# Patient Record
Sex: Female | Born: 1935
Health system: Southern US, Community
[De-identification: ages and names within clinical notes are randomized; demographics above are authoritative.]

## PROBLEM LIST (undated history)

## (undated) DIAGNOSIS — F419 Anxiety disorder, unspecified: Secondary | ICD-10-CM

## (undated) DIAGNOSIS — E049 Nontoxic goiter, unspecified: Secondary | ICD-10-CM

## (undated) DIAGNOSIS — K219 Gastro-esophageal reflux disease without esophagitis: Secondary | ICD-10-CM

## (undated) DIAGNOSIS — M810 Age-related osteoporosis without current pathological fracture: Secondary | ICD-10-CM

## (undated) DIAGNOSIS — E559 Vitamin D deficiency, unspecified: Secondary | ICD-10-CM

## (undated) DIAGNOSIS — I10 Essential (primary) hypertension: Secondary | ICD-10-CM

## (undated) DIAGNOSIS — M1990 Unspecified osteoarthritis, unspecified site: Secondary | ICD-10-CM

## (undated) DIAGNOSIS — F0390 Unspecified dementia without behavioral disturbance: Secondary | ICD-10-CM

## (undated) HISTORY — PX: APPENDECTOMY: SHX54

## (undated) HISTORY — PX: TUBAL LIGATION: SHX77

## (undated) HISTORY — PX: OTHER SURGICAL HISTORY: SHX169

## (undated) HISTORY — PX: KIDNEY STONE SURGERY: SHX686

---

## 2000-10-20 ENCOUNTER — Ambulatory Visit (HOSPITAL_COMMUNITY): Admission: RE | Admit: 2000-10-20 | Discharge: 2000-10-20 | Payer: Self-pay | Admitting: Internal Medicine

## 2000-10-20 ENCOUNTER — Encounter: Payer: Self-pay | Admitting: Internal Medicine

## 2001-04-13 ENCOUNTER — Encounter: Payer: Self-pay | Admitting: Internal Medicine

## 2001-04-13 ENCOUNTER — Ambulatory Visit (HOSPITAL_COMMUNITY): Admission: RE | Admit: 2001-04-13 | Discharge: 2001-04-13 | Payer: Self-pay | Admitting: Internal Medicine

## 2002-06-08 ENCOUNTER — Encounter: Payer: Self-pay | Admitting: Internal Medicine

## 2002-06-08 ENCOUNTER — Ambulatory Visit (HOSPITAL_COMMUNITY): Admission: RE | Admit: 2002-06-08 | Discharge: 2002-06-08 | Payer: Self-pay | Admitting: Internal Medicine

## 2003-08-12 ENCOUNTER — Ambulatory Visit (HOSPITAL_COMMUNITY): Admission: RE | Admit: 2003-08-12 | Discharge: 2003-08-12 | Payer: Self-pay | Admitting: Internal Medicine

## 2003-12-20 ENCOUNTER — Ambulatory Visit: Payer: Self-pay | Admitting: Internal Medicine

## 2003-12-20 ENCOUNTER — Ambulatory Visit (HOSPITAL_COMMUNITY): Admission: RE | Admit: 2003-12-20 | Discharge: 2003-12-20 | Payer: Self-pay | Admitting: Internal Medicine

## 2004-08-21 ENCOUNTER — Ambulatory Visit (HOSPITAL_COMMUNITY): Admission: RE | Admit: 2004-08-21 | Discharge: 2004-08-21 | Payer: Self-pay | Admitting: Internal Medicine

## 2005-09-19 ENCOUNTER — Ambulatory Visit (HOSPITAL_COMMUNITY): Admission: RE | Admit: 2005-09-19 | Discharge: 2005-09-19 | Payer: Self-pay | Admitting: Internal Medicine

## 2005-12-04 ENCOUNTER — Ambulatory Visit (HOSPITAL_COMMUNITY): Admission: RE | Admit: 2005-12-04 | Discharge: 2005-12-04 | Payer: Self-pay | Admitting: Internal Medicine

## 2006-09-24 ENCOUNTER — Ambulatory Visit (HOSPITAL_COMMUNITY): Admission: RE | Admit: 2006-09-24 | Discharge: 2006-09-24 | Payer: Self-pay | Admitting: Internal Medicine

## 2007-10-09 ENCOUNTER — Ambulatory Visit (HOSPITAL_COMMUNITY): Admission: RE | Admit: 2007-10-09 | Discharge: 2007-10-09 | Payer: Self-pay | Admitting: Internal Medicine

## 2008-02-17 ENCOUNTER — Ambulatory Visit (HOSPITAL_COMMUNITY): Admission: RE | Admit: 2008-02-17 | Discharge: 2008-02-17 | Payer: Self-pay | Admitting: Internal Medicine

## 2008-10-01 ENCOUNTER — Inpatient Hospital Stay (HOSPITAL_COMMUNITY): Admission: EM | Admit: 2008-10-01 | Discharge: 2008-10-03 | Payer: Self-pay | Admitting: Emergency Medicine

## 2008-10-11 ENCOUNTER — Ambulatory Visit (HOSPITAL_COMMUNITY): Admission: RE | Admit: 2008-10-11 | Discharge: 2008-10-11 | Payer: Self-pay | Admitting: Internal Medicine

## 2008-10-17 ENCOUNTER — Ambulatory Visit (HOSPITAL_COMMUNITY): Admission: RE | Admit: 2008-10-17 | Discharge: 2008-10-17 | Payer: Self-pay | Admitting: Internal Medicine

## 2008-11-02 ENCOUNTER — Encounter: Admission: RE | Admit: 2008-11-02 | Discharge: 2008-11-02 | Payer: Self-pay | Admitting: Internal Medicine

## 2008-11-02 ENCOUNTER — Other Ambulatory Visit: Admission: RE | Admit: 2008-11-02 | Discharge: 2008-11-02 | Payer: Self-pay | Admitting: Interventional Radiology

## 2008-11-02 ENCOUNTER — Encounter (INDEPENDENT_AMBULATORY_CARE_PROVIDER_SITE_OTHER): Payer: Self-pay | Admitting: Interventional Radiology

## 2009-04-13 ENCOUNTER — Ambulatory Visit (HOSPITAL_COMMUNITY): Admission: RE | Admit: 2009-04-13 | Discharge: 2009-04-13 | Payer: Self-pay | Admitting: Internal Medicine

## 2009-09-13 ENCOUNTER — Ambulatory Visit (HOSPITAL_COMMUNITY): Admission: RE | Admit: 2009-09-13 | Discharge: 2009-09-13 | Payer: Self-pay | Admitting: Internal Medicine

## 2009-10-23 ENCOUNTER — Ambulatory Visit (HOSPITAL_COMMUNITY): Admission: RE | Admit: 2009-10-23 | Discharge: 2009-10-23 | Payer: Self-pay | Admitting: Internal Medicine

## 2009-12-25 ENCOUNTER — Ambulatory Visit (HOSPITAL_COMMUNITY): Admission: RE | Admit: 2009-12-25 | Discharge: 2009-12-25 | Payer: Self-pay | Admitting: Internal Medicine

## 2010-03-24 ENCOUNTER — Emergency Department (HOSPITAL_COMMUNITY): Payer: Medicare Other

## 2010-03-24 ENCOUNTER — Emergency Department (HOSPITAL_COMMUNITY)
Admission: EM | Admit: 2010-03-24 | Discharge: 2010-03-24 | Disposition: A | Discharge status: Home / Self Care | Payer: Medicare Other | Attending: Emergency Medicine | Admitting: Emergency Medicine

## 2010-03-24 DIAGNOSIS — H5789 Other specified disorders of eye and adnexa: Secondary | ICD-10-CM | POA: Insufficient documentation

## 2010-03-24 DIAGNOSIS — Z79899 Other long term (current) drug therapy: Secondary | ICD-10-CM | POA: Insufficient documentation

## 2010-03-24 DIAGNOSIS — H1130 Conjunctival hemorrhage, unspecified eye: Secondary | ICD-10-CM | POA: Insufficient documentation

## 2010-03-24 DIAGNOSIS — I10 Essential (primary) hypertension: Secondary | ICD-10-CM | POA: Insufficient documentation

## 2010-03-24 DIAGNOSIS — M810 Age-related osteoporosis without current pathological fracture: Secondary | ICD-10-CM | POA: Insufficient documentation

## 2010-03-24 DIAGNOSIS — R350 Frequency of micturition: Secondary | ICD-10-CM | POA: Insufficient documentation

## 2010-03-24 DIAGNOSIS — R42 Dizziness and giddiness: Secondary | ICD-10-CM | POA: Insufficient documentation

## 2010-03-24 LAB — URINALYSIS, ROUTINE W REFLEX MICROSCOPIC
Bilirubin Urine: NEGATIVE
Leukocytes, UA: NEGATIVE
Protein, ur: NEGATIVE mg/dL
Specific Gravity, Urine: 1.01 (ref 1.005–1.030)
pH: 6.5 (ref 5.0–8.0)

## 2010-05-12 LAB — CBC
HCT: 38.1 % (ref 36.0–46.0)
Platelets: 141 10*3/uL — ABNORMAL LOW (ref 150–400)

## 2010-05-12 LAB — BASIC METABOLIC PANEL
BUN: 11 mg/dL (ref 6–23)
Calcium: 8.6 mg/dL (ref 8.4–10.5)
Chloride: 106 mEq/L (ref 96–112)
GFR calc Af Amer: >60 mL/min (ref >60)
Glucose, Bld: 122 mg/dL — ABNORMAL HIGH (ref 70–99)
Potassium: 3.6 mEq/L (ref 3.5–5.1)
Sodium: 140 mEq/L (ref 135–145)

## 2010-05-12 LAB — URINALYSIS, ROUTINE W REFLEX MICROSCOPIC
Bilirubin Urine: NEGATIVE
Ketones, ur: NEGATIVE mg/dL
Nitrite: NEGATIVE
Protein, ur: NEGATIVE mg/dL

## 2010-05-12 LAB — DIFFERENTIAL
Basophils Absolute: 0 10*3/uL (ref 0.0–0.1)
Basophils Relative: 0 % (ref 0–1)
Lymphs Abs: 0.6 10*3/uL — ABNORMAL LOW (ref 0.7–4.0)
Monocytes Relative: 7 % (ref 3–12)
Neutrophils Relative %: 85 % — ABNORMAL HIGH (ref 43–77)

## 2010-05-12 LAB — POCT CARDIAC MARKERS
CKMB, poc: <1 ng/mL — ABNORMAL LOW (ref 1.0–8.0)
Myoglobin, poc: 72.4 ng/mL (ref 12–200)

## 2010-05-12 LAB — THYROID ANTIBODIES: Thyroglobulin Ab: 31.2 U/mL (ref 0.0–60.0)

## 2010-06-19 NOTE — Discharge Summary (Signed)
NAMEDALAYLA, ALDREDGE                 ACCOUNT NO.:  [id number]   MEDICAL RECORD NO.:  [id number]          PATIENT TYPE:  INP   LOCATION:  A202                          FACILITY:  [facility]   PHYSICIAN:  Kingsley Callander. [name], MD       DATE OF BIRTH:  February 28, 1928   DATE OF ADMISSION:  10/01/2008  DATE OF DISCHARGE:  08/30/2010LH                               DISCHARGE SUMMARY   DISCHARGE DIAGNOSES:  1. Vertigo.  2. Hypertension.  3. Osteoporosis.  4. Gastroesophageal reflux disease.  5. Hyperthyroidism/thyroid nodule.   PROCEDURES:  Head CT scan, MRI of the brain, carotid ultrasound.   HOSPITAL COURSE:  This patient is a 77 year old white female who  presented to the emergency room with nausea and vertigo.  She had  reportedly had slurred speech at home as well.  She had felt a spinning  like dizziness.  She had a low grade fever initially.  A CT scan of the  brain revealed no evidence of hemorrhage.  She was hospitalized for  observation.  She had no slurred speech noted here.  She had no  weakness.  Her balance improved during her hospital stay, she was back  to her baseline.  An MRI of the brain revealed small vessel changes but  no sign of acute infarct.  A carotid ultrasound revealed no  hemodynamically significant stenosis.  However, she did have a small  thyroid nodule.  Her TSH was suppressed and her TSH was suppressed at  0.181.   Her hemoglobin was 13.4.  White count was 8.9 and creatinine was 0.83.  Cardiac markers were initially negative.   Her dizziness and nausea resolved.  She was improved and stable for  discharge on the evening of the 30th.  She will have followup in my  office in 1 week.  A low dose aspirin was added to her medication  regimen.  Antithyroid antibodies have been ordered.   She will have further assessment of her thyroid with a dedicated thyroid  ultrasound.   DISCHARGE MEDICATIONS:  Aspirin 81 mg daily, Evista 60 mg daily,  Lotensin 10 mg daily, vitamin D  daily, Capadex 60 mg daily, clorazepate  7.5 mg daily p.r.n. Atarax 25 mg every 4 hours p.r.n. for itching.      Kingsley Callander. [name], MD  Electronically Signed     ROF/MEDQ  D:  10/03/2008  T:  10/04/2008  Job:  [id number]

## 2010-06-19 NOTE — H&P (Signed)
[name], [name]                 ACCOUNT NO.:  [id number]   MEDICAL RECORD NO.:  [id number]          PATIENT TYPE:  INP   LOCATION:  A202                          FACILITY:  [facility]   PHYSICIAN:  Kingsley Callander. [name], MD       DATE OF BIRTH:  1928-07-06   DATE OF ADMISSION:  10/01/2008  DATE OF DISCHARGE:  LH                              HISTORY & PHYSICAL   CHIEF COMPLAINT:  Vertigo.   HISTORY OF PRESENT ILLNESS:  This patient is a 77 year old white female  with a history of hypertension, who presented to the emergency room  after she developed nausea and vertigo.  She did not vomit.  Her family  felt she had slurred speech.  She suffered no loss of consciousness.  She states she felt a spinning like sensation and felt off balance.  She  felt her vision became abnormal temporarily also.  She has had a mild  frontal headache.  She had a low-grade temperature elevation at 99.7  last night.  She denies subjective fever or chills.  She has not had  abdominal pain or diarrhea.   PAST MEDICAL HISTORY:  1. Hypertension.  2. Osteoporosis.  3. Vitamin D deficiency.  4. Kidney stones.  5. Normal cardiac catheterization in 1998.  6. Femoral stress fracture in 2003.  7. Laparotomy for salpingitis and an appendectomy early in life.  8. GERD.  9. Anxiety.  10.Right bundle-branch block.  11.Probable thyroglossal duct cyst.   MEDICATIONS:  1. Lotensin 10 mg daily.  2. Evista 60 mg daily.  3. Vitamin D daily.  4. Kapidex 60 mg daily.  5. Tranxene 7.5 mg t.i.d. p.r.n.   ALLERGIES:  None.   SOCIAL HISTORY:  She does not use tobacco, alcohol, or drugs.   FAMILY HISTORY:  Father had heart failure.  Mother had a stroke.  Sisters had heart disease.  Sisters had dementia.  Brothers had a brain  tumor.   REVIEW OF SYSTEMS:  Negative.   PHYSICAL EXAMINATION:  VITAL SIGNS:  Maximum temperature 99.7.  Vital  signs are normal.  GENERAL:  Alert and now in no distress.  HEENT:  Pupils equal, round,  reactive to light.  No scleral icterus.  Pharynx moist.  NECK:  Supple.  There is a stable midline neck mass.  No JVD,  thyromegaly, or bruit.  LUNGS:  Clear.  HEART:  Regular with no murmurs.  ABDOMEN:  Nontender.  No hepatosplenomegaly.  EXTREMITIES:  No cyanosis, clubbing, or edema.  NEUROLOGIC:  She is able to stand without difficulty.  Romberg is  negative.  Finger-to-nose testing is normal.  Her strength is normal  throughout.  Speech is intact.  LYMPH NODES:  No cervical or supraclavicular enlargement.  SKIN:  Warm and dry.   LABORATORY DATA:  CT scan of brain revealed small vessel changes, but no  sign of acute abnormality.  Blood count, chemistries, and cardiac  enzymes are unremarkable.   IMPRESSION:  1. Acute onset of vertigo with imbalance and questionable speech      changes.  She seems to be at  her baseline neurologically, now with      no focal deficits.  We will evaluate further with an MRI of the      brain, treat with aspirin and p.r.n. Zofran.  2. Hypertension.  Continue Lotensin.  3. Osteoporosis.  We will hold Evista while hospitalized.  4. Anxiety.  5. Gastroesophageal reflux disease.  6. History vitamin D deficiency.      Kingsley Callander. [name], MD  Electronically Signed     ROF/MEDQ  D:  10/02/2008  T:  10/03/2008  Job:  [id number]

## 2010-06-22 NOTE — Op Note (Signed)
[name], [name]                 ACCOUNT NO.:  [id number]   MEDICAL RECORD NO.:  [id number]          PATIENT TYPE:  AMB   LOCATION:  DAY                           FACILITY:  [facility]   PHYSICIAN:  Lionel December, M.D.    DATE OF BIRTH:  11/16/1928   DATE OF PROCEDURE:  12/20/2003  DATE OF DISCHARGE:                                 OPERATIVE REPORT   PROCEDURE:  Screening colonoscopy.   INDICATIONS:  [name] is a 77 year old Caucasian female who is here for  screening colonoscopy. Family history is negative for colorectal carcinoma.  Procedure and risks were reviewed with the patient and informed consent was  obtained.   PREOPERATIVE MEDICATIONS:  Demerol 25 mg IV, Versed 5 mg IV in divided  doses.   FINDINGS:  Procedure performed in endoscopy suite. The patient's vital signs  and O2 saturations were monitored during procedure and remained stable. The  patient was placed in the left lateral position and rectal examination  performed. No abnormality noted on external or digital exam. Olympus video  scope was placed in the rectum and advanced into sigmoid colon and beyond.  Preparation was satisfactory. Two tiny diverticula were noted at proximal  sigmoid colon. Scope was advanced to the cecum which was identified by  appendiceal stump and ileocecal valve. Picture taken for the record. As the  scope was withdrawn, colonic mucosa was once again carefully examined. No  polyps, tumors, masses, or other abnormalities were noted. The rectal mucosa  similarly was normal. Scope was retroflexed to examine anorectal junction,  and small hemorrhoids below the dentate line. The scope was straightened and  withdrawn. The patient tolerated the procedure well.   FINAL DIAGNOSIS:  Two tiny diverticula at the sigmoid colon and small  external hemorrhoids. Otherwise normal colonoscopy.   RECOMMENDATIONS:  1.  High-fiber diet.  2.  She should continue yearly hemoccults and may consider next screening     exam in 10 years from now.     Naje  NR/MEDQ  D:  12/20/2003  T:  12/20/2003  Job:  [id number]   cc:   Kingsley Callander. [name], MD  [address]  [place]  [place] [address]  Fax: [phone]

## 2010-10-09 ENCOUNTER — Other Ambulatory Visit (HOSPITAL_COMMUNITY): Payer: Self-pay | Admitting: Internal Medicine

## 2010-10-09 DIAGNOSIS — Z139 Encounter for screening, unspecified: Secondary | ICD-10-CM

## 2010-10-25 ENCOUNTER — Ambulatory Visit (HOSPITAL_COMMUNITY): Payer: Medicare Other

## 2010-10-25 ENCOUNTER — Ambulatory Visit (HOSPITAL_COMMUNITY)
Admission: RE | Admit: 2010-10-25 | Discharge: 2010-10-25 | Disposition: A | Discharge status: Home / Self Care | Payer: Medicare Other | Source: Ambulatory Visit | Attending: Internal Medicine | Admitting: Internal Medicine

## 2010-10-25 DIAGNOSIS — Z1231 Encounter for screening mammogram for malignant neoplasm of breast: Secondary | ICD-10-CM | POA: Insufficient documentation

## 2010-10-25 DIAGNOSIS — Z139 Encounter for screening, unspecified: Secondary | ICD-10-CM

## 2011-03-08 ENCOUNTER — Encounter (HOSPITAL_COMMUNITY): Payer: Self-pay | Admitting: Emergency Medicine

## 2011-03-08 ENCOUNTER — Emergency Department (HOSPITAL_COMMUNITY)
Admission: EM | Admit: 2011-03-08 | Discharge: 2011-03-08 | Disposition: A | Discharge status: Home / Self Care | Payer: Medicare Other | Attending: Emergency Medicine | Admitting: Emergency Medicine

## 2011-03-08 DIAGNOSIS — W540XXA Bitten by dog, initial encounter: Secondary | ICD-10-CM | POA: Insufficient documentation

## 2011-03-08 DIAGNOSIS — S61409A Unspecified open wound of unspecified hand, initial encounter: Secondary | ICD-10-CM | POA: Insufficient documentation

## 2011-03-08 DIAGNOSIS — IMO0002 Reserved for concepts with insufficient information to code with codable children: Secondary | ICD-10-CM

## 2011-03-08 DIAGNOSIS — Y92009 Unspecified place in unspecified non-institutional (private) residence as the place of occurrence of the external cause: Secondary | ICD-10-CM | POA: Insufficient documentation

## 2011-03-08 DIAGNOSIS — I10 Essential (primary) hypertension: Secondary | ICD-10-CM | POA: Insufficient documentation

## 2011-03-08 HISTORY — DX: Unspecified osteoarthritis, unspecified site: M19.90

## 2011-03-08 HISTORY — DX: Essential (primary) hypertension: I10

## 2011-03-08 MED ORDER — AMOXICILLIN-POT CLAVULANATE 875-125 MG PO TABS: 1.0000 | ORAL_TABLET | Freq: Two times a day (BID) | ORAL | Status: AC

## 2011-03-08 MED ORDER — BACITRACIN ZINC 500 UNIT/GM EX OINT
TOPICAL_OINTMENT | CUTANEOUS | Status: AC
  Administered 2011-03-08: 10:00:00
  Filled 2011-03-08: qty 0.9

## 2011-03-08 MED ORDER — TETANUS-DIPHTH-ACELL PERTUSSIS 5-2.5-18.5 LF-MCG/0.5 IM SUSP
0.5000 mL | Freq: Once | INTRAMUSCULAR | Status: AC
  Administered 2011-03-08: 0.5 mL via INTRAMUSCULAR
  Filled 2011-03-08: qty 0.5

## 2011-03-08 MED ORDER — LIDOCAINE HCL (PF) 1 % IJ SOLN
5.0000 mL | Freq: Once | INTRAMUSCULAR | Status: AC
  Administered 2011-03-08: 5 mL
  Filled 2011-03-08: qty 5

## 2011-03-08 NOTE — ED Notes (Signed)
Pt has left hand lac from dog bite. Pt states the dog was playing, not attacking her. Pt not sure when last tetanus was or the dogs last rabies injection was.

## 2011-03-08 NOTE — ED Provider Notes (Signed)
History     CSN: [id number]  Arrival date & time 03/08/11  0826   First MD Initiated Contact with Patient 03/08/11 [phone]      Chief Complaint  Patient presents with  . Animal Bite    (Consider location/radiation/quality/duration/timing/severity/associated sxs/prior treatment) HPI Comments: Patient c/o laceration to her dorsal left hand that occurred when her dog accidentally caused a laceration to her hand while she was "fixing the blanket in his bed".  She states the injury was an accident and the dog was not trying to bite her.  She also states the dog is healthy but not up to date on his vaccines.  She denies decreased movement or numbness of the hand.    Patient is a 78 y.o. female presenting with animal bite. The history is provided by the patient. No language interpreter was used.  Animal Bite  The incident occurred just prior to arrival. The incident occurred at home. She came to the ER via personal transport. There is an injury to the left hand. The pain is mild. It is unlikely that a foreign body is present. Pertinent negatives include no numbness, no nausea, no vomiting, no inability to bear weight, no neck pain, no focal weakness, no loss of consciousness, no tingling, no weakness and no difficulty breathing. There have been no prior injuries to these areas. She is right-handed. Her tetanus status is unknown. There were no sick contacts. She has received no recent medical care.    Past Medical History  Diagnosis Date  . Hypertension   . Osteoarthritis     Past Surgical History  Procedure Date  . Kidney stone surgery     History reviewed. No pertinent family history.  History  Substance Use Topics  . Smoking status: Not on file  . Smokeless tobacco: Not on file  . Alcohol Use: No    OB History    Grav Para Term Preterm Abortions TAB SAB Ect Mult Living                  Review of Systems  HENT: Negative for neck pain.   Gastrointestinal: Negative for nausea  and vomiting.  Musculoskeletal: Negative for arthralgias.  Skin: Positive for wound. Negative for color change.  Neurological: Negative for dizziness, tingling, focal weakness, loss of consciousness, weakness and numbness.  Hematological: Does not bruise/bleed easily.  All other systems reviewed and are negative.    Allergies  Review of patient's allergies indicates no known allergies.  Home Medications   Current Outpatient Rx  Name Route Sig Dispense Refill  . AMLODIPINE BESYLATE 5 MG PO TABS Oral Take 5 mg by mouth every evening.    Marland Kitchen BENAZEPRIL HCL 20 MG PO TABS Oral Take 20 mg by mouth every morning.    Marland Kitchen VITAMIN D 1000 UNITS PO TABS Oral Take 2,000 Units by mouth daily.    Marland Kitchen CLORAZEPATE DIPOTASSIUM 7.5 MG PO TABS Oral Take 7.5 mg by mouth at bedtime as needed. For sleep      BP 168/90  Pulse 78  Temp(Src) 98 F (36.7 C) (Oral)  Resp 18  Ht 5\' 2"  (1.575 m)  Wt 136 lb (61.689 kg)  BMI 24.87 kg/m2  SpO2 95%  Physical Exam  Nursing note and vitals reviewed. Constitutional: She is oriented to person, place, and time. She appears well-developed and well-nourished. No distress.  HENT:  Head: Normocephalic and atraumatic.  Cardiovascular: Normal rate, regular rhythm and normal heart sounds.   Pulmonary/Chest: Effort normal and  breath sounds normal.  Musculoskeletal: She exhibits tenderness. She exhibits no edema.       Left hand: She exhibits tenderness and laceration. She exhibits normal range of motion, no bony tenderness, normal two-point discrimination, normal capillary refill, no deformity and no swelling. normal sensation noted. Normal strength noted.       Hands:      Laceration. Bleeding controlled. No muscle, nerve or tendon injury seen.  Neurological: She is alert and oriented to person, place, and time. She exhibits normal muscle tone. Coordination normal.  Skin: Skin is warm and dry.       laceration    ED Course  Procedures (including critical care  time)   LACERATION REPAIR Performed by: Seba Madole L. Authorized by: Maxwell Caul Consent: Verbal consent obtained. Risks and benefits: risks, benefits and alternatives were discussed Consent given by: patient Patient identity confirmed: provided demographic data Prepped and Draped in normal sterile fashion Wound explored  Laceration Location: dorsal left hand Laceration Length: 4cm  No Foreign Bodies seen or palpated  Anesthesia: local infiltration  Local anesthetic: lidocaine 1% w/oepinephrine  Anesthetic total: 3ml  Irrigation method: syringe Amount of cleaning: standard  Skin closure: 4-0 prolene with loose approximation Number of sutures:3  Technique: simple interrupted Patient tolerance: Patient tolerated the procedure well with no immediate complications.     MDM    Patient has full ROM if the left hand and fingers.  No muscle, tendon or nerve injury seen.  Wound(s) explored with adequate hemostasis through ROM, no apparent gross foreign body retained, no significant involvement of deep structures such as bone / joint / tendon / or neurovascular involvement noted.  Baseline Strength and Sensation to affected extremity(ies) with normal light touch for Pt, distal NVI with CR< 2 secs and pulse(s) intact to affected extremity(ies).   Patient agrees to return here in 2 days for recheck.  Patient is owner of the dog, she agrees to close observation of the dog and to return here for rabies series if the dog develops any concerning symtpoms Patient's care plan was discussed with the edp prior to d/c       Maurene Hollin L. [place], [place] 03/09/11 2026

## 2011-03-08 NOTE — ED Notes (Addendum)
Lac to the top of the pt's left hand.  Pt reports that "it hurts to the bone".  Bleeding is controlled.  Good pulses and cap refill.  nad noted

## 2011-03-14 NOTE — ED Provider Notes (Signed)
Medical screening examination/treatment/procedure(s) were conducted as a shared visit with non-physician practitioner(s) and myself.  I personally evaluated the patient during the encounter Inadvertent lac to hand from dogs mouth. Animal acting normally/healthy, was playing. Animal will be observed. Tetanus, augmentin, obs animal.   Suzi Roots, MD 03/14/11 [phone]

## 2011-09-09 ENCOUNTER — Ambulatory Visit (HOSPITAL_COMMUNITY)
Admission: RE | Admit: 2011-09-09 | Discharge: 2011-09-09 | Disposition: A | Discharge status: Home / Self Care | Payer: Medicare Other | Source: Ambulatory Visit | Attending: Internal Medicine | Admitting: Internal Medicine

## 2011-09-09 DIAGNOSIS — IMO0002 Reserved for concepts with insufficient information to code with codable children: Secondary | ICD-10-CM | POA: Insufficient documentation

## 2011-09-09 DIAGNOSIS — R262 Difficulty in walking, not elsewhere classified: Secondary | ICD-10-CM | POA: Insufficient documentation

## 2011-09-09 DIAGNOSIS — M25569 Pain in unspecified knee: Secondary | ICD-10-CM | POA: Insufficient documentation

## 2011-09-09 DIAGNOSIS — M79609 Pain in unspecified limb: Secondary | ICD-10-CM | POA: Insufficient documentation

## 2011-09-09 DIAGNOSIS — M6281 Muscle weakness (generalized): Secondary | ICD-10-CM | POA: Insufficient documentation

## 2011-09-09 DIAGNOSIS — R269 Unspecified abnormalities of gait and mobility: Secondary | ICD-10-CM | POA: Insufficient documentation

## 2011-09-09 DIAGNOSIS — IMO0001 Reserved for inherently not codable concepts without codable children: Secondary | ICD-10-CM | POA: Insufficient documentation

## 2011-09-09 NOTE — Evaluation (Addendum)
Physical Therapy Evaluation  Patient Details  Name: [name] MRN: [id number] Date of Birth: 06-25-1928  Today's Date: 09/09/2011 Time: 2536-6440 PT Time Calculation (min): 48 min  Visit#: 1  of 12  (Recommended, pt able to complete 2x/wk)  Re-eval: 10/09/11 Assessment Diagnosis: OA/difficulty walking Next MD Visit: Dr. [name] - 10/07/11  Past Medical History:  Past Medical History  Diagnosis Date  . Hypertension   . Osteoarthritis    Past Surgical History:  Past Surgical History  Procedure Date  . Kidney stone surgery    Subjective Symptoms/Limitations Symptoms: OA, has pain in her L side of her face (ear pain and jaw pain), HTN, anxiety  Pertinent History: Pt is referred to PT for B LE OA, that she states has been progressively worsening over the past year.  She reports that she has been walking with her [facility] for about 1 year.  She is taking tylenol during the day and night to control her pain and places ice packs on her legs. She has a hx of stress fractures in her R mid-tibia and hips (BLE).  She has seen an orthopedist in [place] who recommended a hip replacement, but she decided against it.  Her c/co's are: LE pain, decreased activity tolerance, improper posture and gait mechanics.   How long can you stand comfortably?: less than 5 minutes How long can you walk comfortably?: able to complete household activities 2-3 hours before having increased fatigue.  Patient Stated Goals: "I want to be able to walk straight and without a limp."  Pain Assessment Currently in Pain?: Yes Pain Score:  (0-9/10) Pain Location: Leg Pain Orientation: Right Pain Type: Acute pain;Chronic pain Pain Relieving Factors: Ice and Tyelonol Effect of Pain on Daily Activities: She is unable to bring in her groceries and dog food.  She is not able to go up her stairs at home.   Prior Functioning  Home Living Lives With: Alone (Husband is at [employer] - she visits 3x/week) Type of Home: House Home  Access: Stairs to enter [employer] of Steps: 1 Home Layout: Two level Alternate Level Stairs-Number of Steps: 15 Prior Function Level of Independence: Requires assistive device for independence Driving: Yes Comments: She enjoys going to visit her husband and stays busy with housework.   Cognition/Observation Observation/Other Assessments Observations: increased R femur length w/visual insections. Favors RLE w/STS activities. increased R gluteal atrophy  Sensation/Coordination/Flexibility/Functional Tests Functional Tests Functional Tests: 5 STS: 42 sec Functional Tests: LEFS: 28/80  Assessment RLE Strength Right Hip Flexion: 4/5 (w/pain) Right Hip Extension: 3+/5 Right Hip ABduction: 4/5 Right Knee Flexion: 4/5 Right Knee Extension: 4/5 Right Ankle Dorsiflexion: 5/5 LLE Strength Left Hip Flexion: 4/5 Left Hip Extension: 3/5 Left Hip ABduction: 4/5 Left Knee Flexion: 3+/5 Left Knee Extension: 4/5 Left Ankle Dorsiflexion: 5/5  Mobility/Balance  Ambulation/Gait Ambulation/Gait: Yes Ambulation/Gait Assistance: 6: Modified independent (Device/Increase time) Assistive device: Straight cane Gait Pattern: Trunk flexed;Decreased trunk rotation;Antalgic;Trendelenburg;Right foot flat Static Standing Balance Single Leg Stance - Right Leg: 0  Single Leg Stance - Left Leg: 0  Tandem Stance - Right Leg: 10  Tandem Stance - Left Leg: 8  Rhomberg - Eyes Opened: 10  Rhomberg - Eyes Closed: 10  Timed Up and Go Test TUG: Normal TUG Normal TUG (seconds): 35  (independent)   Exercise/Treatments Standing Heel Raises: 10 reps;Limitations (HEP) Heel Raises Limitations: Toe Raises 10x (HEP) Functional Squat: 10 reps (HEP) Supine Straight Leg Raises: Both;10 reps (HEP) Prone  Hip Extension: Both;10 reps (HEP)  Physical Therapy Assessment and Plan PT Assessment and Plan Clinical Impression Statement: Ms. Wos was referred to PT for OA/difficulty walking which she  reports is effecting her QOL.  Pt will benefit from skilled therapeutic intervention in order to improve on the following deficits: Abnormal gait;Difficulty walking;Decreased activity tolerance;Decreased strength;Pain;Impaired perceived functional ability;Decreased balance Rehab Potential: Good PT Frequency: Min 3X/week PT Duration: 4 weeks PT Treatment/Interventions: Gait training;Stair training;Functional mobility training;Therapeutic activities;Therapeutic exercise;Balance training;Neuromuscular re-education;Patient/family education;Modalities (modalities PRN for pain control ) PT Plan: Please address gluteal strength, balance, activity tolerance.  Add: bike, standing hip extension/adduction/abduction, Tandem stance, SLS, stair exercises, rocker board, and bed exercises as able.    Goals Home Exercise Program Pt will Perform Home Exercise Program: Independently PT Goal: Perform Home Exercise Program - Progress: Goal set today PT Short Term Goals Time to Complete Short Term Goals: 2 weeks PT Short Term Goal 1: Pt will imporve gait mechanics and ambulate with improved gait mechanics with SPC.  PT Short Term Goal 2: Pt will improve her strength by 1 muscle grade. PT Short Term Goal 3: Pt will improve static standing balance and demonstrate R and L SLS x5 sec on solid surface.  PT Long Term Goals Time to Complete Long Term Goals: 4 weeks PT Long Term Goal 1: Pt will improve her LEFS to 40/80 for improved percieved functional ability.  PT Long Term Goal 2: Pt will improve gait mechanics and walk independently x10 minutes in a closed environment w/min cueing for proper gait mechanics and posture.  Long Term Goal 3: Pt will improve LE strength in order to tolerate standing for 15 minutes in order to complete necessary ADL's and improve QOL Long Term Goal 4: Pt will improve indepdent TUG to less than or eqaul to 20 sec in order to decrease fall risk in the community.  PT Long Term Goal 5: Pt will  improve functional strength and perform 5 STS to 17.2 secand/or 9-14 STS in 30 seconds.   Problem List Patient Active Problem List  Diagnosis  . Difficulty in walking  . Osteoarthritis of leg  . Abnormality of gait  . Pain in joint, lower leg    PT - End of Session Activity Tolerance: Patient tolerated treatment well PT Plan of Care PT Home Exercise Plan: see scanned report PT Patient Instructions: Educated to purchase a 1/4 in shoe lift or an OTC insert to place in LLE shoe to even leg length discrepency.  Consulted and Agree with Plan of Care: Patient GP: Mobility:  Current: CL, Goal: CI Mahala Rommel, PT 09/09/2011, 3:07 PM  Physician Documentation Your signature is required to indicate approval of the treatment plan as stated above.  Please sign and either send electronically or make a copy of this report for your files and return this physician signed original.   Please mark one 1.__approve of plan  2. ___approve of plan with the following conditions.   ______________________________                                                          _____________________ Physician Signature  Date  

## 2011-09-12 ENCOUNTER — Ambulatory Visit (HOSPITAL_COMMUNITY)
Admission: RE | Admit: 2011-09-12 | Discharge: 2011-09-12 | Disposition: A | Discharge status: Home / Self Care | Payer: Medicare Other | Source: Ambulatory Visit | Attending: Internal Medicine | Admitting: Internal Medicine

## 2011-09-12 NOTE — Progress Notes (Signed)
Physical Therapy Treatment Patient Details  Name: [name] MRN: [id number] Date of Birth: 10/09/1928  Today's Date: 09/12/2011 Time: 6295-2841 PT Time Calculation (min): 44 min Visit#: 2  of 12   Re-eval: 10/09/11 Charges:  therex 42'    Subjective: Symptoms/Limitations Symptoms: Pt. states she is about the same today.  Reports compliance with her HEP and has no questions. Pain Assessment Currently in Pain?: Yes Pain Score:   5 Pain Location: Leg Pain Orientation: Right   Exercise/Treatments Standing Heel Raises: 10 reps Heel Raises Limitations: Toe Raises 10x Functional Squat: 10 reps Rocker Board: 2 minutes SLS: max of 2 R:5", L: 9" Other Standing Knee Exercises: Hip Abduction, flexion and extension 10 reps each bilaterally Other Standing Knee Exercises: tandem stance 30" each Supine Bridges: 10 reps;3 sets Straight Leg Raises: 10 reps;Both Prone  Hamstring Curl: 10 reps Hip Extension: 10 reps;Both      Physical Therapy Assessment and Plan PT Assessment and Plan Clinical Impression Statement: Added new activities per flow sheet without difficulty.  Required great deal of tactile cues with rockerboard, esp. to the left.  Unable to SLS greater than 10 seconds.  Pt. able to perform exericises with good speed/control. PT Plan: Continue to progress strength and stability.  Add step ups and bike next visit.     Problem List Patient Active Problem List  Diagnosis  . Difficulty in walking  . Osteoarthritis of leg  . Abnormality of gait  . Pain in joint, lower leg    PT - End of Session Activity Tolerance: Patient tolerated treatment well General Behavior During Session: [facility] for tasks performed Cognition: [facility] for tasks performed    Lurena Nida, PTA/CLT 09/12/2011, 3:21 PM

## 2011-09-17 ENCOUNTER — Ambulatory Visit (HOSPITAL_COMMUNITY): Payer: Medicare Other | Admitting: Physical Therapy

## 2011-09-19 ENCOUNTER — Ambulatory Visit (HOSPITAL_COMMUNITY): Payer: Medicare Other | Admitting: Physical Therapy

## 2011-09-24 ENCOUNTER — Inpatient Hospital Stay (HOSPITAL_COMMUNITY): Admission: RE | Admit: 2011-09-24 | Payer: Medicare Other | Source: Ambulatory Visit

## 2011-09-26 ENCOUNTER — Inpatient Hospital Stay (HOSPITAL_COMMUNITY): Admission: RE | Admit: 2011-09-26 | Payer: Medicare Other | Source: Ambulatory Visit

## 2011-10-01 ENCOUNTER — Ambulatory Visit (HOSPITAL_COMMUNITY): Payer: Medicare Other

## 2011-10-02 ENCOUNTER — Ambulatory Visit (HOSPITAL_COMMUNITY): Payer: Medicare Other | Admitting: Physical Therapy

## 2011-10-03 ENCOUNTER — Ambulatory Visit (HOSPITAL_COMMUNITY): Payer: Medicare Other | Admitting: Physical Therapy

## 2011-10-04 NOTE — Progress Notes (Signed)
Physical Therapy D/C Patient Details  Name: [name] MRN: [id number] Date of Birth: Oct 21, 1928  Today's Date: 10/04/2011  Physical Therapy Assessment and Plan PT Assessment and Plan Clinical Impression Statement: Pt D/C from PT secondary to 3 No Shows.  G-code D/C provided. PT Plan: D/C    Goals    Problem List Patient Active Problem List  Diagnosis  . Difficulty in walking  . Osteoarthritis of leg  . Abnormality of gait  . Pain in joint, lower leg       GP Functional Limitation: Mobility: Walking and moving around Mobility: Walking and Moving Around Current Status [phone]): At least 60 percent but less than 80 percent impaired, limited or restricted Mobility: Walking and Moving Around Goal Status [phone]): At least 1 percent but less than 20 percent impaired, limited or restricted Mobility: Walking and Moving Around Discharge Status [phone]): At least 60 percent but less than 80 percent impaired, limited or restricted  [name] 10/04/2011, 9:08 AM

## 2011-11-11 ENCOUNTER — Other Ambulatory Visit (HOSPITAL_COMMUNITY): Payer: Self-pay | Admitting: Internal Medicine

## 2011-11-11 DIAGNOSIS — Z139 Encounter for screening, unspecified: Secondary | ICD-10-CM

## 2011-11-14 ENCOUNTER — Ambulatory Visit (HOSPITAL_COMMUNITY): Payer: Medicare Other

## 2011-11-19 ENCOUNTER — Ambulatory Visit (HOSPITAL_COMMUNITY)
Admission: RE | Admit: 2011-11-19 | Discharge: 2011-11-19 | Disposition: A | Discharge status: Home / Self Care | Payer: Medicare Other | Source: Ambulatory Visit | Attending: Internal Medicine | Admitting: Internal Medicine

## 2011-11-19 DIAGNOSIS — Z139 Encounter for screening, unspecified: Secondary | ICD-10-CM

## 2011-11-19 DIAGNOSIS — Z1231 Encounter for screening mammogram for malignant neoplasm of breast: Secondary | ICD-10-CM | POA: Insufficient documentation

## 2011-12-25 ENCOUNTER — Emergency Department (HOSPITAL_COMMUNITY): Payer: Medicare Other

## 2011-12-25 ENCOUNTER — Encounter (HOSPITAL_COMMUNITY): Payer: Self-pay | Admitting: *Deleted

## 2011-12-25 ENCOUNTER — Emergency Department (HOSPITAL_COMMUNITY)
Admission: EM | Admit: 2011-12-25 | Discharge: 2011-12-26 | Disposition: A | Discharge status: Home / Self Care | Payer: Medicare Other | Attending: Emergency Medicine | Admitting: Emergency Medicine

## 2011-12-25 DIAGNOSIS — Z7982 Long term (current) use of aspirin: Secondary | ICD-10-CM | POA: Insufficient documentation

## 2011-12-25 DIAGNOSIS — Y9389 Activity, other specified: Secondary | ICD-10-CM | POA: Insufficient documentation

## 2011-12-25 DIAGNOSIS — S2239XA Fracture of one rib, unspecified side, initial encounter for closed fracture: Secondary | ICD-10-CM | POA: Insufficient documentation

## 2011-12-25 DIAGNOSIS — Y929 Unspecified place or not applicable: Secondary | ICD-10-CM | POA: Insufficient documentation

## 2011-12-25 DIAGNOSIS — S2232XA Fracture of one rib, left side, initial encounter for closed fracture: Secondary | ICD-10-CM

## 2011-12-25 DIAGNOSIS — W19XXXA Unspecified fall, initial encounter: Secondary | ICD-10-CM | POA: Insufficient documentation

## 2011-12-25 DIAGNOSIS — Z79899 Other long term (current) drug therapy: Secondary | ICD-10-CM | POA: Insufficient documentation

## 2011-12-25 DIAGNOSIS — M1990 Unspecified osteoarthritis, unspecified site: Secondary | ICD-10-CM | POA: Insufficient documentation

## 2011-12-25 DIAGNOSIS — Z87442 Personal history of urinary calculi: Secondary | ICD-10-CM | POA: Insufficient documentation

## 2011-12-25 DIAGNOSIS — I10 Essential (primary) hypertension: Secondary | ICD-10-CM | POA: Insufficient documentation

## 2011-12-25 MED ORDER — KETOROLAC TROMETHAMINE 60 MG/2ML IM SOLN
60.0000 mg | Freq: Once | INTRAMUSCULAR | Status: AC
  Administered 2011-12-25: 60 mg via INTRAMUSCULAR
  Filled 2011-12-25: qty 2

## 2011-12-25 NOTE — ED Notes (Signed)
Pain lt chest, x 1 week, worse with movement and deep breath,  Fell last week and thinks she hurt herself , lt chest  Hurts worse tonight

## 2011-12-25 NOTE — ED Notes (Signed)
MD at bedside. 

## 2011-12-25 NOTE — ED Provider Notes (Addendum)
History   This chart was scribed for Vida Roller, MD by Sofie Rower, ED Scribe. The patient was seen in room [facility] and the patient's care was started at 10:56PM.     CSN: [id number]  Arrival date & time 12/25/11  2140   First MD Initiated Contact with Patient 12/25/11 2256      Chief Complaint  Patient presents with  . Chest Pain    (Consider location/radiation/quality/duration/timing/severity/associated sxs/prior treatment) The history is provided by the patient. No language interpreter was used.    [name] is a 78 y.o. female , with a hx of fall (12/19/11)  who presents to the Emergency Department complaining of sudden, progressively worsening, chest pain, located at the left side of the chest, onset six days ago (12/19/11).  Associated symptoms include shortness of breath. The pt reports she fell last Thursday, 12/19/11, while taking out the trash. The pt impacted upon her left rib cage. Modifying factors include deep breathing and certain movements and positions which intensifiy the chest pain.   The pt denies fever.   The pt does not smoke or drink alcohol.   PCP is Dr. [name].    Past Medical History  Diagnosis Date  . Hypertension   . Osteoarthritis   . Kidney stones     Past Surgical History  Procedure Date  . Kidney stone surgery   . Stress fractures of legs     History reviewed. No pertinent family history.  History  Substance Use Topics  . Smoking status: Never Smoker   . Smokeless tobacco: Not on file  . Alcohol Use: No    OB History    Grav Para Term Preterm Abortions TAB SAB Ect Mult Living                  Review of Systems  Constitutional: Negative for fever.  Respiratory: Negative for cough.   Cardiovascular: Positive for chest pain.    Allergies  Review of patient's allergies indicates no known allergies.  Home Medications   Current Outpatient Rx  Name  Route  Sig  Dispense  Refill  . ACETAMINOPHEN 500 MG PO TABS  Oral   Take 500 mg by mouth every 6 (six) hours as needed. For pain         . AMLODIPINE BESYLATE 5 MG PO TABS   Oral   Take 5 mg by mouth every evening.         . ASPIRIN EC 81 MG PO TBEC   Oral   Take 81 mg by mouth every evening.         Marland Kitchen BENAZEPRIL HCL 20 MG PO TABS   Oral   Take 20 mg by mouth every evening.          Marland Kitchen VITAMIN D 1000 UNITS PO TABS   Oral   Take 1,000 Units by mouth 2 (two) times daily.          Marland Kitchen CLORAZEPATE DIPOTASSIUM 7.5 MG PO TABS   Oral   Take 7.5 mg by mouth at bedtime as needed. For sleep         . IBUPROFEN 200 MG PO TABS   Oral   Take 200 mg by mouth every 6 (six) hours as needed. For pain         . HYDROCODONE-ACETAMINOPHEN 5-500 MG PO TABS   Oral   Take 1-2 tablets by mouth every 6 (six) hours as needed for pain.   15 tablet  0   . NAPROXEN 500 MG PO TABS   Oral   Take 1 tablet (500 mg total) by mouth 2 (two) times daily with a meal.   30 tablet   0     BP 167/78  Pulse 84  Temp 98.2 F (36.8 C) (Oral)  Resp 18  Ht 5\' 4"  (1.626 m)  Wt 136 lb (61.689 kg)  BMI 23.34 kg/m2  SpO2 98%  Physical Exam  Nursing note and vitals reviewed. Constitutional: She is oriented to person, place, and time. She appears well-developed and well-nourished. No distress.  HENT:  Head: Atraumatic.  Nose: Nose normal.  Eyes: EOM are normal.  Cardiovascular: Normal rate and normal heart sounds.   No murmur heard. Pulmonary/Chest: Effort normal and breath sounds normal. No respiratory distress. She exhibits no tenderness.       No bruising on the chest wall.  Focal tenderness to palpation of the left anterior upper chest wall, no crepitance  Abdominal: Soft. Bowel sounds are normal. There is no hepatosplenomegaly.  Musculoskeletal: Normal range of motion.  Neurological: She is alert and oriented to person, place, and time.  Skin: Skin is warm and dry.  Psychiatric: She has a normal mood and affect. Her behavior is normal.    ED  Course  Procedures (including critical care time)  DIAGNOSTIC STUDIES: Oxygen Saturation is 98% on room air, normal by my interpretation.    COORDINATION OF CARE:  11:02 PM- Treatment plan concerning pain management and evaluation of x-ray discussed with patient. Pt agrees with treatment.      Labs Reviewed - No data to display Dg Chest 2 View  12/25/2011  *RADIOLOGY REPORT*  Clinical Data: Larey Seat getting out of bathtub, felt a pop in the left side over chest, left chest pain under breast, history hypertension  CHEST - 2 VIEW  Comparison: None  Findings: Enlargement of cardiac silhouette. Calcified tortuous aorta. Pulmonary vascularity normal. Emphysematous changes question COPD. Nodular density right upper lobe 7 x 3 mm, likely calcified, higher attenuation than bone. Peribronchial thickening. No pulmonary infiltrate, pleural effusion or pneumothorax. Bones appear diffusely demineralized.  IMPRESSION: Enlargement of cardiac silhouette. COPD. No definite acute other abnormalities, effusion or pneumothorax. Calcified nodule right upper chest question calcified granuloma versus bone island at the anterior right second rib.   Original Report Authenticated By: Ulyses Southward, M.D.      1. Fracture of rib of left side       MDM  The patient appears stable, she has a rib fracture as seen on x-ray, oxygen of 98%, respiratory rate which is normal and a pulse of 84. She has no pneumonia seen on the x-ray, she has been given an incentive spirometer to help prevent pneumonia. She appears stable for discharge. Toradol given for pain  Procedure Note:  Definitive Fracture Care:  Definitive fracture care performred for the Left anterior upper rib fracture.  This included analgesia in the ED, incentive spiromotery and prescriptions for outpatient pain control which have been provided.  I have counseled the pt on possible complications of the fractures and signs and symptoms which would mandate return for  further care as well as the utility of RICE therapy. The patient has expressed her understanding.    ED ECG REPORT  I personally interpreted this EKG   Date: 12/26/2011   Rate: 89  Rhythm: normal sinus rhythm  QRS Axis: left  Intervals: normal  ST/T Wave abnormalities: normal  Conduction Disutrbances:left anterior fascicular block and incomplete right  bundle branch block  Narrative Interpretation:   Old EKG Reviewed: compared with 10/01/2008, no significant changes are seen     I personally performed the services described in this documentation, which was scribed in my presence. The recorded information has been reviewed and is accurate.      Vida Roller, MD 12/26/11 1610  Vida Roller, MD 12/26/11 [phone]

## 2011-12-25 NOTE — ED Notes (Signed)
C/o pain to left chest/ribs with movement and deep breaths.  Reports fell last week, but states pain became severe today after "doing a little work" around the house.

## 2011-12-26 MED ORDER — HYDROCODONE-ACETAMINOPHEN 5-500 MG PO TABS: 1.0000 | ORAL_TABLET | Freq: Four times a day (QID) | ORAL | Status: DC | PRN

## 2011-12-26 MED ORDER — NAPROXEN 500 MG PO TABS: 500.0000 mg | ORAL_TABLET | Freq: Two times a day (BID) | ORAL | Status: DC

## 2011-12-26 NOTE — ED Notes (Signed)
Discharge instructions reviewed with pt, questions answered. Pt verbalized understanding.  

## 2012-06-14 ENCOUNTER — Observation Stay (HOSPITAL_COMMUNITY)
Admission: EM | Admit: 2012-06-14 | Discharge: 2012-06-16 | Disposition: A | Discharge status: Home / Self Care | Payer: Medicare Other | Attending: Internal Medicine | Admitting: Internal Medicine

## 2012-06-14 ENCOUNTER — Encounter (HOSPITAL_COMMUNITY): Payer: Self-pay | Admitting: Emergency Medicine

## 2012-06-14 ENCOUNTER — Emergency Department (HOSPITAL_COMMUNITY): Payer: Medicare Other

## 2012-06-14 DIAGNOSIS — R262 Difficulty in walking, not elsewhere classified: Secondary | ICD-10-CM

## 2012-06-14 DIAGNOSIS — I10 Essential (primary) hypertension: Secondary | ICD-10-CM | POA: Insufficient documentation

## 2012-06-14 DIAGNOSIS — F411 Generalized anxiety disorder: Secondary | ICD-10-CM | POA: Insufficient documentation

## 2012-06-14 DIAGNOSIS — I619 Nontraumatic intracerebral hemorrhage, unspecified: Secondary | ICD-10-CM | POA: Insufficient documentation

## 2012-06-14 DIAGNOSIS — M1990 Unspecified osteoarthritis, unspecified site: Secondary | ICD-10-CM | POA: Insufficient documentation

## 2012-06-14 DIAGNOSIS — Z79899 Other long term (current) drug therapy: Secondary | ICD-10-CM | POA: Insufficient documentation

## 2012-06-14 DIAGNOSIS — I69998 Other sequelae following unspecified cerebrovascular disease: Secondary | ICD-10-CM | POA: Insufficient documentation

## 2012-06-14 DIAGNOSIS — Z7982 Long term (current) use of aspirin: Secondary | ICD-10-CM | POA: Insufficient documentation

## 2012-06-14 DIAGNOSIS — I639 Cerebral infarction, unspecified: Secondary | ICD-10-CM

## 2012-06-14 DIAGNOSIS — R42 Dizziness and giddiness: Principal | ICD-10-CM | POA: Insufficient documentation

## 2012-06-14 LAB — CBC WITH DIFFERENTIAL/PLATELET
Basophils Absolute: 0 10*3/uL (ref 0.0–0.1)
Basophils Relative: 0 % (ref 0–1)
Eosinophils Relative: 4 % (ref 0–5)
HCT: 39.5 % (ref 36.0–46.0)
Hemoglobin: 13.3 g/dL (ref 12.0–15.0)
MCHC: 33.7 g/dL (ref 30.0–36.0)
MCV: 91.4 fL (ref 78.0–100.0)
Monocytes Absolute: 0.4 10*3/uL (ref 0.1–1.0)
Monocytes Relative: 8 % (ref 3–12)
Neutro Abs: 2.3 10*3/uL (ref 1.7–7.7)
RDW: 13.4 % (ref 11.5–15.5)

## 2012-06-14 LAB — COMPREHENSIVE METABOLIC PANEL
Albumin: 3.6 g/dL (ref 3.5–5.2)
BUN: 21 mg/dL (ref 6–23)
CO2: 25 mEq/L (ref 19–32)
Calcium: 9.4 mg/dL (ref 8.4–10.5)
Chloride: 105 mEq/L (ref 96–112)
Creatinine, Ser: 0.88 mg/dL (ref 0.50–1.10)
GFR calc non Af Amer: 60 mL/min — ABNORMAL LOW (ref >90)
Total Bilirubin: 0.3 mg/dL (ref 0.3–1.2)

## 2012-06-14 LAB — URINE MICROSCOPIC-ADD ON

## 2012-06-14 LAB — PROTIME-INR
INR: 1 (ref 0.00–1.49)
Prothrombin Time: 13.1 seconds (ref 11.6–15.2)

## 2012-06-14 LAB — URINALYSIS, ROUTINE W REFLEX MICROSCOPIC
Bilirubin Urine: NEGATIVE
Nitrite: NEGATIVE
Protein, ur: NEGATIVE mg/dL
Urobilinogen, UA: 0.2 mg/dL (ref 0.0–1.0)

## 2012-06-14 LAB — APTT: aPTT: 33 seconds (ref 24–37)

## 2012-06-14 MED ORDER — ONDANSETRON HCL 4 MG PO TABS
4.0000 mg | ORAL_TABLET | Freq: Four times a day (QID) | ORAL | Status: DC | PRN
  Administered 2012-06-15: 4 mg via ORAL
  Filled 2012-06-14: qty 1

## 2012-06-14 MED ORDER — CLORAZEPATE DIPOTASSIUM 7.5 MG PO TABS
3.7500 mg | ORAL_TABLET | Freq: Two times a day (BID) | ORAL | Status: DC
  Administered 2012-06-14 – 2012-06-16 (×4): 3.75 mg via ORAL
  Filled 2012-06-14 (×4): qty 1

## 2012-06-14 MED ORDER — VITAMIN D 1000 UNITS PO TABS
1000.0000 [IU] | ORAL_TABLET | Freq: Two times a day (BID) | ORAL | Status: DC
  Administered 2012-06-14 – 2012-06-16 (×5): 1000 [IU] via ORAL
  Filled 2012-06-14 (×5): qty 1

## 2012-06-14 MED ORDER — ASPIRIN 81 MG PO CHEW
324.0000 mg | CHEWABLE_TABLET | Freq: Once | ORAL | Status: AC
  Administered 2012-06-14: 324 mg via ORAL
  Filled 2012-06-14: qty 4

## 2012-06-14 MED ORDER — HEPARIN SODIUM (PORCINE) 5000 UNIT/ML IJ SOLN
5000.0000 [IU] | Freq: Three times a day (TID) | INTRAMUSCULAR | Status: DC
  Administered 2012-06-14 – 2012-06-16 (×7): 5000 [IU] via SUBCUTANEOUS
  Filled 2012-06-14 (×7): qty 1

## 2012-06-14 MED ORDER — ONDANSETRON HCL 4 MG/2ML IJ SOLN
4.0000 mg | Freq: Four times a day (QID) | INTRAMUSCULAR | Status: DC | PRN
  Administered 2012-06-14: 4 mg via INTRAVENOUS

## 2012-06-14 MED ORDER — SODIUM CHLORIDE 0.9 % IJ SOLN
3.0000 mL | Freq: Two times a day (BID) | INTRAMUSCULAR | Status: DC
  Administered 2012-06-14 – 2012-06-15 (×3): 3 mL via INTRAVENOUS

## 2012-06-14 MED ORDER — POTASSIUM CHLORIDE CRYS ER 20 MEQ PO TBCR
20.0000 meq | EXTENDED_RELEASE_TABLET | Freq: Three times a day (TID) | ORAL | Status: AC
  Administered 2012-06-14 – 2012-06-15 (×6): 20 meq via ORAL
  Filled 2012-06-14 (×6): qty 1

## 2012-06-14 MED ORDER — ASPIRIN EC 81 MG PO TBEC
81.0000 mg | DELAYED_RELEASE_TABLET | Freq: Every evening | ORAL | Status: DC
Start: 2012-06-14 — End: 2012-06-16
  Administered 2012-06-14 – 2012-06-15 (×2): 81 mg via ORAL
  Filled 2012-06-14 (×2): qty 1

## 2012-06-14 MED ORDER — DEXTROSE 5 % IV SOLN
1.0000 g | Freq: Once | INTRAVENOUS | Status: AC
  Administered 2012-06-14: 1 g via INTRAVENOUS

## 2012-06-14 MED ORDER — SODIUM CHLORIDE 0.9 % IV SOLN
INTRAVENOUS | Status: DC
  Administered 2012-06-14: 21:00:00 via INTRAVENOUS

## 2012-06-14 MED ORDER — CLORAZEPATE DIPOTASSIUM 7.5 MG PO TABS: 7.5000 mg | ORAL_TABLET | Freq: Every evening | ORAL | Status: DC | PRN

## 2012-06-14 MED ORDER — SULFAMETHOXAZOLE-TMP DS 800-160 MG PO TABS
1.0000 | ORAL_TABLET | Freq: Two times a day (BID) | ORAL | Status: DC
Start: 2012-06-14 — End: 2012-06-16
  Administered 2012-06-14 – 2012-06-16 (×5): 1 via ORAL
  Filled 2012-06-14 (×5): qty 1

## 2012-06-14 MED ORDER — AMLODIPINE BESYLATE 5 MG PO TABS
5.0000 mg | ORAL_TABLET | Freq: Every day | ORAL | Status: DC
  Filled 2012-06-14: qty 1

## 2012-06-14 MED ORDER — DEXTROSE 5 % IV SOLN
1.0000 g | INTRAVENOUS | Status: DC
  Filled 2012-06-14: qty 10

## 2012-06-14 MED ORDER — SODIUM CHLORIDE 0.9 % IV SOLN
Freq: Once | INTRAVENOUS | Status: AC
  Administered 2012-06-14: 20 mL/h via INTRAVENOUS

## 2012-06-14 MED ORDER — DEXTROSE 5 % IV SOLN
INTRAVENOUS | Status: AC
  Filled 2012-06-14: qty 10

## 2012-06-14 MED ORDER — ACETAMINOPHEN 500 MG PO TABS
500.0000 mg | ORAL_TABLET | Freq: Four times a day (QID) | ORAL | Status: DC | PRN
  Administered 2012-06-15 (×2): 500 mg via ORAL
  Filled 2012-06-14 (×2): qty 1

## 2012-06-14 NOTE — H&P (Signed)
[facility] History and Physical  [name] ZOX:[id number] DOB: [birth year] DOA: 06/14/2012  Referring physician: Dr. Preston Fleeting, ER physician. PCP: Carylon Perches, MD    Chief Complaint: Dizziness.  HPI: [name] is a 79 y.o. female who woke up this morning at 1:30 AM to go to the bathroom. As soon as she stood up, she felt dizzy. She managed to go to the bathroom and apparently was incontinent. She definitely did not lose consciousness. She had no problems with speech. She just felt very weak and unsteady on her feet. She tells me that her symptoms improve when she lies down and get worse when she stands up. She apparently had similar symptoms when she was admitted a couple of years ago but these symptoms are worse today. Her symptoms appear to be resolved at the present time. Neuro telemetry was consulted and apparently they want to admit her to rule out CVA. She has been in the yard in the last couple of days in the hot weather.   Review of Systems: Apart from symptoms in history of present illness, other systems negative.  Past Medical History  Diagnosis Date  . Hypertension   . Osteoarthritis    Past Surgical History  Procedure Laterality Date  . Kidney stone surgery    . Stress fractures of legs    . Appendectomy    . Tubal ligation     Social History:  She is widowed, lost her husband August 2013. She stays in her home in the daytime but goes to live with her daughter at night. She does not smoke. She does not drink alcohol. She is usually fully independent.   No Known Allergies  History reviewed. No pertinent family history. noncontributory.  Prior to Admission medications   Medication Sig Start Date End Date Taking? Authorizing Provider  acetaminophen (TYLENOL) 500 MG tablet Take 500 mg by mouth every 6 (six) hours as needed. For pain   Yes Historical Provider, MD  aspirin EC 81 MG tablet Take 81 mg by mouth every evening.   Yes Historical Provider, MD  benazepril  (LOTENSIN) 20 MG tablet Take 20 mg by mouth every evening.    Yes Historical Provider, MD  cholecalciferol (VITAMIN D) 1000 UNITS tablet Take 1,000 Units by mouth 2 (two) times daily.    Yes Historical Provider, MD  clorazepate (TRANXENE) 7.5 MG tablet Take 7.5 mg by mouth at bedtime as needed. For sleep   Yes Historical Provider, MD  ibuprofen (ADVIL,MOTRIN) 200 MG tablet Take 200 mg by mouth every 6 (six) hours as needed. For pain   Yes Historical Provider, MD  amLODipine (NORVASC) 5 MG tablet Take 5 mg by mouth every evening.    Historical Provider, MD  HYDROcodone-acetaminophen (VICODIN) 5-500 MG per tablet Take 1-2 tablets by mouth every 6 (six) hours as needed for pain. 12/26/11   Vida Roller, MD  naproxen (NAPROSYN) 500 MG tablet Take 1 tablet (500 mg total) by mouth 2 (two) times daily with a meal. 12/26/11   Vida Roller, MD   Physical Exam: Filed Vitals:   06/14/12 0350 06/14/12 0500 06/14/12 0600 06/14/12 0700  BP: 155/64 151/62 170/64 142/58  Pulse:  60 57 63  Temp:    97.5 F (36.4 C)  TempSrc:    Oral  Resp:   11 18  Height:      Weight:      SpO2:   94% 96%     General:  She looks systemically well.  She is alert.  Eyes: No pallor. No jaundice.  ENT: No abnormalities.  Neck: No lymphadenopathy.  Cardiovascular: Heart sounds are present and appear to be in sinus rhythm. No carotid bruits. No murmurs. No gallop rhythm.  Respiratory: Lung fields are clear.  Abdomen: Soft, nontender. No hepatosplenomegaly.  Skin: No rash.  Musculoskeletal: Osteoarthritic changes in her hands, chronic.  Psychiatric: Somewhat tearful when she talks about her husband.  Neurologic: Alert and orientated. No cerebellar signs. No focal neurological signs in her arms or legs. She has good power in her arms and legs. Tone is normal. No cranial nerve abnormalities. Her speech is normal. There is no dysphasia or dysarthria.  Labs on Admission:  Basic Metabolic Panel:  Recent  Labs Lab 06/14/12 0232  NA 140  K 3.5  CL 105  CO2 25  GLUCOSE 104*  BUN 21  CREATININE 0.88  CALCIUM 9.4   Liver Function Tests:  Recent Labs Lab 06/14/12 0232  AST 21  ALT 11  ALKPHOS 72  BILITOT 0.3  PROT 6.8  ALBUMIN 3.6     CBC:  Recent Labs Lab 06/14/12 0232  WBC 4.9  NEUTROABS 2.3  HGB 13.3  HCT 39.5  MCV 91.4  PLT 169   Cardiac Enzymes:  Recent Labs Lab 06/14/12 0232  TROPONINI <0.30       Radiological Exams on Admission: Dg Chest 1 View  06/14/2012  *RADIOLOGY REPORT*  Clinical Data: Dizziness.  CHEST - 1 VIEW  Comparison: PA and lateral chest 12/25/2011.  Findings: Heart size is upper normal.  Lungs are clear.  No pneumothorax or pleural effusion.  No focal bony abnormality.  IMPRESSION: No acute disease.   Original Report Authenticated By: Holley Dexter, M.D.    Ct Head Wo Contrast  06/14/2012  *RADIOLOGY REPORT*  Clinical Data: Dizziness.  CT HEAD WITHOUT CONTRAST  Technique:  Contiguous axial images were obtained from the base of the skull through the vertex without contrast.  Comparison: Head CT scan 03/24/2010.  Findings: There is some cortical atrophy and chronic microvascular ischemic change.  No evidence of acute abnormality including infarction, hemorrhage, mass lesion, mass effect, midline shift or abnormal extra-axial fluid collection.  No hydrocephalus or pneumocephalus.  Calvarium intact.  IMPRESSION: No acute finding.  Atrophy and chronic microvascular ischemic change.   Original Report Authenticated By: Holley Dexter, M.D.     EKG: Independently reviewed. Normal sinus rhythm, right bundle branch block, no acute ST-T wave changes.  Assessment/Plan Active Problems:   Dizziness and giddiness   HTN (hypertension)   1. Dizziness, suspect this is orthostatic hypotension, compounded by recent hypovolemia secondary to being out in the hot weather. I do not see any clinical evidence of CVA. 2. Hypertension.  Plan: 1.  Admit. 2. Intravenous fluids. 3. Hold ACE inhibitor for the time being. 4. Orthostatics. 5. MRI and MRA brain to make sure there is no posterior circulation problem. I doubt this will be the case. Further recommendations will depend on patient's hospital progress.   Code Status: Full code.  Family Communication: Discussed plan with patient at the bedside.   Disposition Plan:  Home when medically stable.   Time spent: 45 minutes.  Wilson Singer [facility] Pager [phone].  If 7PM-7AM, please contact night-coverage [email or web address] Password [facility] 06/14/2012, 8:05 AM

## 2012-06-14 NOTE — ED Notes (Signed)
Patient c/o dizziness; states got up around 0115 to go to bathroom and became dizzy even when she was holding onto things.  Patient states she has felt tired this week.  Patient states her doctor took her off a BP medication a week ago.

## 2012-06-14 NOTE — Progress Notes (Signed)
[name], [name]                 ACCOUNT NO.:  [id number]  MEDICAL RECORD NO.:  [id number]  LOCATION:  A224                          FACILITY:  [facility]  PHYSICIAN:  Kingsley Callander. [name], MD       DATE OF BIRTH:  02-01-1929  DATE OF PROCEDURE: DATE OF DISCHARGE:                                PROGRESS NOTE   Ms. Santizo was admitted by the hospitalist early this morning after she presented with dizziness.  She had been tired all day yesterday.  She had worked in the heat she states.  When she got up to go to the bathroom about 1:30, she felt very dizzy and felt as if she would pass out.  She did not pass out.  She was brought to the emergency room, where she was felt to likely be suffering from orthostasis and possible volume depletion.  She has been treated with IV fluids.  Head CT revealed no acute abnormality.  Her chest x-ray was negative.  Her electrolytes and renal function studies are normal.  Her potassium is low normal at 3.5.  Her troponin is normal at less than 0.30.  Her EKG revealed normal sinus rhythm.  She did have 7-10 WBCs with many bacteria on her urinalysis.  She denies dysuria.  She has been treated with ceftriaxone.  She has had blood pressures as high as 170/64.  Her last lying blood pressure was 142/58.  She has had similar orthostatic type symptoms on a previous office visit and one of her antihypertensives was discontinued.  I believe that is amlodipine, but I will need to check the chart at the office.  She has not had any bleeding.  She has not had any vomiting.  She has had some nausea.  VITAL SIGNS:  She is afebrile. GENERAL:  She appears well.  She is cognitively at her baseline, which is intact. LUNGS:  Clear. HEART:  Regular with no murmurs. ABDOMEN:  Soft and nontender with no palpable organomegaly. EXTREMITIES:  Reveal no edema. NEURO:  No focal weakness.  Her speech is normal.  IMPRESSION/PLAN: 1. Dizziness, likely secondary to orthostatic hypotension.   We will     hold benazepril also.  Orders have been placed for an MRI and MRA.     These will be obtained tomorrow. 2. Hypertension. 3. Anxiety.  Continue her usual low-dose transiting b.i.d.     Kingsley Callander. [name], MD    ROF/MEDQ  D:  06/14/2012  T:  06/14/2012  Job:  [id number]

## 2012-06-14 NOTE — ED Provider Notes (Signed)
History     CSN: [id number]  Arrival date & time 06/14/12  0157   First MD Initiated Contact with Patient 06/14/12 [phone]      Chief Complaint  Patient presents with  . Dizziness    (Consider location/radiation/quality/duration/timing/severity/associated sxs/prior treatment) The history is provided by the patient.   79 year old female was last known normal when she went to bed at about 2100. At 01 15, she woke up to go to the bathroom and noticed that she was very dizzy when she stood up. She felt that she didn't hold onto things, she would fall. She did not actually fall but was not able to walk without holding onto things. She did have urinary urge incontinence because she was unable to get to the bathroom in time. She denies headache. She states she is feeling dizzy but has difficulty describing the dizziness sensation. She denies visual change. There was momentary nausea but no vomiting. She denies chest pain, heaviness, tightness, pressure. She denies weakness of her arms or legs. She was taken off amlodipine one week ago.  Past Medical History  Diagnosis Date  . Hypertension   . Osteoarthritis   . Kidney stones     Past Surgical History  Procedure Laterality Date  . Kidney stone surgery    . Stress fractures of legs      No family history on file.  History  Substance Use Topics  . Smoking status: Never Smoker   . Smokeless tobacco: Not on file  . Alcohol Use: No    OB History   Grav Para Term Preterm Abortions TAB SAB Ect Mult Living                  Review of Systems  All other systems reviewed and are negative.    Allergies  Review of patient's allergies indicates no known allergies.  Home Medications   Current Outpatient Rx  Name  Route  Sig  Dispense  Refill  . acetaminophen (TYLENOL) 500 MG tablet   Oral   Take 500 mg by mouth every 6 (six) hours as needed. For pain         . aspirin EC 81 MG tablet   Oral   Take 81 mg by mouth every  evening.         . benazepril (LOTENSIN) 20 MG tablet   Oral   Take 20 mg by mouth every evening.          . cholecalciferol (VITAMIN D) 1000 UNITS tablet   Oral   Take 1,000 Units by mouth 2 (two) times daily.          . clorazepate (TRANXENE) 7.5 MG tablet   Oral   Take 7.5 mg by mouth at bedtime as needed. For sleep         . ibuprofen (ADVIL,MOTRIN) 200 MG tablet   Oral   Take 200 mg by mouth every 6 (six) hours as needed. For pain         . amLODipine (NORVASC) 5 MG tablet   Oral   Take 5 mg by mouth every evening.         Marland Kitchen HYDROcodone-acetaminophen (VICODIN) 5-500 MG per tablet   Oral   Take 1-2 tablets by mouth every 6 (six) hours as needed for pain.   15 tablet   0   . naproxen (NAPROSYN) 500 MG tablet   Oral   Take 1 tablet (500 mg total) by mouth 2 (two) times daily  with a meal.   30 tablet   0     BP 163/77  Pulse 62  Temp(Src) 98.1 F (36.7 C) (Oral)  Resp 16  Ht 5\' 1"  (1.549 m)  Wt 132 lb (59.875 kg)  BMI 24.95 kg/m2  SpO2 95%  Physical Exam  Nursing note and vitals reviewed.  79 year old female, resting comfortably and in no acute distress. Vital signs are significant for hypertension with blood pressure 163/77. Oxygen saturation is 95%, which is normal. Head is normocephalic and atraumatic. PERRLA, EOMI. Oropharynx is clear. Fundi show no hemorrhage, exudate, or papilledema. Neck is nontender and supple without adenopathy or JVD. There no carotid bruits. Back is nontender and there is no CVA tenderness. Lungs are clear without rales, wheezes, or rhonchi. Chest is nontender. Heart has regular rate and rhythm without murmur. Abdomen is soft, flat, nontender without masses or hepatosplenomegaly and peristalsis is normoactive. Extremities have no cyanosis or edema, full range of motion is present. Skin is warm and dry without rash. Neurologic: Mental status is normal, cranial nerves are intact, there are no motor or sensory deficits.  Finger to nose test is normal without any evidence of ataxia. Romberg test shows general unsteadiness without a clear tendency to fall in any one direction. Gait is markedly ataxic.  ED Course  Procedures (including critical care time)  Results for orders placed during the hospital encounter of 06/14/12  CBC WITH DIFFERENTIAL      Result Value Range   WBC 4.9  4.0 - 10.5 K/uL   RBC 4.32  3.87 - 5.11 MIL/uL   Hemoglobin 13.3  12.0 - 15.0 g/dL   HCT 82.9  56.2 - 13.0 %   MCV 91.4  78.0 - 100.0 fL   MCH 30.8  26.0 - 34.0 pg   MCHC 33.7  30.0 - 36.0 g/dL   RDW 86.5  78.4 - 69.6 %   Platelets 169  150 - 400 K/uL   Neutrophils Relative 46  43 - 77 %   Neutro Abs 2.3  1.7 - 7.7 K/uL   Lymphocytes Relative 41  12 - 46 %   Lymphs Abs 2.0  0.7 - 4.0 K/uL   Monocytes Relative 8  3 - 12 %   Monocytes Absolute 0.4  0.1 - 1.0 K/uL   Eosinophils Relative 4  0 - 5 %   Eosinophils Absolute 0.2  0.0 - 0.7 K/uL   Basophils Relative 0  0 - 1 %   Basophils Absolute 0.0  0.0 - 0.1 K/uL  COMPREHENSIVE METABOLIC PANEL      Result Value Range   Sodium 140  135 - 145 mEq/L   Potassium 3.5  3.5 - 5.1 mEq/L   Chloride 105  96 - 112 mEq/L   CO2 25  19 - 32 mEq/L   Glucose, Bld 104 (*) 70 - 99 mg/dL   BUN 21  6 - 23 mg/dL   Creatinine, Ser 2.95  0.50 - 1.10 mg/dL   Calcium 9.4  8.4 - 28.4 mg/dL   Total Protein 6.8  6.0 - 8.3 g/dL   Albumin 3.6  3.5 - 5.2 g/dL   AST 21  0 - 37 U/L   ALT 11  0 - 35 U/L   Alkaline Phosphatase 72  39 - 117 U/L   Total Bilirubin 0.3  0.3 - 1.2 mg/dL   GFR calc non Af Amer 60 (*) >90 mL/min   GFR calc Af Amer 70 (*) >90 mL/min  PROTIME-INR      Result Value Range   Prothrombin Time 13.1  11.6 - 15.2 seconds   INR 1.00  0.00 - 1.49  APTT      Result Value Range   aPTT 33  24 - 37 seconds  URINALYSIS, ROUTINE W REFLEX MICROSCOPIC      Result Value Range   Color, Urine YELLOW  YELLOW   APPearance CLEAR  CLEAR   Specific Gravity, Urine 1.010  1.005 - 1.030   pH 6.0   5.0 - 8.0   Glucose, UA NEGATIVE  NEGATIVE mg/dL   Hgb urine dipstick SMALL (*) NEGATIVE   Bilirubin Urine NEGATIVE  NEGATIVE   Ketones, ur NEGATIVE  NEGATIVE mg/dL   Protein, ur NEGATIVE  NEGATIVE mg/dL   Urobilinogen, UA 0.2  0.0 - 1.0 mg/dL   Nitrite NEGATIVE  NEGATIVE   Leukocytes, UA TRACE (*) NEGATIVE  TROPONIN I      Result Value Range   Troponin I <0.30  <0.30 ng/mL  URINE MICROSCOPIC-ADD ON      Result Value Range   Squamous Epithelial / LPF RARE  RARE   WBC, UA 7-10  <3 WBC/hpf   RBC / HPF 0-2  <3 RBC/hpf   Bacteria, UA MANY (*) RARE   Dg Chest 1 View  06/14/2012  *RADIOLOGY REPORT*  Clinical Data: Dizziness.  CHEST - 1 VIEW  Comparison: PA and lateral chest 12/25/2011.  Findings: Heart size is upper normal.  Lungs are clear.  No pneumothorax or pleural effusion.  No focal bony abnormality.  IMPRESSION: No acute disease.   Original Report Authenticated By: Holley Dexter, M.D.    Ct Head Wo Contrast  06/14/2012  *RADIOLOGY REPORT*  Clinical Data: Dizziness.  CT HEAD WITHOUT CONTRAST  Technique:  Contiguous axial images were obtained from the base of the skull through the vertex without contrast.  Comparison: Head CT scan 03/24/2010.  Findings: There is some cortical atrophy and chronic microvascular ischemic change.  No evidence of acute abnormality including infarction, hemorrhage, mass lesion, mass effect, midline shift or abnormal extra-axial fluid collection.  No hydrocephalus or pneumocephalus.  Calvarium intact.  IMPRESSION: No acute finding.  Atrophy and chronic microvascular ischemic change.   Original Report Authenticated By: Holley Dexter, M.D.     Images viewed by me.   Date: 06/14/2012  Rate: 61  Rhythm: normal sinus rhythm  QRS Axis: left  Intervals: normal  ST/T Wave abnormalities: normal  Conduction Disutrbances:left anterior fascicular block and Incomplete right bundle-branch block  Narrative Interpretation: Incomplete right bundle-branch block, left  anterior fascicular block. When compared with ECG of 12/25/2011, no significant changes are seen.  Old EKG Reviewed: unchanged    1. Stroke    CRITICAL CARE Performed by: Dione Booze Total critical care time: 40 minutes Critical care time was exclusive of separately billable procedures and treating other patients. Critical care was necessary to treat or prevent imminent or life-threatening deterioration. Critical care was time spent personally by me on the following activities: development of treatment plan with patient and/or surrogate as well as nursing, discussions with consultants, evaluation of patient's response to treatment, examination of patient, obtaining history from patient or surrogate, ordering and performing treatments and interventions, ordering and review of laboratory studies, ordering and review of radiographic studies, pulse oximetry and re-evaluation of patient's condition.    MDM  Dizziness and ataxia worrisome for posterior circulation stroke. She is outside of the timeframe for thrombolytic treatment. Consultation with neurology will be obtained.  I  have discussed case with the neuro hospitalists at [facility] who agrees that she should not have a code stroke called based on the timeframe and lack of lateralizing findings. Tele-neurology consult notes that she is improved but not at baseline. Neurologist feels that she most likely has a stroke versus possible TIA. At this point, she does not need the services of a stroke Center so decision is made to admit her here. Case is been discussed with Dr. Phillips Odor of [facility] who agrees to admit the patient.      Dione Booze, MD 06/14/12 [id number]

## 2012-06-15 ENCOUNTER — Inpatient Hospital Stay (HOSPITAL_COMMUNITY): Payer: Medicare Other

## 2012-06-15 LAB — URINE CULTURE: Colony Count: 55000

## 2012-06-15 NOTE — Progress Notes (Signed)
Dr. [name] notified of patients MRI/MRA results.

## 2012-06-15 NOTE — Care Management Note (Addendum)
    Page 1 of 1   06/15/2012     5:07:04 PM   CARE MANAGEMENT NOTE 06/15/2012  Patient:  LENYX, BOODY   Account Number:  [id number]  Date Initiated:  06/15/2012  Documentation initiated by:  Anibal Henderson  Subjective/Objective Assessment:   Admitted with dizziness and r/o CVA/TIA. Pt is independent, lives at home alone, but sleeps at her  daughter and son-in-law's home, who live near-by.     Action/Plan:   HH to follow for short term therapy and B/P monitoring   Anticipated DC Date:  06/15/2012   Anticipated DC Plan:  HOME W HOME HEALTH SERVICES      DC Planning Services  CM consult      [facility] Choice  HOME HEALTH   Choice offered to / List presented to:  C-1 Patient        HH arranged  HH-1 RN  HH-2 PT      [facility] agency  [facility]   Status of service:  Completed, signed off Medicare Important Message given?   (If response is "NO", the following Medicare IM given date fields will be blank) Date Medicare IM given:   Date Additional Medicare IM given:    Discharge Disposition:  HOME W HOME HEALTH SERVICES  Per UR Regulation:  Reviewed for med. necessity/level of care/duration of stay  If discussed at Long Length of Stay Meetings, dates discussed:    Comments:  06/15/12 1300 Anibal Henderson RN

## 2012-06-15 NOTE — Progress Notes (Signed)
UR Chart Review Completed  

## 2012-06-16 NOTE — Progress Notes (Signed)
Patient and family state understanding of discharge instructions 

## 2012-06-17 NOTE — Discharge Summary (Signed)
[name], [name]                 ACCOUNT NO.:  [id number]  MEDICAL RECORD NO.:  [id number]  LOCATION:  A224                          FACILITY:  [facility]  PHYSICIAN:  Kingsley Callander. [name], MD       DATE OF BIRTH:  February 14, 1928  DATE OF ADMISSION:  06/14/2012 DATE OF DISCHARGE:  05/13/2014LH                              DISCHARGE SUMMARY   DISCHARGE DIAGNOSES: 1. Dizziness, likely secondary to orthostatic hypotension. 2. Hypertension. 3. History of old stroke. 4. Anxiety. 5. Osteoarthritis.  HOSPITAL COURSE:  This patient is an 79 year old female, who presented with dizziness without loss of consciousness.  She was weak and unsteady on her feet.  Symptoms of dizziness were exacerbated upon standing.  She recently had medication adjustments as an outpatient for this. Amlodipine had been discontinued.  Benazepril had been continued.  On admission, benazepril was discontinued as well.  She underwent a CT scan of the head, which revealed no evidence of acute stroke.  She subsequently had an MRI and MRA, which revealed no evidence of acute stroke.  She did have evidence of an old stroke.  There were extensive chronic microvascular ischemic changes noted.  There was a tiny focus of chronic hemorrhage in the right posterior frontal subcortical white matter, unchanged from 2010.  This was felt to be consistent with sequelae from hypertensive cerebrovascular disease.  Urinalysis revealed 7-10 white cells and many bacteria.  Her urine culture revealed 55,000 colonies per mL of multiple bacteria.  She was treated initially with ceftriaxone and then modified to oral trimethoprim sulfa.  This is being stopped.  She is asymptomatic.  Her EKG revealed normal sinus rhythm.  Her chest x-ray revealed no acute infiltrate.  Blood counts were normal. Electrolytes, renal function, and liver function studies were normal.  Her symptoms improved.  She felt well on the morning of discharge.  She will be seen in  followup in my office in 2 weeks.  Home health for physical therapy and RN visits was arranged.  DISCHARGE MEDICATIONS: 1. Aspirin 81 mg daily. 2. Vitamin D 1000 units b.i.d. 3. Tranxene 3.75 mg b.i.d. 4. Acetaminophen 500 mg q.6 h. p.r.n.     Kingsley Callander. [name], MD     ROF/MEDQ  D:  06/16/2012  T:  06/17/2012  Job:  [id number]

## 2012-06-17 NOTE — Progress Notes (Signed)
NAME:  Hanline,                            ACCOUNT NO.:  MEDICAL RECORD NO.:  LOCATION:                                 FACILITY:  PHYSICIAN:  Kingsley Callander. [name], MD            DATE OF BIRTH:  DATE OF PROCEDURE:  06/15/2012 DATE OF DISCHARGE:                                PROGRESS NOTE   Mrs. Olds felt much better in the morning without any symptoms of dizziness; however, in the early evening, she states she feels too weak to even sit up and has to lie back down.  She has had no fever.  PHYSICAL EXAMINATION:  VITAL SIGNS:  Reveal fluctuations in her systolic pressure standing from the 130 range to the 180 range.  Heart rates have been normal. LUNGS:  Clear. HEART:  Regular with no murmurs. ABDOMEN:  Nontender with no organomegaly. EXTREMITIES:  No edema. NEURO:  No focal weakness.  IMPRESSION/PLAN: 1. Dizziness.  Her MRI and MRA reveal no signs of acute abnormalities.     Systolic blood pressures have been variable.  She is presently     quite symptomatic and is very uncomfortable.  It appears she likely     has some underlying anxiety as well.  This morning I felt she would     probably be able to be discharged this afternoon, but now with     recurrent symptoms, she will be observed overnight. 2. Possible urinary tract infection, culture pending.  Continue oral     Bactrim. 3. Hypokalemia.  Serum potassium is normalized with supplementation. 4. Hypertension.     Kingsley Callander. [name], MD     ROF/MEDQ  D:  06/15/2012  T:  06/16/2012  Job:  [id number]

## 2012-11-19 ENCOUNTER — Encounter (HOSPITAL_COMMUNITY): Payer: Self-pay | Admitting: Emergency Medicine

## 2012-11-19 ENCOUNTER — Emergency Department (HOSPITAL_COMMUNITY): Payer: Medicare Other

## 2012-11-19 ENCOUNTER — Inpatient Hospital Stay (HOSPITAL_COMMUNITY)
Admission: EM | Admit: 2012-11-19 | Discharge: 2012-11-25 | DRG: 481 | Disposition: A | Discharge status: Skilled Nursing Facility | Payer: Medicare Other | Attending: Internal Medicine | Admitting: Internal Medicine

## 2012-11-19 DIAGNOSIS — S72009A Fracture of unspecified part of neck of unspecified femur, initial encounter for closed fracture: Secondary | ICD-10-CM

## 2012-11-19 DIAGNOSIS — N390 Urinary tract infection, site not specified: Secondary | ICD-10-CM | POA: Diagnosis present

## 2012-11-19 DIAGNOSIS — I951 Orthostatic hypotension: Secondary | ICD-10-CM | POA: Diagnosis present

## 2012-11-19 DIAGNOSIS — S72141A Displaced intertrochanteric fracture of right femur, initial encounter for closed fracture: Secondary | ICD-10-CM

## 2012-11-19 DIAGNOSIS — M1990 Unspecified osteoarthritis, unspecified site: Secondary | ICD-10-CM | POA: Diagnosis present

## 2012-11-19 DIAGNOSIS — Z87440 Personal history of urinary (tract) infections: Secondary | ICD-10-CM

## 2012-11-19 DIAGNOSIS — S72001A Fracture of unspecified part of neck of right femur, initial encounter for closed fracture: Secondary | ICD-10-CM

## 2012-11-19 DIAGNOSIS — W010XXA Fall on same level from slipping, tripping and stumbling without subsequent striking against object, initial encounter: Secondary | ICD-10-CM | POA: Diagnosis present

## 2012-11-19 DIAGNOSIS — F411 Generalized anxiety disorder: Secondary | ICD-10-CM | POA: Diagnosis present

## 2012-11-19 DIAGNOSIS — M810 Age-related osteoporosis without current pathological fracture: Secondary | ICD-10-CM | POA: Diagnosis present

## 2012-11-19 DIAGNOSIS — E049 Nontoxic goiter, unspecified: Secondary | ICD-10-CM | POA: Diagnosis present

## 2012-11-19 DIAGNOSIS — E876 Hypokalemia: Secondary | ICD-10-CM | POA: Diagnosis present

## 2012-11-19 DIAGNOSIS — A499 Bacterial infection, unspecified: Secondary | ICD-10-CM

## 2012-11-19 DIAGNOSIS — D62 Acute posthemorrhagic anemia: Secondary | ICD-10-CM | POA: Diagnosis not present

## 2012-11-19 DIAGNOSIS — S72143A Displaced intertrochanteric fracture of unspecified femur, initial encounter for closed fracture: Principal | ICD-10-CM | POA: Diagnosis present

## 2012-11-19 DIAGNOSIS — I10 Essential (primary) hypertension: Secondary | ICD-10-CM | POA: Diagnosis present

## 2012-11-19 DIAGNOSIS — Y92009 Unspecified place in unspecified non-institutional (private) residence as the place of occurrence of the external cause: Secondary | ICD-10-CM

## 2012-11-19 DIAGNOSIS — D696 Thrombocytopenia, unspecified: Secondary | ICD-10-CM | POA: Diagnosis present

## 2012-11-19 LAB — CBC WITH DIFFERENTIAL/PLATELET
Basophils Absolute: 0 10*3/uL (ref 0.0–0.1)
Basophils Relative: 0 % (ref 0–1)
MCHC: 33.5 g/dL (ref 30.0–36.0)
Neutro Abs: 12.6 10*3/uL — ABNORMAL HIGH (ref 1.7–7.7)
Neutrophils Relative %: 87 % — ABNORMAL HIGH (ref 43–77)
RDW: 13.4 % (ref 11.5–15.5)

## 2012-11-19 LAB — BASIC METABOLIC PANEL
BUN: 15 mg/dL (ref 6–23)
Calcium: 9.3 mg/dL (ref 8.4–10.5)
Creatinine, Ser: 0.68 mg/dL (ref 0.50–1.10)
GFR calc Af Amer: >90 mL/min (ref >90)
GFR calc non Af Amer: 80 mL/min — ABNORMAL LOW (ref >90)

## 2012-11-19 LAB — PREPARE RBC (CROSSMATCH)

## 2012-11-19 MED ORDER — METOPROLOL TARTRATE 25 MG PO TABS
12.5000 mg | ORAL_TABLET | Freq: Two times a day (BID) | ORAL | Status: DC
  Administered 2012-11-19 – 2012-11-25 (×12): 12.5 mg via ORAL
  Filled 2012-11-19 (×12): qty 1

## 2012-11-19 MED ORDER — DEXTROSE 5 % IV SOLN
INTRAVENOUS | Status: AC
  Filled 2012-11-19: qty 10

## 2012-11-19 MED ORDER — MORPHINE SULFATE 2 MG/ML IJ SOLN
0.5000 mg | INTRAMUSCULAR | Status: DC | PRN
  Administered 2012-11-19 – 2012-11-24 (×23): 0.5 mg via INTRAVENOUS
  Filled 2012-11-19 (×24): qty 1

## 2012-11-19 MED ORDER — MORPHINE SULFATE 2 MG/ML IJ SOLN
INTRAMUSCULAR | Status: AC
  Administered 2012-11-19: 2 mg via INTRAVENOUS
  Filled 2012-11-19: qty 1

## 2012-11-19 MED ORDER — POTASSIUM CHLORIDE 10 MEQ/100ML IV SOLN: 10.0000 meq | INTRAVENOUS | Status: AC

## 2012-11-19 MED ORDER — ONDANSETRON HCL 4 MG/2ML IJ SOLN
4.0000 mg | Freq: Four times a day (QID) | INTRAMUSCULAR | Status: DC | PRN
  Administered 2012-11-19: 4 mg via INTRAVENOUS
  Filled 2012-11-19: qty 2

## 2012-11-19 MED ORDER — ACETAMINOPHEN 325 MG PO TABS
650.0000 mg | ORAL_TABLET | Freq: Four times a day (QID) | ORAL | Status: DC | PRN
  Administered 2012-11-22 – 2012-11-23 (×2): 650 mg via ORAL
  Filled 2012-11-19 (×2): qty 2

## 2012-11-19 MED ORDER — POTASSIUM CHLORIDE 10 MEQ/100ML IV SOLN
10.0000 meq | INTRAVENOUS | Status: AC
  Administered 2012-11-20 (×2): 10 meq via INTRAVENOUS

## 2012-11-19 MED ORDER — DEXTROSE 5 % IV SOLN
1.0000 g | INTRAVENOUS | Status: DC
  Administered 2012-11-20 – 2012-11-23 (×5): 1 g via INTRAVENOUS
  Filled 2012-11-19 (×5): qty 10

## 2012-11-19 MED ORDER — CEFAZOLIN SODIUM-DEXTROSE 2-3 GM-% IV SOLR
2.0000 g | Freq: Once | INTRAVENOUS | Status: DC
  Filled 2012-11-19: qty 50

## 2012-11-19 MED ORDER — ENOXAPARIN SODIUM 40 MG/0.4ML ~~LOC~~ SOLN
40.0000 mg | SUBCUTANEOUS | Status: DC
  Administered 2012-11-19: 40 mg via SUBCUTANEOUS
  Filled 2012-11-19: qty 0.4

## 2012-11-19 MED ORDER — ONDANSETRON HCL 4 MG PO TABS: 4.0000 mg | ORAL_TABLET | Freq: Four times a day (QID) | ORAL | Status: DC | PRN

## 2012-11-19 MED ORDER — SODIUM CHLORIDE 0.9 % IV SOLN
INTRAVENOUS | Status: DC
  Administered 2012-11-19 – 2012-11-24 (×6): via INTRAVENOUS

## 2012-11-19 MED ORDER — SODIUM CHLORIDE 0.9 % IV BOLUS (SEPSIS)
500.0000 mL | Freq: Once | INTRAVENOUS | Status: AC
  Administered 2012-11-19: 500 mL via INTRAVENOUS

## 2012-11-19 MED ORDER — MORPHINE SULFATE 2 MG/ML IJ SOLN
2.0000 mg | Freq: Once | INTRAMUSCULAR | Status: AC
  Administered 2012-11-19: 2 mg via INTRAVENOUS

## 2012-11-19 MED ORDER — ACETAMINOPHEN 650 MG RE SUPP: 650.0000 mg | Freq: Four times a day (QID) | RECTAL | Status: DC | PRN

## 2012-11-19 MED ORDER — MORPHINE SULFATE 2 MG/ML IJ SOLN
2.0000 mg | Freq: Once | INTRAMUSCULAR | Status: AC
  Administered 2012-11-19: 2 mg via INTRAVENOUS
  Filled 2012-11-19: qty 1

## 2012-11-19 MED ORDER — CLORAZEPATE DIPOTASSIUM 7.5 MG PO TABS
3.7500 mg | ORAL_TABLET | Freq: Two times a day (BID) | ORAL | Status: DC
  Administered 2012-11-19 – 2012-11-25 (×11): 3.75 mg via ORAL
  Filled 2012-11-19 (×11): qty 1

## 2012-11-19 MED ORDER — POVIDONE-IODINE 10 % EX SOLN
CUTANEOUS | Status: AC
  Filled 2012-11-19: qty 118

## 2012-11-19 MED ORDER — POVIDONE-IODINE 10 % EX SOLN
Freq: Once | CUTANEOUS | Status: AC
  Administered 2012-11-20: 01:00:00 via TOPICAL
  Filled 2012-11-19: qty 118

## 2012-11-19 MED ORDER — LORAZEPAM 2 MG/ML IJ SOLN
1.0000 mg | Freq: Once | INTRAMUSCULAR | Status: AC
  Administered 2012-11-19: 1 mg via INTRAVENOUS
  Filled 2012-11-19: qty 1

## 2012-11-19 MED ORDER — ONDANSETRON HCL 4 MG/2ML IJ SOLN
4.0000 mg | Freq: Once | INTRAMUSCULAR | Status: AC
  Administered 2012-11-19: 4 mg via INTRAVENOUS
  Filled 2012-11-19: qty 2

## 2012-11-19 MED ORDER — LOSARTAN POTASSIUM 50 MG PO TABS
50.0000 mg | ORAL_TABLET | Freq: Every day | ORAL | Status: DC
  Administered 2012-11-19 – 2012-11-25 (×6): 50 mg via ORAL
  Filled 2012-11-19 (×6): qty 1

## 2012-11-19 MED ORDER — POTASSIUM CHLORIDE 10 MEQ/100ML IV SOLN
10.0000 meq | INTRAVENOUS | Status: DC
  Administered 2012-11-19: 10 meq via INTRAVENOUS
  Filled 2012-11-19 (×3): qty 100

## 2012-11-19 NOTE — Consult Note (Signed)
Reason for Consult:Fracture of the right hip Referring Physician: ER  [name] is an 79 y.o. female.  HPI: She fell in her kitchen today and hurt her right hip.  Her son is present in the ER.  She has pain in the right hip and inability to sit or stand.  She has no other injury.  I have told her she has intertrochanteric fracture of the hip and will need surgery on it.  I have gone over risks and imponderables including infection, embolus, possible blood transfusion, need for walker and physical therapy, need for skilled nursing home placement and anesthesia risks.  I have suggested consideration of spinal anesthesia.  She and her son asked appropriate questions.  They both agree to procedure.  It will probably be tomorrow afternoon.  Past Medical History  Diagnosis Date  . Hypertension   . Osteoarthritis     Past Surgical History  Procedure Laterality Date  . Kidney stone surgery    . Stress fractures of legs    . Appendectomy    . Tubal ligation      No family history on file.  Social History:  reports that she has never smoked. She does not have any smokeless tobacco history on file. She reports that she does not drink alcohol or use illicit drugs.  Allergies:  Allergies  Allergen Reactions  . Hydrocodone-Acetaminophen Nausea And Vomiting    Medications: I have reviewed the patient's current medications.  Results for orders placed during the hospital encounter of 11/19/12 (from the past 48 hour(s))  BASIC METABOLIC PANEL     Status: Abnormal   Collection Time    11/19/12  4:35 PM      Result Value Range   Sodium 137  135 - 145 mEq/L   Potassium 3.1 (*) 3.5 - 5.1 mEq/L   Chloride 100  96 - 112 mEq/L   CO2 24  19 - 32 mEq/L   Glucose, Bld 132 (*) 70 - 99 mg/dL   BUN 15  6 - 23 mg/dL   Creatinine, Ser 1.61  0.50 - 1.10 mg/dL   Calcium 9.3  8.4 - 09.6 mg/dL   GFR calc non Af Amer 80 (*) >90 mL/min   GFR calc Af Amer >90  >90 mL/min   Comment: (NOTE)     The eGFR  has been calculated using the CKD EPI equation.     This calculation has not been validated in all clinical situations.     eGFR's persistently <90 mL/min signify possible Chronic Kidney     Disease.  CBC WITH DIFFERENTIAL     Status: Abnormal   Collection Time    11/19/12  4:35 PM      Result Value Range   WBC 14.5 (*) 4.0 - 10.5 K/uL   RBC 4.27  3.87 - 5.11 MIL/uL   Hemoglobin 13.4  12.0 - 15.0 g/dL   HCT 04.5  40.9 - 81.1 %   MCV 93.7  78.0 - 100.0 fL   MCH 31.4  26.0 - 34.0 pg   MCHC 33.5  30.0 - 36.0 g/dL   RDW 91.4  78.2 - 95.6 %   Platelets 203  150 - 400 K/uL   Neutrophils Relative % 87 (*) 43 - 77 %   Neutro Abs 12.6 (*) 1.7 - 7.7 K/uL   Lymphocytes Relative 9 (*) 12 - 46 %   Lymphs Abs 1.3  0.7 - 4.0 K/uL   Monocytes Relative 4  3 -  12 %   Monocytes Absolute 0.6  0.1 - 1.0 K/uL   Eosinophils Relative 0  0 - 5 %   Eosinophils Absolute 0.0  0.0 - 0.7 K/uL   Basophils Relative 0  0 - 1 %   Basophils Absolute 0.0  0.0 - 0.1 K/uL    Dg Chest 1 View  11/19/2012   CLINICAL DATA:  Right hip fracture.  Pre operative respiratory EXAM.  EXAM: CHEST - 1 VIEW  COMPARISON:  06/14/2012  FINDINGS: Chronic slight cardiomegaly. Pulmonary vascularity is normal and the lungs are clear. No acute osseous abnormality.  IMPRESSION: No active disease.   Electronically Signed   By: Geanie Cooley M.D.   On: 11/19/2012 17:05   Dg Femur Right  11/19/2012   CLINICAL DATA:  Fall. Right hip pain.  EXAM: RIGHT FEMUR - 2 VIEW  COMPARISON:  04/13/2009  FINDINGS: There is a comminuted, mildly displaced intertrochanteric fracture of the right proximal femur with varus angulation. There is is separate lesser trochanter fracture component that is displaced medially by 1 cm. The major fracture fragments are displaced approximately 1 cm. The hip joint is normally aligned. There are arthropathic changes of the right hip joint at have mildly advanced from the prior study.  There is an area of cortical thickening  and lucency along the lateral cortical margin of the mid to distal femoral shaft. This was present on the prior study and may reflect an old insufficiency fracture but is nonspecific.  The bones are demineralized. The knee joint is normally aligned.  IMPRESSION: Mildly comminuted and displaced intertrochanteric fracture of the right proximal femur with varus angulation.   Electronically Signed   By: Amie Portland M.D.   On: 11/19/2012 17:03    Review of Systems  Cardiovascular:       Hypertension  Genitourinary:       Current UTI on admission.  Musculoskeletal: Positive for falls (Fell at home today in her kitchen), joint pain (multiple joint pains of arthritis.) and myalgias.       History of stress fracture of the mid to lower right femur in past.  Has some pain from it at times.   Blood pressure 157/57, pulse 70, temperature 98.5 F (36.9 C), temperature source Oral, resp. rate 18, height 5\' 2"  (1.575 m), weight 59.875 kg (132 lb), SpO2 93.00%. Physical Exam  Constitutional: She is oriented to person, place, and time. She appears well-developed and well-nourished.  HENT:  Head: Normocephalic and atraumatic.  Eyes: Conjunctivae and EOM are normal. Pupils are equal, round, and reactive to light.  Neck: Normal range of motion. Neck supple.  Cardiovascular: Normal rate, regular rhythm and intact distal pulses.   Respiratory: Effort normal.  GI: Soft.  Musculoskeletal: She exhibits tenderness (Pain right hip to any motion.  Some shortening present.  NV is intact.).       Right hip: She exhibits decreased range of motion.       Legs: Neurological: She is alert and oriented to person, place, and time. She has normal reflexes.  Skin: Skin is warm and dry.  Psychiatric: She has a normal mood and affect. Her behavior is normal. Judgment and thought content normal.    Assessment/Plan: Intertrochanteric fracture of the right hip.  For surgical fixation, probably tomorrow afternoon if obtain  medical clearance.  [name] 11/19/2012, 5:50 PM

## 2012-11-19 NOTE — ED Notes (Signed)
Per EMS, pt fell this am in kitchen. Pt reports tripped and fell. Denies LOC. Per EMS, no shortening or rotation noted and distal pulses intact.

## 2012-11-19 NOTE — ED Notes (Signed)
Requesting more pain medication. Dr. Adriana Simas aware and new order received.

## 2012-11-19 NOTE — H&P (Signed)
PCP:   Carylon Perches, MD   Chief Complaint:  Status post fall  HPI: 79 year old female with history of osteoarthritis, hypertension who was brought to the hospital after patient sustained a fall while standing in the kitchen. She hurt her right hip and is found to have right hip intertrochanteric fracture. Patient denies testing out denies blurred vision as per patient she lost her balance and fell. She denies chest pain or shortness of breath no nausea vomiting or diarrhea. Patient does not have history of CAD no congestive heart failure. Patient usually walks with a cane and was able to walk for some distance , which is limited due to patient's osteoarthritis.  Allergies:   Allergies  Allergen Reactions  . Hydrocodone-Acetaminophen Nausea And Vomiting      Past Medical History  Diagnosis Date  . Hypertension   . Osteoarthritis     Past Surgical History  Procedure Laterality Date  . Kidney stone surgery    . Stress fractures of legs    . Appendectomy    . Tubal ligation      Prior to Admission medications   Medication Sig Start Date End Date Taking? Authorizing Provider  acetaminophen (TYLENOL) 500 MG tablet Take 500 mg by mouth every 6 (six) hours as needed. For pain   Yes Historical Provider, MD  aspirin EC 81 MG tablet Take 81 mg by mouth every evening.   Yes Historical Provider, MD  cholecalciferol (VITAMIN D) 1000 UNITS tablet Take 1,000 Units by mouth 2 (two) times daily.    Yes Historical Provider, MD  clorazepate (TRANXENE) 7.5 MG tablet Take 3.75 mg by mouth 2 (two) times daily.   Yes Historical Provider, MD  losartan (COZAAR) 50 MG tablet Take 50 mg by mouth daily.   Yes Historical Provider, MD    Social History:  reports that she has never smoked. She does not have any smokeless tobacco history on file. She reports that she does not drink alcohol or use illicit drugs.    All the positives are listed in BOLD  Review of Systems:  HEENT: Headache, blurred vision,  runny nose, sore throat Neck: Hypothyroidism, hyperthyroidism,,lymphadenopathy Chest : Shortness of breath, history of COPD, Asthma Heart : Chest pain, history of coronary arterey disease GI:  Nausea, vomiting, diarrhea, constipation, GERD GU: Dysuria, urgency, frequency of urination, hematuria Neuro: Stroke, seizures, syncope Psych: Depression, anxiety, hallucinations   Physical Exam: Blood pressure 182/74, pulse 70, temperature 98.6 F (37 C), temperature source Oral, resp. rate 16, height 5\' 2"  (1.575 m), weight 65 kg (143 lb 4.8 oz), SpO2 97.00%. Constitutional:   Patient is a well-developed and well-nourished female in no acute distress and cooperative with exam. Head: Normocephalic and atraumatic Mouth: Mucus membranes moist Eyes: PERRL, EOMI, conjunctivae normal Neck: Supple, No Thyromegaly Cardiovascular: RRR, S1 normal, S2 normal Pulmonary/Chest: CTAB, no wheezes, rales, or rhonchi Abdominal: Soft. Non-tender, non-distended, bowel sounds are normal, no masses, organomegaly, or guarding present.  Neurological: A&O x3, Strenght is normal and symmetric bilaterally, cranial nerve II-XII are grossly intact, no focal motor deficit, sensory intact to light touch bilaterally.  Extremities : Right lower extremity is externally rotated, tender to palpation on right hip   Labs on Admission:  Results for orders placed during the hospital encounter of 11/19/12 (from the past 48 hour(s))  BASIC METABOLIC PANEL     Status: Abnormal   Collection Time    11/19/12  4:35 PM      Result Value Range   Sodium 137  135 - 145 mEq/L   Potassium 3.1 (*) 3.5 - 5.1 mEq/L   Chloride 100  96 - 112 mEq/L   CO2 24  19 - 32 mEq/L   Glucose, Bld 132 (*) 70 - 99 mg/dL   BUN 15  6 - 23 mg/dL   Creatinine, Ser 4.78  0.50 - 1.10 mg/dL   Calcium 9.3  8.4 - 29.5 mg/dL   GFR calc non Af Amer 80 (*) >90 mL/min   GFR calc Af Amer >90  >90 mL/min   Comment: (NOTE)     The eGFR has been calculated using the  CKD EPI equation.     This calculation has not been validated in all clinical situations.     eGFR's persistently <90 mL/min signify possible Chronic Kidney     Disease.  CBC WITH DIFFERENTIAL     Status: Abnormal   Collection Time    11/19/12  4:35 PM      Result Value Range   WBC 14.5 (*) 4.0 - 10.5 K/uL   RBC 4.27  3.87 - 5.11 MIL/uL   Hemoglobin 13.4  12.0 - 15.0 g/dL   HCT 62.1  30.8 - 65.7 %   MCV 93.7  78.0 - 100.0 fL   MCH 31.4  26.0 - 34.0 pg   MCHC 33.5  30.0 - 36.0 g/dL   RDW 84.6  96.2 - 95.2 %   Platelets 203  150 - 400 K/uL   Neutrophils Relative % 87 (*) 43 - 77 %   Neutro Abs 12.6 (*) 1.7 - 7.7 K/uL   Lymphocytes Relative 9 (*) 12 - 46 %   Lymphs Abs 1.3  0.7 - 4.0 K/uL   Monocytes Relative 4  3 - 12 %   Monocytes Absolute 0.6  0.1 - 1.0 K/uL   Eosinophils Relative 0  0 - 5 %   Eosinophils Absolute 0.0  0.0 - 0.7 K/uL   Basophils Relative 0  0 - 1 %   Basophils Absolute 0.0  0.0 - 0.1 K/uL    Radiological Exams on Admission: Dg Chest 1 View  11/19/2012   CLINICAL DATA:  Right hip fracture.  Pre operative respiratory EXAM.  EXAM: CHEST - 1 VIEW  COMPARISON:  06/14/2012  FINDINGS: Chronic slight cardiomegaly. Pulmonary vascularity is normal and the lungs are clear. No acute osseous abnormality.  IMPRESSION: No active disease.   Electronically Signed   By: Geanie Cooley M.D.   On: 11/19/2012 17:05   Dg Femur Right  11/19/2012   CLINICAL DATA:  Fall. Right hip pain.  EXAM: RIGHT FEMUR - 2 VIEW  COMPARISON:  04/13/2009  FINDINGS: There is a comminuted, mildly displaced intertrochanteric fracture of the right proximal femur with varus angulation. There is is separate lesser trochanter fracture component that is displaced medially by 1 cm. The major fracture fragments are displaced approximately 1 cm. The hip joint is normally aligned. There are arthropathic changes of the right hip joint at have mildly advanced from the prior study.  There is an area of cortical  thickening and lucency along the lateral cortical margin of the mid to distal femoral shaft. This was present on the prior study and may reflect an old insufficiency fracture but is nonspecific.  The bones are demineralized. The knee joint is normally aligned.  IMPRESSION: Mildly comminuted and displaced intertrochanteric fracture of the right proximal femur with varus angulation.   Electronically Signed   By: Amie Portland M.D.   On: 11/19/2012  17:03    Assessment/Plan Principal Problem:   Intertrochanteric fracture of right hip Active Problems:   HTN (hypertension)   UTI (urinary tract infection)  Intertrochanteric fracture right hip Patient has been seen by orthopedics and plan is for surgery in the a.m.  UTI Patient was found to have mildly abnormal urine analysis, will start empirically on Rocephin Follow the urine culture results  Hypertension Patient's blood pressure is elevated at this time, she will be continued on Cozaar 50 kg by mouth daily. We'll also start beta blockers metoprolol 12.5 mg by mouth twice a day, as the blood pressure is elevated and beta blockers are known to prevent cardiac events pre and postoperatively.  Hypokalemia Patient potassium is 3.1 today, we'll replace potassium and check BMP in the morning  Risk stratification- patient does not have significant cardiac risk factors, though EKG shows left anterior fascicular block, and incomplete right bundle branch block. Patient is moderate risk for surgery due to advanced age. Patient's mobility is limited due to osteoarthritis  Code status: patient is full code at this time   Family discussion: discussed with family    Time Spent on Admission: 75 min  [facility] Pager: [phone] 11/19/2012, 8:39 PM  If 7PM-7AM, please contact night-coverage  [email or web address]  Password [email or web address]

## 2012-11-19 NOTE — ED Provider Notes (Signed)
CSN: [id number]     Arrival date & time 11/19/12  1531 History   First MD Initiated Contact with Patient 11/19/12 1539     Chief Complaint  Patient presents with  . Fall   (Consider location/radiation/quality/duration/timing/severity/associated sxs/prior Treatment) Patient is a 79 y.o. female presenting with fall.  Fall  .Marland Kitchen... accidental mechanical fall and kitchen today with associated sharp pain to right lateral proximal femur. No head or neck injury. Palpation and positioning makes pain worse. Severity is moderate.   No chest pain, dyspnea  Past Medical History  Diagnosis Date  . Hypertension   . Osteoarthritis    Past Surgical History  Procedure Laterality Date  . Kidney stone surgery    . Stress fractures of legs    . Appendectomy    . Tubal ligation     No family history on file. History  Substance Use Topics  . Smoking status: Never Smoker   . Smokeless tobacco: Not on file  . Alcohol Use: No   OB History   Grav Para Term Preterm Abortions TAB SAB Ect Mult Living                 Review of Systems  Unable to perform ROS   Allergies  Hydrocodone-acetaminophen  Home Medications   Current Outpatient Rx  Name  Route  Sig  Dispense  Refill  . acetaminophen (TYLENOL) 500 MG tablet   Oral   Take 500 mg by mouth every 6 (six) hours as needed. For pain         . aspirin EC 81 MG tablet   Oral   Take 81 mg by mouth every evening.         . cholecalciferol (VITAMIN D) 1000 UNITS tablet   Oral   Take 1,000 Units by mouth 2 (two) times daily.          . clorazepate (TRANXENE) 7.5 MG tablet   Oral   Take 3.75 mg by mouth 2 (two) times daily.         Marland Kitchen losartan (COZAAR) 50 MG tablet   Oral   Take 50 mg by mouth daily.          BP 157/57  Pulse 70  Temp(Src) 98.5 F (36.9 C) (Oral)  Resp 18  Ht 5\' 2"  (1.575 m)  Wt 132 lb (59.875 kg)  BMI 24.14 kg/m2  SpO2 93% Physical Exam  Nursing note and vitals reviewed. Constitutional: She is  oriented to person, place, and time. She appears well-developed and well-nourished.  HENT:  Head: Normocephalic and atraumatic.  Eyes: Conjunctivae and EOM are normal. Pupils are equal, round, and reactive to light.  Neck: Normal range of motion. Neck supple.  Cardiovascular: Normal rate, regular rhythm and normal heart sounds.   Pulmonary/Chest: Effort normal and breath sounds normal.  Abdominal: Soft. Bowel sounds are normal.  Musculoskeletal:  Right lower extremity: Tender proximal lateral femur  Neurological: She is alert and oriented to person, place, and time.  Skin: Skin is warm and dry.  Psychiatric: She has a normal mood and affect.    ED Course  Procedures (including critical care time) Labs Review Labs Reviewed  BASIC METABOLIC PANEL - Abnormal; Notable for the following:    Potassium 3.1 (*)    Glucose, Bld 132 (*)    GFR calc non Af Amer 80 (*)    All other components within normal limits  CBC WITH DIFFERENTIAL - Abnormal; Notable for the following:    WBC  14.5 (*)    Neutrophils Relative % 87 (*)    Neutro Abs 12.6 (*)    Lymphocytes Relative 9 (*)    All other components within normal limits   Imaging Review Dg Chest 1 View  11/19/2012   CLINICAL DATA:  Right hip fracture.  Pre operative respiratory EXAM.  EXAM: CHEST - 1 VIEW  COMPARISON:  06/14/2012  FINDINGS: Chronic slight cardiomegaly. Pulmonary vascularity is normal and the lungs are clear. No acute osseous abnormality.  IMPRESSION: No active disease.   Electronically Signed   By: Geanie Cooley M.D.   On: 11/19/2012 17:05   Dg Femur Right  11/19/2012   CLINICAL DATA:  Fall. Right hip pain.  EXAM: RIGHT FEMUR - 2 VIEW  COMPARISON:  04/13/2009  FINDINGS: There is a comminuted, mildly displaced intertrochanteric fracture of the right proximal femur with varus angulation. There is is separate lesser trochanter fracture component that is displaced medially by 1 cm. The major fracture fragments are displaced  approximately 1 cm. The hip joint is normally aligned. There are arthropathic changes of the right hip joint at have mildly advanced from the prior study.  There is an area of cortical thickening and lucency along the lateral cortical margin of the mid to distal femoral shaft. This was present on the prior study and may reflect an old insufficiency fracture but is nonspecific.  The bones are demineralized. The knee joint is normally aligned.  IMPRESSION: Mildly comminuted and displaced intertrochanteric fracture of the right proximal femur with varus angulation.   Electronically Signed   By: Amie Portland M.D.   On: 11/19/2012 17:03    EKG Interpretation   None       MDM   1. Closed right hip fracture, initial encounter    X-ray right femur shows a mildly comminuted displaced intertrochanteric fracture of the right proximal femur with varus angulation.  Discussed with Dr. Hilda Lias.  Admit to general medicine.   Donnetta Hutching, MD 11/19/12 [phone]

## 2012-11-20 ENCOUNTER — Encounter (HOSPITAL_COMMUNITY)
Admission: EM | Disposition: A | Discharge status: Skilled Nursing Facility | Payer: Self-pay | Source: Home / Self Care | Attending: Internal Medicine

## 2012-11-20 ENCOUNTER — Encounter (HOSPITAL_COMMUNITY): Payer: Medicare Other | Admitting: Anesthesiology

## 2012-11-20 ENCOUNTER — Encounter (HOSPITAL_COMMUNITY): Payer: Self-pay | Admitting: *Deleted

## 2012-11-20 ENCOUNTER — Inpatient Hospital Stay (HOSPITAL_COMMUNITY): Payer: Medicare Other

## 2012-11-20 ENCOUNTER — Inpatient Hospital Stay (HOSPITAL_COMMUNITY): Payer: Medicare Other | Admitting: Anesthesiology

## 2012-11-20 HISTORY — PX: ORIF HIP FRACTURE: SHX2125

## 2012-11-20 LAB — COMPREHENSIVE METABOLIC PANEL
ALT: 10 U/L (ref 0–35)
Alkaline Phosphatase: 48 U/L (ref 39–117)
CO2: 25 mEq/L (ref 19–32)
GFR calc Af Amer: >90 mL/min (ref >90)
GFR calc non Af Amer: 80 mL/min — ABNORMAL LOW (ref >90)
Glucose, Bld: 142 mg/dL — ABNORMAL HIGH (ref 70–99)
Potassium: 3.9 mEq/L (ref 3.5–5.1)
Sodium: 138 mEq/L (ref 135–145)
Total Bilirubin: 0.8 mg/dL (ref 0.3–1.2)

## 2012-11-20 LAB — CBC
Hemoglobin: 11.6 g/dL — ABNORMAL LOW (ref 12.0–15.0)
MCH: 31 pg (ref 26.0–34.0)
RBC: 3.74 MIL/uL — ABNORMAL LOW (ref 3.87–5.11)
WBC: 8.8 10*3/uL (ref 4.0–10.5)

## 2012-11-20 LAB — SURGICAL PCR SCREEN
MRSA, PCR: POSITIVE — AB
Staphylococcus aureus: POSITIVE — AB

## 2012-11-20 SURGERY — OPEN REDUCTION INTERNAL FIXATION HIP
Anesthesia: Spinal | Site: Hip | Laterality: Right | Wound class: Clean

## 2012-11-20 MED ORDER — LACTATED RINGERS IV SOLN
INTRAVENOUS | Status: DC | PRN
  Administered 2012-11-20: 11:00:00 via INTRAVENOUS

## 2012-11-20 MED ORDER — PROMETHAZINE HCL 25 MG/ML IJ SOLN: 12.5000 mg | INTRAMUSCULAR | Status: DC | PRN

## 2012-11-20 MED ORDER — MUPIROCIN 2 % EX OINT
1.0000 "application " | TOPICAL_OINTMENT | Freq: Two times a day (BID) | CUTANEOUS | Status: AC
  Administered 2012-11-20 – 2012-11-24 (×10): 1 via NASAL
  Filled 2012-11-20 (×3): qty 22

## 2012-11-20 MED ORDER — ALBUTEROL SULFATE (5 MG/ML) 0.5% IN NEBU: 2.5000 mg | INHALATION_SOLUTION | RESPIRATORY_TRACT | Status: DC | PRN

## 2012-11-20 MED ORDER — MIDAZOLAM HCL 5 MG/5ML IJ SOLN
INTRAMUSCULAR | Status: DC | PRN
  Administered 2012-11-20 (×2): .5 mg via INTRAVENOUS

## 2012-11-20 MED ORDER — SODIUM CHLORIDE 0.9 % IR SOLN
Status: DC | PRN
  Administered 2012-11-20: 1000 mL

## 2012-11-20 MED ORDER — PHENYLEPHRINE HCL 10 MG/ML IJ SOLN
INTRAMUSCULAR | Status: DC | PRN
  Administered 2012-11-20 (×2): 100 ug via INTRAVENOUS
  Administered 2012-11-20: 50 ug via INTRAVENOUS

## 2012-11-20 MED ORDER — FENTANYL CITRATE 0.05 MG/ML IJ SOLN
INTRAMUSCULAR | Status: DC | PRN
  Administered 2012-11-20: 25 ug via INTRAVENOUS
  Administered 2012-11-20: 20 ug via INTRATHECAL
  Administered 2012-11-20: 25 ug via INTRAVENOUS

## 2012-11-20 MED ORDER — PROPOFOL 10 MG/ML IV EMUL
INTRAVENOUS | Status: AC
  Filled 2012-11-20: qty 20

## 2012-11-20 MED ORDER — LACTATED RINGERS IV SOLN
INTRAVENOUS | Status: DC
  Administered 2012-11-20: 1000 mL via INTRAVENOUS

## 2012-11-20 MED ORDER — FENTANYL CITRATE 0.05 MG/ML IJ SOLN: 25.0000 ug | INTRAMUSCULAR | Status: DC | PRN

## 2012-11-20 MED ORDER — LORAZEPAM 0.5 MG PO TABS
0.5000 mg | ORAL_TABLET | Freq: Three times a day (TID) | ORAL | Status: DC | PRN
  Administered 2012-11-20 – 2012-11-21 (×2): 0.5 mg via ORAL
  Filled 2012-11-20 (×2): qty 1

## 2012-11-20 MED ORDER — PHENYLEPHRINE HCL 10 MG/ML IJ SOLN
INTRAMUSCULAR | Status: AC
  Filled 2012-11-20: qty 1

## 2012-11-20 MED ORDER — MIDAZOLAM HCL 2 MG/2ML IJ SOLN
INTRAMUSCULAR | Status: AC
  Filled 2012-11-20: qty 2

## 2012-11-20 MED ORDER — ONDANSETRON HCL 4 MG/2ML IJ SOLN: 4.0000 mg | Freq: Once | INTRAMUSCULAR | Status: AC | PRN

## 2012-11-20 MED ORDER — MIDAZOLAM HCL 2 MG/2ML IJ SOLN
1.0000 mg | INTRAMUSCULAR | Status: DC | PRN
  Administered 2012-11-20: 2 mg via INTRAVENOUS

## 2012-11-20 MED ORDER — FENTANYL CITRATE 0.05 MG/ML IJ SOLN
INTRAMUSCULAR | Status: AC
  Filled 2012-11-20: qty 2

## 2012-11-20 MED ORDER — PROPOFOL INFUSION 10 MG/ML OPTIME
INTRAVENOUS | Status: DC | PRN
  Administered 2012-11-20: 75 ug/kg/min via INTRAVENOUS

## 2012-11-20 MED ORDER — SODIUM CHLORIDE BACTERIOSTATIC 0.9 % IJ SOLN
INTRAMUSCULAR | Status: AC
  Filled 2012-11-20: qty 10

## 2012-11-20 MED ORDER — ENOXAPARIN SODIUM 40 MG/0.4ML ~~LOC~~ SOLN
40.0000 mg | SUBCUTANEOUS | Status: DC
  Administered 2012-11-21 – 2012-11-23 (×3): 40 mg via SUBCUTANEOUS
  Filled 2012-11-20 (×3): qty 0.4

## 2012-11-20 MED ORDER — MAGNESIUM HYDROXIDE 400 MG/5ML PO SUSP: 30.0000 mL | Freq: Every day | ORAL | Status: DC | PRN

## 2012-11-20 MED ORDER — LORAZEPAM 2 MG/ML IJ SOLN
0.5000 mg | Freq: Three times a day (TID) | INTRAMUSCULAR | Status: DC | PRN
  Administered 2012-11-21: 0.5 mg via INTRAMUSCULAR
  Filled 2012-11-20: qty 1

## 2012-11-20 MED ORDER — ALBUTEROL SULFATE (5 MG/ML) 0.5% IN NEBU: 2.5000 mg | INHALATION_SOLUTION | Freq: Four times a day (QID) | RESPIRATORY_TRACT | Status: DC

## 2012-11-20 MED ORDER — EPHEDRINE SULFATE 50 MG/ML IJ SOLN
INTRAMUSCULAR | Status: DC | PRN
  Administered 2012-11-20: 5 mg via INTRAVENOUS
  Administered 2012-11-20: 10 mg via INTRAVENOUS
  Administered 2012-11-20: 5 mg via INTRAVENOUS

## 2012-11-20 MED ORDER — BUPIVACAINE IN DEXTROSE 0.75-8.25 % IT SOLN
INTRATHECAL | Status: DC | PRN
  Administered 2012-11-20: 13 mg via INTRATHECAL

## 2012-11-20 MED ORDER — ARTIFICIAL TEARS OP OINT
TOPICAL_OINTMENT | OPHTHALMIC | Status: AC
  Filled 2012-11-20: qty 3.5

## 2012-11-20 MED ORDER — EPHEDRINE SULFATE 50 MG/ML IJ SOLN
INTRAMUSCULAR | Status: AC
  Filled 2012-11-20: qty 1

## 2012-11-20 MED ORDER — LIDOCAINE HCL (CARDIAC) 10 MG/ML IV SOLN
INTRAVENOUS | Status: DC | PRN
  Administered 2012-11-20: 50 mg via INTRAVENOUS

## 2012-11-20 MED ORDER — SODIUM CHLORIDE BACTERIOSTATIC 0.9 % IJ SOLN
INTRAMUSCULAR | Status: AC
  Filled 2012-11-20: qty 20

## 2012-11-20 MED ORDER — BUPIVACAINE IN DEXTROSE 0.75-8.25 % IT SOLN
INTRATHECAL | Status: AC
  Filled 2012-11-20: qty 2

## 2012-11-20 MED ORDER — FENTANYL CITRATE 0.05 MG/ML IJ SOLN
25.0000 ug | INTRAMUSCULAR | Status: DC
  Administered 2012-11-20: 25 ug via INTRAVENOUS

## 2012-11-20 MED ORDER — CHLORHEXIDINE GLUCONATE CLOTH 2 % EX PADS
6.0000 | MEDICATED_PAD | Freq: Every day | CUTANEOUS | Status: AC
  Administered 2012-11-20 – 2012-11-24 (×5): 6 via TOPICAL

## 2012-11-20 MED ORDER — HYDROGEN PEROXIDE 3 % EX SOLN
CUTANEOUS | Status: DC | PRN
  Administered 2012-11-20: 1

## 2012-11-20 MED ORDER — CEFAZOLIN SODIUM-DEXTROSE 2-3 GM-% IV SOLR
INTRAVENOUS | Status: AC
  Filled 2012-11-20: qty 50

## 2012-11-20 SURGICAL SUPPLY — 51 items
BAG HAMPER (MISCELLANEOUS) ×2 IMPLANT
BIT DRILL TWIST 3.5MM (BIT) ×1 IMPLANT
BLADE SURG SZ10 CARB STEEL (BLADE) ×4 IMPLANT
BLADE SURG SZ20 CARB STEEL (BLADE) ×2 IMPLANT
CLOTH BEACON ORANGE TIMEOUT ST (SAFETY) ×2 IMPLANT
COVER LIGHT HANDLE STERIS (MISCELLANEOUS) ×4 IMPLANT
COVER MAYO STAND XLG (DRAPE) ×2 IMPLANT
DRAPE STERI IOBAN 125X83 (DRAPES) ×2 IMPLANT
DRILL TWIST 3.5MM (BIT) ×2
ELECT REM PT RETURN 9FT ADLT (ELECTROSURGICAL) ×2
ELECTRODE REM PT RTRN 9FT ADLT (ELECTROSURGICAL) ×1 IMPLANT
EVACUATOR 3/16  PVC DRAIN (DRAIN) ×1
EVACUATOR 3/16 PVC DRAIN (DRAIN) ×1 IMPLANT
GAUZE XEROFORM 5X9 LF (GAUZE/BANDAGES/DRESSINGS) ×2 IMPLANT
GLOVE BIO SURGEON STRL SZ8 (GLOVE) ×2 IMPLANT
GLOVE BIO SURGEON STRL SZ8.5 (GLOVE) ×2 IMPLANT
GLOVE BIOGEL PI IND STRL 7.0 (GLOVE) ×3 IMPLANT
GLOVE BIOGEL PI IND STRL 7.5 (GLOVE) ×1 IMPLANT
GLOVE BIOGEL PI INDICATOR 7.0 (GLOVE) ×3
GLOVE BIOGEL PI INDICATOR 7.5 (GLOVE) ×1
GLOVE ECLIPSE 7.0 STRL STRAW (GLOVE) ×2 IMPLANT
GLOVE SS BIOGEL STRL SZ 6.5 (GLOVE) ×1 IMPLANT
GLOVE SUPERSENSE BIOGEL SZ 6.5 (GLOVE) ×1
GOWN STRL REIN XL XLG (GOWN DISPOSABLE) ×6 IMPLANT
GUIDE PIN CALIBRATED (PIN) ×4 IMPLANT
INST SET MAJOR BONE (KITS) ×2 IMPLANT
KIT BLADEGUARD II DBL (SET/KITS/TRAYS/PACK) ×2 IMPLANT
KIT ROOM TURNOVER AP CYSTO (KITS) ×2 IMPLANT
MANIFOLD NEPTUNE II (INSTRUMENTS) ×2 IMPLANT
MARKER SKIN DUAL TIP RULER LAB (MISCELLANEOUS) ×2 IMPLANT
NS IRRIG 1000ML POUR BTL (IV SOLUTION) ×2 IMPLANT
PACK BASIC III (CUSTOM PROCEDURE TRAY) ×1
PACK SRG BSC III STRL LF ECLPS (CUSTOM PROCEDURE TRAY) ×1 IMPLANT
PAD ABD 5X9 TENDERSORB (GAUZE/BANDAGES/DRESSINGS) ×6 IMPLANT
PAD ARMBOARD 7.5X6 YLW CONV (MISCELLANEOUS) ×2 IMPLANT
PENCIL HANDSWITCHING (ELECTRODE) ×2 IMPLANT
PLATE SHORT BARREL 135X4 (Plate) ×2 IMPLANT
SCREW CORTICAL 48MM (Screw) ×2 IMPLANT
SCREW CORTICAL SFTP 4.5X40MM (Screw) ×6 IMPLANT
SCREW LAG 80 (Screw) ×1 IMPLANT
SCREW LAG 80MM (Screw) ×2 IMPLANT
SET BASIN LINEN APH (SET/KITS/TRAYS/PACK) ×2 IMPLANT
SPONGE GAUZE 4X4 12PLY (GAUZE/BANDAGES/DRESSINGS) ×2 IMPLANT
SPONGE LAP 18X18 X RAY DECT (DISPOSABLE) ×4 IMPLANT
STAPLER VISISTAT 35W (STAPLE) ×2 IMPLANT
SUT BRALON NAB BRD #1 30IN (SUTURE) ×8 IMPLANT
SUT PLAIN 2 0 XLH (SUTURE) ×4 IMPLANT
SUT SILK 0 FSL (SUTURE) ×2 IMPLANT
SYR BULB IRRIGATION 50ML (SYRINGE) ×2 IMPLANT
TAPE MEDIFIX FOAM 3 (GAUZE/BANDAGES/DRESSINGS) ×2 IMPLANT
YANKAUER SUCT 12FT TUBE ARGYLE (SUCTIONS) ×2 IMPLANT

## 2012-11-20 NOTE — Progress Notes (Signed)
Subjective: This is a patient of Dr. [name] and he is out-of-town. She has suffered a hip fracture of the right hip. She has a history of urinary tract infection and hypertension. She also has a goiter. She was hypokalemic on admission and that has been treated and is improved.  Objective: Vital signs in last 24 hours: Temp:  [98.1 F (36.7 C)-98.7 F (37.1 C)] 98.1 F (36.7 C) (10/17 0519) Pulse Rate:  [62-90] 62 (10/17 0519) Resp:  [16-18] 18 (10/17 0519) BP: (147-182)/(57-74) 160/70 mmHg (10/17 0519) SpO2:  [89 %-99 %] 96 % (10/17 0726) Weight:  [59.875 kg (132 lb)-65 kg (143 lb 4.8 oz)] 65 kg (143 lb 4.8 oz) (10/16 1854) Weight change:  Last BM Date:  (unknown)  Intake/Output from previous day: 10/16 0701 - 10/17 0700 In: 963.8 [P.O.:180; I.V.:433.8; IV Piggyback:350] Out: 950 [Urine:950]  PHYSICAL EXAM General appearance: alert, cooperative and mild distress Resp: clear to auscultation bilaterally Cardio: regular rate and rhythm, S1, S2 normal, no murmur, click, rub or gallop GI: soft, non-tender; bowel sounds normal; no masses,  no organomegaly Extremities: Not examined secondary to fracture  Lab Results:    Basic Metabolic Panel:  Recent Labs  82/95/62 1635 11/20/12 0514  NA 137 138  K 3.1* 3.9  CL 100 103  CO2 24 25  GLUCOSE 132* 142*  BUN 15 13  CREATININE 0.68 0.67  CALCIUM 9.3 8.7   Liver Function Tests:  Recent Labs  11/20/12 0514  AST 19  ALT 10  ALKPHOS 48  BILITOT 0.8  PROT 6.2  ALBUMIN 3.1*   No results found for this basename: LIPASE, AMYLASE,  in the last 72 hours No results found for this basename: AMMONIA,  in the last 72 hours CBC:  Recent Labs  11/19/12 1635 11/20/12 0514  WBC 14.5* 8.8  NEUTROABS 12.6*  --   HGB 13.4 11.6*  HCT 40.0 35.0*  MCV 93.7 93.6  PLT 203 183   Cardiac Enzymes: No results found for this basename: CKTOTAL, CKMB, CKMBINDEX, TROPONINI,  in the last 72 hours BNP: No results found for this basename:  PROBNP,  in the last 72 hours D-Dimer: No results found for this basename: DDIMER,  in the last 72 hours CBG: No results found for this basename: GLUCAP,  in the last 72 hours Hemoglobin A1C: No results found for this basename: HGBA1C,  in the last 72 hours Fasting Lipid Panel: No results found for this basename: CHOL, HDL, LDLCALC, TRIG, CHOLHDL, LDLDIRECT,  in the last 72 hours Thyroid Function Tests: No results found for this basename: TSH, T4TOTAL, FREET4, T3FREE, THYROIDAB,  in the last 72 hours Anemia Panel: No results found for this basename: VITAMINB12, FOLATE, FERRITIN, TIBC, IRON, RETICCTPCT,  in the last 72 hours Coagulation: No results found for this basename: LABPROT, INR,  in the last 72 hours Urine Drug Screen: Drugs of Abuse  No results found for this basename: labopia, cocainscrnur, labbenz, amphetmu, thcu, labbarb    Alcohol Level: No results found for this basename: ETH,  in the last 72 hours Urinalysis: No results found for this basename: COLORURINE, APPERANCEUR, LABSPEC, PHURINE, GLUCOSEU, HGBUR, BILIRUBINUR, KETONESUR, PROTEINUR, UROBILINOGEN, NITRITE, LEUKOCYTESUR,  in the last 72 hours Misc. Labs:  ABGS No results found for this basename: PHART, PCO2, PO2ART, TCO2, HCO3,  in the last 72 hours CULTURES Recent Results (from the past 240 hour(s))  SURGICAL PCR SCREEN     Status: Abnormal   Collection Time    11/19/12 10:16 AM  Result Value Range Status   MRSA, PCR POSITIVE (*) NEGATIVE Final   Comment: RESULT CALLED TO, READ BACK BY AND VERIFIED WITH:     NEILSON T AT 0043 ON [id number] BY FORSYTH K   Staphylococcus aureus POSITIVE (*) NEGATIVE Final   Comment:            The Xpert SA Assay (FDA     approved for NASAL specimens     in patients over 65 years of age),     is one component of     a comprehensive surveillance     program.  Test performance has     been validated by [employer] for patients greater     than or equal to 8 year old.      It is not intended     to diagnose infection nor to     guide or monitor treatment.     RESULT CALLED TO, READ BACK BY AND VERIFIED WITH:     NEILSON T AT 0043 ON [id number] BY FORSYTH K   Studies/Results: Dg Chest 1 View  11/19/2012   CLINICAL DATA:  Right hip fracture.  Pre operative respiratory EXAM.  EXAM: CHEST - 1 VIEW  COMPARISON:  06/14/2012  FINDINGS: Chronic slight cardiomegaly. Pulmonary vascularity is normal and the lungs are clear. No acute osseous abnormality.  IMPRESSION: No active disease.   Electronically Signed   By: Geanie Cooley M.D.   On: 11/19/2012 17:05   Dg Femur Right  11/19/2012   CLINICAL DATA:  Fall. Right hip pain.  EXAM: RIGHT FEMUR - 2 VIEW  COMPARISON:  04/13/2009  FINDINGS: There is a comminuted, mildly displaced intertrochanteric fracture of the right proximal femur with varus angulation. There is is separate lesser trochanter fracture component that is displaced medially by 1 cm. The major fracture fragments are displaced approximately 1 cm. The hip joint is normally aligned. There are arthropathic changes of the right hip joint at have mildly advanced from the prior study.  There is an area of cortical thickening and lucency along the lateral cortical margin of the mid to distal femoral shaft. This was present on the prior study and may reflect an old insufficiency fracture but is nonspecific.  The bones are demineralized. The knee joint is normally aligned.  IMPRESSION: Mildly comminuted and displaced intertrochanteric fracture of the right proximal femur with varus angulation.   Electronically Signed   By: Amie Portland M.D.   On: 11/19/2012 17:03    Medications:  Prior to Admission:  Prescriptions prior to admission  Medication Sig Dispense Refill  . acetaminophen (TYLENOL) 500 MG tablet Take 500 mg by mouth every 6 (six) hours as needed. For pain      . aspirin EC 81 MG tablet Take 81 mg by mouth every evening.      . cholecalciferol (VITAMIN D) 1000 UNITS  tablet Take 1,000 Units by mouth 2 (two) times daily.       . clorazepate (TRANXENE) 7.5 MG tablet Take 3.75 mg by mouth 2 (two) times daily.      Marland Kitchen losartan (COZAAR) 50 MG tablet Take 50 mg by mouth daily.       Scheduled: .  ceFAZolin (ANCEF) IV  2 g Intravenous Once  . cefTRIAXone (ROCEPHIN)  IV  1 g Intravenous Q24H  . Chlorhexidine Gluconate Cloth  6 each Topical Q0600  . clorazepate  3.75 mg Oral BID  . enoxaparin (LOVENOX) injection  40  mg Subcutaneous Q24H  . losartan  50 mg Oral Daily  . metoprolol tartrate  12.5 mg Oral BID  . mupirocin ointment  1 application Nasal BID   Continuous: . sodium chloride 75 mL/hr at 11/19/12 2213   RUE:[id number], acetaminophen, morphine injection, ondansetron (ZOFRAN) IV, ondansetron  Assesment: She has an intertrochanteric fracture of the right hip. She was hypokalemic and that is improved and her potassium is normal now. She has hypertension and has been placed on Cozaar and metoprolol 12.5 mg twice a day. Her blood pressure is adequate today. I don't see any contraindications to going ahead with planned surgery Principal Problem:   Intertrochanteric fracture of right hip Active Problems:   HTN (hypertension)   UTI (urinary tract infection)    Plan: For surgery on her hip later today    LOS: 1 day   Stokes Rattigan L 11/20/2012, 8:42 AM

## 2012-11-20 NOTE — Anesthesia Postprocedure Evaluation (Signed)
  Anesthesia Post-op Note  Patient: [name]  Procedure(s) Performed: Procedure(s): OPEN REDUCTION INTERNAL FIXATION RIGHT HIP (Right)  Patient Location: PACU  Anesthesia Type:Spinal  Level of Consciousness: awake, alert , oriented and patient cooperative  Airway and Oxygen Therapy: Patient Spontanous Breathing and Patient connected to nasal cannula oxygen  Post-op Pain: none  Post-op Assessment: Post-op Vital signs reviewed, Patient's Cardiovascular Status Stable, Respiratory Function Stable, Patent Airway, No signs of Nausea or vomiting and Pain level controlled  Post-op Vital Signs: Reviewed and stable  Complications: No apparent anesthesia complications

## 2012-11-20 NOTE — Care Management Note (Addendum)
    Page 1 of 1   11/25/2012     10:22:01 AM   CARE MANAGEMENT NOTE 11/25/2012  Patient:  [name], [name]   Account Number:  [id number]  Date Initiated:  11/20/2012  Documentation initiated by:  Sharrie Rothman  Subjective/Objective Assessment:   Pt admitted from home s/p hip fracture. Pt having surgery 10/17. Pt lives alone and still was driving and fairly independent. Pt uses a cane for prn use. Pt would stay with son [name] at night.     Action/Plan:   Pt will probably need SNF at discharge. CSW aware and will initial placement process.   Anticipated DC Date:  11/23/2012   Anticipated DC Plan:  SKILLED NURSING FACILITY  In-house referral  Clinical Social Worker      DC Planning Services  CM consult      Choice offered to / List presented to:             Status of service:  Completed, signed off Medicare Important Message given?  YES (If response is "NO", the following Medicare IM given date fields will be blank) Date Medicare IM given:  11/25/2012 Date Additional Medicare IM given:    Discharge Disposition:  SKILLED NURSING FACILITY  Per UR Regulation:    If discussed at Long Length of Stay Meetings, dates discussed:   11/24/2012    Comments:  11/25/12 1020 Arlyss Queen, RN BSN CM Pt discharged to [facility] today. CSW to arrange discharge to the facility.  11/20/12 1430 Arlyss Queen, RN BSN CM

## 2012-11-20 NOTE — OR Nursing (Signed)
Gave 1mg  Versed and 75 mcg Fentanyl to Amy Admas, CRNA to be used in OR, if needed.

## 2012-11-20 NOTE — Clinical Social Work Placement (Signed)
Clinical Social Work Department CLINICAL SOCIAL WORK PLACEMENT NOTE 11/20/2012  Patient:  [name], [name]  Account Number:  [id number] Admit date:  11/19/2012  Clinical Social Worker:  Derenda Fennel, LCSW  Date/time:  11/20/2012 02:20 PM  Clinical Social Work is seeking post-discharge placement for this patient at the following level of care:   SKILLED NURSING   (*CSW will update this form in Epic as items are completed)   11/20/2012  Patient/family provided with [facility] Department of Clinical Social Work's list of facilities offering this level of care within the geographic area requested by the patient (or if unable, by the patient's family).  11/20/2012  Patient/family informed of their freedom to choose among providers that offer the needed level of care, that participate in Medicare, Medicaid or managed care program needed by the patient, have an available bed and are willing to accept the patient.  11/20/2012  Patient/family informed of [facility]' ownership interest in [facility], as well as of the fact that they are under no obligation to receive care at this facility.  PASARR submitted to EDS on  PASARR number received from EDS on   FL2 transmitted to all facilities in geographic area requested by pt/family on  11/20/2012 FL2 transmitted to all facilities within larger geographic area on   Patient informed that his/her managed care company has contracts with or will negotiate with  certain facilities, including the following:     Patient/family informed of bed offers received:   Patient chooses bed at  Physician recommends and patient chooses bed at    Patient to be transferred to  on   Patient to be transferred to facility by   The following physician request were entered in Epic:   Additional Comments: Pt has existing pasarr.  Derenda Fennel, [place] [phone]

## 2012-11-20 NOTE — Progress Notes (Signed)
INITIAL NUTRITION ASSESSMENT  DOCUMENTATION CODES Per approved criteria  -Not Applicable   INTERVENTION: Follow for diet advancement and PO intake to assess need for ONS  NUTRITION DIAGNOSIS: Inadequate oral intake related to needs for surgery as evidenced by NPO.   Goal: Pt will meet >/= 90% of estimated nutritional needs  Monitor:  Diet advancement, PO intake, labs, weight changes, skin integrity, changes in status  Reason for Assessment: MD consult for hip fx  78 y.o. female  Admitting Dx: Intertrochanteric fracture of right hip  ASSESSMENT: Pt admitted from home due to rt hip fx as a result of an unintentional fall. Pt scheduled for sx today and was not in room at time of visit. No family present either.  Chart reviewed. Wt hx reveals 7# (5.1%) wt gain year and 11# (8.3%) wt gain x 5 months. Pt with hx of wt gain, however, not statistically significant.   Height: Ht Readings from Last 1 Encounters:  11/20/12 5\' 2"  (1.575 m)    Weight: Wt Readings from Last 1 Encounters:  11/20/12 143 lb 4.8 oz (65 kg)    Ideal Body Weight: 110#  % Ideal Body Weight: 130%  Wt Readings from Last 10 Encounters:  11/20/12 143 lb 4.8 oz (65 kg)  11/20/12 143 lb 4.8 oz (65 kg)  06/14/12 132 lb (59.875 kg)  12/25/11 136 lb (61.689 kg)  03/08/11 136 lb (61.689 kg)    Usual Body Weight: 136#  % Usual Body Weight: 105%  BMI:  Body mass index is 26.2 kg/(m^2). Meets criteria for overweight.   Estimated Nutritional Needs: Kcal: 1100-1200 daily Protein: 65-81 grams daily Fluid: 1.1-1.2 L fluid daily  Skin: rt hip abrasion  Diet Order: NPO  EDUCATION NEEDS: -Education not appropriate at this time   Intake/Output Summary (Last 24 hours) at 11/20/12 1124 Last data filed at 11/20/12 0828  Gross per 24 hour  Intake 963.75 ml  Output    950 ml  Net  13.75 ml    Last BM: PTA   Labs:   Recent Labs Lab 11/19/12 1635 11/20/12 0514  NA 137 138  K 3.1* 3.9  CL 100  103  CO2 24 25  BUN 15 13  CREATININE 0.68 0.67  CALCIUM 9.3 8.7  GLUCOSE 132* 142*    CBG (last 3)  No results found for this basename: GLUCAP,  in the last 72 hours  Scheduled Meds: . [MAR HOLD]  ceFAZolin (ANCEF) IV  2 g Intravenous Once  . [MAR HOLD] cefTRIAXone (ROCEPHIN)  IV  1 g Intravenous Q24H  . [MAR HOLD] Chlorhexidine Gluconate Cloth  6 each Topical Q0600  . [facility] HOLD] clorazepate  3.75 mg Oral BID  . [MAR HOLD] enoxaparin (LOVENOX) injection  40 mg Subcutaneous Q24H  . [MAR HOLD] losartan  50 mg Oral Daily  . [facility] HOLD] metoprolol tartrate  12.5 mg Oral BID  . [facility] HOLD] mupirocin ointment  1 application Nasal BID    Continuous Infusions: . sodium chloride 75 mL/hr at 11/19/12 2213  . lactated ringers 1,000 mL (11/20/12 1105)    Past Medical History  Diagnosis Date  . Hypertension   . Osteoarthritis     Past Surgical History  Procedure Laterality Date  . Kidney stone surgery    . Stress fractures of legs    . Appendectomy    . Tubal ligation      [name] Knife, RD, LDN Pager: [phone]

## 2012-11-20 NOTE — Transfer of Care (Signed)
Immediate Anesthesia Transfer of Care Note  Patient: [name]  Procedure(s) Performed: Procedure(s): OPEN REDUCTION INTERNAL FIXATION RIGHT HIP (Right)  Patient Location: PACU  Anesthesia Type:Spinal  Level of Consciousness: awake, alert , oriented and patient cooperative  Airway & Oxygen Therapy: Patient Spontanous Breathing and Patient connected to nasal cannula oxygen  Post-op Assessment: Report given to PACU RN and Post -op Vital signs reviewed and stable  Post vital signs: Reviewed and stable  Complications: No apparent anesthesia complications

## 2012-11-20 NOTE — Progress Notes (Signed)
UR chart review completed.  

## 2012-11-20 NOTE — Clinical Social Work Psychosocial (Signed)
Clinical Social Work Department BRIEF PSYCHOSOCIAL ASSESSMENT 11/20/2012  Patient:  [name], [name]     Account Number:  [id number]     Admit date:  11/19/2012  Clinical Social Worker:  Nancie Neas  Date/Time:  11/20/2012 02:20 PM  Referred by:  Physician  Date Referred:  11/20/2012 Referred for  SNF Placement   Other Referral:   Interview type:  Family Other interview type:   children    PSYCHOSOCIAL DATA Living Status:  ALONE Admitted from facility:   Level of care:   Primary support name:  Greg/Angela Primary support relationship to patient:  CHILD, ADULT Degree of support available:   very supportive    CURRENT CONCERNS Current Concerns  Post-Acute Placement   Other Concerns:    SOCIAL WORK ASSESSMENT / PLAN CSW met with 4 of pt's 5 children outside room. Pt just arrived back from surgery. Family appear to be very involved and supportive. Pt stays alone during the day but generally spends the night with Tammy Sours who lives nearby. She was at home yesterday when she fell, fracturing her hip. Pt was able to get to the phone to call Tammy Sours who called EMS. She is independent at baseline and still driving. She occasionally uses a cane for ambulating. CSW discussed placement process as pt will most likely require SNF at d/c. Family agreeable and request PNC if possible. SNF list provided. Family notified of [facility] Medicare authorization process.   Assessment/plan status:  Psychosocial Support/Ongoing Assessment of Needs Other assessment/ plan:   Information/referral to community resources:   SNF list    PATIENT'S/FAMILY'S RESPONSE TO PLAN OF CARE: Pt unable to participate in assessment at this time. Family request for CSW to initiate bed search. CSW will follow up with bed offers when available and begin insurance authorization.       Derenda Fennel, [place] [phone]

## 2012-11-20 NOTE — Op Note (Signed)
[name], [name]                 ACCOUNT NO.:  [id number]  MEDICAL RECORD NO.:  [id number]  LOCATION:  APPO                          FACILITY:  [facility]  PHYSICIAN:  J. Darreld Mclean, M.D. DATE OF BIRTH:  [birth year]  DATE OF PROCEDURE: DATE OF DISCHARGE:                              OPERATIVE REPORT   PREOPERATIVE DIAGNOSIS:  Intertrochanteric fracture of the hip on the right.  POSTOPERATIVE DIAGNOSIS:  Intertrochanteric fracture of the hip on the right.  PROCEDURE:  Open treatment internal reduction of right hip fracture using a Smith [employer]hew hip compression screw system with an 80 mm long compression screw, 135 degree short barrel 4-hole side plate.  Screws measured from 48-40 mm.  ANESTHESIA:  Spinal.  SURGEON:  J. Darreld Mclean, M.D.  ASSISTANT:  Natalia Leatherwood Page, RN.  DRAIN:  One large Hemovac drain.  ESTIMATED BLOOD LOSS:  150 mL.  None replaced.  INDICATIONS:  The patient fell yesterday at her home and sustained the above mentioned injury.  There was no other apparent injury.  The patient was seen and evaluated by me and by the hospitalist.  She was admitted to the hospitalist service.  I explained her risks and imponderables of the procedure.  The patient understands and appeared to understand and asked appropriate questions.  DESCRIPTION OF PROCEDURE:  The patient was seen in the holding area. The right hip was identified as the correct surgical site, a mark was placed in the right hip.  She was taken back to the operating room and given spinal anesthesia.  She was then transferred to the fracture table.  C-arm fluoroscopy unit was brought in.  Everyone had on lead aprons, plate, shields, badges.  X-rays showed reduction of the hip. The patient was prepped and draped in usual manner.  A generalized time- out identifying the patient as [name] and we are doing the right hip for intertrochanteric fracture of the hip.  All instrumentation was properly positioned  and working.  Everyone know each other.  Incision was made through skin, subcutaneous tissue, tensor fascia lata to the vastus lateralis.  Guide pin was placed with good AP and lateral views.  It measured between 80 and 85 mm, so I selected an 80 mm compression screw.  Step drill was used.  An 80 mm compression screw was inserted with a 4-hole short barrel 135 degree side plate.  Compression was applied to the system.  Four screws were used.  These measure from 48-40 mm.  Permanent x-rays were taken.  Hemovac drain was placed, sewn with 2-0 silk suture.  Vastus lateralis reapproximated using a running locking #1 Surgilon suture.  Tensor fascia lata reapproximated with interrupted figure-of-eight #1 Surgilon suture.  Subcutaneous tissue reapproximated using 2-0 plain and skin reapproximated using skin staples.  Sterile dressing applied.  Bulky dressing applied.  Patient tolerated the procedure well, will go to recovery in good condition.          ______________________________ Shela Commons. Darreld Mclean, M.D.     JWK/MEDQ  D:  11/20/2012  T:  11/20/2012  Job:  [id number]

## 2012-11-20 NOTE — Clinical Social Work Placement (Signed)
Clinical Social Work Department CLINICAL SOCIAL WORK PLACEMENT NOTE 11/20/2012  Patient:  [name], [name]  Account Number:  [id number] Admit date:  11/19/2012  Clinical Social Worker:  Derenda Fennel, LCSW  Date/time:  11/20/2012 02:20 PM  Clinical Social Work is seeking post-discharge placement for this patient at the following level of care:   SKILLED NURSING   (*CSW will update this form in Epic as items are completed)   11/20/2012  Patient/family provided with [facility] Department of Clinical Social Work's list of facilities offering this level of care within the geographic area requested by the patient (or if unable, by the patient's family).  11/20/2012  Patient/family informed of their freedom to choose among providers that offer the needed level of care, that participate in Medicare, Medicaid or managed care program needed by the patient, have an available bed and are willing to accept the patient.  11/20/2012  Patient/family informed of [facility]' ownership interest in [facility], as well as of the fact that they are under no obligation to receive care at this facility.  PASARR submitted to [employer] on  PASARR number received from [employer] on   FL2 transmitted to all facilities in geographic area requested by pt/family on  11/20/2012 FL2 transmitted to all facilities within larger geographic area on   Patient informed that his/her managed care company has contracts with or will negotiate with  certain facilities, including the following:     Patient/family informed of bed offers received:  11/20/2012 Patient chooses bed at [facility] Physician recommends and patient chooses bed at  [facility]  Patient to be transferred to  on   Patient to be transferred to facility by   The following physician request were entered in Epic:   Additional Comments: Pt has existing pasarr.  Derenda Fennel, [place] [phone]

## 2012-11-20 NOTE — Anesthesia Procedure Notes (Signed)
Spinal  Patient location during procedure: OR Staffing CRNA/Resident: ANDRAZA, Shafin Pollio L Preanesthetic Checklist Completed: patient identified, site marked, surgical consent, pre-op evaluation, timeout performed, IV checked, risks and benefits discussed and monitors and equipment checked Spinal Block Patient position: right lateral decubitus Prep: Betadine Patient monitoring: heart rate, cardiac monitor, continuous pulse ox and blood pressure Approach: right paramedian Location: L3-4 Injection technique: single-shot Needle Needle type: Spinocan  Needle gauge: 22 G Needle length: 9 cm Assessment Sensory level: T8 Additional Notes ATTEMPTS:1 TRAY ZO:[id number] TRAY EXPIRATION DATE:01/2013 Marcaine 13mg , Fentanyl 20 mcg, epi .1 injected intrathecally 1129; patient tolerated well.

## 2012-11-20 NOTE — Plan of Care (Signed)
Dr. [name] called and LM on cell to try to maybe get order for ativan per pt's request.

## 2012-11-20 NOTE — Clinical Social Work Note (Signed)
CSW presented bed offer at [facility] which was family's first choice and they accept. Facility notified. CSW will follow up on Monday to continue working on insurance authorization.  Derenda Fennel, [place] [phone]

## 2012-11-20 NOTE — Anesthesia Preprocedure Evaluation (Signed)
Anesthesia Evaluation  Patient identified by MRN, date of birth, ID band Patient awake    Reviewed: Allergy & Precautions, H&P , NPO status , Patient's Chart, lab work & pertinent test results  Airway Mallampati: I TM Distance: >3 FB     Dental  (+) Teeth Intact   Pulmonary neg pulmonary ROS, shortness of breath,  breath sounds clear to auscultation        Cardiovascular hypertension, Pt. on medications Rhythm:Regular Rate:Normal     Neuro/Psych    GI/Hepatic GERD-  ,  Endo/Other    Renal/GU      Musculoskeletal   Abdominal   Peds  Hematology   Anesthesia Other Findings   Reproductive/Obstetrics                           Anesthesia Physical Anesthesia Plan  ASA: II  Anesthesia Plan: Spinal   Post-op Pain Management:    Induction:   Airway Management Planned: Nasal Cannula  Additional Equipment:   Intra-op Plan:   Post-operative Plan:   Informed Consent: I have reviewed the patients History and Physical, chart, labs and discussed the procedure including the risks, benefits and alternatives for the proposed anesthesia with the patient or authorized representative who has indicated his/her understanding and acceptance.     Plan Discussed with:   Anesthesia Plan Comments:         Anesthesia Quick Evaluation

## 2012-11-20 NOTE — Brief Op Note (Signed)
11/19/2012 - 11/20/2012  12:42 PM  PATIENT:  [name]  79 y.o. female  PRE-OPERATIVE DIAGNOSIS:  Intertrochanteric Fracture Right Hip  POST-OPERATIVE DIAGNOSIS:  Intertrochanteric Fracture Right Hip  PROCEDURE:  Procedure(s): OPEN REDUCTION INTERNAL FIXATION RIGHT HIP (Right)  SURGEON:  Surgeon(s) and Role:    * Darreld Mclean, MD - Primary  PHYSICIAN ASSISTANT:   ASSISTANTS: C. Page   ANESTHESIA:   spinal  EBL:  Total I/O In: 500 [I.V.:500] Out: 600 [Urine:500; Blood:100]  BLOOD ADMINISTERED:none  DRAINS: (Large) Hemovact drain(s) in the right hip area with  Suction Open   LOCAL MEDICATIONS USED:  NONE  SPECIMEN:  No Specimen  DISPOSITION OF SPECIMEN:  N/A  COUNTS:  YES  TOURNIQUET:  * No tourniquets in log *  DICTATION: .Other Dictation: Dictation Number [id number]  PLAN OF CARE: Admit to inpatient   PATIENT DISPOSITION:  PACU - hemodynamically stable.   Delay start of Pharmacological VTE agent (>24hrs) due to surgical blood loss or risk of bleeding: no

## 2012-11-20 NOTE — Plan of Care (Signed)
OR RN called up to floor and requested that bactroban be brought to OR to administer, but pt was given at 0500 today. Bactroban taking to OR by PCT.

## 2012-11-20 NOTE — Preoperative (Signed)
Beta Blockers   Reason not to administer Beta Blockers:Not Applicable 

## 2012-11-21 LAB — CBC WITH DIFFERENTIAL/PLATELET
Basophils Absolute: 0 10*3/uL (ref 0.0–0.1)
Basophils Relative: 0 % (ref 0–1)
Eosinophils Absolute: 0 10*3/uL (ref 0.0–0.7)
Eosinophils Relative: 0 % (ref 0–5)
HCT: 26.5 % — ABNORMAL LOW (ref 36.0–46.0)
Hemoglobin: 8.8 g/dL — ABNORMAL LOW (ref 12.0–15.0)
Lymphocytes Relative: 10 % — ABNORMAL LOW (ref 12–46)
MCH: 31.1 pg (ref 26.0–34.0)
MCHC: 33.2 g/dL (ref 30.0–36.0)
Monocytes Absolute: 1.5 10*3/uL — ABNORMAL HIGH (ref 0.1–1.0)
Monocytes Relative: 16 % — ABNORMAL HIGH (ref 3–12)
Neutro Abs: 6.9 10*3/uL (ref 1.7–7.7)
RDW: 13.3 % (ref 11.5–15.5)

## 2012-11-21 LAB — BASIC METABOLIC PANEL
BUN: 8 mg/dL (ref 6–23)
Calcium: 8.4 mg/dL (ref 8.4–10.5)
Chloride: 102 mEq/L (ref 96–112)
Creatinine, Ser: 0.55 mg/dL (ref 0.50–1.10)
GFR calc Af Amer: >90 mL/min (ref >90)
Potassium: 3.4 mEq/L — ABNORMAL LOW (ref 3.5–5.1)

## 2012-11-21 LAB — URINE CULTURE: Colony Count: NO GROWTH

## 2012-11-21 LAB — ABO/RH: ABO/RH(D): AB POS

## 2012-11-21 NOTE — Anesthesia Postprocedure Evaluation (Signed)
  Anesthesia Post-op Note  Patient: [name]  Procedure(s) Performed: Procedure(s): OPEN REDUCTION INTERNAL FIXATION RIGHT HIP (Right)  Patient Location: Room 307  Anesthesia Type:Spinal  Level of Consciousness: sedated and patient cooperative  Airway and Oxygen Therapy: Patient Spontanous Breathing and Patient connected to nasal cannula oxygen  Post-op Pain: moderate  Post-op Assessment: Post-op Vital signs reviewed, Patient's Cardiovascular Status Stable, Respiratory Function Stable, Patent Airway, No signs of Nausea or vomiting and Pain level controlled  Post-op Vital Signs: Reviewed and stable  Complications: No apparent anesthesia complications

## 2012-11-21 NOTE — Evaluation (Signed)
Physical Therapy Evaluation Patient Details Name: [name] MRN: [id number] DOB: 06/30/1928 Today's Date: 11/21/2012 Time: 4098-1191 PT Time Calculation (min): 42 min  PT Assessment / Plan / Recommendation History of Present Illness   Pt is an 79 yo female who is normally mod I in ambulation with a cane.  Pt lives alone at home but sleeps over at her son's house.  Pt was in the kitchen making a hamburger when she fell and fractured her Rt hip.  Pt is now in need of skilled care to return her to her previous functional level.  Clinical Impression  Pt is lethargic from medication.  Continues to dose in and out throughout therapy treatment but is very cooperative when awoken and asked to complete a task.  Pt has decreased mobility and strength and will need skilled care to return her to her normal ambulatory state.    PT Assessment  Patient needs continued PT services    Follow Up Recommendations  SNF    Does the patient have the potential to tolerate intense rehabilitation    no  Barriers to Discharge Decreased caregiver support      Equipment Recommendations  None recommended by PT    Recommendations for Other Services OT consult   Frequency Min 5X/week    Precautions / Restrictions Precautions Precautions: Fall Restrictions Weight Bearing Restrictions: Yes RLE Weight Bearing: Partial weight bearing   Pertinent Vitals/Pain Only upon movement      Mobility  Bed Mobility Details for Bed Mobility Assistance: attempted to get pt to sitting pt in too much pain and too lethargic unable to respond to commands     Exercises General Exercises - Upper Extremity Shoulder Flexion: Both;5 reps Elbow Flexion: Both;5 reps Elbow Extension: Both;5 reps General Exercises - Lower Extremity Ankle Circles/Pumps: AAROM;Both;10 reps Quad Sets: AROM;Both;5 reps Gluteal Sets: AROM;Both;5 reps Heel Slides: AAROM;Both;5 reps   PT Diagnosis: Difficulty walking;Generalized  weakness;Acute pain  PT Problem List: Decreased strength;Decreased range of motion;Decreased activity tolerance;Decreased balance;Decreased mobility;Decreased knowledge of use of DME PT Treatment Interventions: Gait training;Functional mobility training;Therapeutic activities;Therapeutic exercise     PT Goals(Current goals can be found in the care plan section) Acute Rehab PT Goals PT Goal Formulation: With patient/family Time For Goal Achievement: 11/18/12 Potential to Achieve Goals: Good  Visit Information  Last PT Received On: 11/21/12       Prior Functioning  Home Living Family/patient expects to be discharged to:: Private residence Living Arrangements: Alone (went to son's at night to sleep) Available Help at Discharge: Family Type of Home: House Home Access: Stairs to enter Secretary/administrator of Steps: 2 Entrance Stairs-Rails: Right Home Layout: One level Home Equipment: Cane - single point Prior Function Level of Independence: Independent with assistive device(s) Communication Communication: No difficulties    Cognition  Cognition Arousal/Alertness: Lethargic Overall Cognitive Status: Within Functional Limits for tasks assessed    Extremity/Trunk Assessment Lower Extremity Assessment Lower Extremity Assessment: RLE deficits/detail;LLE deficits/detail RLE: Unable to fully assess due to pain LLE Deficits / Details: generallly 2/5   Balance    End of Session PT - End of Session Activity Tolerance: Patient limited by lethargy;Patient limited by pain Patient left: in bed;with bed alarm set;with family/visitor present  GP     Jahziel Sinn,CINDY 11/21/2012, 11:12 AM

## 2012-11-21 NOTE — Progress Notes (Signed)
Subjective: 1 Day Post-Op Procedure(s) (LRB): OPEN REDUCTION INTERNAL FIXATION RIGHT HIP (Right) Patient reports pain as 5 on 0-10 scale.    Objective: Vital signs in last 24 hours: Temp:  [96.7 F (35.9 C)-98.9 F (37.2 C)] 98.3 F (36.8 C) (10/18 0213) Pulse Rate:  [64-92] 92 (10/18 0213) Resp:  [15-27] 20 (10/18 0328) BP: (112-176)/(40-77) 159/68 mmHg (10/18 0213) SpO2:  [92 %-99 %] 93 % (10/18 0328) Weight:  [65 kg (143 lb 4.8 oz)] 65 kg (143 lb 4.8 oz) (10/17 1040)  Intake/Output from previous day: 10/17 0701 - 10/18 0700 In: 2910 [P.O.:560; I.V.:2300; IV Piggyback:50] Out: 1510 [Urine:1350; Drains:60; Blood:100] Intake/Output this shift:     Recent Labs  11/19/12 1635 11/20/12 0514 11/21/12 0612  HGB 13.4 11.6* 8.8*    Recent Labs  11/20/12 0514 11/21/12 0612  WBC 8.8 9.4  RBC 3.74* 2.83*  HCT 35.0* 26.5*  PLT 183 127*    Recent Labs  11/20/12 0514 11/21/12 0612  NA 138 135  K 3.9 3.4*  CL 103 102  CO2 25 27  BUN 13 8  CREATININE 0.67 0.55  GLUCOSE 142* 147*  CALCIUM 8.7 8.4   No results found for this basename: LABPT, INR,  in the last 72 hours  Neurologically intact Neurovascular intact Sensation intact distally Intact pulses distally Dorsiflexion/Plantar flexion intact  She had restless night.  She is sleeping on and off now.  To begin physical therapy today.  I will transfuse two units of blood today.  Assessment/Plan: 1 Day Post-Op Procedure(s) (LRB): OPEN REDUCTION INTERNAL FIXATION RIGHT HIP (Right) Up with therapy  [name] 11/21/2012, 9:41 AM

## 2012-11-21 NOTE — Progress Notes (Signed)
Subjective: She had surgery yesterday and says she's doing okay. She still has some residual pain in her hip but not much. She's sleepy this morning but family says that she ate a little bit of breakfast.  Objective: Vital signs in last 24 hours: Temp:  [96.7 F (35.9 C)-98.9 F (37.2 C)] 98.3 F (36.8 C) (10/18 0213) Pulse Rate:  [64-92] 92 (10/18 0213) Resp:  [15-27] 20 (10/18 0328) BP: (112-176)/(40-77) 159/68 mmHg (10/18 0213) SpO2:  [92 %-99 %] 93 % (10/18 0328) Weight:  [65 kg (143 lb 4.8 oz)] 65 kg (143 lb 4.8 oz) (10/17 1040) Weight change: 5.126 kg (11 lb 4.8 oz) Last BM Date: 11/19/12  Intake/Output from previous day: 10/17 0701 - 10/18 0700 In: 2910 [P.O.:560; I.V.:2300; IV Piggyback:50] Out: 1510 [Urine:1350; Drains:60; Blood:100]  PHYSICAL EXAM General appearance: Sleepy but will awaken and talk to me. she looks much more comfortable Resp: clear to auscultation bilaterally Cardio: regular rate and rhythm, S1, S2 normal, no murmur, click, rub or gallop GI: soft, non-tender; bowel sounds normal; no masses,  no organomegaly Extremities: She does not have any edema  Lab Results:    Basic Metabolic Panel:  Recent Labs  16/10/96 0514 11/21/12 0612  NA 138 135  K 3.9 3.4*  CL 103 102  CO2 25 27  GLUCOSE 142* 147*  BUN 13 8  CREATININE 0.67 0.55  CALCIUM 8.7 8.4   Liver Function Tests:  Recent Labs  11/20/12 0514  AST 19  ALT 10  ALKPHOS 48  BILITOT 0.8  PROT 6.2  ALBUMIN 3.1*   No results found for this basename: LIPASE, AMYLASE,  in the last 72 hours No results found for this basename: AMMONIA,  in the last 72 hours CBC:  Recent Labs  11/19/12 1635 11/20/12 0514 11/21/12 0612  WBC 14.5* 8.8 9.4  NEUTROABS 12.6*  --  6.9  HGB 13.4 11.6* 8.8*  HCT 40.0 35.0* 26.5*  MCV 93.7 93.6 93.6  PLT 203 183 127*   Cardiac Enzymes: No results found for this basename: CKTOTAL, CKMB, CKMBINDEX, TROPONINI,  in the last 72 hours BNP: No results found  for this basename: PROBNP,  in the last 72 hours D-Dimer: No results found for this basename: DDIMER,  in the last 72 hours CBG: No results found for this basename: GLUCAP,  in the last 72 hours Hemoglobin A1C: No results found for this basename: HGBA1C,  in the last 72 hours Fasting Lipid Panel: No results found for this basename: CHOL, HDL, LDLCALC, TRIG, CHOLHDL, LDLDIRECT,  in the last 72 hours Thyroid Function Tests: No results found for this basename: TSH, T4TOTAL, FREET4, T3FREE, THYROIDAB,  in the last 72 hours Anemia Panel: No results found for this basename: VITAMINB12, FOLATE, FERRITIN, TIBC, IRON, RETICCTPCT,  in the last 72 hours Coagulation: No results found for this basename: LABPROT, INR,  in the last 72 hours Urine Drug Screen: Drugs of Abuse  No results found for this basename: labopia, cocainscrnur, labbenz, amphetmu, thcu, labbarb    Alcohol Level: No results found for this basename: ETH,  in the last 72 hours Urinalysis: No results found for this basename: COLORURINE, APPERANCEUR, LABSPEC, PHURINE, GLUCOSEU, HGBUR, BILIRUBINUR, KETONESUR, PROTEINUR, UROBILINOGEN, NITRITE, LEUKOCYTESUR,  in the last 72 hours Misc. Labs:  ABGS No results found for this basename: PHART, PCO2, PO2ART, TCO2, HCO3,  in the last 72 hours CULTURES Recent Results (from the past 240 hour(s))  SURGICAL PCR SCREEN     Status: Abnormal   Collection  Time    11/19/12 10:16 AM      Result Value Range Status   MRSA, PCR POSITIVE (*) NEGATIVE Final   Comment: RESULT CALLED TO, READ BACK BY AND VERIFIED WITH:     NEILSON T AT 0043 ON [id number] BY FORSYTH K   Staphylococcus aureus POSITIVE (*) NEGATIVE Final   Comment:            The Xpert SA Assay (FDA     approved for NASAL specimens     in patients over 61 years of age),     is one component of     a comprehensive surveillance     program.  Test performance has     been validated by [employer] for patients greater     than or  equal to 63 year old.     It is not intended     to diagnose infection nor to     guide or monitor treatment.     RESULT CALLED TO, READ BACK BY AND VERIFIED WITH:     NEILSON T AT 0043 ON [id number] BY FORSYTH K   Studies/Results: Dg Chest 1 View  11/19/2012   CLINICAL DATA:  Right hip fracture.  Pre operative respiratory EXAM.  EXAM: CHEST - 1 VIEW  COMPARISON:  06/14/2012  FINDINGS: Chronic slight cardiomegaly. Pulmonary vascularity is normal and the lungs are clear. No acute osseous abnormality.  IMPRESSION: No active disease.   Electronically Signed   By: Geanie Cooley M.D.   On: 11/19/2012 17:05   Dg Hip Operative Right  11/20/2012   CLINICAL DATA:  Right intertrochanteric fracture  EXAM: DG OPERATIVE RIGHT HIP  TECHNIQUE: A single spot fluoroscopic AP image of the right hip is submitted.  COMPARISON:  11/19/2012  FINDINGS: Three intraoperative spot images demonstrate internal fixation across the right femoral intertrochanteric fracture. No hardware complicating feature. Normal alignment.  IMPRESSION: Internal fixation of the right proximal femoral fracture. No visible complicating feature.   Electronically Signed   By: Charlett Nose M.D.   On: 11/20/2012 15:25   Dg Femur Right  11/19/2012   CLINICAL DATA:  Fall. Right hip pain.  EXAM: RIGHT FEMUR - 2 VIEW  COMPARISON:  04/13/2009  FINDINGS: There is a comminuted, mildly displaced intertrochanteric fracture of the right proximal femur with varus angulation. There is is separate lesser trochanter fracture component that is displaced medially by 1 cm. The major fracture fragments are displaced approximately 1 cm. The hip joint is normally aligned. There are arthropathic changes of the right hip joint at have mildly advanced from the prior study.  There is an area of cortical thickening and lucency along the lateral cortical margin of the mid to distal femoral shaft. This was present on the prior study and may reflect an old insufficiency fracture but  is nonspecific.  The bones are demineralized. The knee joint is normally aligned.  IMPRESSION: Mildly comminuted and displaced intertrochanteric fracture of the right proximal femur with varus angulation.   Electronically Signed   By: Amie Portland M.D.   On: 11/19/2012 17:03    Medications:  Scheduled: . cefTRIAXone (ROCEPHIN)  IV  1 g Intravenous Q24H  . Chlorhexidine Gluconate Cloth  6 each Topical Q0600  . clorazepate  3.75 mg Oral BID  . enoxaparin (LOVENOX) injection  40 mg Subcutaneous Q24H  . losartan  50 mg Oral Daily  . metoprolol tartrate  12.5 mg Oral BID  . mupirocin ointment  1 application Nasal BID   Continuous: . sodium chloride 75 mL/hr at 11/19/12 2213   ZOX:[id number], acetaminophen, albuterol, fentaNYL, LORazepam, LORazepam, magnesium hydroxide, morphine injection, ondansetron (ZOFRAN) IV, ondansetron, promethazine  Assesment: She had an intertrochanteric fracture of the right hip and has had surgery. She has hypertension which is better controlled. She was hypokalemic but that has been treated and resolved. She has a urinary tract infection which is being treated overall she is much better Principal Problem:   Intertrochanteric fracture of right hip Active Problems:   HTN (hypertension)   UTI (urinary tract infection)    Plan: No change in treatments    LOS: 2 days   Markel Mergenthaler L 11/21/2012, 9:42 AM

## 2012-11-22 LAB — BASIC METABOLIC PANEL
CO2: 26 mEq/L (ref 19–32)
Calcium: 8.1 mg/dL — ABNORMAL LOW (ref 8.4–10.5)
Chloride: 103 mEq/L (ref 96–112)
GFR calc Af Amer: >90 mL/min (ref >90)
Sodium: 137 mEq/L (ref 135–145)

## 2012-11-22 LAB — CBC WITH DIFFERENTIAL/PLATELET
Basophils Absolute: 0 10*3/uL (ref 0.0–0.1)
Basophils Relative: 0 % (ref 0–1)
Eosinophils Absolute: 0.1 10*3/uL (ref 0.0–0.7)
HCT: 32.3 % — ABNORMAL LOW (ref 36.0–46.0)
Hemoglobin: 11 g/dL — ABNORMAL LOW (ref 12.0–15.0)
MCV: 90 fL (ref 78.0–100.0)
Monocytes Absolute: 1.4 10*3/uL — ABNORMAL HIGH (ref 0.1–1.0)
Monocytes Relative: 13 % — ABNORMAL HIGH (ref 3–12)
Neutro Abs: 8 10*3/uL — ABNORMAL HIGH (ref 1.7–7.7)
Platelets: 115 10*3/uL — ABNORMAL LOW (ref 150–400)
RBC: 3.59 MIL/uL — ABNORMAL LOW (ref 3.87–5.11)
WBC: 10.5 10*3/uL (ref 4.0–10.5)

## 2012-11-22 MED ORDER — TRAMADOL HCL 50 MG PO TABS
50.0000 mg | ORAL_TABLET | Freq: Three times a day (TID) | ORAL | Status: DC | PRN
  Administered 2012-11-22 – 2012-11-25 (×6): 50 mg via ORAL
  Filled 2012-11-22 (×6): qty 1

## 2012-11-22 MED ORDER — POTASSIUM CHLORIDE CRYS ER 20 MEQ PO TBCR
20.0000 meq | EXTENDED_RELEASE_TABLET | Freq: Two times a day (BID) | ORAL | Status: DC
  Administered 2012-11-22 – 2012-11-25 (×7): 20 meq via ORAL
  Filled 2012-11-22 (×7): qty 1

## 2012-11-22 NOTE — Plan of Care (Signed)
Spoke with Dr. Janna Arch to request supplemental pain med to keep from giving morphine as regularly.  Verbal orders given for Tramodol and labs to be run.  Labs already ordered for today.

## 2012-11-22 NOTE — Progress Notes (Signed)
Subjective: She says she's doing well. She has no new complaints. She has been able to eat.  Objective: Vital signs in last 24 hours: Temp:  [97.5 F (36.4 C)-99.7 F (37.6 C)] 99.3 F (37.4 C) (10/19 0547) Pulse Rate:  [94-116] 98 (10/19 0547) Resp:  [14-18] 16 (10/19 0547) BP: (120-152)/(56-84) 148/62 mmHg (10/19 0808) SpO2:  [94 %-99 %] 97 % (10/19 0547) Weight change:  Last BM Date: 11/19/12  Intake/Output from previous day: 10/18 0701 - 10/19 0700 In: 4525 [P.O.:350; I.V.:3450; Blood:675; IV Piggyback:50] Out: 1180 [Urine:1150; Drains:30]  PHYSICAL EXAM General appearance: alert, cooperative and no distress Resp: clear to auscultation bilaterally Cardio: regular rate and rhythm, S1, S2 normal, no murmur, click, rub or gallop GI: soft, non-tender; bowel sounds normal; no masses,  no organomegaly Extremities: She has on sequential compression devices but no edema  Lab Results:    Basic Metabolic Panel:  Recent Labs  56/21/30 0612 11/22/12 0605  NA 135 137  K 3.4* 3.2*  CL 102 103  CO2 27 26  GLUCOSE 147* 134*  BUN 8 9  CREATININE 0.55 0.52  CALCIUM 8.4 8.1*   Liver Function Tests:  Recent Labs  11/20/12 0514  AST 19  ALT 10  ALKPHOS 48  BILITOT 0.8  PROT 6.2  ALBUMIN 3.1*   No results found for this basename: LIPASE, AMYLASE,  in the last 72 hours No results found for this basename: AMMONIA,  in the last 72 hours CBC:  Recent Labs  11/21/12 0612 11/22/12 0605  WBC 9.4 10.5  NEUTROABS 6.9 8.0*  HGB 8.8* 11.0*  HCT 26.5* 32.3*  MCV 93.6 90.0  PLT 127* 115*   Cardiac Enzymes: No results found for this basename: CKTOTAL, CKMB, CKMBINDEX, TROPONINI,  in the last 72 hours BNP: No results found for this basename: PROBNP,  in the last 72 hours D-Dimer: No results found for this basename: DDIMER,  in the last 72 hours CBG: No results found for this basename: GLUCAP,  in the last 72 hours Hemoglobin A1C: No results found for this basename:  HGBA1C,  in the last 72 hours Fasting Lipid Panel: No results found for this basename: CHOL, HDL, LDLCALC, TRIG, CHOLHDL, LDLDIRECT,  in the last 72 hours Thyroid Function Tests: No results found for this basename: TSH, T4TOTAL, FREET4, T3FREE, THYROIDAB,  in the last 72 hours Anemia Panel: No results found for this basename: VITAMINB12, FOLATE, FERRITIN, TIBC, IRON, RETICCTPCT,  in the last 72 hours Coagulation: No results found for this basename: LABPROT, INR,  in the last 72 hours Urine Drug Screen: Drugs of Abuse  No results found for this basename: labopia, cocainscrnur, labbenz, amphetmu, thcu, labbarb    Alcohol Level: No results found for this basename: ETH,  in the last 72 hours Urinalysis: No results found for this basename: COLORURINE, APPERANCEUR, LABSPEC, PHURINE, GLUCOSEU, HGBUR, BILIRUBINUR, KETONESUR, PROTEINUR, UROBILINOGEN, NITRITE, LEUKOCYTESUR,  in the last 72 hours Misc. Labs:  ABGS No results found for this basename: PHART, PCO2, PO2ART, TCO2, HCO3,  in the last 72 hours CULTURES Recent Results (from the past 240 hour(s))  SURGICAL PCR SCREEN     Status: Abnormal   Collection Time    11/19/12 10:16 AM      Result Value Range Status   MRSA, PCR POSITIVE (*) NEGATIVE Final   Comment: RESULT CALLED TO, READ BACK BY AND VERIFIED WITH:     NEILSON T AT 0043 ON [id number] BY FORSYTH K   Staphylococcus aureus POSITIVE (*) NEGATIVE  Final   Comment:            The Xpert SA Assay (FDA     approved for NASAL specimens     in patients over 42 years of age),     is one component of     a comprehensive surveillance     program.  Test performance has     been validated by [employer] for patients greater     than or equal to 109 year old.     It is not intended     to diagnose infection nor to     guide or monitor treatment.     RESULT CALLED TO, READ BACK BY AND VERIFIED WITH:     NEILSON T AT 0043 ON [id number] BY FORSYTH K  URINE CULTURE     Status: None    Collection Time    11/19/12 10:24 PM      Result Value Range Status   Specimen Description URINE, CATHETERIZED   Final   Special Requests NONE   Final   Culture  Setup Time     Final   Value: 11/19/2012 23:00     Performed at [facility] Count     Final   Value: NO GROWTH     Performed at [facility]   Culture     Final   Value: NO GROWTH     Performed at [facility]   Report Status 11/21/2012 FINAL   Final   Studies/Results: Dg Hip Operative Right  11/20/2012   CLINICAL DATA:  Right intertrochanteric fracture  EXAM: DG OPERATIVE RIGHT HIP  TECHNIQUE: A single spot fluoroscopic AP image of the right hip is submitted.  COMPARISON:  11/19/2012  FINDINGS: Three intraoperative spot images demonstrate internal fixation across the right femoral intertrochanteric fracture. No hardware complicating feature. Normal alignment.  IMPRESSION: Internal fixation of the right proximal femoral fracture. No visible complicating feature.   Electronically Signed   By: Charlett Nose M.D.   On: 11/20/2012 15:25    Medications:  Prior to Admission:  Prescriptions prior to admission  Medication Sig Dispense Refill  . acetaminophen (TYLENOL) 500 MG tablet Take 500 mg by mouth every 6 (six) hours as needed. For pain      . aspirin EC 81 MG tablet Take 81 mg by mouth every evening.      . cholecalciferol (VITAMIN D) 1000 UNITS tablet Take 1,000 Units by mouth 2 (two) times daily.       . clorazepate (TRANXENE) 7.5 MG tablet Take 3.75 mg by mouth 2 (two) times daily.      Marland Kitchen losartan (COZAAR) 50 MG tablet Take 50 mg by mouth daily.       Scheduled: . cefTRIAXone (ROCEPHIN)  IV  1 g Intravenous Q24H  . Chlorhexidine Gluconate Cloth  6 each Topical Q0600  . clorazepate  3.75 mg Oral BID  . enoxaparin (LOVENOX) injection  40 mg Subcutaneous Q24H  . losartan  50 mg Oral Daily  . metoprolol tartrate  12.5 mg Oral BID  . mupirocin ointment  1 application Nasal BID  . potassium  chloride  20 mEq Oral BID   Continuous: . sodium chloride 75 mL/hr at 11/19/12 2213   EAV:[id number], acetaminophen, albuterol, fentaNYL, LORazepam, LORazepam, magnesium hydroxide, morphine injection, ondansetron (ZOFRAN) IV, ondansetron, promethazine, traMADol  Assesment: She was admitted with intertrochanteric fracture of the right hip which has been repaired. She  had pretty severe hypokalemia and she is hypokalemic again this morning. She has hypertension which is stable Principal Problem:   Intertrochanteric fracture of right hip Active Problems:   HTN (hypertension)   UTI (urinary tract infection)    Plan: She will have potassium replacement. She will continue with her other medications.    LOS: 3 days   Thaxton Pelley L 11/22/2012, 9:33 AM

## 2012-11-22 NOTE — Plan of Care (Signed)
Noticed pt's dressing and drain were leaking.  Held pressure on both sites for about 30 minutes and was able to stop.  Re-dressed hip and hemovac drain site for guaze and foam tape.  Cleaned up pt and did full bed change.

## 2012-11-22 NOTE — Plan of Care (Addendum)
PT called and LM to request that pt be seen today since ortho pt.  Waiting on response. LM on PT supervisors line - requesting pt to be seen today. ([phone]). LM @ [phone] and [phone] attempting to get pt seen today, per family's request.  I was informed by operator that these are the only 2 #'s they have for PT and PT does not answer calls or work on Sundays - even for an ortho pt?  Difficult to understand that noone is on call?

## 2012-11-22 NOTE — Plan of Care (Signed)
Hemovac drain was pulled by Dr. And pt drain site was found to be heavily bleeding by RN .  RN and tech held pressure for 1+ hour and finally re-bandaged drain site and hip with metapore stretch dressing.

## 2012-11-22 NOTE — Plan of Care (Signed)
Dr. Janna Arch called and told of pt's fever of 100.9.  No new orders at this time.  Tylenol 650 was given.

## 2012-11-22 NOTE — Progress Notes (Signed)
Subjective: 2 Days Post-Op Procedure(s) (LRB): OPEN REDUCTION INTERNAL FIXATION RIGHT HIP (Right) Patient reports pain as 4 on 0-10 scale.    Objective: Vital signs in last 24 hours: Temp:  [97.5 F (36.4 C)-99.7 F (37.6 C)] 99.3 F (37.4 C) (10/19 0547) Pulse Rate:  [94-116] 98 (10/19 0547) Resp:  [14-18] 16 (10/19 0547) BP: (122-152)/(56-84) 148/62 mmHg (10/19 0808) SpO2:  [94 %-99 %] 97 % (10/19 0547)  Intake/Output from previous day: 10/18 0701 - 10/19 0700 In: 4525 [P.O.:350; I.V.:3450; Blood:675; IV Piggyback:50] Out: 1180 [Urine:1150; Drains:30] Intake/Output this shift: Total I/O In: 250 [P.O.:250] Out: -    Recent Labs  11/19/12 1635 11/20/12 0514 11/21/12 0612 11/22/12 0605  HGB 13.4 11.6* 8.8* 11.0*    Recent Labs  11/21/12 0612 11/22/12 0605  WBC 9.4 10.5  RBC 2.83* 3.59*  HCT 26.5* 32.3*  PLT 127* 115*    Recent Labs  11/21/12 0612 11/22/12 0605  NA 135 137  K 3.4* 3.2*  CL 102 103  CO2 27 26  BUN 8 9  CREATININE 0.55 0.52  GLUCOSE 147* 134*  CALCIUM 8.4 8.1*   No results found for this basename: LABPT, INR,  in the last 72 hours  Neurologically intact Neurovascular intact Sensation intact distally Intact pulses distally Dorsiflexion/Plantar flexion intact Incision: scant drainage No cellulitis present  She is more alert today and has less pain.  Hemovac and foley removed.  Wound OK.  HGB 11 after two units transfusion.  Assessment/Plan: 2 Days Post-Op Procedure(s) (LRB): OPEN REDUCTION INTERNAL FIXATION RIGHT HIP (Right) Up with therapy  Tagen Milby 11/22/2012, 10:25 AM

## 2012-11-23 ENCOUNTER — Encounter (HOSPITAL_COMMUNITY): Payer: Self-pay | Admitting: Orthopaedic Surgery

## 2012-11-23 LAB — DIFFERENTIAL
Basophils Absolute: 0 10*3/uL (ref 0.0–0.1)
Eosinophils Relative: 3 % (ref 0–5)
Lymphocytes Relative: 15 % (ref 12–46)
Lymphs Abs: 1.2 10*3/uL (ref 0.7–4.0)
Monocytes Absolute: 1.1 10*3/uL — ABNORMAL HIGH (ref 0.1–1.0)
Monocytes Relative: 13 % — ABNORMAL HIGH (ref 3–12)
Neutro Abs: 5.6 10*3/uL (ref 1.7–7.7)

## 2012-11-23 LAB — CBC
HCT: 25.4 % — ABNORMAL LOW (ref 36.0–46.0)
Hemoglobin: 8.6 g/dL — ABNORMAL LOW (ref 12.0–15.0)
MCV: 90.4 fL (ref 78.0–100.0)
RBC: 2.81 MIL/uL — ABNORMAL LOW (ref 3.87–5.11)
RDW: 14.7 % (ref 11.5–15.5)
WBC: 8.1 10*3/uL (ref 4.0–10.5)

## 2012-11-23 LAB — BASIC METABOLIC PANEL
BUN: 12 mg/dL (ref 6–23)
Chloride: 107 mEq/L (ref 96–112)
Creatinine, Ser: 0.54 mg/dL (ref 0.50–1.10)
GFR calc Af Amer: >90 mL/min (ref >90)
GFR calc non Af Amer: 86 mL/min — ABNORMAL LOW (ref >90)
Potassium: 3.7 mEq/L (ref 3.5–5.1)
Sodium: 138 mEq/L (ref 135–145)

## 2012-11-23 LAB — TYPE AND SCREEN
ABO/RH(D): AB POS
Unit division: 0

## 2012-11-23 NOTE — Progress Notes (Signed)
Subjective: 3 Days Post-Op Procedure(s) (LRB): OPEN REDUCTION INTERNAL FIXATION RIGHT HIP (Right) Patient reports pain as 4 on 0-10 scale.    Objective: Vital signs in last 24 hours: Temp:  [97.1 F (36.2 C)-100.5 F (38.1 C)] 99 F (37.2 C) (10/20 0555) Pulse Rate:  [82-96] 82 (10/20 0418) Resp:  [16-18] 16 (10/20 0418) BP: (100-148)/(56-68) 147/68 mmHg (10/20 0418) SpO2:  [92 %-98 %] 97 % (10/20 0418)  Intake/Output from previous day: 10/19 0701 - 10/20 0700 In: 2100 [P.O.:250; I.V.:1800; IV Piggyback:50] Out: -  Intake/Output this shift:     Recent Labs  11/21/12 0612 11/22/12 0605  HGB 8.8* 11.0*    Recent Labs  11/21/12 0612 11/22/12 0605  WBC 9.4 10.5  RBC 2.83* 3.59*  HCT 26.5* 32.3*  PLT 127* 115*    Recent Labs  11/22/12 0605 11/23/12 0457  NA 137 138  K 3.2* 3.7  CL 103 107  CO2 26 25  BUN 9 12  CREATININE 0.52 0.54  GLUCOSE 134* 142*  CALCIUM 8.1* 8.0*   No results found for this basename: LABPT, INR,  in the last 72 hours  Neurologically intact Neurovascular intact Sensation intact distally Intact pulses distally Dorsiflexion/Plantar flexion intact Incision: scant drainage  She complains of compression device on calves.  I will change to foot pumps.  She has run low grade fever.  She has been in bed since surgery.  To get out of bed today and physical therapy.   Assessment/Plan: 3 Days Post-Op Procedure(s) (LRB): OPEN REDUCTION INTERNAL FIXATION RIGHT HIP (Right) Up with therapy  [name] 11/23/2012, 7:16 AM

## 2012-11-23 NOTE — Progress Notes (Signed)
Physical Therapy Treatment Patient Details Name: [name] MRN: [id number] DOB: 1929/01/07 Today's Date: 11/23/2012 Time: 7829-5621 PT Time Calculation (min): 35 min  PT Assessment / Plan / Recommendation     PT Comments   Pt appears to be more alert. Pt is able to follow directions for therex well with multimodal cueing for technique. Pt requires mod assist to come from supine to sitting at edge of bed. Sitting balance appears to be normal. Once standing pt tends to lean posteriorly and requires mod-max assist to avoid LOB. Pt left in chair with family member who plans to stay with pt for the remainder of the day.     Plan Current plan remains appropriate    Precautions / Restrictions Restrictions RLE Weight Bearing: Partial weight bearing (with PT per report)   Pertinent Vitals/Pain 4/10 right hip    Mobility  Bed Mobility Bed Mobility: Supine to Sit Supine to Sit: 3: Mod assist Transfers Transfers: Sit to Stand;Stand to Sit Sit to Stand: 3: Mod assist;From bed;With upper extremity assist Stand to Sit: 3: Mod assist;With armrests;To chair/3-in-1;Without upper extremity assist Ambulation/Gait Ambulation/Gait Assistance: 2: Max assist Assistive device: Rolling walker Ambulation/Gait Assistance Details: Pt was able to take 3 very small steps from bed to chair requireing max assist to avoid LOB. Gait Pattern: Trunk flexed;Decreased step length - left;Decreased step length - right;Decreased stance time - left    Exercises General Exercises - Lower Extremity Ankle Circles/Pumps: Both;10 reps;AROM Quad Sets: AROM;5 reps;Right Gluteal Sets: AROM;Both;5 reps Heel Slides: AAROM;5 reps;Right     Visit Information  Last PT Received On: 11/23/12    Subjective Data  Pt states that she if is feeling better today.  End of Session PT - End of Session Equipment Utilized During Treatment: Gait belt Activity Tolerance: Patient limited by fatigue Patient left: in chair;with call  bell/phone within reach;with family/visitor present   Seth Bake, PTA  11/23/2012, 9:53 AM

## 2012-11-23 NOTE — Progress Notes (Addendum)
Physical Therapy Treatment Patient Details Name: [name] MRN: [id number] DOB: 1928-05-04 Today's Date: 11/23/2012 Time: 9562-1308 PT Time Calculation (min): 30 min  PT Assessment / Plan / Recommendation  Assessment:          Pt instructed safe mechanics with sit to stand, stand to sit and transfer techniques with cueing for hand placement with RW to assist and safety with min-mod assistance required.  Session focus on improving functional independence and finding balance with PWB and HHA to RW.  Pt able to static stand x 2 minutes with min assistance and hand placement on RW.  Pt left in bed with call bell within reach and son in room.  Ice applied to hip for pain control with weight bearing.   Mobility  Transfers Transfers: Sit to Stand;Stand to Sit;Stand Pivot Transfers Sit to Stand: 3: Mod assist;With upper extremity assist;From chair/3-in-1 Stand to Sit: 4: Min assist;With upper extremity assist;To bed Stand Pivot Transfers: 3: Mod assist    Exercises Other Exercises Other Exercises: static standing balance training x 2 minutes PWB Rt with HHA on RW, cueing for posture   PT Diagnosis:    PT Problem List:   PT Treatment Interventions:     PT Goals (current goals can now be found in the care plan section)    Visit Information  Last PT Received On: 11/23/12    Subjective Data   Pt reported great lunch, Rt LE feeling good sitting up but is ready to go back to bed.  Reported pain scale 6-7/10    Cognition  Cognition Arousal/Alertness: Awake/alert Behavior During Therapy: WFL for tasks assessed/performed Overall Cognitive Status: Within Functional Limits for tasks assessed    Balance     End of Session PT - End of Session Equipment Utilized During Treatment: Gait belt Activity Tolerance: Patient limited by fatigue Patient left: in bed;with call bell/phone within reach;with nursing/sitter in room;with family/visitor present Nurse Communication: Mobility status    GP     Juel Burrow 11/23/2012, 2:35 PM

## 2012-11-23 NOTE — Clinical Social Work Note (Signed)
CSW faxed updated PT notes to [facility] and spoke with case manager there who reports she is reviewing referral. Probably d/c tomorrow. Updated [facility] who continue to have availability for pt.   Derenda Fennel, [place] [phone]

## 2012-11-23 NOTE — Progress Notes (Signed)
NAMEIREM, STONEHAM                 ACCOUNT NO.:  [id number]  MEDICAL RECORD NO.:  [id number]  LOCATION:                                 FACILITY:  PHYSICIAN:  Kingsley Callander. [name], MD       DATE OF BIRTH:  08-23-1928  DATE OF PROCEDURE:  11/23/2012 DATE OF DISCHARGE:                                PROGRESS NOTE   Mrs. Franze was admitted on November 19, 2012, after falling at home and fracturing her right hip.  She has undergone surgery 2 days ago.  Her drain was removed yesterday.  Her hemoglobin is trended down from 11 to 8.6.  She has not experienced hypotension.  She is afebrile this morning with a temperature of 99 pulse 82, and blood pressure of 147/68.  She is alert and appears comfortable.  She has been treated for urinary tract infection with Rocephin.  She was symptomatic on admission.  Urine culture reveals no growth.  She had recently been treated as an outpatient for UTI also.  PHYSICAL EXAMINATION:  LUNGS:  Clear. HEART:  Regular with no murmur. ABDOMEN:  Soft and nontender. EXTREMITIES:  She has normal pedal pulses with no edema.  Her dressing is dry and intact on the right hip.  IMPRESSION AND PLAN: 1. Right hip fracture, doing well postoperatively.  She will continue     with physical therapy. 2. History of osteoporosis. 3. Hypokalemia.  Serum potassium is normalized from 3.2 to 3.7 with     supplementation. 4. Acute blood loss anemia.  Hemoglobin is 8.6 with an MCV of 90.4. 5. Recent urinary tract infection. 6. Thrombocytopenia, platelet count has dropped from 203,000 to     116,000, she is on DVT prophylaxis with Lovenox.  We will follow. 7. Hypertension.  Continue losartan and low-dose metoprolol. 8. Anxiety.  Continue clorazepate 3.75 mg twice a day.     Kingsley Callander. [name], MD     ROF/MEDQ  D:  11/23/2012  T:  11/23/2012  Job:  [id number]

## 2012-11-24 LAB — CBC WITH DIFFERENTIAL/PLATELET
Basophils Relative: 0 % (ref 0–1)
Eosinophils Absolute: 0.3 10*3/uL (ref 0.0–0.7)
HCT: 27.2 % — ABNORMAL LOW (ref 36.0–46.0)
Hemoglobin: 9 g/dL — ABNORMAL LOW (ref 12.0–15.0)
Lymphs Abs: 1 10*3/uL (ref 0.7–4.0)
MCH: 30.3 pg (ref 26.0–34.0)
MCHC: 33.1 g/dL (ref 30.0–36.0)
MCV: 91.6 fL (ref 78.0–100.0)
Monocytes Absolute: 0.9 10*3/uL (ref 0.1–1.0)
Monocytes Relative: 10 % (ref 3–12)
Neutrophils Relative %: 76 % (ref 43–77)
Platelets: 153 10*3/uL (ref 150–400)
RBC: 2.97 MIL/uL — ABNORMAL LOW (ref 3.87–5.11)

## 2012-11-24 MED ORDER — ENOXAPARIN SODIUM 30 MG/0.3ML ~~LOC~~ SOLN
30.0000 mg | SUBCUTANEOUS | Status: DC
  Administered 2012-11-24 – 2012-11-25 (×2): 30 mg via SUBCUTANEOUS
  Filled 2012-11-24 (×2): qty 0.3

## 2012-11-24 NOTE — Progress Notes (Signed)
NAMEAUNE, ADAMI                 ACCOUNT NO.:  [id number]  MEDICAL RECORD NO.:  [id number]  LOCATION:  A307                          FACILITY:  [facility]  PHYSICIAN:  Kingsley Callander. [name], MD       DATE OF BIRTH:  [birth year]  DATE OF PROCEDURE:  11/24/2012 DATE OF DISCHARGE:                                PROGRESS NOTE   Mrs. Younts states that she is feeling better today.  She was able to work with the therapist yesterday.  She was able to stand with assistance.  She is reasonably pleased with the level of pain control at present.  PHYSICAL EXAMINATION:  GENERAL:  She has a pale appearance. VITAL SIGNS:  Temperature is 98.2, pulse 79, blood pressure 137/47, oxygen saturation 98%. LUNGS:  Clear. HEART:  Regular with no murmurs. ABDOMEN:  Soft and nontender.  Her right hip dressing is intact. EXTREMITIES:  Reveal no edema.  IMPRESSION/PLAN: 1. Status post right hip fracture repair.  Discussed with Dr. Hilda Lias.     A transfusion of 2 units of packed red cells was ordered.  Her     hemoglobin was 8.6 yesterday, her CBC today is pending. 2. Hypokalemia.  Potassium was up to 3.7 yesterday recheck tomorrow. 3. Hypertension, controlled. 4. Thrombocytopenia recheck today. 5. Anxiety stable control.     Kingsley Callander. [name], MD     ROF/MEDQ  D:  11/24/2012  T:  11/24/2012  Job:  [id number]

## 2012-11-24 NOTE — Clinical Social Work Note (Signed)
Pt to receive blood today. Received call from [facility] indicating authorization approved for SNF. [facility] confirms this message. Pt notified of approval.   Derenda Fennel, LCSW [phone]

## 2012-11-24 NOTE — Progress Notes (Signed)
Subjective: 4 Days Post-Op Procedure(s) (LRB): OPEN REDUCTION INTERNAL FIXATION RIGHT HIP (Right) Patient reports pain as 3 on 0-10 scale.    Objective: Vital signs in last 24 hours: Temp:  [98.2 F (36.8 C)-98.7 F (37.1 C)] 98.2 F (36.8 C) (10/21 0602) Pulse Rate:  [79-96] 79 (10/21 0602) Resp:  [18-20] 18 (10/21 0602) BP: (137-146)/(47-54) 137/47 mmHg (10/21 0602) SpO2:  [98 %-100 %] 98 % (10/21 0602)  Intake/Output from previous day: 10/20 0701 - 10/21 0700 In: 2082.7 [P.O.:1020; I.V.:1012.7; IV Piggyback:50] Out: 1700 [Urine:1700] Intake/Output this shift:     Recent Labs  11/22/12 0605 11/23/12 0658  HGB 11.0* 8.6*    Recent Labs  11/22/12 0605 11/23/12 0658  WBC 10.5 8.1  RBC 3.59* 2.81*  HCT 32.3* 25.4*  PLT 115* 116*    Recent Labs  11/22/12 0605 11/23/12 0457  NA 137 138  K 3.2* 3.7  CL 103 107  CO2 26 25  BUN 9 12  CREATININE 0.52 0.54  GLUCOSE 134* 142*  CALCIUM 8.1* 8.0*   No results found for this basename: LABPT, INR,  in the last 72 hours  Neurologically intact ABD soft Sensation intact distally Intact pulses distally Dorsiflexion/Plantar flexion intact Incision: scant drainage No cellulitis present  HBG has decreased again. To transfuse two units.  To decrease enoxaparin dosage.    She did good in PT yesterday.  To continue.  Assessment/Plan: 4 Days Post-Op Procedure(s) (LRB): OPEN REDUCTION INTERNAL FIXATION RIGHT HIP (Right) Up with therapy  [name] 11/24/2012, 7:30 AM

## 2012-11-24 NOTE — Progress Notes (Signed)
Physical Therapy Treatment Patient Details Name: [name] MRN: [id number] DOB: 1928/11/04 Today's Date: 11/24/2012 Time: 1100-1159 PT Time Calculation (min): 59 min 1 Therex 2 TA  PT Assessment / Plan / Recommendation        Patient completed bed exercises requiring assistance with RLE. Gait attempted with RW;Max A, however pt only able to complete one small step due to UEs not being able to offload weight of PWB/RLE and overall weakness of LEs. Standing pivot transfers completed with Mod/Max +1 for Bed<>BSC<>Recliner. Patient was able to complete sit<>stand several times (resting between) with step by step verbal and tactile cueing for proper use of RW, hand placement and PWB precaution.                                     Progress towards PT Goals Progress towards PT goals: Progressing toward goals  Plan      Precautions / Restrictions     Pertinent Vitals/Pain     Mobility  Bed Mobility Bed Mobility: Supine to Sit Supine to Sit: 3: Mod assist Details for Bed Mobility Assistance: assistance required with LEs and uprighting trunk Transfers Transfers: Sit to Stand;Stand to Sit;Stand Pivot Transfers Sit to Stand: 3: Mod assist Stand to Sit: 3: Mod assist Stand Pivot Transfers: 3: Mod assist Details for Transfer Assistance: Patient very slow to move once standing; only able to pivot LLE very tiny amounts at a time Ambulation/Gait Assistive device: Rolling walker Ambulation/Gait Assistance Details: Pt only able to take one small step today: UEs not able to support PWB of RLE Stairs: No Wheelchair Mobility Wheelchair Mobility: No    Exercises General Exercises - Lower Extremity Ankle Circles/Pumps: AROM;Both;10 reps;Supine Quad Sets: AROM;Both;10 reps;Supine Short Arc Quad: AROM;Both;10 reps;Supine;AAROM (AAROM RLE) Heel Slides: AROM;AAROM;Both;10 reps;Supine (AAROM RLE) Hip ABduction/ADduction: AROM;AAROM;Both;10 reps;Supine (AAROM RLE) Other Exercises Other  Exercises: STS x3 with seated rests between;static standing x 1 min for PWB technique   PT Diagnosis:    PT Problem List:   PT Treatment Interventions:     PT Goals (current goals can now be found in the care plan section)    Visit Information  Last PT Received On: 11/24/12 Assistance Needed: +1                        GP     [name] 11/24/2012, 12:19 PM

## 2012-11-25 ENCOUNTER — Inpatient Hospital Stay
Admission: RE | Admit: 2012-11-25 | Discharge: 2012-12-21 | Disposition: A | Discharge status: Nursing Home (Includes ICF) | Payer: Medicare Other | Source: Ambulatory Visit | Attending: Internal Medicine | Admitting: Internal Medicine

## 2012-11-25 DIAGNOSIS — T148XXA Other injury of unspecified body region, initial encounter: Principal | ICD-10-CM

## 2012-11-25 LAB — CBC WITH DIFFERENTIAL/PLATELET
Basophils Absolute: 0 10*3/uL (ref 0.0–0.1)
Basophils Relative: 0 % (ref 0–1)
Eosinophils Absolute: 0.2 10*3/uL (ref 0.0–0.7)
Eosinophils Relative: 2 % (ref 0–5)
Hemoglobin: 11 g/dL — ABNORMAL LOW (ref 12.0–15.0)
Lymphs Abs: 1.2 10*3/uL (ref 0.7–4.0)
MCH: 29.7 pg (ref 26.0–34.0)
MCHC: 33.7 g/dL (ref 30.0–36.0)
MCV: 88.1 fL (ref 78.0–100.0)
Monocytes Relative: 13 % — ABNORMAL HIGH (ref 3–12)
Platelets: 177 10*3/uL (ref 150–400)
RBC: 3.7 MIL/uL — ABNORMAL LOW (ref 3.87–5.11)
RDW: 15.4 % (ref 11.5–15.5)
WBC: 7.9 10*3/uL (ref 4.0–10.5)

## 2012-11-25 LAB — BASIC METABOLIC PANEL
BUN: 10 mg/dL (ref 6–23)
Calcium: 9 mg/dL (ref 8.4–10.5)
Chloride: 104 mEq/L (ref 96–112)
GFR calc Af Amer: >90 mL/min (ref >90)
GFR calc non Af Amer: 88 mL/min — ABNORMAL LOW (ref >90)
Glucose, Bld: 130 mg/dL — ABNORMAL HIGH (ref 70–99)
Potassium: 4.1 mEq/L (ref 3.5–5.1)

## 2012-11-25 LAB — TYPE AND SCREEN
Unit division: 0
Unit division: 0

## 2012-11-25 LAB — PREPARE RBC (CROSSMATCH)

## 2012-11-25 MED ORDER — MAGNESIUM HYDROXIDE 400 MG/5ML PO SUSP: 30.0000 mL | Freq: Every day | ORAL | Status: DC | PRN

## 2012-11-25 MED ORDER — ENOXAPARIN SODIUM 30 MG/0.3ML ~~LOC~~ SOLN: 30.0000 mg | SUBCUTANEOUS | Status: DC

## 2012-11-25 MED ORDER — TRAMADOL HCL 50 MG PO TABS: 50.0000 mg | ORAL_TABLET | Freq: Four times a day (QID) | ORAL | Status: DC | PRN

## 2012-11-25 NOTE — Progress Notes (Signed)
UR chart review completed.  

## 2012-11-25 NOTE — Clinical Social Work Note (Signed)
Pt d/c today to [facility]. Pt, daughter, and facility aware and agreeable. Pt to transfer with RN. D/C summary faxed.  Derenda Fennel, [place] [phone]

## 2012-11-25 NOTE — Discharge Summary (Signed)
NAMEMAUDEAN, [name]                 ACCOUNT NO.:  [id number]  MEDICAL RECORD NO.:  [id number]  LOCATION:  A307                          FACILITY:  [facility]  PHYSICIAN:  Kingsley Callander. [name], MD       DATE OF BIRTH:  17-Sep-1928  DATE OF ADMISSION:  11/19/2012 DATE OF DISCHARGE:  LH                         DISCHARGE SUMMARY-REFERRING   DISCHARGE DIAGNOSES: 1. Right hip fracture. 2. Acute blood loss anemia. 3. Hypertension. 4. Orthostatic hypotension. 5. Osteoporosis. 6. Osteoarthritis. 7. Hypokalemia. 8. Thrombocytopenia. 9. Anxiety.  DISCHARGE MEDICATIONS:  Lovenox 30 mg daily for 1 month, acetaminophen 500 mg q.6 h. p.r.n., Tranxene 3.75 mg b.i.d., vitamin D 1000 units b.i.d., losartan 50 mg daily, milk of magnesia 30 mL daily p.r.n., tramadol 50 mg q.6 h. p.r.n.  HOSPITAL COURSE:  This patient is an 79 year old female with osteoporosis who fell at home and fractured her right hip.  She underwent repair of her fracture on the 17th.  Postoperatively, she experienced an acute blood loss anemia, requiring transfusions of 2 units of packed red cells on 2 occasions.  Her hemoglobin at discharge is 11.  Her hip wound is healing well at this point.  She is working with physical therapy.  She will require additional rehab at the [facility].  The recommendation is to allow toe touch at this point.  She will need follow up with Dr. Hilda Lias in 4 weeks.  The staples can be removed on October 27.  She will need to continue Lovenox for 1 month for DVT prophylaxis. Please obtain a CBC in 1 week.  She has a history of hypertension and orthostatic hypotension.  Blood pressures have been in the high normal to mildly elevated range at the time during her hospitalization.  She will be continued on losartan at this point.  She was treated perioperatively with metoprolol which can be discontinued at this point.  For pain will be treated with tramadol for now.  She has an intolerance to  hydrocodone.  She experienced hypokalemia during her hospitalization and was supplemented to the normal range with oral potassium.  She has a longstanding history of anxiety.  Symptoms have been stable on clorazepate.  She has had a mild thrombocytopenia which has normalized. She was treated recently for urinary tract infection just prior to hospitalization.  She had dysuria on admission and was treated with Rocephin.  Her urine culture was negative.  Rocephin was discontinued after 5 days.  She has a history of osteoporosis as noted.  She has had intolerance to bisphosphonates.  She would be a good candidate for Reclast potentially in the future.  The patient will be seen in followup at the [facility].  CONDITION AT DISCHARGE:  Much improved.     Kingsley Callander. [name], MD     ROF/MEDQ  D:  11/25/2012  T:  11/25/2012  Job:  [id number]

## 2012-11-25 NOTE — Clinical Social Work Placement (Signed)
Clinical Social Work Department CLINICAL SOCIAL WORK PLACEMENT NOTE 11/25/2012  Patient:  LASHEA, GODA  Account Number:  [id number] Admit date:  11/19/2012  Clinical Social Worker:  Derenda Fennel, LCSW  Date/time:  11/20/2012 02:20 PM  Clinical Social Work is seeking post-discharge placement for this patient at the following level of care:   SKILLED NURSING   (*CSW will update this form in Epic as items are completed)   11/20/2012  Patient/family provided with [facility] Department of Clinical Social Work's list of facilities offering this level of care within the geographic area requested by the patient (or if unable, by the patient's family).  11/20/2012  Patient/family informed of their freedom to choose among providers that offer the needed level of care, that participate in Medicare, Medicaid or managed care program needed by the patient, have an available bed and are willing to accept the patient.  11/20/2012  Patient/family informed of [facility]' ownership interest in [facility], as well as of the fact that they are under no obligation to receive care at this facility.  PASARR submitted to EDS on  PASARR number received from EDS on   FL2 transmitted to all facilities in geographic area requested by pt/family on  11/20/2012 FL2 transmitted to all facilities within larger geographic area on   Patient informed that his/her managed care company has contracts with or will negotiate with  certain facilities, including the following:     Patient/family informed of bed offers received:  11/20/2012 Patient chooses bed at [facility] Physician recommends and patient chooses bed at  [facility]  Patient to be transferred to [facility] on  11/25/2012 Patient to be transferred to facility by RN  The following physician request were entered in Epic:   Additional Comments: Pt has existing pasarr.  Derenda Fennel, [place] [phone]

## 2012-11-25 NOTE — Progress Notes (Signed)
Went to pick up blood in lab. Type and screen expired at midnight. Reordered type and screen with cross match.

## 2012-11-25 NOTE — Discharge Planning (Signed)
Pt stated she was ready to go and pain was under control.  Pt's IV was removed and report was called to [facility].  Pt will be pushed to [facility] by PCT and family shortly.

## 2012-11-25 NOTE — Progress Notes (Signed)
Subjective: 5 Days Post-Op Procedure(s) (LRB): OPEN REDUCTION INTERNAL FIXATION RIGHT HIP (Right) Patient reports pain as 3 on 0-10 scale.    Objective: Vital signs in last 24 hours: Temp:  [97.9 F (36.6 C)-99.1 F (37.3 C)] 98.5 F (36.9 C) (10/22 0515) Pulse Rate:  [73-95] 82 (10/22 0515) Resp:  [16-20] 18 (10/22 0515) BP: (118-162)/(33-74) 148/70 mmHg (10/22 0515) SpO2:  [92 %-100 %] 95 % (10/22 0515)  Intake/Output from previous day: 10/21 0701 - 10/22 0700 In: 1076.2 [P.O.:340; I.V.:436.2; Blood:300] Out: 2000 [Urine:2000] Intake/Output this shift:     Recent Labs  11/23/12 0658 11/24/12 0914 11/25/12 0540  HGB 8.6* 9.0* 11.0*    Recent Labs  11/24/12 0914 11/25/12 0540  WBC 8.9 7.9  RBC 2.97* 3.70*  HCT 27.2* 32.6*  PLT 153 177    Recent Labs  11/23/12 0457 11/25/12 0540  NA 138 140  K 3.7 4.1  CL 107 104  CO2 25 28  BUN 12 10  CREATININE 0.54 0.51  GLUCOSE 142* 130*  CALCIUM 8.0* 9.0   No results found for this basename: LABPT, INR,  in the last 72 hours  Neurologically intact Sensation intact distally Intact pulses distally Dorsiflexion/Plantar flexion intact Incision: scant drainage No cellulitis present  She did well in PT.  Wound looks good.  To be discharged to SNF today.  I will see in my office in one month.  X-rays to be done prior to that.  She will have toe touch weight bearing.  She is to have staples removed 10/27.    Assessment/Plan: 5 Days Post-Op Procedure(s) (LRB): OPEN REDUCTION INTERNAL FIXATION RIGHT HIP (Right) Discharge to SNF  [facility] 11/25/2012, 7:45 AM

## 2012-11-26 LAB — TYPE AND SCREEN: Unit division: 0

## 2012-12-18 ENCOUNTER — Ambulatory Visit (HOSPITAL_COMMUNITY): Payer: Medicare Other | Attending: Internal Medicine

## 2012-12-18 DIAGNOSIS — S7223XA Displaced subtrochanteric fracture of unspecified femur, initial encounter for closed fracture: Secondary | ICD-10-CM | POA: Insufficient documentation

## 2012-12-18 DIAGNOSIS — X58XXXA Exposure to other specified factors, initial encounter: Secondary | ICD-10-CM | POA: Insufficient documentation

## 2012-12-21 ENCOUNTER — Encounter (HOSPITAL_COMMUNITY): Payer: Self-pay | Admitting: Emergency Medicine

## 2012-12-21 ENCOUNTER — Emergency Department (HOSPITAL_COMMUNITY): Payer: Medicare Other

## 2012-12-21 ENCOUNTER — Emergency Department (HOSPITAL_COMMUNITY)
Admission: EM | Admit: 2012-12-21 | Discharge: 2012-12-21 | Disposition: A | Discharge status: Home / Self Care | Payer: Medicare Other | Attending: Emergency Medicine | Admitting: Emergency Medicine

## 2012-12-21 DIAGNOSIS — W010XXA Fall on same level from slipping, tripping and stumbling without subsequent striking against object, initial encounter: Secondary | ICD-10-CM | POA: Insufficient documentation

## 2012-12-21 DIAGNOSIS — Y9210 Unspecified residential institution as the place of occurrence of the external cause: Secondary | ICD-10-CM | POA: Insufficient documentation

## 2012-12-21 DIAGNOSIS — S0990XA Unspecified injury of head, initial encounter: Secondary | ICD-10-CM | POA: Insufficient documentation

## 2012-12-21 DIAGNOSIS — M1990 Unspecified osteoarthritis, unspecified site: Secondary | ICD-10-CM | POA: Insufficient documentation

## 2012-12-21 DIAGNOSIS — IMO0002 Reserved for concepts with insufficient information to code with codable children: Secondary | ICD-10-CM | POA: Insufficient documentation

## 2012-12-21 DIAGNOSIS — W19XXXA Unspecified fall, initial encounter: Secondary | ICD-10-CM

## 2012-12-21 DIAGNOSIS — Z79899 Other long term (current) drug therapy: Secondary | ICD-10-CM | POA: Insufficient documentation

## 2012-12-21 DIAGNOSIS — I10 Essential (primary) hypertension: Secondary | ICD-10-CM | POA: Insufficient documentation

## 2012-12-21 DIAGNOSIS — Z7901 Long term (current) use of anticoagulants: Secondary | ICD-10-CM | POA: Insufficient documentation

## 2012-12-21 DIAGNOSIS — Z9889 Other specified postprocedural states: Secondary | ICD-10-CM | POA: Insufficient documentation

## 2012-12-21 DIAGNOSIS — Y9301 Activity, walking, marching and hiking: Secondary | ICD-10-CM | POA: Insufficient documentation

## 2012-12-21 DIAGNOSIS — Z8781 Personal history of (healed) traumatic fracture: Secondary | ICD-10-CM | POA: Insufficient documentation

## 2012-12-21 NOTE — ED Notes (Signed)
Pt is at [facility] for rehab after right hip fx repair in September.  Tonight, pt was coming out of the bathroom and slipped on her socks, fell to the ground landing on her back side.  Pt states she hit the back of her head and also hurt her back. Pt denies loc.

## 2012-12-21 NOTE — ED Provider Notes (Signed)
CSN: [id number]     Arrival date & time 12/21/12  0043 History   First MD Initiated Contact with Patient 12/21/12 0100     Chief Complaint  Patient presents with  . Fall   (Consider location/radiation/quality/duration/timing/severity/associated sxs/prior Treatment) HPI Comments: 79 year old female with a recent fracture of the right hip requiring an open reduction and internal fixation. She has been in a skilled nursing facility with rehabilitation over the last 6 weeks during which time she has felt some improvement and is able to ambulate. She has been up and down to the bathroom because of urinary frequency (nursing facility obtain urine sample tonight to test for urine infection) and this evening while ambulating she lost her balance when her foot slipped on the floor causing her to fall. She believes that she fell forward, she has mild pain to the top of her head where she states that her head hit the floor though there is no bruising bleeding or signs of trauma to the head. She is unsure if she cut her self with her hands or her knees or fell onto her buttock but believes that she fell forward. She was unable to get up by herself, called for help, and has minimal complaints of pain at this time. This occurred just prior to arrival. She denies any prodromal symptoms including palpitations, chest pain, shortness of breath, nausea, vomiting, dizziness, blurred vision.  Patient is a 79 y.o. female presenting with fall. The history is provided by the patient and the EMS personnel.  Fall    Past Medical History  Diagnosis Date  . Hypertension   . Osteoarthritis    Past Surgical History  Procedure Laterality Date  . Kidney stone surgery    . Stress fractures of legs    . Appendectomy    . Tubal ligation    . Orif hip fracture Right 11/20/2012    Procedure: OPEN REDUCTION INTERNAL FIXATION RIGHT HIP;  Surgeon: Darreld Mclean, MD;  Location: [facility];  Service: Orthopedics;  Laterality: Right;    No family history on file. History  Substance Use Topics  . Smoking status: Never Smoker   . Smokeless tobacco: Not on file  . Alcohol Use: No   OB History   Grav Para Term Preterm Abortions TAB SAB Ect Mult Living                 Review of Systems  All other systems reviewed and are negative.    Allergies  Hydrocodone-acetaminophen  Home Medications   Current Outpatient Rx  Name  Route  Sig  Dispense  Refill  . acetaminophen (TYLENOL) 500 MG tablet   Oral   Take 500 mg by mouth every 6 (six) hours as needed. For pain         . cholecalciferol (VITAMIN D) 1000 UNITS tablet   Oral   Take 1,000 Units by mouth 2 (two) times daily.          . clorazepate (TRANXENE) 7.5 MG tablet   Oral   Take 3.75 mg by mouth 2 (two) times daily.         Marland Kitchen enoxaparin (LOVENOX) 30 MG/0.3ML injection   Subcutaneous   Inject 0.3 mLs (30 mg total) into the skin daily.   30 Syringe   0   . losartan (COZAAR) 50 MG tablet   Oral   Take 50 mg by mouth daily.         . magnesium hydroxide (MILK OF MAGNESIA) 400 MG/5ML  suspension   Oral   Take 30 mLs by mouth daily as needed.   360 mL   0   . traMADol (ULTRAM) 50 MG tablet   Oral   Take 1 tablet (50 mg total) by mouth every 6 (six) hours as needed.   30 tablet   1    BP 157/80  Pulse 96  Temp(Src) 98 F (36.7 C) (Oral)  Resp 22  Ht 5' (1.524 m)  Wt 122 lb (55.339 kg)  BMI 23.83 kg/m2  SpO2 96% Physical Exam  Nursing note and vitals reviewed. Constitutional: She appears well-developed and well-nourished. No distress.  HENT:  Head: Normocephalic and atraumatic.  Mouth/Throat: Oropharynx is clear and moist. No oropharyngeal exudate.  no facial tenderness, deformity, malocclusion or hemotympanum.  no battle's sign or racoon eyes.   Eyes: Conjunctivae and EOM are normal. Pupils are equal, round, and reactive to light. Right eye exhibits no discharge. Left eye exhibits no discharge. No scleral icterus.  Neck:  Normal range of motion. Neck supple. No JVD present. No thyromegaly present.  Cardiovascular: Normal rate, regular rhythm, normal heart sounds and intact distal pulses.  Exam reveals no gallop and no friction rub.   No murmur heard. Pulmonary/Chest: Effort normal and breath sounds normal. No respiratory distress. She has no wheezes. She has no rales.  Abdominal: Soft. Bowel sounds are normal. She exhibits no distension and no mass. There is no tenderness.  Musculoskeletal: Normal range of motion. She exhibits tenderness ( Mild tenderness with rotation of the right hip, she is able to straight leg raise without difficulty on the right). She exhibits no edema.  Bilateral upper extremities and left lower extremity without injury tenderness, all extremities with supple joints and soft compartments. No tenderness of the cervical thoracic or lumbar spines  Lymphadenopathy:    She has no cervical adenopathy.  Neurological: She is alert. Coordination normal.  Cranial nerves III through XII intact, normal strength and sensation of the bilateral lower and upper extremities  Skin: Skin is warm and dry. No rash noted. No erythema.  Numbers in, hematoma abrasions or lacerations to the extremities trunk or scalp  Psychiatric: She has a normal mood and affect. Her behavior is normal.    ED Course  Procedures (including critical care time) Labs Review Labs Reviewed - No data to display Imaging Review Dg Hip Complete Right  12/21/2012   CLINICAL DATA:  Right hip pain after fall. History of right hip fracture.  EXAM: RIGHT HIP - COMPLETE 2+ VIEW  COMPARISON:  Right hip radiograph November 14th 2014 and November 20, 2012 fluoroscopic views.  FINDINGS: Right femoral neck pinning with lateral plate and screw fixation transfixing nondisplaced intertrochanteric fracture without radiographic findings of interval healing. No periprosthetic lucency. No acute fracture deformity. No dislocation. No destructive bony  lesions, patient is osteopenic which may decrease sensitivity for acute nondisplaced fractures.  Phleboliths in the pelvis. Moderate to severe right, mild left hip osteoarthrosis. Included view of the more distal lateral femoral diaphyseal cortex demonstrates focal thickening and associated linear lucency which suggests remote insufficiency fracture as previously described.  IMPRESSION: Status post right hip ORIF for nondisplaced intertrochanteric fracture, no radiographic findings of hardware failure, nor interval healing. No acute fracture deformity nor dislocation.   Electronically Signed   By: Awilda Metro   On: 12/21/2012 02:26    EKG Interpretation   None       MDM   1. Fall, initial encounter    No obvious signs  of trauma, the patient has some right-sided hip pain but no signs of deformity or shortening or rotation. Image right hip, no other signs of trauma, patient clearly describes a mechanical fall, she does have urinary frequency which is being evaluated and treated at her nursing facility this evening, no indication for repeat testing this evening. She also declines pain medications at this time  Pt has no signs of complictions of surgery / new frx on xray - stable for d/c back to rehab.  Vida Roller, MD 12/21/12 [phone]

## 2012-12-22 NOTE — H&P (Signed)
NAMEBETTYLOU, [name]                 ACCOUNT NO.:  [id number]  MEDICAL RECORD NO.:  [id number]  LOCATION:  APA05                         FACILITY:  [facility]  PHYSICIAN:  Kingsley Callander. [name], MD       DATE OF BIRTH:  Oct 03, 1928  DATE OF ADMISSION:  12/21/2012 DATE OF DISCHARGE:  11/17/2014LH                             HISTORY & PHYSICAL   HISTORY OF PRESENT ILLNESS:  This patient is an 80 year old white female who was transferred [facility] after a right hip fracture repair.  The patient had fallen at home and fractured her right hip.  She underwent repair on November 20, 2012, by Dr. Hilda Lias.  She had a postoperative blood loss anemia, requiring 2-units transfusion of packed red cells twice.  Her hemoglobin was 11 at discharge.  Her hip wound has healed well.  She has improved gradually with physical therapy.  She is not requiring pain medication at this point.  She continues with toe touch only without weightbearing.  She has been on DVT prophylaxis with Lovenox.  PAST MEDICAL HISTORY: 1. Osteoporosis. 2. Vitamin D insufficiency. 3. Hypertension. 4. Goiter. 5. Anxiety. 6. GERD. 7. Kidney stones. 8. Right bundle-branch block. 9. History of stress fractures of the femurs. 10.Orthostatic hypotension. 11.Osteoarthritis. 12.Thrombocytopenia. 13.Appendectomy. 14.Tubal ligation. 15.Colonoscopy in November 2005, revealing diverticulosis.  MEDICATIONS:  Tranxene 3.75 mg b.i.d., vitamin D 1000 units b.i.d., losartan 50 mg daily, acetaminophen 500 mg every 6 hours as needed, milk of magnesia 30 mL daily as needed, tramadol 50 mg every 6 hours as needed.  ALLERGIES:  None.  SOCIAL HISTORY:  She does not smoke, drink, or use recreational substances.  FAMILY HISTORY:  Her mother had a stroke.  Her father had congestive heart failure.  Sister has had a pacemaker.  Sister has had dementia. Her brother has had a brain tumor.  REVIEW OF SYSTEMS:  No syncope, chest pain,  chronic cough, difficulty breathing, change in bowel habits, rectal bleeding, or gross hematuria.  PHYSICAL EXAMINATION:  HEENT:  Eyes, nose, and oropharynx are unremarkable. NECK:  Reveals a stable nontender goiter.  No JVD or carotid bruits. LUNGS:  Clear. HEART:  Regular with no murmurs. ABDOMEN:  Soft and nontender with no palpable organomegaly. EXTREMITIES:  The right hip wound has healed well.  No cyanosis, clubbing, or edema. NEUROLOGIC:  At baseline. LYMPH NODES:  No cervical or supraclavicular enlargement.  IMPRESSION/PLAN: 1. Right hip fracture.  Continue physical therapy.  She will have     followup with orthopedics. 2. Anemia.  Check followup CBC. 3. Hypertension.  Continue losartan. 4. Orthostatic hypotension. 5. Osteoporosis.  Previously intolerant of alendronate.  There had     been dental concerns as well. 6. Osteoarthritis. 7. Thrombocytopenia. 8. Anxiety.  Continue Tranxene. 9. History of kidney stones. 10.Right bundle-branch block. 11.Vitamin D insufficiency.  Continue vitamin D. 12.Goiter.  Stable. 13.History of stress fractures of the femurs.     Kingsley Callander. [name], MD     ROF/MEDQ  D:  12/22/2012  T:  12/22/2012  Job:  [id number]

## 2012-12-23 ENCOUNTER — Emergency Department (HOSPITAL_COMMUNITY)
Admission: EM | Admit: 2012-12-23 | Discharge: 2012-12-23 | Disposition: A | Discharge status: Home / Self Care | Payer: Medicare Other | Attending: Emergency Medicine | Admitting: Emergency Medicine

## 2012-12-23 ENCOUNTER — Inpatient Hospital Stay
Admission: RE | Admit: 2012-12-23 | Discharge: 2013-02-18 | Disposition: A | Discharge status: Home / Self Care | Payer: Medicare Other | Source: Ambulatory Visit | Attending: Internal Medicine | Admitting: Internal Medicine

## 2012-12-23 ENCOUNTER — Encounter (HOSPITAL_COMMUNITY): Payer: Self-pay | Admitting: Emergency Medicine

## 2012-12-23 DIAGNOSIS — Z7901 Long term (current) use of anticoagulants: Secondary | ICD-10-CM | POA: Insufficient documentation

## 2012-12-23 DIAGNOSIS — R531 Weakness: Secondary | ICD-10-CM

## 2012-12-23 DIAGNOSIS — R5381 Other malaise: Secondary | ICD-10-CM | POA: Insufficient documentation

## 2012-12-23 DIAGNOSIS — M25559 Pain in unspecified hip: Secondary | ICD-10-CM | POA: Insufficient documentation

## 2012-12-23 DIAGNOSIS — M1990 Unspecified osteoarthritis, unspecified site: Secondary | ICD-10-CM | POA: Insufficient documentation

## 2012-12-23 DIAGNOSIS — R630 Anorexia: Secondary | ICD-10-CM | POA: Insufficient documentation

## 2012-12-23 DIAGNOSIS — Z79899 Other long term (current) drug therapy: Secondary | ICD-10-CM | POA: Insufficient documentation

## 2012-12-23 DIAGNOSIS — Z9889 Other specified postprocedural states: Secondary | ICD-10-CM | POA: Insufficient documentation

## 2012-12-23 DIAGNOSIS — F0390 Unspecified dementia without behavioral disturbance: Secondary | ICD-10-CM | POA: Insufficient documentation

## 2012-12-23 DIAGNOSIS — I10 Essential (primary) hypertension: Secondary | ICD-10-CM | POA: Insufficient documentation

## 2012-12-23 LAB — CBC WITH DIFFERENTIAL/PLATELET
Basophils Absolute: 0 10*3/uL (ref 0.0–0.1)
Eosinophils Absolute: 0.2 10*3/uL (ref 0.0–0.7)
Eosinophils Relative: 2 % (ref 0–5)
HCT: 38.9 % (ref 36.0–46.0)
Lymphocytes Relative: 25 % (ref 12–46)
MCH: 29.6 pg (ref 26.0–34.0)
MCV: 92.8 fL (ref 78.0–100.0)
Monocytes Absolute: 0.8 10*3/uL (ref 0.1–1.0)
Monocytes Relative: 11 % (ref 3–12)
Platelets: 222 10*3/uL (ref 150–400)
RDW: 15 % (ref 11.5–15.5)
WBC: 7.8 10*3/uL (ref 4.0–10.5)

## 2012-12-23 LAB — URINALYSIS, ROUTINE W REFLEX MICROSCOPIC
Bilirubin Urine: NEGATIVE
Glucose, UA: NEGATIVE mg/dL
Ketones, ur: NEGATIVE mg/dL
Nitrite: NEGATIVE
Protein, ur: NEGATIVE mg/dL
pH: 6 (ref 5.0–8.0)

## 2012-12-23 LAB — BASIC METABOLIC PANEL
BUN: 13 mg/dL (ref 6–23)
CO2: 27 mEq/L (ref 19–32)
Calcium: 9.7 mg/dL (ref 8.4–10.5)
Chloride: 97 mEq/L (ref 96–112)
Creatinine, Ser: 0.67 mg/dL (ref 0.50–1.10)
Glucose, Bld: 118 mg/dL — ABNORMAL HIGH (ref 70–99)
Potassium: 3.8 mEq/L (ref 3.5–5.1)
Sodium: 133 mEq/L — ABNORMAL LOW (ref 135–145)

## 2012-12-23 LAB — URINE MICROSCOPIC-ADD ON

## 2012-12-23 NOTE — ED Notes (Deleted)
Pt's daughter is here and states pt was "very confused" over the weekend but she "seems to be more herself now".

## 2012-12-23 NOTE — ED Notes (Signed)
Pt's daughter is at bedside and states pt was "very confused" this past weekend but she "seems to be more herself today".

## 2012-12-23 NOTE — ED Provider Notes (Signed)
CSN: [id number]     Arrival date & time 12/23/12  1024 History   First MD Initiated Contact with Patient 12/23/12 1034   This chart was scribed for Donnetta Hutching, MD by Valera Castle, ED Scribe. This patient was seen in room APA02/APA02 and the patient's care was started at 11:04 AM.   Chief Complaint  Patient presents with  . Weakness    (Consider location/radiation/quality/duration/timing/severity/associated sxs/prior Treatment)  The history is provided by the patient and a relative. No language interpreter was used.   HPI Comments: [name] is a 79 y.o. female brought in by her daughter who presents to the Emergency Department complaining of  onset 5 days ago. Her daughter reports that over the weekend her mother became  confused and disoriented.   Daughter also states that yesterday and today patient has been fine, and has returned to her normal self.   She reports her mother being seen at this ED after falling while going to the bathroom when her leg gave out on her 4 days ago. She reports a h/o right hip surgery on 11/20/2012 at the [facility]. She states she has been doing therapy for her leg, and that the therapy has been going well, but that she gets so weak from the activies that her leg will give out on her. She reports weakness, right hip pain, decreased appetite, and fatigue. She denies chest pain, significant weight loss, and any other associated symptoms.    PCP Carylon Perches, MD  Past Medical History  Diagnosis Date  . Hypertension   . Osteoarthritis    Past Surgical History  Procedure Laterality Date  . Kidney stone surgery    . Stress fractures of legs    . Appendectomy    . Tubal ligation    . Orif hip fracture Right 11/20/2012    Procedure: OPEN REDUCTION INTERNAL FIXATION RIGHT HIP;  Surgeon: Darreld Mclean, MD;  Location: [facility];  Service: Orthopedics;  Laterality: Right;   History reviewed. No pertinent family history. History  Substance Use Topics  .  Smoking status: Never Smoker   . Smokeless tobacco: Not on file  . Alcohol Use: No   OB History   Grav Para Term Preterm Abortions TAB SAB Ect Mult Living                 Review of Systems A complete 10 system review of systems was obtained and all systems are negative except as noted in the HPI and PMH.   Allergies  Hydrocodone-acetaminophen  Home Medications   Current Outpatient Rx  Name  Route  Sig  Dispense  Refill  . acetaminophen (TYLENOL) 500 MG tablet   Oral   Take 500 mg by mouth every 6 (six) hours as needed. For pain         . cholecalciferol (VITAMIN D) 1000 UNITS tablet   Oral   Take 1,000 Units by mouth 2 (two) times daily.          . clorazepate (TRANXENE) 7.5 MG tablet   Oral   Take 3.75 mg by mouth 2 (two) times daily.         Marland Kitchen enoxaparin (LOVENOX) 30 MG/0.3ML injection   Subcutaneous   Inject 0.3 mLs (30 mg total) into the skin daily.   30 Syringe   0   . losartan (COZAAR) 50 MG tablet   Oral   Take 50 mg by mouth daily.         . magnesium  hydroxide (MILK OF MAGNESIA) 400 MG/5ML suspension   Oral   Take 30 mLs by mouth daily as needed for mild constipation or moderate constipation.         . traMADol (ULTRAM) 50 MG tablet   Oral   Take 100 mg by mouth every 6 (six) hours as needed for moderate pain or severe pain.          Triage Vitals: BP 132/70  Pulse 102  Temp(Src) 98.1 F (36.7 C) (Oral)  Resp 18  SpO2 97%  Physical Exam  Nursing note and vitals reviewed. Constitutional: She is oriented to person, place, and time. She appears well-developed and well-nourished.  Mild dementia at baseline.  HENT:  Head: Normocephalic and atraumatic.  Eyes: Conjunctivae and EOM are normal. Pupils are equal, round, and reactive to light.  Neck: Normal range of motion. Neck supple.  Cardiovascular: Normal rate, regular rhythm and normal heart sounds.   Pulmonary/Chest: Effort normal and breath sounds normal.  Abdominal: Soft. Bowel  sounds are normal.  nontender  Genitourinary:  Normal genitalia  Musculoskeletal: Normal range of motion. She exhibits tenderness.  Tenderness to palpation over right hip.  Neurological: She is alert and oriented to person, place, and time.  Skin: Skin is warm and dry.  Pink color  Psychiatric: She has a normal mood and affect. Her behavior is normal.    ED Course  Procedures (including critical care time)  DIAGNOSTIC STUDIES: Oxygen Saturation is 97% on room air, normal by my interpretation.    COORDINATION OF CARE: 11:21 AM-Discussed treatment plan which includes CBC panel, BMP, and UA with pt at bedside and pt agreed to plan.   Labs Review Labs Reviewed  URINALYSIS, ROUTINE W REFLEX MICROSCOPIC - Abnormal; Notable for the following:    Hgb urine dipstick SMALL (*)    All other components within normal limits  URINE MICROSCOPIC-ADD ON  BASIC METABOLIC PANEL  CBC WITH DIFFERENTIAL   Imaging Review No results found.  EKG Interpretation   None      Results for orders placed during the hospital encounter of 12/23/12  BASIC METABOLIC PANEL      Result Value Range   Sodium 133 (*) 135 - 145 mEq/L   Potassium 3.8  3.5 - 5.1 mEq/L   Chloride 97  96 - 112 mEq/L   CO2 27  19 - 32 mEq/L   Glucose, Bld 118 (*) 70 - 99 mg/dL   BUN 13  6 - 23 mg/dL   Creatinine, Ser 6.57  0.50 - 1.10 mg/dL   Calcium 9.7  8.4 - 84.6 mg/dL   GFR calc non Af Amer 80 (*) >90 mL/min   GFR calc Af Amer >90  >90 mL/min  URINALYSIS, ROUTINE W REFLEX MICROSCOPIC      Result Value Range   Color, Urine YELLOW  YELLOW   APPearance CLEAR  CLEAR   Specific Gravity, Urine 1.010  1.005 - 1.030   pH 6.0  5.0 - 8.0   Glucose, UA NEGATIVE  NEGATIVE mg/dL   Hgb urine dipstick SMALL (*) NEGATIVE   Bilirubin Urine NEGATIVE  NEGATIVE   Ketones, ur NEGATIVE  NEGATIVE mg/dL   Protein, ur NEGATIVE  NEGATIVE mg/dL   Urobilinogen, UA 0.2  0.0 - 1.0 mg/dL   Nitrite NEGATIVE  NEGATIVE   Leukocytes, UA NEGATIVE   NEGATIVE  URINE MICROSCOPIC-ADD ON      Result Value Range   Squamous Epithelial / LPF RARE  RARE   RBC /  HPF 3-6  <3 RBC/hpf   Dg Hip Complete Right  12/21/2012   CLINICAL DATA:  Right hip pain after fall. History of right hip fracture.  EXAM: RIGHT HIP - COMPLETE 2+ VIEW  COMPARISON:  Right hip radiograph November 14th 2014 and November 20, 2012 fluoroscopic views.  FINDINGS: Right femoral neck pinning with lateral plate and screw fixation transfixing nondisplaced intertrochanteric fracture without radiographic findings of interval healing. No periprosthetic lucency. No acute fracture deformity. No dislocation. No destructive bony lesions, patient is osteopenic which may decrease sensitivity for acute nondisplaced fractures.  Phleboliths in the pelvis. Moderate to severe right, mild left hip osteoarthrosis. Included view of the more distal lateral femoral diaphyseal cortex demonstrates focal thickening and associated linear lucency which suggests remote insufficiency fracture as previously described.  IMPRESSION: Status post right hip ORIF for nondisplaced intertrochanteric fracture, no radiographic findings of hardware failure, nor interval healing. No acute fracture deformity nor dislocation.   Electronically Signed   By: Awilda Metro   On: 12/21/2012 02:26   Dg Hip Complete Right  12/18/2012   CLINICAL DATA:  Postop hip surgery.  Right femoral fracture appear.  EXAM: RIGHT HIP - COMPLETE 2+ VIEW  COMPARISON:  11/20/2012.  FINDINGS: Plate and screw fixation of right subtrochanteric hip fracture. Good anatomic alignment. No change from prior exam. Lesser tuberosity is again noted to be fractured. Degenerative changes right hip. Calcified pelvic densities consistent phleboliths.  IMPRESSION: Patient status post open reduction internal fixation right subtrochanteric hip fracture with good anatomic alignment. No change from prior exam .   Electronically Signed   By: Maisie Fus  Register   On: 12/18/2012  11:03    MDM  No diagnosis found. Pt is alert, no acute distress.  Labs and ua wnl.  I personally performed the services described in this documentation, which was scribed in my presence. The recorded information has been reviewed and is accurate.    I personally performed the services described in this documentation, which was scribed in my presence. The recorded information has been reviewed and is accurate.   Donnetta Hutching, MD 12/23/12 1328

## 2012-12-23 NOTE — ED Notes (Signed)
Pt comes from [facility] with c/o generalized weakness and states "I just don't feel right. I feel bad all over".

## 2012-12-25 ENCOUNTER — Other Ambulatory Visit (HOSPITAL_COMMUNITY): Payer: Self-pay | Admitting: Internal Medicine

## 2012-12-25 ENCOUNTER — Ambulatory Visit (HOSPITAL_COMMUNITY)
Admission: RE | Admit: 2012-12-25 | Discharge: 2012-12-25 | Disposition: A | Discharge status: Home / Self Care | Payer: Medicare Other | Source: Ambulatory Visit | Attending: Internal Medicine | Admitting: Internal Medicine

## 2012-12-25 DIAGNOSIS — G319 Degenerative disease of nervous system, unspecified: Secondary | ICD-10-CM | POA: Insufficient documentation

## 2012-12-25 DIAGNOSIS — R410 Disorientation, unspecified: Secondary | ICD-10-CM

## 2012-12-25 DIAGNOSIS — F29 Unspecified psychosis not due to a substance or known physiological condition: Secondary | ICD-10-CM | POA: Insufficient documentation

## 2012-12-25 DIAGNOSIS — S0990XA Unspecified injury of head, initial encounter: Secondary | ICD-10-CM | POA: Insufficient documentation

## 2012-12-25 DIAGNOSIS — W19XXXA Unspecified fall, initial encounter: Secondary | ICD-10-CM | POA: Insufficient documentation

## 2012-12-25 DIAGNOSIS — I6789 Other cerebrovascular disease: Secondary | ICD-10-CM | POA: Insufficient documentation

## 2013-01-15 NOTE — Progress Notes (Unsigned)
NAMEJAMAIRA, [name]                 ACCOUNT NO.:  [id number]  MEDICAL RECORD NO.:  [id number]  LOCATION:                                 FACILITY:  PHYSICIAN:  Kingsley Callander. [name], MD       DATE OF BIRTH:  08-06-1928  DATE OF PROCEDURE:  01/12/2013 DATE OF DISCHARGE:                                PROGRESS NOTE   SUBJECTIVE:  [name] is sitting up in a wheelchair this morning.  She appears comfortable.  She has continued with physical therapy.  Her recent period of confusion resolved.  CT scan revealed no acute abnormality.  Tramadol has been discontinued.  She is using Tylenol only for pain.  OBJECTIVE:  LUNGS:  Clear. HEART:  Regular with no murmurs. ABDOMEN:  Soft and nontender. EXTREMITIES:  No edema.  Her right upper thigh wound is well healed.  IMPRESSION/PLAN: 1. Right hip fracture, improving with therapy.  Continue physical     therapy.  Continue Tylenol as needed without tramadol. 2. Hypertension and orthostatic hypotension, stable. 3. Anxiety, stable.     Kingsley Callander. [name], MD     ROF/MEDQ  D:  01/12/2013  T:  01/13/2013  Job:  [id number]

## 2013-02-24 ENCOUNTER — Encounter (HOSPITAL_COMMUNITY): Payer: Medicare Other

## 2013-02-24 ENCOUNTER — Encounter (HOSPITAL_COMMUNITY)
Admission: RE | Admit: 2013-02-24 | Discharge: 2013-02-24 | Disposition: A | Discharge status: Home / Self Care | Payer: Medicare Other | Source: Ambulatory Visit | Attending: Internal Medicine | Admitting: Internal Medicine

## 2013-04-07 ENCOUNTER — Other Ambulatory Visit (HOSPITAL_COMMUNITY): Payer: Self-pay | Admitting: Internal Medicine

## 2013-04-07 DIAGNOSIS — S72001A Fracture of unspecified part of neck of right femur, initial encounter for closed fracture: Secondary | ICD-10-CM

## 2013-04-07 DIAGNOSIS — M810 Age-related osteoporosis without current pathological fracture: Secondary | ICD-10-CM

## 2013-04-09 ENCOUNTER — Ambulatory Visit (HOSPITAL_COMMUNITY)
Admission: RE | Admit: 2013-04-09 | Discharge: 2013-04-09 | Disposition: A | Discharge status: Home / Self Care | Payer: Medicare Other | Source: Ambulatory Visit | Attending: Internal Medicine | Admitting: Internal Medicine

## 2013-04-09 DIAGNOSIS — M818 Other osteoporosis without current pathological fracture: Secondary | ICD-10-CM | POA: Insufficient documentation

## 2013-04-09 DIAGNOSIS — E559 Vitamin D deficiency, unspecified: Secondary | ICD-10-CM | POA: Insufficient documentation

## 2013-04-09 DIAGNOSIS — M810 Age-related osteoporosis without current pathological fracture: Secondary | ICD-10-CM

## 2013-04-09 DIAGNOSIS — S72001A Fracture of unspecified part of neck of right femur, initial encounter for closed fracture: Secondary | ICD-10-CM

## 2013-04-09 DIAGNOSIS — Z780 Asymptomatic menopausal state: Secondary | ICD-10-CM | POA: Insufficient documentation

## 2013-04-17 ENCOUNTER — Encounter (HOSPITAL_COMMUNITY): Payer: Self-pay | Admitting: Emergency Medicine

## 2013-04-17 ENCOUNTER — Emergency Department (HOSPITAL_COMMUNITY)
Admission: EM | Admit: 2013-04-17 | Discharge: 2013-04-17 | Disposition: A | Discharge status: Home / Self Care | Payer: Medicare Other | Attending: Emergency Medicine | Admitting: Emergency Medicine

## 2013-04-17 DIAGNOSIS — R112 Nausea with vomiting, unspecified: Secondary | ICD-10-CM

## 2013-04-17 DIAGNOSIS — I10 Essential (primary) hypertension: Secondary | ICD-10-CM | POA: Insufficient documentation

## 2013-04-17 DIAGNOSIS — R197 Diarrhea, unspecified: Secondary | ICD-10-CM | POA: Insufficient documentation

## 2013-04-17 DIAGNOSIS — R5383 Other fatigue: Secondary | ICD-10-CM

## 2013-04-17 DIAGNOSIS — E860 Dehydration: Secondary | ICD-10-CM

## 2013-04-17 DIAGNOSIS — N390 Urinary tract infection, site not specified: Secondary | ICD-10-CM | POA: Insufficient documentation

## 2013-04-17 DIAGNOSIS — R51 Headache: Secondary | ICD-10-CM | POA: Insufficient documentation

## 2013-04-17 DIAGNOSIS — R5381 Other malaise: Secondary | ICD-10-CM | POA: Insufficient documentation

## 2013-04-17 DIAGNOSIS — Z8739 Personal history of other diseases of the musculoskeletal system and connective tissue: Secondary | ICD-10-CM | POA: Insufficient documentation

## 2013-04-17 LAB — URINALYSIS, ROUTINE W REFLEX MICROSCOPIC
Bilirubin Urine: NEGATIVE
Glucose, UA: NEGATIVE mg/dL
Ketones, ur: NEGATIVE mg/dL
Nitrite: POSITIVE — AB
Protein, ur: NEGATIVE mg/dL
Specific Gravity, Urine: <1.005 — ABNORMAL LOW (ref 1.005–1.030)
Urobilinogen, UA: 0.2 mg/dL (ref 0.0–1.0)
pH: 6 (ref 5.0–8.0)

## 2013-04-17 LAB — CBC WITH DIFFERENTIAL/PLATELET
Basophils Absolute: 0 K/uL (ref 0.0–0.1)
Basophils Relative: 0 % (ref 0–1)
Eosinophils Absolute: 0 K/uL (ref 0.0–0.7)
Eosinophils Relative: 1 % (ref 0–5)
HCT: 39.9 % (ref 36.0–46.0)
Hemoglobin: 13.5 g/dL (ref 12.0–15.0)
Lymphocytes Relative: 11 % — ABNORMAL LOW (ref 12–46)
Lymphs Abs: 0.7 K/uL (ref 0.7–4.0)
MCH: 31.9 pg (ref 26.0–34.0)
MCHC: 33.8 g/dL (ref 30.0–36.0)
MCV: 94.3 fL (ref 78.0–100.0)
Monocytes Absolute: 0.6 K/uL (ref 0.1–1.0)
Monocytes Relative: 9 % (ref 3–12)
Neutro Abs: 4.8 K/uL (ref 1.7–7.7)
Neutrophils Relative %: 79 % — ABNORMAL HIGH (ref 43–77)
Platelets: 173 K/uL (ref 150–400)
RBC: 4.23 MIL/uL (ref 3.87–5.11)
RDW: 13.5 % (ref 11.5–15.5)
WBC: 6.1 K/uL (ref 4.0–10.5)

## 2013-04-17 LAB — COMPREHENSIVE METABOLIC PANEL
ALBUMIN: 3.7 g/dL (ref 3.5–5.2)
ALK PHOS: 69 U/L (ref 39–117)
ALT: 9 U/L (ref 0–35)
AST: 21 U/L (ref 0–37)
BILIRUBIN TOTAL: 0.7 mg/dL (ref 0.3–1.2)
BUN: 14 mg/dL (ref 6–23)
CO2: 26 mEq/L (ref 19–32)
Calcium: 9.5 mg/dL (ref 8.4–10.5)
Chloride: 101 mEq/L (ref 96–112)
Creatinine, Ser: 0.68 mg/dL (ref 0.50–1.10)
GFR calc Af Amer: >90 mL/min (ref >90)
GFR calc non Af Amer: 79 mL/min — ABNORMAL LOW (ref >90)
Glucose, Bld: 141 mg/dL — ABNORMAL HIGH (ref 70–99)
POTASSIUM: 3.6 meq/L — AB (ref 3.7–5.3)
Sodium: 138 mEq/L (ref 137–147)
TOTAL PROTEIN: 7.2 g/dL (ref 6.0–8.3)

## 2013-04-17 LAB — URINE MICROSCOPIC-ADD ON

## 2013-04-17 MED ORDER — SODIUM CHLORIDE 0.9 % IV BOLUS (SEPSIS)
700.0000 mL | Freq: Once | INTRAVENOUS | Status: AC
  Administered 2013-04-17: 700 mL via INTRAVENOUS

## 2013-04-17 MED ORDER — SODIUM CHLORIDE 0.9 % IV BOLUS (SEPSIS)
500.0000 mL | Freq: Once | INTRAVENOUS | Status: AC
  Administered 2013-04-17: 500 mL via INTRAVENOUS

## 2013-04-17 MED ORDER — SULFAMETHOXAZOLE-TMP DS 800-160 MG PO TABS: 1.0000 | ORAL_TABLET | Freq: Two times a day (BID) | ORAL | Status: DC

## 2013-04-17 MED ORDER — SODIUM CHLORIDE 0.9 % IV SOLN
INTRAVENOUS | Status: DC
  Administered 2013-04-17: 12:00:00 via INTRAVENOUS

## 2013-04-17 MED ORDER — ONDANSETRON HCL 4 MG PO TABS: 4.0000 mg | ORAL_TABLET | Freq: Three times a day (TID) | ORAL | Status: DC | PRN

## 2013-04-17 MED ORDER — ONDANSETRON HCL 4 MG/2ML IJ SOLN
4.0000 mg | Freq: Once | INTRAMUSCULAR | Status: AC
  Administered 2013-04-17: 4 mg via INTRAVENOUS
  Filled 2013-04-17: qty 2

## 2013-04-17 MED ORDER — ONDANSETRON HCL 4 MG/2ML IJ SOLN: 4.0000 mg | Freq: Once | INTRAMUSCULAR | Status: DC

## 2013-04-17 NOTE — Discharge Instructions (Signed)
Drink plenty of fluids. Take the zofran for nausea or vomiting. Take imodium OTC for diarrhea if needed. Avoid milk until the diarrhea is gone, it will make the diarrhea continue. Return to the ED if you feel worse again.    Nausea and Vomiting Nausea is a sick feeling that often comes before throwing up (vomiting). Vomiting is a reflex where stomach contents come out of your mouth. Vomiting can cause severe loss of body fluids (dehydration). Children and elderly adults can become dehydrated quickly, especially if they also have diarrhea. Nausea and vomiting are symptoms of a condition or disease. It is important to find the cause of your symptoms. CAUSES   Direct irritation of the stomach lining. This irritation can result from increased acid production (gastroesophageal reflux disease), infection, food poisoning, taking certain medicines (such as nonsteroidal anti-inflammatory drugs), alcohol use, or tobacco use.  Signals from the brain.These signals could be caused by a headache, heat exposure, an inner ear disturbance, increased pressure in the brain from injury, infection, a tumor, or a concussion, pain, emotional stimulus, or metabolic problems.  An obstruction in the gastrointestinal tract (bowel obstruction).  Illnesses such as diabetes, hepatitis, gallbladder problems, appendicitis, kidney problems, cancer, sepsis, atypical symptoms of a heart attack, or eating disorders.  Medical treatments such as chemotherapy and radiation.  Receiving medicine that makes you sleep (general anesthetic) during surgery. DIAGNOSIS Your caregiver may ask for tests to be done if the problems do not improve after a few days. Tests may also be done if symptoms are severe or if the reason for the nausea and vomiting is not clear. Tests may include:  Urine tests.  Blood tests.  Stool tests.  Cultures (to look for evidence of infection).  X-rays or other imaging studies. Test results can help your  caregiver make decisions about treatment or the need for additional tests. TREATMENT You need to stay well hydrated. Drink frequently but in small amounts.You may wish to drink water, sports drinks, clear broth, or eat frozen ice pops or gelatin dessert to help stay hydrated.When you eat, eating slowly may help prevent nausea.There are also some antinausea medicines that may help prevent nausea. HOME CARE INSTRUCTIONS   Take all medicine as directed by your caregiver.  If you do not have an appetite, do not force yourself to eat. However, you must continue to drink fluids.  If you have an appetite, eat a normal diet unless your caregiver tells you differently.  Eat a variety of complex carbohydrates (rice, wheat, potatoes, bread), lean meats, yogurt, fruits, and vegetables.  Avoid high-fat foods because they are more difficult to digest.  Drink enough water and fluids to keep your urine clear or pale yellow.  If you are dehydrated, ask your caregiver for specific rehydration instructions. Signs of dehydration may include:  Severe thirst.  Dry lips and mouth.  Dizziness.  Dark urine.  Decreasing urine frequency and amount.  Confusion.  Rapid breathing or pulse. SEEK IMMEDIATE MEDICAL CARE IF:   You have blood or brown flecks (like coffee grounds) in your vomit.  You have black or bloody stools.  You have a severe headache or stiff neck.  You are confused.  You have severe abdominal pain.  You have chest pain or trouble breathing.  You do not urinate at least once every 8 hours.  You develop cold or clammy skin.  You continue to vomit for longer than 24 to 48 hours.  You have a fever. MAKE SURE YOU:  Understand these instructions.  Will watch your condition.  Will get help right away if you are not doing well or get worse. Document Released: 01/21/2005 Document Revised: 04/15/2011 Document Reviewed: 06/20/2010 [facility]Exit[facility] Patient Information 2014  [place], [place].  Diarrhea Diarrhea is frequent loose and watery bowel movements. It can cause you to feel weak and dehydrated. Dehydration can cause you to become tired and thirsty, have a dry mouth, and have decreased urination that often is dark yellow. Diarrhea is a sign of another problem, most often an infection that will not last long. In most cases, diarrhea typically lasts 2 3 days. However, it can last longer if it is a sign of something more serious. It is important to treat your diarrhea as directed by your caregive to lessen or prevent future episodes of diarrhea. CAUSES  Some common causes include:  Gastrointestinal infections caused by viruses, bacteria, or parasites.  Food poisoning or food allergies.  Certain medicines, such as antibiotics, chemotherapy, and laxatives.  Artificial sweeteners and fructose.  Digestive disorders. HOME CARE INSTRUCTIONS  Ensure adequate fluid intake (hydration): have 1 cup (8 oz) of fluid for each diarrhea episode. Avoid fluids that contain simple sugars or sports drinks, fruit juices, whole milk products, and sodas. Your urine should be clear or pale yellow if you are drinking enough fluids. Hydrate with an oral rehydration solution that you can purchase at pharmacies, retail stores, and online. You can prepare an oral rehydration solution at home by mixing the following ingredients together:    tsp table salt.   tsp baking soda.   tsp salt substitute containing potassium chloride.  1  tablespoons sugar.  1 L (34 oz) of water.  Certain foods and beverages may increase the speed at which food moves through the gastrointestinal (GI) tract. These foods and beverages should be avoided and include:  Caffeinated and alcoholic beverages.  High-fiber foods, such as raw fruits and vegetables, nuts, seeds, and whole grain breads and cereals.  Foods and beverages sweetened with sugar alcohols, such as xylitol, sorbitol, and mannitol.  Some  foods may be well tolerated and may help thicken stool including:  Starchy foods, such as rice, toast, pasta, low-sugar cereal, oatmeal, grits, baked potatoes, crackers, and bagels.  Bananas.  Applesauce.  Add probiotic-rich foods to help increase healthy bacteria in the GI tract, such as yogurt and fermented milk products.  Wash your hands well after each diarrhea episode.  Only take over-the-counter or prescription medicines as directed by your caregiver.  Take a warm bath to relieve any burning or pain from frequent diarrhea episodes. SEEK IMMEDIATE MEDICAL CARE IF:   You are unable to keep fluids down.  You have persistent vomiting.  You have blood in your stool, or your stools are black and tarry.  You do not urinate in 6 8 hours, or there is only a small amount of very dark urine.  You have abdominal pain that increases or localizes.  You have weakness, dizziness, confusion, or lightheadedness.  You have a severe headache.  Your diarrhea gets worse or does not get better.  You have a fever or persistent symptoms for more than 2 3 days.  You have a fever and your symptoms suddenly get worse. MAKE SURE YOU:   Understand these instructions.  Will watch your condition.  Will get help right away if you are not doing well or get worse. Document Released: 01/11/2002 Document Revised: 01/08/2012 Document Reviewed: 09/29/2011 [facility] OzarksExitCare Patient Information 2014 [place]Care, MarylandLLC.

## 2013-04-17 NOTE — ED Notes (Signed)
Complain of n/v/d that started Thursday. States she has not been able to eat and she is weak

## 2013-04-17 NOTE — ED Provider Notes (Signed)
CSN: [id number]     Arrival date & time 04/17/13  1044 History  This chart was scribed for Ward Givens, MD,  by Ashley Jacobs, ED Scribe. The patient was seen in room APA10/[facility]10 and the patient's care was started at 11:53 AM.    First MD Initiated Contact with Patient 04/17/13 1118     Chief Complaint  Patient presents with  . Abdominal Pain     (Consider location/radiation/quality/duration/timing/severity/associated sxs/prior Treatment) Patient is a 80 y.o. female presenting with abdominal pain. The history is provided by the patient, medical records and a relative. No language interpreter was used.  Abdominal Pain Associated symptoms: diarrhea, nausea and vomiting   Associated symptoms: no cough and no sore throat    HPI Comments: [name] is a 80 y.o. female who presents to the Emergency Department complaining of nausea and vomiting for the past two days. Pt as the associated symptoms weakness, lightheadedness, nausea and emesis. Pt has emesis after every meal and she is only able to retain salted crackers.  She is vomiting 2-3 times a day. She states only today she has some soreness in her lower abdomen when she started having diarrhea. She mentions having loose watery diarrhea that started this morning. She has had 2 episodes. Denies cough, fever and sore throat. Denies blood in her vomitus or her diarrhea.  Pt's son and daughter in law had similar symptoms yesterday (nausea and vomiting.) After the patient got ill, however they are feeling better. She denies eating any food she thinks made them ill.  Pt has surgery for a femur fracture in the fall and has been walking with a walker since.   Denies hx of frequent diarrhea and vomiting. Past Medical History  Diagnosis Date  . Hypertension   . Osteoarthritis    Past Surgical History  Procedure Laterality Date  . Kidney stone surgery    . Stress fractures of legs    . Appendectomy    . Tubal ligation    . Orif hip  fracture Right 11/20/2012    Procedure: OPEN REDUCTION INTERNAL FIXATION RIGHT HIP;  Surgeon: Darreld Mclean, MD;  Location: [facility];  Service: Orthopedics;  Laterality: Right;   No family history on file. History  Substance Use Topics  . Smoking status: Never Smoker   . Smokeless tobacco: Not on file  . Alcohol Use: No   Lives at home Uses a walker Lives with her son  OB History   Grav Para Term Preterm Abortions TAB SAB Ect Mult Living                 Review of Systems  HENT: Negative for sore throat.   Respiratory: Negative for cough.   Gastrointestinal: Positive for nausea, vomiting, abdominal pain and diarrhea. Negative for blood in stool.  Musculoskeletal: Positive for gait problem.  Neurological: Positive for weakness.  All other systems reviewed and are negative.      Allergies  Hydrocodone-acetaminophen  Home Medications   Current Outpatient Rx  Name  Route  Sig  Dispense  Refill  . acetaminophen (TYLENOL) 500 MG tablet   Oral   Take 500 mg by mouth every 6 (six) hours as needed. For pain         . cholecalciferol (VITAMIN D) 1000 UNITS tablet   Oral   Take 1,000 Units by mouth 2 (two) times daily.          Marland Kitchen losartan (COZAAR) 50 MG tablet   Oral  Take 50 mg by mouth daily.          BP 163/64  Pulse 68  Temp(Src) 98.2 F (36.8 C) (Oral)  Resp 20  Ht 5\' 2"  (1.575 m)  Wt 125 lb (56.7 kg)  BMI 22.86 kg/m2  SpO2 96%  Vital signs normal   Physical Exam  Nursing note and vitals reviewed. Constitutional: She is oriented to person, place, and time. She appears well-developed and well-nourished.  Non-toxic appearance. She does not appear ill. No distress.  HENT:  Head: Normocephalic and atraumatic.  Right Ear: External ear normal.  Left Ear: External ear normal.  Nose: Nose normal. No mucosal edema or rhinorrhea.  Mouth/Throat: Oropharynx is clear and moist and mucous membranes are normal. No dental abscesses or uvula swelling.  Tangerine  sized goiter that is stable and presented during childhood  Eyes: Conjunctivae and EOM are normal. Pupils are equal, round, and reactive to light.  Neck: Normal range of motion and full passive range of motion without pain. Neck supple.  Cardiovascular: Normal rate, regular rhythm and normal heart sounds.  Exam reveals no gallop and no friction rub.   No murmur heard. Pulmonary/Chest: Effort normal and breath sounds normal. No respiratory distress. She has no wheezes. She has no rhonchi. She has no rales. She exhibits no tenderness and no crepitus.  Abdominal: Soft. Normal appearance. She exhibits no distension. There is no tenderness. There is no rebound and no guarding.  Decreased bowel sounds  Musculoskeletal: Normal range of motion. She exhibits no edema and no tenderness.  Moves all extremities well.   Neurological: She is alert and oriented to person, place, and time. She has normal strength. No cranial nerve deficit.  Skin: Skin is warm, dry and intact. No rash noted. No erythema. No pallor.  Psychiatric: She has a normal mood and affect. Her speech is normal and behavior is normal. Her mood appears not anxious.    ED Course  Procedures (including critical care time)  Medications  0.9 %  sodium chloride infusion ( Intravenous New Bag/Given 04/17/13 1213)  ondansetron (ZOFRAN) injection 4 mg (4 mg Intravenous Not Given 04/17/13 1513)  sodium chloride 0.9 % bolus 700 mL (0 mLs Intravenous Stopped 04/17/13 1259)  ondansetron (ZOFRAN) injection 4 mg (4 mg Intravenous Given 04/17/13 1218)  sodium chloride 0.9 % bolus 500 mL (500 mLs Intravenous New Bag/Given 04/17/13 1514)    DIAGNOSTIC STUDIES: Oxygen Saturation is 96% on room air, normal by my interpretation.    COORDINATION OF CARE:  11:56 AM Discussed course of care with pt . Pt understands and agrees.  Recheck at 1300, patient is willing to start trying to drink fluids. States she's feeling better.  Recheck at 1430 patient states  she still has some nausea. She was given another dose of Zofran. She was given another 500 cc bolus of IV fluids. Patient states she has  no urinary symptoms such as dysuria, frequency or suprapubic pain. Will send urine culture and start treatment for her UTI.  Review of prior chart shows she had a urine culture done in May and October 2014. The October urine culture was negative, the May culture had 55,000 colonies of mixed species.  Recheck at 1540, patient states she's feeling better. We will have her ready to be discharged soon.  Labs Review Results for orders placed during the hospital encounter of 04/17/13  CBC WITH DIFFERENTIAL      Result Value Ref Range   WBC 6.1  4.0 - 10.5  K/uL   RBC 4.23  3.87 - 5.11 MIL/uL   Hemoglobin 13.5  12.0 - 15.0 g/dL   HCT 45.4  09.8 - 11.9 %   MCV 94.3  78.0 - 100.0 fL   MCH 31.9  26.0 - 34.0 pg   MCHC 33.8  30.0 - 36.0 g/dL   RDW 14.7  82.9 - 56.2 %   Platelets 173  150 - 400 K/uL   Neutrophils Relative % 79 (*) 43 - 77 %   Neutro Abs 4.8  1.7 - 7.7 K/uL   Lymphocytes Relative 11 (*) 12 - 46 %   Lymphs Abs 0.7  0.7 - 4.0 K/uL   Monocytes Relative 9  3 - 12 %   Monocytes Absolute 0.6  0.1 - 1.0 K/uL   Eosinophils Relative 1  0 - 5 %   Eosinophils Absolute 0.0  0.0 - 0.7 K/uL   Basophils Relative 0  0 - 1 %   Basophils Absolute 0.0  0.0 - 0.1 K/uL  COMPREHENSIVE METABOLIC PANEL      Result Value Ref Range   Sodium 138  137 - 147 mEq/L   Potassium 3.6 (*) 3.7 - 5.3 mEq/L   Chloride 101  96 - 112 mEq/L   CO2 26  19 - 32 mEq/L   Glucose, Bld 141 (*) 70 - 99 mg/dL   BUN 14  6 - 23 mg/dL   Creatinine, Ser 1.30  0.50 - 1.10 mg/dL   Calcium 9.5  8.4 - 86.5 mg/dL   Total Protein 7.2  6.0 - 8.3 g/dL   Albumin 3.7  3.5 - 5.2 g/dL   AST 21  0 - 37 U/L   ALT 9  0 - 35 U/L   Alkaline Phosphatase 69  39 - 117 U/L   Total Bilirubin 0.7  0.3 - 1.2 mg/dL   GFR calc non Af Amer 79 (*) >90 mL/min   GFR calc Af Amer >90  >90 mL/min  URINALYSIS,  ROUTINE W REFLEX MICROSCOPIC      Result Value Ref Range   Color, Urine YELLOW  YELLOW   APPearance HAZY (*) CLEAR   Specific Gravity, Urine <1.005 (*) 1.005 - 1.030   pH 6.0  5.0 - 8.0   Glucose, UA NEGATIVE  NEGATIVE mg/dL   Hgb urine dipstick SMALL (*) NEGATIVE   Bilirubin Urine NEGATIVE  NEGATIVE   Ketones, ur NEGATIVE  NEGATIVE mg/dL   Protein, ur NEGATIVE  NEGATIVE mg/dL   Urobilinogen, UA 0.2  0.0 - 1.0 mg/dL   Nitrite POSITIVE (*) NEGATIVE   Leukocytes, UA TRACE (*) NEGATIVE  URINE MICROSCOPIC-ADD ON      Result Value Ref Range   Squamous Epithelial / LPF RARE  RARE   WBC, UA 3-6  <3 WBC/hpf   RBC / HPF 0-2  <3 RBC/hpf   Bacteria, UA FEW (*) RARE   Laboratory interpretation all normal except possible uti    Imaging Review No results found.   EKG Interpretation None      MDM  pleasant elderly female presents with vomiting and diarrhea over the past 2-3 days with some dehydration and weakness. She is responding to IV fluids. She is being discharged home. Her urine is suggestive of a UTI although she does not have symptoms. She is being treated with antibiotics.    Final diagnoses:  Nausea vomiting and diarrhea  Dehydration  UTI (lower urinary tract infection)   New Prescriptions   ONDANSETRON (ZOFRAN) 4 MG TABLET  Take 1 tablet (4 mg total) by mouth every 8 (eight) hours as needed for nausea or vomiting.   SULFAMETHOXAZOLE-TRIMETHOPRIM (BACTRIM DS) 800-160 MG PER TABLET    Take 1 tablet by mouth 2 (two) times daily.    Plan discharge   Devoria Albe, MD, FACEP   I personally performed the services described in this documentation, which was scribed in my presence. The recorded information has been reviewed and considered.  Devoria Albe, MD, Armando Gang      Ward Givens, MD 04/17/13 [phone]

## 2013-04-19 LAB — URINE CULTURE
Colony Count: >=100000
Special Requests: NORMAL

## 2013-04-20 NOTE — ED Notes (Signed)
Post ED Visit - Positive Culture Follow-up: Successful Patient Follow-Up  Culture assessed and recommendations reviewed by: []  Wes Dulaney, Pharm.D., BCPS [x]  Celedonio MiyamotoJeremy Frens, Pharm.D., BCPS []  Georgina PillionElizabeth Martin, Pharm.D., BCPS []  ColumbusMinh Pham, 17[address] Rainbow BoulevardPharm.D., BCPS, AAHIVP []  Estella HuskMichelle Turner, Pharm.D., BCPS, AAHIVP  Positive urine culture  []  Patient discharged without antimicrobial prescription and treatment is now indicated [x]  Organism is resistant to prescribed ED discharge antimicrobial []  Patient with positive blood cultures  Changes discussed with ED provider: Dr Hyacinth MeekerMiller New antibiotic prescription :Follow up MD or ED for recheck per  Eber HongBrian Miller.  Larena Soxunnally, Moustapha Tooker Marie 04/20/2013, 4:29 PM

## 2013-04-21 ENCOUNTER — Telehealth (HOSPITAL_COMMUNITY): Payer: Self-pay | Admitting: *Deleted

## 2014-11-05 ENCOUNTER — Emergency Department (HOSPITAL_COMMUNITY)
Admission: EM | Admit: 2014-11-05 | Discharge: 2014-11-06 | Disposition: A | Discharge status: Home / Self Care | Payer: Medicare Other | Attending: Emergency Medicine | Admitting: Emergency Medicine

## 2014-11-05 ENCOUNTER — Encounter (HOSPITAL_COMMUNITY): Payer: Self-pay | Admitting: Emergency Medicine

## 2014-11-05 ENCOUNTER — Emergency Department (HOSPITAL_COMMUNITY): Payer: Medicare Other

## 2014-11-05 DIAGNOSIS — J159 Unspecified bacterial pneumonia: Secondary | ICD-10-CM | POA: Diagnosis not present

## 2014-11-05 DIAGNOSIS — J189 Pneumonia, unspecified organism: Secondary | ICD-10-CM

## 2014-11-05 DIAGNOSIS — K219 Gastro-esophageal reflux disease without esophagitis: Secondary | ICD-10-CM | POA: Insufficient documentation

## 2014-11-05 DIAGNOSIS — R42 Dizziness and giddiness: Secondary | ICD-10-CM | POA: Diagnosis not present

## 2014-11-05 DIAGNOSIS — Z79899 Other long term (current) drug therapy: Secondary | ICD-10-CM | POA: Insufficient documentation

## 2014-11-05 DIAGNOSIS — E559 Vitamin D deficiency, unspecified: Secondary | ICD-10-CM | POA: Diagnosis not present

## 2014-11-05 DIAGNOSIS — I10 Essential (primary) hypertension: Secondary | ICD-10-CM | POA: Diagnosis not present

## 2014-11-05 DIAGNOSIS — Z8739 Personal history of other diseases of the musculoskeletal system and connective tissue: Secondary | ICD-10-CM | POA: Diagnosis not present

## 2014-11-05 DIAGNOSIS — R079 Chest pain, unspecified: Secondary | ICD-10-CM | POA: Diagnosis present

## 2014-11-05 HISTORY — DX: Nontoxic goiter, unspecified: E04.9

## 2014-11-05 HISTORY — DX: Vitamin D deficiency, unspecified: E55.9

## 2014-11-05 HISTORY — DX: Age-related osteoporosis without current pathological fracture: M81.0

## 2014-11-05 HISTORY — DX: Gastro-esophageal reflux disease without esophagitis: K21.9

## 2014-11-05 LAB — CBC WITH DIFFERENTIAL/PLATELET
BASOS ABS: 0 10*3/uL (ref 0.0–0.1)
Basophils Relative: 1 %
EOS ABS: 0.1 10*3/uL (ref 0.0–0.7)
Eosinophils Relative: 1 %
HCT: 39.4 % (ref 36.0–46.0)
HEMOGLOBIN: 12.7 g/dL (ref 12.0–15.0)
Lymphocytes Relative: 38 %
Lymphs Abs: 1.9 10*3/uL (ref 0.7–4.0)
MCH: 30.4 pg (ref 26.0–34.0)
MCHC: 32.2 g/dL (ref 30.0–36.0)
MCV: 94.3 fL (ref 78.0–100.0)
Monocytes Absolute: 0.6 10*3/uL (ref 0.1–1.0)
Monocytes Relative: 12 %
NEUTROS PCT: 48 %
Neutro Abs: 2.5 10*3/uL (ref 1.7–7.7)
Platelets: 185 10*3/uL (ref 150–400)
RBC: 4.18 MIL/uL (ref 3.87–5.11)
RDW: 14.4 % (ref 11.5–15.5)
WBC: 5.1 10*3/uL (ref 4.0–10.5)

## 2014-11-05 LAB — COMPREHENSIVE METABOLIC PANEL
ALK PHOS: 58 U/L (ref 38–126)
ALT: 13 U/L — ABNORMAL LOW (ref 14–54)
ANION GAP: 6 (ref 5–15)
AST: 20 U/L (ref 15–41)
Albumin: 3.9 g/dL (ref 3.5–5.0)
BUN: 20 mg/dL (ref 6–20)
CALCIUM: 9.1 mg/dL (ref 8.9–10.3)
CHLORIDE: 111 mmol/L (ref 101–111)
CO2: 26 mmol/L (ref 22–32)
Creatinine, Ser: 0.85 mg/dL (ref 0.44–1.00)
GFR calc Af Amer: >60 mL/min (ref >60)
GFR calc non Af Amer: >60 mL/min (ref >60)
GLUCOSE: 107 mg/dL — AB (ref 65–99)
Potassium: 3.6 mmol/L (ref 3.5–5.1)
Sodium: 143 mmol/L (ref 135–145)
Total Bilirubin: 0.4 mg/dL (ref 0.3–1.2)
Total Protein: 7.1 g/dL (ref 6.5–8.1)

## 2014-11-05 LAB — TROPONIN I: Troponin I: <0.03 ng/mL (ref <0.031)

## 2014-11-05 NOTE — ED Provider Notes (Signed)
CSN: [id number]     Arrival date & time 11/05/14  2202 History  By signing my name below, I, [name], attest that this documentation has been prepared under the direction and in the presence of Kendra Crease, MD. Electronically Signed: Budd Gray, ED Scribe. 11/05/2014. 10:17 PM.    Chief Complaint  Patient presents with  . Dizziness   The history is provided by the patient. No language interpreter was used.   HPI Comments: [name] is a 81 y.o. female  With a PMHx of HTN who presents to the Emergency Department complaining of intermittent chest pain onset 3 hours ago, lasting for more than a few minutes before resolving at that time. She notes she is not currently in pain. She reports associated dizziness and residual chest soreness. She notes the dizziness is exacerbated with movement. Pt denies any other pains.  Past Medical History  Diagnosis Date  . Hypertension   . Osteoarthritis   . Osteoporosis   . Goiter   . Vitamin D insufficiency   . GERD (gastroesophageal reflux disease)    Past Surgical History  Procedure Laterality Date  . Kidney stone surgery    . Stress fractures of legs    . Appendectomy    . Tubal ligation    . Orif hip fracture Right 11/20/2012    Procedure: OPEN REDUCTION INTERNAL FIXATION RIGHT HIP;  Surgeon: Darreld Mclean, MD;  Location: [facility];  Service: Orthopedics;  Laterality: Right;   History reviewed. No pertinent family history. Social History  Substance Use Topics  . Smoking status: Never Smoker   . Smokeless tobacco: None  . Alcohol Use: No   OB History    No data available     Review of Systems  Cardiovascular: Positive for chest pain.  Neurological: Positive for dizziness.  All other systems reviewed and are negative.   Allergies  Hydrocodone-acetaminophen  Home Medications   Prior to Admission medications   Medication Sig Start Date End Date Taking? Authorizing Provider  acetaminophen (TYLENOL) 500 MG  tablet Take 1,000 mg by mouth 3 (three) times daily. For pain   Yes Historical Provider, MD  amLODipine (NORVASC) 2.5 MG tablet Take 2.5 mg by mouth daily.   Yes Historical Provider, MD  cetirizine (ZYRTEC) 10 MG tablet Take 10 mg by mouth daily.   Yes Historical Provider, MD  Cholecalciferol (VITAMIN D3) 5000 UNITS CAPS Take 5,000 Units by mouth daily.   Yes Historical Provider, MD  esomeprazole (NEXIUM) 40 MG capsule Take 40 mg by mouth daily at 12 noon.   Yes Historical Provider, MD  valsartan (DIOVAN) 320 MG tablet Take 320 mg by mouth daily.   Yes Historical Provider, MD  ondansetron (ZOFRAN) 4 MG tablet Take 1 tablet (4 mg total) by mouth every 8 (eight) hours as needed for nausea or vomiting. 04/17/13   Devoria Albe, MD  sulfamethoxazole-trimethoprim (BACTRIM DS) 800-160 MG per tablet Take 1 tablet by mouth 2 (two) times daily. 04/17/13   Devoria Albe, MD   BP 191/87 mmHg  Pulse 85  Temp(Src) 98.5 F (36.9 C) (Oral)  Resp 20  Ht  (1.651 m)  Wt 123 lb (55.792 kg)  BMI 20.47 kg/m2  SpO2 97% Physical Exam  Constitutional: She is oriented to person, place, and time. She appears well-developed and well-nourished. No distress.  HENT:  Head: Normocephalic and atraumatic.  Right Ear: Hearing normal.  Left Ear: Hearing normal.  Nose: Nose normal.  Mouth/Throat: Oropharynx is clear and  moist and mucous membranes are normal.  Eyes: Conjunctivae and EOM are normal. Pupils are equal, round, and reactive to light.  Neck: Normal range of motion. Neck supple.  Cardiovascular: Regular rhythm, S1 normal and S2 normal.  Exam reveals no gallop and no friction rub.   No murmur heard. Pulmonary/Chest: Effort normal and breath sounds normal. No respiratory distress. She exhibits no tenderness.  Abdominal: Soft. Normal appearance and bowel sounds are normal. There is no hepatosplenomegaly. There is no tenderness. There is no rebound, no guarding, no tenderness at McBurney's point and negative Murphy's  sign. No hernia.  Musculoskeletal: Normal range of motion.  Neurological: She is alert and oriented to person, place, and time. She has normal strength. No cranial nerve deficit or sensory deficit. Coordination normal. GCS eye subscore is 4. GCS verbal subscore is 5. GCS motor subscore is 6.  Skin: Skin is warm, dry and intact. No rash noted. No cyanosis.  Psychiatric: She has a normal mood and affect. Her speech is normal and behavior is normal. Thought content normal.  Nursing note and vitals reviewed.   ED Course  Procedures  DIAGNOSTIC STUDIES: Oxygen Saturation is 97% on RA, adequate by my interpretation.    COORDINATION OF CARE: 10:12 PM - Discussed plans to order diagnostic studies and imaging. Pt advised of plan for treatment and pt agrees.  Labs Review Labs Reviewed  COMPREHENSIVE METABOLIC PANEL - Abnormal; Notable for the following:    Glucose, Bld 107 (*)    ALT 13 (*)    All other components within normal limits  CBC WITH DIFFERENTIAL/PLATELET  TROPONIN I  URINALYSIS, ROUTINE W REFLEX MICROSCOPIC (NOT AT [facility])    Imaging Review Dg Chest 2 View  11/05/2014   CLINICAL DATA:  Chest pain  EXAM: CHEST  2 VIEW  COMPARISON:  11/19/2012  FINDINGS: Chronic cardiomegaly. Limited evaluation of aortic contours due to leftward rotation frontally.  Blunting at the left costophrenic sulcus suggesting small pleural effusion. There is a bandlike opacity overlapping the heart in the lateral projection, side indeterminate.  No pulmonary edema.  No air leak.  Lower thoracic compression fractures, with advanced height loss, likely at T8 and T11.  IMPRESSION: 1. Right middle lobe or lingula atelectasis versus bronchopneumonia. 2. Lower thoracic compression fractures, age-indeterminate but new from 2014. 3. Study limited by obliquity.   Electronically Signed   By: Marnee Spring M.D.   On: 11/05/2014 23:29   Ct Head Wo Contrast  11/05/2014   CLINICAL DATA:  Dizziness exacerbated with movement.  Intermittent chest pain for 3 hours. History of hypertension.  EXAM: CT HEAD WITHOUT CONTRAST  TECHNIQUE: Contiguous axial images were obtained from the base of the skull through the vertex without intravenous contrast.  COMPARISON:  CT head December 25, 2012  FINDINGS: The ventricles and sulci are normal for age. No intraparenchymal hemorrhage, mass effect nor midline shift. Patchy supratentorial white matter hypodensities are within normal range for patient's age and though non-specific suggest sequelae of chronic small vessel ischemic disease. No acute large vascular territory infarcts.  No abnormal extra-axial fluid collections. Basal cisterns are patent. Mild calcific atherosclerosis of the carotid siphons.  No skull fracture. Osteopenia. The included ocular globes and orbital contents are non-suspicious. The mastoid aircells and included paranasal sinuses are well-aerated.  IMPRESSION: Negative CT head for age, stable from December 25, 2012.   Electronically Signed   By: Awilda Metro M.D.   On: 11/05/2014 23:28   I have personally reviewed and evaluated these  images and lab results as part of my medical decision-making.   EKG Interpretation   Date/Time:  Saturday November 05 2014 22:13:28 EDT Ventricular Rate:  82 PR Interval:  170 QRS Duration: 120 QT Interval:  414 QTC Calculation: 483 R Axis:   -58 Text Interpretation:  Sinus rhythm Incomplete RBBB and LAFB Probable left  ventricular hypertrophy No significant change since last tracing Confirmed  by POLLINA  MD, CHRISTOPHER ([id number]) on 11/05/2014 10:39:21 PM      MDM   Final diagnoses:  None  Pneumonia  Patient presented to the ER for evaluation of dizziness primarily. She reported vague chest pain earlier today. It onset at rest, lasted for a few minutes and then resolved spontaneously. She has not had any recurrent pain. Pain occurred approximately 3 hours before coming to the ER. She has been pain free throughout her  evaluation here in the ER. Cardiac evaluation is unremarkable. Her only complaint here his mild dizziness, vital signs are stable. Neurologic examination was unremarkable.  Chest x-ray suspicious for pneumonia. Further discussion with the patient reveals that she has been experiencing cough. She will be treated with Levaquin. She is breathing comfortably with normal oxygenation. She is appropriate for return to nursing home, outpatient management.  I personally performed the services described in this documentation, which was scribed in my presence. The recorded information has been reviewed and is accurate.   Kendra Crease, MD 11/06/14 0001

## 2014-11-05 NOTE — ED Notes (Signed)
Pt c/o dizziness today.  States still dizzy with movement.  Pt c/o cp earlier but only sore at this time.  Pt stated cp went away and only lasted a few minutes. Pt is alert/oriented at this time.  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  Color wnl. Mm wet.

## 2014-11-05 NOTE — ED Notes (Signed)
Pt stated she used the restroom in xray and "they did not ask for a sample". Pt states she will be able to "go again in a little bit" due to her overactive bladder and does not want to have a cath at this time.

## 2014-11-06 DIAGNOSIS — J159 Unspecified bacterial pneumonia: Secondary | ICD-10-CM | POA: Diagnosis not present

## 2014-11-06 LAB — URINE MICROSCOPIC-ADD ON

## 2014-11-06 LAB — URINALYSIS, ROUTINE W REFLEX MICROSCOPIC
Bilirubin Urine: NEGATIVE
Glucose, UA: NEGATIVE mg/dL
Ketones, ur: NEGATIVE mg/dL
Leukocytes, UA: NEGATIVE
Nitrite: NEGATIVE
PROTEIN: NEGATIVE mg/dL
Specific Gravity, Urine: 1.015 (ref 1.005–1.030)
Urobilinogen, UA: 0.2 mg/dL (ref 0.0–1.0)
pH: 7 (ref 5.0–8.0)

## 2014-11-06 MED ORDER — LEVOFLOXACIN 750 MG PO TABS: 750.0000 mg | ORAL_TABLET | Freq: Every day | ORAL | Status: DC

## 2014-11-06 MED ORDER — LEVOFLOXACIN 750 MG PO TABS
750.0000 mg | ORAL_TABLET | Freq: Once | ORAL | Status: AC
  Administered 2014-11-06: 750 mg via ORAL
  Filled 2014-11-06: qty 1

## 2014-11-06 NOTE — Discharge Instructions (Signed)

## 2014-11-06 NOTE — ED Notes (Signed)
Discharge instructions given, pt caregiver demonstrated teach back and verbal understanding. No concerns voiced.  

## 2015-02-21 DIAGNOSIS — B351 Tinea unguium: Secondary | ICD-10-CM | POA: Diagnosis not present

## 2015-02-21 DIAGNOSIS — M79675 Pain in left toe(s): Secondary | ICD-10-CM | POA: Diagnosis not present

## 2015-06-07 DIAGNOSIS — M79675 Pain in left toe(s): Secondary | ICD-10-CM | POA: Diagnosis not present

## 2015-06-07 DIAGNOSIS — M79674 Pain in right toe(s): Secondary | ICD-10-CM | POA: Diagnosis not present

## 2015-06-07 DIAGNOSIS — B351 Tinea unguium: Secondary | ICD-10-CM | POA: Diagnosis not present

## 2015-08-30 DIAGNOSIS — B351 Tinea unguium: Secondary | ICD-10-CM | POA: Diagnosis not present

## 2015-08-30 DIAGNOSIS — M79674 Pain in right toe(s): Secondary | ICD-10-CM | POA: Diagnosis not present

## 2015-08-30 DIAGNOSIS — M79675 Pain in left toe(s): Secondary | ICD-10-CM | POA: Diagnosis not present

## 2015-12-14 DIAGNOSIS — M79674 Pain in right toe(s): Secondary | ICD-10-CM | POA: Diagnosis not present

## 2015-12-14 DIAGNOSIS — B351 Tinea unguium: Secondary | ICD-10-CM | POA: Diagnosis not present

## 2015-12-14 DIAGNOSIS — M79675 Pain in left toe(s): Secondary | ICD-10-CM | POA: Diagnosis not present

## 2016-04-01 DIAGNOSIS — B351 Tinea unguium: Secondary | ICD-10-CM | POA: Diagnosis not present

## 2016-04-01 DIAGNOSIS — M79674 Pain in right toe(s): Secondary | ICD-10-CM | POA: Diagnosis not present

## 2016-04-01 DIAGNOSIS — M79675 Pain in left toe(s): Secondary | ICD-10-CM | POA: Diagnosis not present

## 2016-05-14 ENCOUNTER — Observation Stay (HOSPITAL_COMMUNITY)
Admission: EM | Admit: 2016-05-14 | Discharge: 2016-05-15 | Disposition: A | Discharge status: Home / Self Care | Payer: Medicare Other | Attending: Internal Medicine | Admitting: Internal Medicine

## 2016-05-14 ENCOUNTER — Emergency Department (HOSPITAL_COMMUNITY): Payer: Medicare Other

## 2016-05-14 ENCOUNTER — Encounter (HOSPITAL_COMMUNITY): Payer: Self-pay | Admitting: *Deleted

## 2016-05-14 DIAGNOSIS — Z79899 Other long term (current) drug therapy: Secondary | ICD-10-CM | POA: Diagnosis not present

## 2016-05-14 DIAGNOSIS — R079 Chest pain, unspecified: Secondary | ICD-10-CM | POA: Diagnosis present

## 2016-05-14 DIAGNOSIS — E049 Nontoxic goiter, unspecified: Secondary | ICD-10-CM | POA: Diagnosis present

## 2016-05-14 DIAGNOSIS — I10 Essential (primary) hypertension: Secondary | ICD-10-CM | POA: Diagnosis not present

## 2016-05-14 DIAGNOSIS — R072 Precordial pain: Secondary | ICD-10-CM | POA: Diagnosis not present

## 2016-05-14 HISTORY — DX: Unspecified dementia, unspecified severity, without behavioral disturbance, psychotic disturbance, mood disturbance, and anxiety: F03.90

## 2016-05-14 HISTORY — DX: Anxiety disorder, unspecified: F41.9

## 2016-05-14 LAB — BASIC METABOLIC PANEL
ANION GAP: 11 (ref 5–15)
BUN: 17 mg/dL (ref 6–20)
CO2: 26 mmol/L (ref 22–32)
Calcium: 9.2 mg/dL (ref 8.9–10.3)
Chloride: 101 mmol/L (ref 101–111)
Creatinine, Ser: 0.71 mg/dL (ref 0.44–1.00)
GFR calc Af Amer: >60 mL/min (ref >60)
GFR calc non Af Amer: >60 mL/min (ref >60)
GLUCOSE: 93 mg/dL (ref 65–99)
POTASSIUM: 3.4 mmol/L — AB (ref 3.5–5.1)
Sodium: 138 mmol/L (ref 135–145)

## 2016-05-14 LAB — CBC
HEMATOCRIT: 40.4 % (ref 36.0–46.0)
HEMOGLOBIN: 13.1 g/dL (ref 12.0–15.0)
MCH: 31 pg (ref 26.0–34.0)
MCHC: 32.4 g/dL (ref 30.0–36.0)
MCV: 95.7 fL (ref 78.0–100.0)
Platelets: 219 10*3/uL (ref 150–400)
RBC: 4.22 MIL/uL (ref 3.87–5.11)
RDW: 13.5 % (ref 11.5–15.5)
WBC: 6.7 10*3/uL (ref 4.0–10.5)

## 2016-05-14 LAB — MRSA PCR SCREENING: MRSA BY PCR: NEGATIVE

## 2016-05-14 LAB — TROPONIN I: Troponin I: <0.03 ng/mL (ref <0.03)

## 2016-05-14 MED ORDER — MORPHINE SULFATE (PF) 2 MG/ML IV SOLN: 2.0000 mg | INTRAVENOUS | Status: DC | PRN

## 2016-05-14 MED ORDER — IRBESARTAN 300 MG PO TABS
300.0000 mg | ORAL_TABLET | Freq: Every day | ORAL | Status: DC
  Administered 2016-05-14: 300 mg via ORAL
  Filled 2016-05-14: qty 1

## 2016-05-14 MED ORDER — ENOXAPARIN SODIUM 40 MG/0.4ML ~~LOC~~ SOLN
40.0000 mg | SUBCUTANEOUS | Status: DC
  Administered 2016-05-14: 40 mg via SUBCUTANEOUS
  Filled 2016-05-14: qty 0.4

## 2016-05-14 MED ORDER — CITALOPRAM HYDROBROMIDE 20 MG PO TABS
10.0000 mg | ORAL_TABLET | Freq: Every day | ORAL | Status: DC
  Administered 2016-05-15: 10 mg via ORAL
  Filled 2016-05-14: qty 1

## 2016-05-14 MED ORDER — ONDANSETRON HCL 4 MG/2ML IJ SOLN: 4.0000 mg | Freq: Four times a day (QID) | INTRAMUSCULAR | Status: DC | PRN

## 2016-05-14 MED ORDER — GI COCKTAIL ~~LOC~~: 30.0000 mL | Freq: Four times a day (QID) | ORAL | Status: DC | PRN

## 2016-05-14 MED ORDER — ASPIRIN EC 325 MG PO TBEC
325.0000 mg | DELAYED_RELEASE_TABLET | Freq: Every day | ORAL | Status: DC
  Administered 2016-05-14 – 2016-05-15 (×2): 325 mg via ORAL
  Filled 2016-05-14 (×2): qty 1

## 2016-05-14 MED ORDER — HYDROCHLOROTHIAZIDE 12.5 MG PO CAPS
12.5000 mg | ORAL_CAPSULE | Freq: Every morning | ORAL | Status: DC
  Administered 2016-05-15: 12.5 mg via ORAL
  Filled 2016-05-14: qty 1

## 2016-05-14 MED ORDER — POTASSIUM CHLORIDE CRYS ER 20 MEQ PO TBCR
40.0000 meq | EXTENDED_RELEASE_TABLET | Freq: Once | ORAL | Status: AC
  Administered 2016-05-14: 40 meq via ORAL
  Filled 2016-05-14: qty 2

## 2016-05-14 MED ORDER — PANTOPRAZOLE SODIUM 40 MG PO TBEC
40.0000 mg | DELAYED_RELEASE_TABLET | Freq: Every day | ORAL | Status: DC
  Administered 2016-05-15: 40 mg via ORAL
  Filled 2016-05-14: qty 1

## 2016-05-14 MED ORDER — ACETAMINOPHEN 325 MG PO TABS: 650.0000 mg | ORAL_TABLET | ORAL | Status: DC | PRN

## 2016-05-14 MED ORDER — AMLODIPINE BESYLATE 5 MG PO TABS
2.5000 mg | ORAL_TABLET | Freq: Every day | ORAL | Status: DC
  Administered 2016-05-14: 2.5 mg via ORAL
  Filled 2016-05-14: qty 1

## 2016-05-14 NOTE — ED Provider Notes (Signed)
Emergency Department Provider Note   I have reviewed the triage vital signs and the nursing notes.   HISTORY  Chief Complaint Chest Pain   HPI [name] is a 83 y.o. female with PMH of anxiety, GERD, HTN, and OA presents to the ED for evaluation of chest pain. She had sudden onset chest pressure under her left breast approximately 2 hours PTA. She called EMS and was given ASA en route. No history of similar pain. No radiation of pain. No fever, chills, or productive cough. No modifying factors. She is currently chest pain free. No history of AMI, heart cath, or recent stress test. Pain was severe but improved.    Past Medical History:  Diagnosis Date  . Anxiety   . Dementia   . GERD (gastroesophageal reflux disease)   . Goiter   . Hypertension   . Osteoarthritis   . Osteoporosis   . Vitamin D insufficiency     Patient Active Problem List   Diagnosis Date Noted  . Chest pain 05/14/2016  . Intertrochanteric fracture of right hip (HCC) 11/19/2012  . UTI (urinary tract infection) 11/19/2012  . Dizziness and giddiness 06/14/2012  . HTN (hypertension) 06/14/2012  . Difficulty in walking(719.7) 09/09/2011  . Osteoarthritis of leg 09/09/2011  . Abnormality of gait 09/09/2011  . Pain in joint, lower leg 09/09/2011    Past Surgical History:  Procedure Laterality Date  . APPENDECTOMY    . KIDNEY STONE SURGERY    . ORIF HIP FRACTURE Right 11/20/2012   Procedure: OPEN REDUCTION INTERNAL FIXATION RIGHT HIP;  Surgeon: Darreld Mclean, MD;  Location: [facility];  Service: Orthopedics;  Laterality: Right;  . stress fractures of legs    . TUBAL LIGATION      Current Outpatient Rx  . Order #: [id number] Class: Historical Med  . Order #: [id number] Class: Historical Med  . Order #: [id number] Class: Historical Med  . Order #: [id number] Class: Historical Med  . Order #: [id number] Class: Historical Med  . Order #: [id number] Class: Historical Med     Allergies Hydrocodone-acetaminophen  No family history on file.  Social History Social History  Substance Use Topics  . Smoking status: Never Smoker  . Smokeless tobacco: Never Used  . Alcohol use No    Review of Systems  Constitutional: No fever/chills Eyes: No visual changes. ENT: No sore throat. Cardiovascular: Positive chest pain. Respiratory: Denies shortness of breath. Gastrointestinal: No abdominal pain.  No nausea, no vomiting.  No diarrhea.  No constipation. Genitourinary: Negative for dysuria. Musculoskeletal: Negative for back pain. Skin: Negative for rash. Neurological: Negative for headaches, focal weakness or numbness.  10-point ROS otherwise negative.  ____________________________________________   PHYSICAL EXAM:  VITAL SIGNS: ED Triage Vitals [05/14/16 1459]  Enc Vitals Group     BP (!) 170/79     Pulse Rate 79     Resp 18     Temp 98.8 F (37.1 C)     Temp Source Oral     SpO2 95 %     Weight 123 lb (55.8 kg)     Height  (1.651 m)     Pain Score 5   Constitutional: Alert and oriented. Well appearing and in no acute distress. Eyes: Conjunctivae are normal.  Head: Atraumatic. Nose: No congestion/rhinnorhea. Mouth/Throat: Mucous membranes are moist.  Oropharynx non-erythematous. Neck: No stridor.   Cardiovascular: Normal rate, regular rhythm. Good peripheral circulation. Grossly normal heart sounds.   Respiratory: Normal respiratory effort.  No retractions. Lungs CTAB. Gastrointestinal: Soft  and nontender. No distention.  Musculoskeletal: No lower extremity tenderness nor edema. No gross deformities of extremities. Neurologic:  Normal speech and language. No gross focal neurologic deficits are appreciated.  Skin:  Skin is warm, dry and intact. No rash noted.  ____________________________________________   LABS (all labs ordered are listed, but only abnormal results are displayed)  Labs Reviewed  BASIC METABOLIC PANEL -  Abnormal; Notable for the following:       Result Value   Potassium 3.4 (*)    All other components within normal limits  CBC  TROPONIN I   ____________________________________________  EKG   EKG Interpretation  Date/Time:  Tuesday May 14 2016 15:04:26 EDT Ventricular Rate:  77 PR Interval:    QRS Duration: 122 QT Interval:  409 QTC Calculation: 463 R Axis:   -50 Text Interpretation:  Sinus rhythm Ventricular premature complex RBBB and LAFB Left ventricular hypertrophy Similar to prior. No STEMI.  Confirmed by Shiryl Ruddy MD, Christean Silvestri [phone]) on 05/14/2016 3:07:27 PM       ____________________________________________  RADIOLOGY  Dg Chest 2 View  Result Date: 05/14/2016 CLINICAL DATA:  Left chest pain, hypertension EXAM: CHEST  2 VIEW COMPARISON:  11/05/2014 FINDINGS: Stable cardiomegaly without CHF or definite pneumonia. Resolving bibasilar atelectasis with improved aeration. No significant enlarging effusion or pneumothorax. Atherosclerosis noted of the aorta. Hiatal hernia evident. Degenerative changes of the spine and increased thoracic kyphosis related to multiple thoracic compression fractures, similar to 11/05/2014. IMPRESSION: Minor basilar atelectasis versus scarring, worse on the left. Cardiomegaly without CHF or edema Thoracic aortic atherosclerosis Suspect hiatal hernia Chronic thoracic spine compression fractures with increased kyphosis. Electronically Signed   By: Judie Petit.  Shick M.D.   On: 05/14/2016 15:33    ____________________________________________   PROCEDURES  Procedure(s) performed:   Procedures  None ____________________________________________   INITIAL IMPRESSION / ASSESSMENT AND PLAN / ED COURSE  Pertinent labs & imaging results that were available during my care of the patient were reviewed by me and considered in my medical decision making (see chart for details).  Patient with PMH of HTN presents to the ED for evaluation of chest pressure that  started today after lunch. Not reproducible on my exam. Initial troponin is negative. Plan for admission for chest pain evaluation, enzyme trending, and consideration of stress testing.   04:30 PM  Discussed patient's case with hospitalist, Dr. Ophelia Charter. Patient and family (if present) updated with plan. Care transferred to hospitalist service.  I reviewed all nursing notes, vitals, pertinent old records, EKGs, labs, imaging (as available).  ____________________________________________  FINAL CLINICAL IMPRESSION(S) / ED DIAGNOSES  Final diagnoses:  Precordial chest pain     MEDICATIONS GIVEN DURING THIS VISIT:  None  NEW OUTPATIENT MEDICATIONS STARTED DURING THIS VISIT:  None   Note:  This document was prepared using Dragon voice recognition software and may include unintentional dictation errors.  Alona Bene, MD Emergency Medicine   Maia Plan, MD 05/14/16 [phone]

## 2016-05-14 NOTE — ED Triage Notes (Signed)
Pt comes in from [facility] for chest pain starting around 1415. This pain moves into his back. Pain is reproducible upon palpation. Denies any sob, coughing, n/v. Pt was given 4 ASA by EMS. IV is established.

## 2016-05-14 NOTE — H&P (Addendum)
History and Physical    [name] ZOX:[id number] DOB: 09-16-1928 DOA: 05/14/2016  PCP: Carylon Perches, MD Consultants:  None Patient coming from: [facility]; NOK: oldest son, Alecia Lemming [phone]  Chief Complaint: chest pain  HPI: [name] is a 83 y.o. female with medical history significant of HTN, mild dementia, and goiter presenting with chest pain.  Pain didn't leave, just got worse.  Tried to wait but decided she was going to die if she waited.  She was in the kitchen when it started, but she does not really exert herself since she lives in [place].  Substernal, radiated to the left.  No SOB, no n/v.  Finally eased up " so I could breathe better."  Has had similar pain but not nearly this severe.  Remote h/o stress test.  No h/o stents, intervention.  Very remote h/o cath (10-15 years), did not require intervention.  Now feeling "relaxed and easy", has some heaviness in her chest but no longer with pain.   ED Course: EKG negative, troponin negative.  Review of Systems: As per HPI; otherwise 10 point review of systems reviewed and negative.   Ambulatory Status:  Ambulates with a walker  Past Medical History:  Diagnosis Date  . Anxiety   . Dementia   . GERD (gastroesophageal reflux disease)   . Goiter   . Hypertension   . Osteoarthritis   . Osteoporosis   . Vitamin D insufficiency     Past Surgical History:  Procedure Laterality Date  . APPENDECTOMY    . KIDNEY STONE SURGERY    . ORIF HIP FRACTURE Right 11/20/2012   Procedure: OPEN REDUCTION INTERNAL FIXATION RIGHT HIP;  Surgeon: Darreld Mclean, MD;  Location: [facility];  Service: Orthopedics;  Laterality: Right;  . stress fractures of legs    . TUBAL LIGATION      Social History   Social History  . Marital status: Widowed    Spouse name: N/A  . Number of children: N/A  . Years of education: N/A   Occupational History  . Not on file.   Social History Main Topics  . Smoking status: Never Smoker  .  Smokeless tobacco: Never Used  . Alcohol use No  . Drug use: No  . Sexual activity: No   Other Topics Concern  . Not on file   Social History Narrative  . No narrative on file    Allergies  Allergen Reactions  . Hydrocodone-Acetaminophen Nausea And Vomiting    History reviewed. No pertinent family history.  Prior to Admission medications   Medication Sig Start Date End Date Taking? Authorizing Provider  acetaminophen (TYLENOL) 500 MG tablet Take 1,000 mg by mouth 3 (three) times daily. For pain    Historical Provider, MD  amLODipine (NORVASC) 2.5 MG tablet Take 2.5 mg by mouth daily.    Historical Provider, MD  cetirizine (ZYRTEC) 10 MG tablet Take 10 mg by mouth daily.    Historical Provider, MD  Cholecalciferol (VITAMIN D3) 5000 UNITS CAPS Take 5,000 Units by mouth daily.    Historical Provider, MD  esomeprazole (NEXIUM) 40 MG capsule Take 40 mg by mouth daily at 12 noon.    Historical Provider, MD  valsartan (DIOVAN) 320 MG tablet Take 320 mg by mouth daily.    Historical Provider, MD    Physical Exam: Vitals:   05/14/16 1459 05/14/16 1530 05/14/16 1630 05/14/16 1700  BP: (!) 170/79 (!) 149/87 (!) 149/88   Pulse: 79 75 81 78  Resp: Temp: 98.8 F (37.1 C)     TempSrc: Oral     SpO2: 95% 95% 93% 97%  Weight: 55.8 kg (123 lb)     Height:  (1.651 m)        General:  Appears calm and comfortable and is NAD Eyes:  PERRL, EOMI, normal lids, iris ENT:  grossly normal hearing, lips & tongue, mmm Neck:  no LAD, masses; large prominent goiter which family says has been present long-term and is unchanged Cardiovascular:  RRR, no m/r/g. No LE edema.  Respiratory:  CTA bilaterally, no w/r/r. Normal respiratory effort. Abdomen:  soft, ntnd, NABS Skin:  no rash or induration seen on limited exam Musculoskeletal:  grossly normal tone BUE/BLE, good ROM, no bony abnormality Psychiatric:  grossly normal mood and affect, speech fluent and appropriate, AOx3,  occasionally mildly confused but overall able to answer questions appropriately Neurologic:  CN 2-12 grossly intact, moves all extremities in coordinated fashion, sensation intact  Labs on Admission: I have personally reviewed following labs and imaging studies  CBC:  Recent Labs Lab 05/14/16 1502  WBC 6.7  HGB 13.1  HCT 40.4  MCV 95.7  PLT 219   Basic Metabolic Panel:  Recent Labs Lab 05/14/16 1502  NA 138  K 3.4*  CL 101  CO2 26  GLUCOSE 93  BUN 17  CREATININE 0.71  CALCIUM 9.2   GFR: Estimated Creatinine Clearance: 45.3 mL/min (by C-G formula based on SCr of 0.71 mg/dL). Liver Function Tests: No results for input(s): AST, ALT, ALKPHOS, BILITOT, PROT, ALBUMIN in the last 168 hours. No results for input(s): LIPASE, AMYLASE in the last 168 hours. No results for input(s): AMMONIA in the last 168 hours. Coagulation Profile: No results for input(s): INR, PROTIME in the last 168 hours. Cardiac Enzymes:  Recent Labs Lab 05/14/16 1502  TROPONINI <0.03   BNP (last 3 results) No results for input(s): PROBNP in the last 8760 hours. HbA1C: No results for input(s): HGBA1C in the last 72 hours. CBG: No results for input(s): GLUCAP in the last 168 hours. Lipid Profile: No results for input(s): CHOL, HDL, LDLCALC, TRIG, CHOLHDL, LDLDIRECT in the last 72 hours. Thyroid Function Tests: No results for input(s): TSH, T4TOTAL, FREET4, T3FREE, THYROIDAB in the last 72 hours. Anemia Panel: No results for input(s): VITAMINB12, FOLATE, FERRITIN, TIBC, IRON, RETICCTPCT in the last 72 hours. Urine analysis:    Component Value Date/Time   COLORURINE YELLOW 11/05/2014 2333   APPEARANCEUR CLEAR 11/05/2014 2333   LABSPEC 1.015 11/05/2014 2333   PHURINE 7.0 11/05/2014 2333   GLUCOSEU NEGATIVE 11/05/2014 2333   HGBUR SMALL (A) 11/05/2014 2333   BILIRUBINUR NEGATIVE 11/05/2014 2333   KETONESUR NEGATIVE 11/05/2014 2333   PROTEINUR NEGATIVE 11/05/2014 2333   UROBILINOGEN 0.2  11/05/2014 2333   NITRITE NEGATIVE 11/05/2014 2333   LEUKOCYTESUR NEGATIVE 11/05/2014 2333    Creatinine Clearance: Estimated Creatinine Clearance: 45.3 mL/min (by C-G formula based on SCr of 0.71 mg/dL).  Sepsis Labs: (procalcitonin:4,lacticidven:4) )No results found for this or any previous visit (from the past 240 hour(s)).   Radiological Exams on Admission: Dg Chest 2 View  Result Date: 05/14/2016 CLINICAL DATA:  Left chest pain, hypertension EXAM: CHEST  2 VIEW COMPARISON:  11/05/2014 FINDINGS: Stable cardiomegaly without CHF or definite pneumonia. Resolving bibasilar atelectasis with improved aeration. No significant enlarging effusion or pneumothorax. Atherosclerosis noted of the aorta. Hiatal hernia evident. Degenerative changes of the spine and increased thoracic kyphosis related to multiple  thoracic compression fractures, similar to 11/05/2014. IMPRESSION: Minor basilar atelectasis versus scarring, worse on the left. Cardiomegaly without CHF or edema Thoracic aortic atherosclerosis Suspect hiatal hernia Chronic thoracic spine compression fractures with increased kyphosis. Electronically Signed   By: Judie Petit.  Shick M.D.   On: 05/14/2016 15:33    EKG: Independently reviewed.  NSR with rate 77; RBBB and LAFB, LVH, no evidence of acute ischemia  Assessment/Plan Principal Problem:   Chest pain Active Problems:   HTN (hypertension)   Goiter   Chest pain -Patient with substernal chest pain that was severe and has resolved now, leaving a mild heaviness; it was non-exertional (we think) and resolved spontaneously. -1/3 typical symptoms suggestive of noncardiac chest pain.  -CXR unremarkable.   -Initial cardiac troponin negative.  -EKG not indicative of acute ischemia.   -GRACE score [id number]; which predicts an in-hospital death rate of 2%.  -Will plan to place in observation status on telemetry to rule out ACS by overnight observation.  -cycle troponin q6h x 3 and repeat EKG in  AM -Start ASA 81 mg  daily -morphine given -Risk factor stratification with FLP; will also check TSH, particularly with h/o goiter -Cardiology consultation in AM - NPO for possible stress test  -Will plan to start Heparin drip if enzymes are positive and/or chest pain recurs  HTN -Takes Norvasc, HCTZ, and Diovan at home -Will continue  Goiter -Stable, according to family -Will check TSH  DVT prophylaxis: Lovenox  Code Status: DNR - confirmed with patient/family Family Communication: son and daughter present throughout evaluation Disposition Plan: Back to Assisted Living once clinically improved Consults called: Cardiology Admission status: It is my clinical opinion that referral for OBSERVATION is reasonable and necessary in this patient based on the above information provided. The aforementioned taken together are felt to place the patient at high risk for further clinical deterioration. However it is anticipated that the patient may be medically stable for discharge from the hospital within 24 to 48 hours.    Jonah Blue MD [facility]  If 7PM-7AM, please contact night-coverage [email or web address] Password [facility]  05/14/2016, 5:44 PM

## 2016-05-15 ENCOUNTER — Ambulatory Visit (HOSPITAL_COMMUNITY): Admission: RE | Admit: 2016-05-15 | Payer: Medicare Other | Source: Ambulatory Visit

## 2016-05-15 ENCOUNTER — Encounter (HOSPITAL_COMMUNITY): Payer: Self-pay | Admitting: Physician Assistant

## 2016-05-15 DIAGNOSIS — R011 Cardiac murmur, unspecified: Secondary | ICD-10-CM | POA: Diagnosis not present

## 2016-05-15 DIAGNOSIS — R072 Precordial pain: Secondary | ICD-10-CM | POA: Diagnosis not present

## 2016-05-15 DIAGNOSIS — I10 Essential (primary) hypertension: Secondary | ICD-10-CM | POA: Diagnosis not present

## 2016-05-15 DIAGNOSIS — E049 Nontoxic goiter, unspecified: Secondary | ICD-10-CM | POA: Diagnosis not present

## 2016-05-15 DIAGNOSIS — I209 Angina pectoris, unspecified: Secondary | ICD-10-CM

## 2016-05-15 LAB — TROPONIN I
Troponin I: <0.03 ng/mL (ref <0.03)
Troponin I: <0.03 ng/mL (ref <0.03)

## 2016-05-15 MED ORDER — DONEPEZIL HCL 5 MG PO TABS: 5.0000 mg | ORAL_TABLET | Freq: Every day | ORAL | 12 refills | Status: DC

## 2016-05-15 MED ORDER — AMLODIPINE BESYLATE 5 MG PO TABS: 5.0000 mg | ORAL_TABLET | Freq: Every day | ORAL | 0 refills | Status: DC

## 2016-05-15 MED ORDER — AMLODIPINE BESYLATE 5 MG PO TABS: 5.0000 mg | ORAL_TABLET | Freq: Every day | ORAL | Status: DC

## 2016-05-15 NOTE — Discharge Summary (Signed)
Physician Discharge Summary  [name] RUE:[id number] DOB: [birth year] DOA: 05/14/2016   Admit date: 05/14/2016 Discharge date: 05/15/2016  Discharge Diagnoses:  Principal Problem:   Chest pain Active Problems:   HTN (hypertension)   Goiter    Wt Readings from Last 3 Encounters:  05/14/16 139 lb 14.4 oz (63.5 kg)  11/05/14 123 lb (55.8 kg)  04/17/13 125 lb (56.7 kg)     Hospital Course:  This patient is an 83 year old female who presented with chest pain. Initial EKG revealed normal sinus rhythm with a right bundle branch block. Her pain resolved spontaneously. There were no significant associated symptoms. She was observed and monitored setting overnight. Troponins remained normal at 0.03. And asymptomatic and was felt to be stable for discharge on the morning of April 11. She'll be seen in follow-up in my office in one week. She will continue her usual antihypertensive regimen.  Her daughter expressed interest in treatment of her dementia at this point. She'll be started on Aricept 5 mg daily.  Condition at discharge is improved. Lungs are clear. Heart regular with no murmurs. Chest wall reveals tenderness on palpation. Abdomen is soft and nontender with no hepatosplenomegaly. She has a stable goiter. Extremities reveal no edema. Her EKG reveals normal sinus rhythm with a right bundle branch block. Chest x-ray reveals no acute infiltrate.   Discharge Instructions  Discharge Instructions    Diet - low sodium heart healthy    Complete by:  As directed    Increase activity slowly    Complete by:  As directed      Allergies as of 05/15/2016      Reactions   Hydrocodone-acetaminophen Nausea And Vomiting      Medication List    STOP taking these medications   Vitamin D3 5000 units Caps     TAKE these medications   acetaminophen 500 MG tablet Commonly known as:  TYLENOL Take 1,000 mg by mouth 3 (three) times daily. For pain   amLODipine 2.5 MG tablet Commonly known  as:  NORVASC Take 2.5 mg by mouth at bedtime.   citalopram 10 MG tablet Commonly known as:  CELEXA Take 10 mg by mouth daily.   donepezil 5 MG tablet Commonly known as:  ARICEPT Take 1 tablet (5 mg total) by mouth at bedtime.   esomeprazole 40 MG capsule Commonly known as:  NEXIUM Take 40 mg by mouth daily at 12 noon.   hydrochlorothiazide 12.5 MG capsule Commonly known as:  MICROZIDE Take 12.5 mg by mouth every morning.   loperamide 2 MG capsule Commonly known as:  IMODIUM Take 2 mg by mouth every 4 (four) hours as needed for diarrhea or loose stools.   TUSSIN DM 10-100 MG/5ML liquid Generic drug:  dextromethorphan-guaiFENesin Take 10 mLs by mouth every 6 (six) hours as needed for cough.   valsartan 320 MG tablet Commonly known as:  DIOVAN Take 320 mg by mouth at bedtime.        Sotero Brinkmeyer 05/15/2016

## 2016-05-15 NOTE — Progress Notes (Signed)
Pt's IV catheter removed and intact. Pt's IV site clean dry and intact. Discharge instructions including medications and follow up appointments were reviewed and discussed with patient's caregiver at [facility]. All questions were answered and no further questions at this time. Pt in stable condition and in no acute distress at time of discharge. Pt escorted by RN and transported by [employer].

## 2016-05-15 NOTE — Consult Note (Signed)
Cardiology Consultation   Patient ID: FALON HUESCA; [id number]; 1929/01/31   Admit date: 05/14/2016 Date of Consult: 05/15/2016  Referring MD:  Dr. [name] Cardiologist:new Consulting Cardiologist:Dr. Purvis Sheffield Patient Care Team: Carylon Perches, MD as PCP - General (Internal Medicine)    Reason for Consultation: chest pain   History of Present Illness: WADIE MATTIE is a 83 y.o. female with a hx of hypertension, dementia, GERD, goiter who was admitted with chest pain. Patient has dementia and most history is from daughter. Patient describes chest tightness that was severe and she thought she might die. No radiation, dyspnea, diaphoresis. Says it eased before she got here. Can't remember if she's had anything like this before.She had a cardiac cath 10-15 yrs ago at [facility] that daughter says was normal.   EKG IRBBB and LAFB with ST T changes more prominent from 2016 tracings. Troponins negative 3. Potassium 3.4 other labs normal.  Past Medical History:  Diagnosis Date  . Anxiety   . Dementia   . GERD (gastroesophageal reflux disease)   . Goiter   . Hypertension   . Osteoarthritis   . Osteoporosis   . Vitamin D insufficiency     Past Surgical History:  Procedure Laterality Date  . APPENDECTOMY    . KIDNEY STONE SURGERY    . ORIF HIP FRACTURE Right 11/20/2012   Procedure: OPEN REDUCTION INTERNAL FIXATION RIGHT HIP;  Surgeon: Darreld Mclean, MD;  Location: [facility];  Service: Orthopedics;  Laterality: Right;  . stress fractures of legs    . TUBAL LIGATION        Home Meds: Prior to Admission medications   Medication Sig Start Date End Date Taking? Authorizing Provider  acetaminophen (TYLENOL) 500 MG tablet Take 1,000 mg by mouth 3 (three) times daily. For pain   Yes Historical Provider, MD  amLODipine (NORVASC) 2.5 MG tablet Take 2.5 mg by mouth at bedtime.    Yes Historical Provider, MD  Cholecalciferol (VITAMIN D3) 5000 UNITS CAPS Take 5,000 Units by mouth every morning.    Yes  Historical Provider, MD  citalopram (CELEXA) 10 MG tablet Take 10 mg by mouth daily.   Yes Historical Provider, MD  dextromethorphan-guaiFENesin (TUSSIN DM) 10-100 MG/5ML liquid Take 10 mLs by mouth every 6 (six) hours as needed for cough.   Yes Historical Provider, MD  esomeprazole (NEXIUM) 40 MG capsule Take 40 mg by mouth daily at 12 noon.   Yes Historical Provider, MD  hydrochlorothiazide (MICROZIDE) 12.5 MG capsule Take 12.5 mg by mouth every morning.   Yes Historical Provider, MD  loperamide (IMODIUM) 2 MG capsule Take 2 mg by mouth every 4 (four) hours as needed for diarrhea or loose stools.   Yes Historical Provider, MD  valsartan (DIOVAN) 320 MG tablet Take 320 mg by mouth at bedtime.    Yes Historical Provider, MD  donepezil (ARICEPT) 5 MG tablet Take 1 tablet (5 mg total) by mouth at bedtime. 05/15/16   Carylon Perches, MD    Current Medications: . amLODipine  2.5 mg Oral QHS  . aspirin EC  325 mg Oral Daily  . citalopram  10 mg Oral Daily  . enoxaparin (LOVENOX) injection  40 mg Subcutaneous Q24H  . hydrochlorothiazide  12.5 mg Oral q morning - 10a  . irbesartan  300 mg Oral QHS  . pantoprazole  40 mg Oral Daily     Allergies:    Allergies  Allergen Reactions  . Hydrocodone-Acetaminophen Nausea And Vomiting    Social History:  The patient  reports that she has never smoked. She has never used smokeless tobacco. She reports that she does not drink alcohol or use drugs.    Family History:   The patient's father died suddenly when she was an infant. No other family members with heart disease.   ROS:  Please see the history of present illness.  Review of Systems  Unable to perform ROS: dementia   All other ROS reviewed and negative.      Vital Signs: Blood pressure (!) 164/64, pulse 73, temperature 98.3 F (36.8 C), temperature source Oral, resp. rate 16, height  (1.651 m), weight 139 lb 14.4 oz (63.5 kg), SpO2 95 %.   PHYSICAL EXAM: General:  Well nourished, well  developed, in no acute distress  HEENT: normal  Lymph: no adenopathy Neck: no JVD, golf ball sized thyroid goiter Endocrine:  No thryomegaly Vascular: bilateral carotid bruits; FA pulses 2+ bilaterally without bruits  Cardiac:  RRR; normal S1, S2; 2/6 sys murmur LSB,no rub, bruit, thrill, or heave Lungs:  Decreased breath sounds with scattered wheezes Abd: soft, nontender, no hepatomegaly  Ext: no edema, Good distal pulses bilaterally Musculoskeletal:  No deformities, BUE and BLE strength normal and equal Skin: warm and dry  Neuro:  CNs 2-12 intact, no focal abnormalities noted Psych:  Normal affect    EKG: Normal sinus rhythm with incomplete RBBB and LAFB with more prominent ST T wave changes  Telemetry: Normal sinus rhythm with PVCs  Labs:  Recent Labs  05/14/16 1502 05/14/16 1930 05/15/16 0028 05/15/16 0636  TROPONINI <0.03 <0.03 <0.03 <0.03   Lab Results  Component Value Date   WBC 6.7 05/14/2016   HGB 13.1 05/14/2016   HCT 40.4 05/14/2016   MCV 95.7 05/14/2016   PLT 219 05/14/2016    Recent Labs Lab 05/14/16 1502  NA 138  K 3.4*  CL 101  CO2 26  BUN 17  CREATININE 0.71  CALCIUM 9.2  GLUCOSE 93   No results found for: CHOL, HDL, LDLCALC, TRIG No results found for: DDIMER  Radiology/Studies:  Dg Chest 2 View  Result Date: 05/14/2016 CLINICAL DATA:  Left chest pain, hypertension EXAM: CHEST  2 VIEW COMPARISON:  11/05/2014 FINDINGS: Stable cardiomegaly without CHF or definite pneumonia. Resolving bibasilar atelectasis with improved aeration. No significant enlarging effusion or pneumothorax. Atherosclerosis noted of the aorta. Hiatal hernia evident. Degenerative changes of the spine and increased thoracic kyphosis related to multiple thoracic compression fractures, similar to 11/05/2014. IMPRESSION: Minor basilar atelectasis versus scarring, worse on the left. Cardiomegaly without CHF or edema Thoracic aortic atherosclerosis Suspect hiatal hernia Chronic  thoracic spine compression fractures with increased kyphosis. Electronically Signed   By: Judie Petit.  Shick M.D.   On: 05/14/2016 15:33     PROBLEM LIST:  Principal Problem:   Chest pain Active Problems:   HTN (hypertension)   Goiter     ASSESSMENT AND PLAN:  Chest pain difficult historian with dementia.with negative troponins 3 and nonspecific EKG changes. Normal cath 10-15 yrs ago according to dtr.Plan is to discharge back to [facility] house and can arrange outpatient lexiscan myoview. Another option discussed with daughter is medical therapy as I'm not sure she'd agree to cath if scan was abnormal. Will discuss with Dr. Purvis Sheffield  Hypertension BP elevated takes norvasc 2.5 mg daily and HCTZ 12.5 mg daily avapro 300 mg daily.  Dementia  Goiter  Carotid bruits. Should have dopplers  Systolic murmur consistent with MR echo would be helpful.  DNR  Signed, Jacolyn Reedy, PA-C  05/15/2016 8:27 AM   The patient was seen and examined, and I agree with the history, physical exam, assessment and plan as documented above, with modifications as noted below. I have also personally reviewed all relevant documentation, old records, labs, and both radiographic and cardiovascular studies. I have also independently interpreted old and new ECG's. 83 yr old woman with hypertension admitted with chest pain. She says symptoms began yesterday afternoon while she was cleaning her kitchen. She was alone at the time. Retrosternal in location and described as sharp. Said symptoms would abate and then return. Troponins were normal. ECG which I personally interpreted showed sinus rhythm with incomplete RBBB and LAFB.  I talked to her about outpatient stress testing and if findings were significantly abnormal, then proceeding with coronary angiography. She prefers Risk analyst.  She is hypertensive so I will increase amlodipine to 5 mg daily both for BP control and angina suppression.  I will order a 2-D  echocardiogram with Doppler to evaluate cardiac structure, function, and regional wall motion.  She can follow up in our office within the next several weeks.  Prentice Docker, MD, [facility]  05/15/2016 8:59 AM

## 2016-05-15 NOTE — Clinical Social Work Note (Signed)
LCSW spoke with [name] at [facility]. LCSW advised that patient had been discharged. [name] was agreeable to facility picking patient up and providing transportation.   LCSW spoke with patient's daughter, Inge Rise, and advised of patient's discharge. LCSW informed Ms. Eulah Pont that the facility had agreed to transport patient back to the facility.    LCSW sent clinicals.    LCSW signing off.      Dawna Jakes, Juleen China, LCSW

## 2016-06-03 ENCOUNTER — Encounter: Payer: Self-pay | Admitting: Cardiovascular Disease

## 2016-06-03 ENCOUNTER — Ambulatory Visit (INDEPENDENT_AMBULATORY_CARE_PROVIDER_SITE_OTHER): Payer: Medicare Other | Admitting: Cardiovascular Disease

## 2016-06-03 VITALS — BP 130/70 | HR 80 | Ht 65.0 in | Wt 140.0 lb

## 2016-06-03 DIAGNOSIS — I10 Essential (primary) hypertension: Secondary | ICD-10-CM | POA: Diagnosis not present

## 2016-06-03 DIAGNOSIS — R072 Precordial pain: Secondary | ICD-10-CM | POA: Diagnosis not present

## 2016-06-03 NOTE — Patient Instructions (Signed)
Your physician wants you to follow-up in: 6 months Dr Koneswaran You will receive a reminder letter in the mail two months in advance. If you don't receive a letter, please call our office to schedule the follow-up appointment.    Your physician recommends that you continue on your current medications as directed. Please refer to the Current Medication list given to you today.     If you need a refill on your cardiac medications before your next appointment, please call your pharmacy.     Thank you for choosing [facility] !         

## 2016-06-03 NOTE — Progress Notes (Signed)
SUBJECTIVE: The patient presents for follow up after being hospitalized for chest pain. I saw her in consultation. I initiated amlodipine for both BP control and anginal relief. She did not want stress testing as she would not agree to coronary angiography and preferred medical management.  She denies chest pain, dizziness, palpitations, and leg swelling. She has occasional exertional dyspnea.    Review of Systems: As per "subjective", otherwise negative.  Allergies  Allergen Reactions  . Hydrocodone-Acetaminophen Nausea And Vomiting    Current Outpatient Prescriptions  Medication Sig Dispense Refill  . acetaminophen (TYLENOL) 500 MG tablet Take 1,000 mg by mouth 3 (three) times daily. For pain    . amLODipine (NORVASC) 5 MG tablet Take 1 tablet (5 mg total) by mouth at bedtime. 30 tablet 0  . citalopram (CELEXA) 10 MG tablet Take 10 mg by mouth daily.    Marland Kitchen dextromethorphan-guaiFENesin (TUSSIN DM) 10-100 MG/5ML liquid Take 10 mLs by mouth every 6 (six) hours as needed for cough.    . donepezil (ARICEPT) 5 MG tablet Take 1 tablet (5 mg total) by mouth at bedtime. 30 tablet 12  . esomeprazole (NEXIUM) 40 MG capsule Take 40 mg by mouth daily at 12 noon.    . hydrochlorothiazide (MICROZIDE) 12.5 MG capsule Take 12.5 mg by mouth every morning.    . loperamide (IMODIUM) 2 MG capsule Take 2 mg by mouth every 4 (four) hours as needed for diarrhea or loose stools.    . valsartan (DIOVAN) 320 MG tablet Take 320 mg by mouth at bedtime.      No current facility-administered medications for this visit.     Past Medical History:  Diagnosis Date  . Anxiety   . Dementia   . GERD (gastroesophageal reflux disease)   . Goiter   . Hypertension   . Osteoarthritis   . Osteoporosis   . Vitamin D insufficiency     Past Surgical History:  Procedure Laterality Date  . APPENDECTOMY    . KIDNEY STONE SURGERY    . ORIF HIP FRACTURE Right 11/20/2012   Procedure: OPEN REDUCTION INTERNAL  FIXATION RIGHT HIP;  Surgeon: Darreld Mclean, MD;  Location: [facility];  Service: Orthopedics;  Laterality: Right;  . stress fractures of legs    . TUBAL LIGATION      Social History   Social History  . Marital status: Widowed    Spouse name: N/A  . Number of children: N/A  . Years of education: N/A   Occupational History  . Not on file.   Social History Main Topics  . Smoking status: Never Smoker  . Smokeless tobacco: Never Used  . Alcohol use No  . Drug use: No  . Sexual activity: No   Other Topics Concern  . Not on file   Social History Narrative  . No narrative on file     Vitals:   06/03/16 1322  BP: 130/70  Pulse: 80  SpO2: 94%  Weight: 140 lb (63.5 kg)  Height:  (1.651 m)    Wt Readings from Last 3 Encounters:  06/03/16 140 lb (63.5 kg)  05/14/16 139 lb 14.4 oz (63.5 kg)  11/05/14 123 lb (55.8 kg)     PHYSICAL EXAM General: NAD HEENT: Normal. Neck: No JVD, no thyromegaly. Lungs: Clear to auscultation bilaterally with normal respiratory effort. CV: Nondisplaced PMI.  Regular rate and rhythm, normal S1/S2, no S3/S4, no murmur. No pretibial or periankle edema.  Abdomen: Soft, nontender, no distention.  Neurologic: Alert and oriented.  Psych: Normal affect. Skin: Normal. Musculoskeletal: No gross deformities.    ECG: Most recent ECG reviewed.   Labs:    Lipids: No results found for: LDLCALC, LDLDIRECT, CHOL, TRIG, HDL     ASSESSMENT AND PLAN: 1. Chest pain: Symptomatically stable. She did not want stress testing as she would not agree to coronary angiography and preferred medical management. Continue amlodipine for both BP control and anginal relief.  2. Hypertension: Controlled. No changes.    Disposition: Follow up 6 months.  Prentice Docker, M.D., F.A.C.C.

## 2016-06-10 DIAGNOSIS — M79675 Pain in left toe(s): Secondary | ICD-10-CM | POA: Diagnosis not present

## 2016-06-10 DIAGNOSIS — B351 Tinea unguium: Secondary | ICD-10-CM | POA: Diagnosis not present

## 2016-06-10 DIAGNOSIS — M79674 Pain in right toe(s): Secondary | ICD-10-CM | POA: Diagnosis not present

## 2016-08-12 DIAGNOSIS — M79675 Pain in left toe(s): Secondary | ICD-10-CM | POA: Diagnosis not present

## 2016-08-12 DIAGNOSIS — M79674 Pain in right toe(s): Secondary | ICD-10-CM | POA: Diagnosis not present

## 2016-08-12 DIAGNOSIS — B351 Tinea unguium: Secondary | ICD-10-CM | POA: Diagnosis not present

## 2016-10-28 DIAGNOSIS — M79674 Pain in right toe(s): Secondary | ICD-10-CM | POA: Diagnosis not present

## 2016-10-28 DIAGNOSIS — M79675 Pain in left toe(s): Secondary | ICD-10-CM | POA: Diagnosis not present

## 2016-10-28 DIAGNOSIS — B351 Tinea unguium: Secondary | ICD-10-CM | POA: Diagnosis not present

## 2016-11-29 DIAGNOSIS — Z23 Encounter for immunization: Secondary | ICD-10-CM | POA: Diagnosis not present

## 2016-12-04 ENCOUNTER — Encounter: Payer: Self-pay | Admitting: Cardiovascular Disease

## 2016-12-04 ENCOUNTER — Ambulatory Visit (INDEPENDENT_AMBULATORY_CARE_PROVIDER_SITE_OTHER): Payer: Medicare Other | Admitting: Cardiovascular Disease

## 2016-12-04 VITALS — BP 130/68 | HR 78 | Ht 65.5 in | Wt 143.0 lb

## 2016-12-04 DIAGNOSIS — I10 Essential (primary) hypertension: Secondary | ICD-10-CM | POA: Diagnosis not present

## 2016-12-04 DIAGNOSIS — R072 Precordial pain: Secondary | ICD-10-CM

## 2016-12-04 DIAGNOSIS — R531 Weakness: Secondary | ICD-10-CM

## 2016-12-04 LAB — CBC WITH DIFFERENTIAL/PLATELET
BASOS ABS: 43 {cells}/uL (ref 0–200)
Basophils Relative: 0.5 %
EOS PCT: 2.8 %
Eosinophils Absolute: 241 cells/uL (ref 15–500)
HCT: 39.5 % (ref 35.0–45.0)
Hemoglobin: 13 g/dL (ref 11.7–15.5)
Lymphs Abs: 1746 cells/uL (ref 850–3900)
MCH: 30.4 pg (ref 27.0–33.0)
MCHC: 32.9 g/dL (ref 32.0–36.0)
MCV: 92.3 fL (ref 80.0–100.0)
MONOS PCT: 9.3 %
MPV: 10.5 fL (ref 7.5–12.5)
NEUTROS PCT: 67.1 %
Neutro Abs: 5771 cells/uL (ref 1500–7800)
PLATELETS: 275 10*3/uL (ref 140–400)
RBC: 4.28 10*6/uL (ref 3.80–5.10)
RDW: 12.7 % (ref 11.0–15.0)
TOTAL LYMPHOCYTE: 20.3 %
WBC mixed population: 800 cells/uL (ref 200–950)
WBC: 8.6 10*3/uL (ref 3.8–10.8)

## 2016-12-04 LAB — BASIC METABOLIC PANEL
BUN/Creatinine Ratio: 17 (calc) (ref 6–22)
BUN: 16 mg/dL (ref 7–25)
CALCIUM: 9.3 mg/dL (ref 8.6–10.4)
CO2: 27 mmol/L (ref 20–32)
Chloride: 102 mmol/L (ref 98–110)
Creat: 0.93 mg/dL — ABNORMAL HIGH (ref 0.60–0.88)
GLUCOSE: 163 mg/dL — AB (ref 65–139)
POTASSIUM: 3.7 mmol/L (ref 3.5–5.3)
SODIUM: 140 mmol/L (ref 135–146)

## 2016-12-04 LAB — TSH: TSH: 0.49 m[IU]/L (ref 0.40–4.50)

## 2016-12-04 NOTE — Addendum Note (Signed)
Addended by: Kerney ElbePINNIX, Talasia Saulter G on: 12/04/2016 11:38 AM   Modules accepted: Orders

## 2016-12-04 NOTE — Patient Instructions (Signed)
Medication Instructions:  Your physician recommends that you continue on your current medications as directed. Please refer to the Current Medication list given to you today.   Labwork: Your physician recommends that you return for lab work in: Today    Testing/Procedures: NONE   Follow-Up: Your physician wants you to follow-up in: 1 Year with Dr. Dwyane LuoKoneswarn. You will receive a reminder letter in the mail two months in advance. If you don't receive a letter, please call our office to schedule the follow-up appointment.   Any Other Special Instructions Will Be Listed Below (If Applicable).     If you need a refill on your cardiac medications before your next appointment, please call your pharmacy.

## 2016-12-04 NOTE — Progress Notes (Signed)
SUBJECTIVE: The patient presents for follow-up of chest pain and hypertension.  I initially evaluated her while hospitalized on 05/15/16.  I initiated amlodipine for both blood pressure control and anginal relief. She did not want stress testing as she would not agree to coronary angiography and preferred medical management.  She denies chest pain.  She has some shortness of breath but cannot say whether it has gotten any worse.  She denies orthopnea, leg swelling, and paroxysmal nocturnal dyspnea.  Primary complaints relate to generalized weakness.  She has no specific leg or arm weakness.  Potassium was 3.4 when hospitalized in April.  She is on low-dose hydrochlorothiazide.      Review of Systems: As per "subjective", otherwise negative.  Allergies  Allergen Reactions  . Hydrocodone-Acetaminophen Nausea And Vomiting    Current Outpatient Prescriptions  Medication Sig Dispense Refill  . acetaminophen (TYLENOL) 500 MG tablet Take 1,000 mg by mouth 3 (three) times daily. For pain    . amLODipine (NORVASC) 5 MG tablet Take 1 tablet (5 mg total) by mouth at bedtime. 30 tablet 0  . dextromethorphan-guaiFENesin (TUSSIN DM) 10-100 MG/5ML liquid Take 10 mLs by mouth every 6 (six) hours as needed for cough.    . donepezil (ARICEPT) 5 MG tablet Take 1 tablet (5 mg total) by mouth at bedtime. 30 tablet 12  . hydrochlorothiazide (MICROZIDE) 12.5 MG capsule Take 12.5 mg by mouth every morning.    . loperamide (IMODIUM) 2 MG capsule Take 2 mg by mouth every 4 (four) hours as needed for diarrhea or loose stools.    . valsartan (DIOVAN) 320 MG tablet Take 320 mg by mouth at bedtime.      No current facility-administered medications for this visit.     Past Medical History:  Diagnosis Date  . Anxiety   . Dementia   . GERD (gastroesophageal reflux disease)   . Goiter   . Hypertension   . Osteoarthritis   . Osteoporosis   . Vitamin D insufficiency     Past Surgical History:    Procedure Laterality Date  . APPENDECTOMY    . KIDNEY STONE SURGERY    . ORIF HIP FRACTURE Right 11/20/2012   Procedure: OPEN REDUCTION INTERNAL FIXATION RIGHT HIP;  Surgeon: Darreld Mclean, MD;  Location: [facility];  Service: Orthopedics;  Laterality: Right;  . stress fractures of legs    . TUBAL LIGATION      Social History   Social History  . Marital status: Widowed    Spouse name: N/A  . Number of children: N/A  . Years of education: N/A   Occupational History  . Not on file.   Social History Main Topics  . Smoking status: Never Smoker  . Smokeless tobacco: Never Used  . Alcohol use No  . Drug use: No  . Sexual activity: No   Other Topics Concern  . Not on file   Social History Narrative  . No narrative on file     Vitals:   12/04/16 1023  BP: 130/68  Pulse: 78  SpO2: 95%  Weight: 143 lb (64.9 kg)  Height: 5' 5.5" (1.664 m)    Wt Readings from Last 3 Encounters:  12/04/16 143 lb (64.9 kg)  06/03/16 140 lb (63.5 kg)  05/14/16 139 lb 14.4 oz (63.5 kg)     PHYSICAL EXAM General: NAD HEENT: Normal. Neck: No JVD, no thyromegaly. Lungs: Clear to auscultation bilaterally with normal respiratory effort. CV: Regular rate and rhythm, normal  S1/S2, no S3/S4, no murmur. No pretibial or periankle edema. Abdomen: Soft, nontender, no distention.  Neurologic: Alert and oriented.  Psych: Normal affect. Skin: Normal. Musculoskeletal: No gross deformities.    ECG: Most recent ECG reviewed.   Labs:    Lipids: No results found for: LDLCALC, LDLDIRECT, CHOL, TRIG, HDL     ASSESSMENT AND PLAN:  1.  Generalized weakness: I will check a TSH to evaluate for hypothyroidism.  I will check a CBC to evaluate for anemia or infection.  I will also check a basic metabolic panel to assess for hypokalemia or hyponatremia.  This will also assess renal function.  2.  Chest pain: Symptomatically stable.  Continue amlodipine for both blood pressure control and anginal  relief.  3.  Hypertension: Controlled on valsartan 320 mg, hydrochlorothiazide 12.5 mg, and amlodipine 5 mill grams.  No changes to therapy.      Disposition: Follow up 1 year.   Prentice DockerSuresh Kenyette Gundy, M.D., F.A.C.C.

## 2016-12-19 DIAGNOSIS — H25813 Combined forms of age-related cataract, bilateral: Secondary | ICD-10-CM | POA: Diagnosis not present

## 2017-01-06 DIAGNOSIS — B351 Tinea unguium: Secondary | ICD-10-CM | POA: Diagnosis not present

## 2017-01-06 DIAGNOSIS — M79675 Pain in left toe(s): Secondary | ICD-10-CM | POA: Diagnosis not present

## 2017-01-06 DIAGNOSIS — M79674 Pain in right toe(s): Secondary | ICD-10-CM | POA: Diagnosis not present

## 2017-01-07 DIAGNOSIS — R260 Ataxic gait: Secondary | ICD-10-CM | POA: Diagnosis not present

## 2017-01-07 DIAGNOSIS — F0391 Unspecified dementia with behavioral disturbance: Secondary | ICD-10-CM | POA: Diagnosis not present

## 2017-01-07 DIAGNOSIS — M810 Age-related osteoporosis without current pathological fracture: Secondary | ICD-10-CM | POA: Diagnosis not present

## 2017-01-07 DIAGNOSIS — M6281 Muscle weakness (generalized): Secondary | ICD-10-CM | POA: Diagnosis not present

## 2017-01-07 DIAGNOSIS — F419 Anxiety disorder, unspecified: Secondary | ICD-10-CM | POA: Diagnosis not present

## 2017-01-07 DIAGNOSIS — I10 Essential (primary) hypertension: Secondary | ICD-10-CM | POA: Diagnosis not present

## 2017-01-08 DIAGNOSIS — I10 Essential (primary) hypertension: Secondary | ICD-10-CM | POA: Diagnosis not present

## 2017-01-08 DIAGNOSIS — F0391 Unspecified dementia with behavioral disturbance: Secondary | ICD-10-CM | POA: Diagnosis not present

## 2017-01-08 DIAGNOSIS — F419 Anxiety disorder, unspecified: Secondary | ICD-10-CM | POA: Diagnosis not present

## 2017-01-08 DIAGNOSIS — M6281 Muscle weakness (generalized): Secondary | ICD-10-CM | POA: Diagnosis not present

## 2017-01-08 DIAGNOSIS — R260 Ataxic gait: Secondary | ICD-10-CM | POA: Diagnosis not present

## 2017-01-08 DIAGNOSIS — M810 Age-related osteoporosis without current pathological fracture: Secondary | ICD-10-CM | POA: Diagnosis not present

## 2017-01-09 DIAGNOSIS — M810 Age-related osteoporosis without current pathological fracture: Secondary | ICD-10-CM | POA: Diagnosis not present

## 2017-01-09 DIAGNOSIS — R260 Ataxic gait: Secondary | ICD-10-CM | POA: Diagnosis not present

## 2017-01-09 DIAGNOSIS — F0391 Unspecified dementia with behavioral disturbance: Secondary | ICD-10-CM | POA: Diagnosis not present

## 2017-01-09 DIAGNOSIS — F419 Anxiety disorder, unspecified: Secondary | ICD-10-CM | POA: Diagnosis not present

## 2017-01-09 DIAGNOSIS — M6281 Muscle weakness (generalized): Secondary | ICD-10-CM | POA: Diagnosis not present

## 2017-01-09 DIAGNOSIS — I10 Essential (primary) hypertension: Secondary | ICD-10-CM | POA: Diagnosis not present

## 2017-01-15 DIAGNOSIS — R260 Ataxic gait: Secondary | ICD-10-CM | POA: Diagnosis not present

## 2017-01-15 DIAGNOSIS — F0391 Unspecified dementia with behavioral disturbance: Secondary | ICD-10-CM | POA: Diagnosis not present

## 2017-01-15 DIAGNOSIS — I10 Essential (primary) hypertension: Secondary | ICD-10-CM | POA: Diagnosis not present

## 2017-01-15 DIAGNOSIS — M6281 Muscle weakness (generalized): Secondary | ICD-10-CM | POA: Diagnosis not present

## 2017-01-15 DIAGNOSIS — F419 Anxiety disorder, unspecified: Secondary | ICD-10-CM | POA: Diagnosis not present

## 2017-01-15 DIAGNOSIS — M810 Age-related osteoporosis without current pathological fracture: Secondary | ICD-10-CM | POA: Diagnosis not present

## 2017-01-16 DIAGNOSIS — I10 Essential (primary) hypertension: Secondary | ICD-10-CM | POA: Diagnosis not present

## 2017-01-16 DIAGNOSIS — M6281 Muscle weakness (generalized): Secondary | ICD-10-CM | POA: Diagnosis not present

## 2017-01-16 DIAGNOSIS — F0391 Unspecified dementia with behavioral disturbance: Secondary | ICD-10-CM | POA: Diagnosis not present

## 2017-01-16 DIAGNOSIS — R260 Ataxic gait: Secondary | ICD-10-CM | POA: Diagnosis not present

## 2017-01-16 DIAGNOSIS — M810 Age-related osteoporosis without current pathological fracture: Secondary | ICD-10-CM | POA: Diagnosis not present

## 2017-01-16 DIAGNOSIS — F419 Anxiety disorder, unspecified: Secondary | ICD-10-CM | POA: Diagnosis not present

## 2017-01-17 DIAGNOSIS — F0391 Unspecified dementia with behavioral disturbance: Secondary | ICD-10-CM | POA: Diagnosis not present

## 2017-01-17 DIAGNOSIS — M810 Age-related osteoporosis without current pathological fracture: Secondary | ICD-10-CM | POA: Diagnosis not present

## 2017-01-17 DIAGNOSIS — R260 Ataxic gait: Secondary | ICD-10-CM | POA: Diagnosis not present

## 2017-01-17 DIAGNOSIS — M6281 Muscle weakness (generalized): Secondary | ICD-10-CM | POA: Diagnosis not present

## 2017-01-17 DIAGNOSIS — F419 Anxiety disorder, unspecified: Secondary | ICD-10-CM | POA: Diagnosis not present

## 2017-01-17 DIAGNOSIS — I10 Essential (primary) hypertension: Secondary | ICD-10-CM | POA: Diagnosis not present

## 2017-01-20 DIAGNOSIS — F419 Anxiety disorder, unspecified: Secondary | ICD-10-CM | POA: Diagnosis not present

## 2017-01-20 DIAGNOSIS — R260 Ataxic gait: Secondary | ICD-10-CM | POA: Diagnosis not present

## 2017-01-20 DIAGNOSIS — I10 Essential (primary) hypertension: Secondary | ICD-10-CM | POA: Diagnosis not present

## 2017-01-20 DIAGNOSIS — M6281 Muscle weakness (generalized): Secondary | ICD-10-CM | POA: Diagnosis not present

## 2017-01-20 DIAGNOSIS — F0391 Unspecified dementia with behavioral disturbance: Secondary | ICD-10-CM | POA: Diagnosis not present

## 2017-01-20 DIAGNOSIS — M810 Age-related osteoporosis without current pathological fracture: Secondary | ICD-10-CM | POA: Diagnosis not present

## 2017-01-22 DIAGNOSIS — F419 Anxiety disorder, unspecified: Secondary | ICD-10-CM | POA: Diagnosis not present

## 2017-01-22 DIAGNOSIS — R260 Ataxic gait: Secondary | ICD-10-CM | POA: Diagnosis not present

## 2017-01-22 DIAGNOSIS — M810 Age-related osteoporosis without current pathological fracture: Secondary | ICD-10-CM | POA: Diagnosis not present

## 2017-01-22 DIAGNOSIS — F0391 Unspecified dementia with behavioral disturbance: Secondary | ICD-10-CM | POA: Diagnosis not present

## 2017-01-22 DIAGNOSIS — M6281 Muscle weakness (generalized): Secondary | ICD-10-CM | POA: Diagnosis not present

## 2017-01-22 DIAGNOSIS — I10 Essential (primary) hypertension: Secondary | ICD-10-CM | POA: Diagnosis not present

## 2017-01-29 DIAGNOSIS — F419 Anxiety disorder, unspecified: Secondary | ICD-10-CM | POA: Diagnosis not present

## 2017-01-29 DIAGNOSIS — R260 Ataxic gait: Secondary | ICD-10-CM | POA: Diagnosis not present

## 2017-01-29 DIAGNOSIS — I10 Essential (primary) hypertension: Secondary | ICD-10-CM | POA: Diagnosis not present

## 2017-01-29 DIAGNOSIS — M6281 Muscle weakness (generalized): Secondary | ICD-10-CM | POA: Diagnosis not present

## 2017-01-29 DIAGNOSIS — F0391 Unspecified dementia with behavioral disturbance: Secondary | ICD-10-CM | POA: Diagnosis not present

## 2017-01-29 DIAGNOSIS — M810 Age-related osteoporosis without current pathological fracture: Secondary | ICD-10-CM | POA: Diagnosis not present

## 2017-01-30 DIAGNOSIS — F419 Anxiety disorder, unspecified: Secondary | ICD-10-CM | POA: Diagnosis not present

## 2017-01-30 DIAGNOSIS — F0391 Unspecified dementia with behavioral disturbance: Secondary | ICD-10-CM | POA: Diagnosis not present

## 2017-01-30 DIAGNOSIS — M6281 Muscle weakness (generalized): Secondary | ICD-10-CM | POA: Diagnosis not present

## 2017-01-30 DIAGNOSIS — R260 Ataxic gait: Secondary | ICD-10-CM | POA: Diagnosis not present

## 2017-01-30 DIAGNOSIS — I10 Essential (primary) hypertension: Secondary | ICD-10-CM | POA: Diagnosis not present

## 2017-01-30 DIAGNOSIS — M810 Age-related osteoporosis without current pathological fracture: Secondary | ICD-10-CM | POA: Diagnosis not present

## 2017-02-05 DIAGNOSIS — F419 Anxiety disorder, unspecified: Secondary | ICD-10-CM | POA: Diagnosis not present

## 2017-02-05 DIAGNOSIS — R260 Ataxic gait: Secondary | ICD-10-CM | POA: Diagnosis not present

## 2017-02-05 DIAGNOSIS — F0391 Unspecified dementia with behavioral disturbance: Secondary | ICD-10-CM | POA: Diagnosis not present

## 2017-02-05 DIAGNOSIS — M6281 Muscle weakness (generalized): Secondary | ICD-10-CM | POA: Diagnosis not present

## 2017-02-05 DIAGNOSIS — M810 Age-related osteoporosis without current pathological fracture: Secondary | ICD-10-CM | POA: Diagnosis not present

## 2017-02-05 DIAGNOSIS — I10 Essential (primary) hypertension: Secondary | ICD-10-CM | POA: Diagnosis not present

## 2017-02-06 DIAGNOSIS — F419 Anxiety disorder, unspecified: Secondary | ICD-10-CM | POA: Diagnosis not present

## 2017-02-06 DIAGNOSIS — M810 Age-related osteoporosis without current pathological fracture: Secondary | ICD-10-CM | POA: Diagnosis not present

## 2017-02-06 DIAGNOSIS — M6281 Muscle weakness (generalized): Secondary | ICD-10-CM | POA: Diagnosis not present

## 2017-02-06 DIAGNOSIS — F0391 Unspecified dementia with behavioral disturbance: Secondary | ICD-10-CM | POA: Diagnosis not present

## 2017-02-06 DIAGNOSIS — I10 Essential (primary) hypertension: Secondary | ICD-10-CM | POA: Diagnosis not present

## 2017-02-06 DIAGNOSIS — R260 Ataxic gait: Secondary | ICD-10-CM | POA: Diagnosis not present

## 2017-02-12 DIAGNOSIS — I10 Essential (primary) hypertension: Secondary | ICD-10-CM | POA: Diagnosis not present

## 2017-02-12 DIAGNOSIS — M6281 Muscle weakness (generalized): Secondary | ICD-10-CM | POA: Diagnosis not present

## 2017-02-12 DIAGNOSIS — R260 Ataxic gait: Secondary | ICD-10-CM | POA: Diagnosis not present

## 2017-02-12 DIAGNOSIS — F0391 Unspecified dementia with behavioral disturbance: Secondary | ICD-10-CM | POA: Diagnosis not present

## 2017-02-12 DIAGNOSIS — F419 Anxiety disorder, unspecified: Secondary | ICD-10-CM | POA: Diagnosis not present

## 2017-02-12 DIAGNOSIS — M810 Age-related osteoporosis without current pathological fracture: Secondary | ICD-10-CM | POA: Diagnosis not present

## 2017-02-14 DIAGNOSIS — R260 Ataxic gait: Secondary | ICD-10-CM | POA: Diagnosis not present

## 2017-02-14 DIAGNOSIS — M810 Age-related osteoporosis without current pathological fracture: Secondary | ICD-10-CM | POA: Diagnosis not present

## 2017-02-14 DIAGNOSIS — M6281 Muscle weakness (generalized): Secondary | ICD-10-CM | POA: Diagnosis not present

## 2017-02-14 DIAGNOSIS — F419 Anxiety disorder, unspecified: Secondary | ICD-10-CM | POA: Diagnosis not present

## 2017-02-14 DIAGNOSIS — I10 Essential (primary) hypertension: Secondary | ICD-10-CM | POA: Diagnosis not present

## 2017-02-14 DIAGNOSIS — F0391 Unspecified dementia with behavioral disturbance: Secondary | ICD-10-CM | POA: Diagnosis not present

## 2017-02-20 DIAGNOSIS — M810 Age-related osteoporosis without current pathological fracture: Secondary | ICD-10-CM | POA: Diagnosis not present

## 2017-02-20 DIAGNOSIS — R260 Ataxic gait: Secondary | ICD-10-CM | POA: Diagnosis not present

## 2017-02-20 DIAGNOSIS — I10 Essential (primary) hypertension: Secondary | ICD-10-CM | POA: Diagnosis not present

## 2017-02-20 DIAGNOSIS — F0391 Unspecified dementia with behavioral disturbance: Secondary | ICD-10-CM | POA: Diagnosis not present

## 2017-02-20 DIAGNOSIS — F419 Anxiety disorder, unspecified: Secondary | ICD-10-CM | POA: Diagnosis not present

## 2017-02-20 DIAGNOSIS — M6281 Muscle weakness (generalized): Secondary | ICD-10-CM | POA: Diagnosis not present

## 2017-02-24 DIAGNOSIS — F0391 Unspecified dementia with behavioral disturbance: Secondary | ICD-10-CM | POA: Diagnosis not present

## 2017-02-24 DIAGNOSIS — I10 Essential (primary) hypertension: Secondary | ICD-10-CM | POA: Diagnosis not present

## 2017-02-24 DIAGNOSIS — M6281 Muscle weakness (generalized): Secondary | ICD-10-CM | POA: Diagnosis not present

## 2017-02-24 DIAGNOSIS — F419 Anxiety disorder, unspecified: Secondary | ICD-10-CM | POA: Diagnosis not present

## 2017-02-24 DIAGNOSIS — R260 Ataxic gait: Secondary | ICD-10-CM | POA: Diagnosis not present

## 2017-02-24 DIAGNOSIS — M810 Age-related osteoporosis without current pathological fracture: Secondary | ICD-10-CM | POA: Diagnosis not present

## 2017-02-28 DIAGNOSIS — F419 Anxiety disorder, unspecified: Secondary | ICD-10-CM | POA: Diagnosis not present

## 2017-02-28 DIAGNOSIS — R260 Ataxic gait: Secondary | ICD-10-CM | POA: Diagnosis not present

## 2017-02-28 DIAGNOSIS — M810 Age-related osteoporosis without current pathological fracture: Secondary | ICD-10-CM | POA: Diagnosis not present

## 2017-02-28 DIAGNOSIS — F0391 Unspecified dementia with behavioral disturbance: Secondary | ICD-10-CM | POA: Diagnosis not present

## 2017-02-28 DIAGNOSIS — M6281 Muscle weakness (generalized): Secondary | ICD-10-CM | POA: Diagnosis not present

## 2017-02-28 DIAGNOSIS — I10 Essential (primary) hypertension: Secondary | ICD-10-CM | POA: Diagnosis not present

## 2017-03-06 DIAGNOSIS — R260 Ataxic gait: Secondary | ICD-10-CM | POA: Diagnosis not present

## 2017-03-06 DIAGNOSIS — F419 Anxiety disorder, unspecified: Secondary | ICD-10-CM | POA: Diagnosis not present

## 2017-03-06 DIAGNOSIS — M810 Age-related osteoporosis without current pathological fracture: Secondary | ICD-10-CM | POA: Diagnosis not present

## 2017-03-06 DIAGNOSIS — F0391 Unspecified dementia with behavioral disturbance: Secondary | ICD-10-CM | POA: Diagnosis not present

## 2017-03-06 DIAGNOSIS — M6281 Muscle weakness (generalized): Secondary | ICD-10-CM | POA: Diagnosis not present

## 2017-03-06 DIAGNOSIS — I10 Essential (primary) hypertension: Secondary | ICD-10-CM | POA: Diagnosis not present

## 2017-03-07 DIAGNOSIS — M810 Age-related osteoporosis without current pathological fracture: Secondary | ICD-10-CM | POA: Diagnosis not present

## 2017-03-07 DIAGNOSIS — M6281 Muscle weakness (generalized): Secondary | ICD-10-CM | POA: Diagnosis not present

## 2017-03-07 DIAGNOSIS — I10 Essential (primary) hypertension: Secondary | ICD-10-CM | POA: Diagnosis not present

## 2017-03-07 DIAGNOSIS — F419 Anxiety disorder, unspecified: Secondary | ICD-10-CM | POA: Diagnosis not present

## 2017-03-07 DIAGNOSIS — F0391 Unspecified dementia with behavioral disturbance: Secondary | ICD-10-CM | POA: Diagnosis not present

## 2017-03-07 DIAGNOSIS — R260 Ataxic gait: Secondary | ICD-10-CM | POA: Diagnosis not present

## 2017-03-08 DIAGNOSIS — F419 Anxiety disorder, unspecified: Secondary | ICD-10-CM | POA: Diagnosis not present

## 2017-03-08 DIAGNOSIS — F0391 Unspecified dementia with behavioral disturbance: Secondary | ICD-10-CM | POA: Diagnosis not present

## 2017-03-08 DIAGNOSIS — R260 Ataxic gait: Secondary | ICD-10-CM | POA: Diagnosis not present

## 2017-03-08 DIAGNOSIS — I10 Essential (primary) hypertension: Secondary | ICD-10-CM | POA: Diagnosis not present

## 2017-03-08 DIAGNOSIS — M810 Age-related osteoporosis without current pathological fracture: Secondary | ICD-10-CM | POA: Diagnosis not present

## 2017-03-08 DIAGNOSIS — M6281 Muscle weakness (generalized): Secondary | ICD-10-CM | POA: Diagnosis not present

## 2017-03-10 DIAGNOSIS — R260 Ataxic gait: Secondary | ICD-10-CM | POA: Diagnosis not present

## 2017-03-10 DIAGNOSIS — F419 Anxiety disorder, unspecified: Secondary | ICD-10-CM | POA: Diagnosis not present

## 2017-03-10 DIAGNOSIS — F0391 Unspecified dementia with behavioral disturbance: Secondary | ICD-10-CM | POA: Diagnosis not present

## 2017-03-10 DIAGNOSIS — I10 Essential (primary) hypertension: Secondary | ICD-10-CM | POA: Diagnosis not present

## 2017-03-10 DIAGNOSIS — M6281 Muscle weakness (generalized): Secondary | ICD-10-CM | POA: Diagnosis not present

## 2017-03-10 DIAGNOSIS — M810 Age-related osteoporosis without current pathological fracture: Secondary | ICD-10-CM | POA: Diagnosis not present

## 2017-03-14 DIAGNOSIS — I10 Essential (primary) hypertension: Secondary | ICD-10-CM | POA: Diagnosis not present

## 2017-03-14 DIAGNOSIS — F419 Anxiety disorder, unspecified: Secondary | ICD-10-CM | POA: Diagnosis not present

## 2017-03-14 DIAGNOSIS — F0391 Unspecified dementia with behavioral disturbance: Secondary | ICD-10-CM | POA: Diagnosis not present

## 2017-03-14 DIAGNOSIS — M6281 Muscle weakness (generalized): Secondary | ICD-10-CM | POA: Diagnosis not present

## 2017-03-14 DIAGNOSIS — R260 Ataxic gait: Secondary | ICD-10-CM | POA: Diagnosis not present

## 2017-03-14 DIAGNOSIS — M810 Age-related osteoporosis without current pathological fracture: Secondary | ICD-10-CM | POA: Diagnosis not present

## 2017-03-18 DIAGNOSIS — F419 Anxiety disorder, unspecified: Secondary | ICD-10-CM | POA: Diagnosis not present

## 2017-03-18 DIAGNOSIS — F0391 Unspecified dementia with behavioral disturbance: Secondary | ICD-10-CM | POA: Diagnosis not present

## 2017-03-18 DIAGNOSIS — M810 Age-related osteoporosis without current pathological fracture: Secondary | ICD-10-CM | POA: Diagnosis not present

## 2017-03-18 DIAGNOSIS — R260 Ataxic gait: Secondary | ICD-10-CM | POA: Diagnosis not present

## 2017-03-18 DIAGNOSIS — I10 Essential (primary) hypertension: Secondary | ICD-10-CM | POA: Diagnosis not present

## 2017-03-18 DIAGNOSIS — M6281 Muscle weakness (generalized): Secondary | ICD-10-CM | POA: Diagnosis not present

## 2017-03-19 DIAGNOSIS — R260 Ataxic gait: Secondary | ICD-10-CM | POA: Diagnosis not present

## 2017-03-19 DIAGNOSIS — M810 Age-related osteoporosis without current pathological fracture: Secondary | ICD-10-CM | POA: Diagnosis not present

## 2017-03-19 DIAGNOSIS — M6281 Muscle weakness (generalized): Secondary | ICD-10-CM | POA: Diagnosis not present

## 2017-03-19 DIAGNOSIS — F0391 Unspecified dementia with behavioral disturbance: Secondary | ICD-10-CM | POA: Diagnosis not present

## 2017-03-19 DIAGNOSIS — F419 Anxiety disorder, unspecified: Secondary | ICD-10-CM | POA: Diagnosis not present

## 2017-03-19 DIAGNOSIS — I10 Essential (primary) hypertension: Secondary | ICD-10-CM | POA: Diagnosis not present

## 2017-03-25 DIAGNOSIS — M810 Age-related osteoporosis without current pathological fracture: Secondary | ICD-10-CM | POA: Diagnosis not present

## 2017-03-25 DIAGNOSIS — M6281 Muscle weakness (generalized): Secondary | ICD-10-CM | POA: Diagnosis not present

## 2017-03-25 DIAGNOSIS — R260 Ataxic gait: Secondary | ICD-10-CM | POA: Diagnosis not present

## 2017-03-25 DIAGNOSIS — F419 Anxiety disorder, unspecified: Secondary | ICD-10-CM | POA: Diagnosis not present

## 2017-03-25 DIAGNOSIS — B351 Tinea unguium: Secondary | ICD-10-CM | POA: Diagnosis not present

## 2017-03-25 DIAGNOSIS — I10 Essential (primary) hypertension: Secondary | ICD-10-CM | POA: Diagnosis not present

## 2017-03-25 DIAGNOSIS — M79674 Pain in right toe(s): Secondary | ICD-10-CM | POA: Diagnosis not present

## 2017-03-25 DIAGNOSIS — F0391 Unspecified dementia with behavioral disturbance: Secondary | ICD-10-CM | POA: Diagnosis not present

## 2017-03-27 DIAGNOSIS — I10 Essential (primary) hypertension: Secondary | ICD-10-CM | POA: Diagnosis not present

## 2017-03-27 DIAGNOSIS — M6281 Muscle weakness (generalized): Secondary | ICD-10-CM | POA: Diagnosis not present

## 2017-03-27 DIAGNOSIS — F0391 Unspecified dementia with behavioral disturbance: Secondary | ICD-10-CM | POA: Diagnosis not present

## 2017-03-27 DIAGNOSIS — M810 Age-related osteoporosis without current pathological fracture: Secondary | ICD-10-CM | POA: Diagnosis not present

## 2017-03-27 DIAGNOSIS — R260 Ataxic gait: Secondary | ICD-10-CM | POA: Diagnosis not present

## 2017-03-27 DIAGNOSIS — F419 Anxiety disorder, unspecified: Secondary | ICD-10-CM | POA: Diagnosis not present

## 2017-04-01 DIAGNOSIS — I10 Essential (primary) hypertension: Secondary | ICD-10-CM | POA: Diagnosis not present

## 2017-04-01 DIAGNOSIS — F0391 Unspecified dementia with behavioral disturbance: Secondary | ICD-10-CM | POA: Diagnosis not present

## 2017-04-01 DIAGNOSIS — R260 Ataxic gait: Secondary | ICD-10-CM | POA: Diagnosis not present

## 2017-04-01 DIAGNOSIS — M810 Age-related osteoporosis without current pathological fracture: Secondary | ICD-10-CM | POA: Diagnosis not present

## 2017-04-01 DIAGNOSIS — F419 Anxiety disorder, unspecified: Secondary | ICD-10-CM | POA: Diagnosis not present

## 2017-04-01 DIAGNOSIS — M6281 Muscle weakness (generalized): Secondary | ICD-10-CM | POA: Diagnosis not present

## 2017-04-10 DIAGNOSIS — F419 Anxiety disorder, unspecified: Secondary | ICD-10-CM | POA: Diagnosis not present

## 2017-04-10 DIAGNOSIS — R260 Ataxic gait: Secondary | ICD-10-CM | POA: Diagnosis not present

## 2017-04-10 DIAGNOSIS — I10 Essential (primary) hypertension: Secondary | ICD-10-CM | POA: Diagnosis not present

## 2017-04-10 DIAGNOSIS — M6281 Muscle weakness (generalized): Secondary | ICD-10-CM | POA: Diagnosis not present

## 2017-04-10 DIAGNOSIS — M810 Age-related osteoporosis without current pathological fracture: Secondary | ICD-10-CM | POA: Diagnosis not present

## 2017-04-10 DIAGNOSIS — F0391 Unspecified dementia with behavioral disturbance: Secondary | ICD-10-CM | POA: Diagnosis not present

## 2017-04-14 DIAGNOSIS — M810 Age-related osteoporosis without current pathological fracture: Secondary | ICD-10-CM | POA: Diagnosis not present

## 2017-04-14 DIAGNOSIS — R260 Ataxic gait: Secondary | ICD-10-CM | POA: Diagnosis not present

## 2017-04-14 DIAGNOSIS — F0391 Unspecified dementia with behavioral disturbance: Secondary | ICD-10-CM | POA: Diagnosis not present

## 2017-04-14 DIAGNOSIS — F419 Anxiety disorder, unspecified: Secondary | ICD-10-CM | POA: Diagnosis not present

## 2017-04-14 DIAGNOSIS — M6281 Muscle weakness (generalized): Secondary | ICD-10-CM | POA: Diagnosis not present

## 2017-04-14 DIAGNOSIS — I10 Essential (primary) hypertension: Secondary | ICD-10-CM | POA: Diagnosis not present

## 2017-05-27 DIAGNOSIS — M79675 Pain in left toe(s): Secondary | ICD-10-CM | POA: Diagnosis not present

## 2017-05-27 DIAGNOSIS — B351 Tinea unguium: Secondary | ICD-10-CM | POA: Diagnosis not present

## 2017-05-27 DIAGNOSIS — M79674 Pain in right toe(s): Secondary | ICD-10-CM | POA: Diagnosis not present

## 2017-07-11 DIAGNOSIS — H5203 Hypermetropia, bilateral: Secondary | ICD-10-CM | POA: Diagnosis not present

## 2017-07-11 DIAGNOSIS — H524 Presbyopia: Secondary | ICD-10-CM | POA: Diagnosis not present

## 2017-07-18 ENCOUNTER — Ambulatory Visit (HOSPITAL_COMMUNITY)
Admission: RE | Admit: 2017-07-18 | Discharge: 2017-07-18 | Disposition: A | Discharge status: Home / Self Care | Payer: Medicare Other | Source: Ambulatory Visit | Attending: Internal Medicine | Admitting: Internal Medicine

## 2017-07-18 ENCOUNTER — Other Ambulatory Visit (HOSPITAL_COMMUNITY): Payer: Self-pay | Admitting: Internal Medicine

## 2017-07-18 DIAGNOSIS — R52 Pain, unspecified: Secondary | ICD-10-CM

## 2017-07-18 DIAGNOSIS — M810 Age-related osteoporosis without current pathological fracture: Secondary | ICD-10-CM | POA: Diagnosis not present

## 2017-07-18 DIAGNOSIS — M25552 Pain in left hip: Secondary | ICD-10-CM | POA: Insufficient documentation

## 2017-07-18 DIAGNOSIS — M160 Bilateral primary osteoarthritis of hip: Secondary | ICD-10-CM | POA: Diagnosis not present

## 2017-07-29 DIAGNOSIS — M79674 Pain in right toe(s): Secondary | ICD-10-CM | POA: Diagnosis not present

## 2017-07-29 DIAGNOSIS — M79675 Pain in left toe(s): Secondary | ICD-10-CM | POA: Diagnosis not present

## 2017-07-29 DIAGNOSIS — B351 Tinea unguium: Secondary | ICD-10-CM | POA: Diagnosis not present

## 2017-08-30 DIAGNOSIS — I10 Essential (primary) hypertension: Secondary | ICD-10-CM | POA: Diagnosis not present

## 2017-08-30 DIAGNOSIS — F0390 Unspecified dementia without behavioral disturbance: Secondary | ICD-10-CM | POA: Diagnosis not present

## 2017-08-30 DIAGNOSIS — M1990 Unspecified osteoarthritis, unspecified site: Secondary | ICD-10-CM | POA: Diagnosis not present

## 2017-08-30 DIAGNOSIS — M6281 Muscle weakness (generalized): Secondary | ICD-10-CM | POA: Diagnosis not present

## 2017-08-30 DIAGNOSIS — R296 Repeated falls: Secondary | ICD-10-CM | POA: Diagnosis not present

## 2017-08-30 DIAGNOSIS — R2689 Other abnormalities of gait and mobility: Secondary | ICD-10-CM | POA: Diagnosis not present

## 2017-08-30 DIAGNOSIS — F419 Anxiety disorder, unspecified: Secondary | ICD-10-CM | POA: Diagnosis not present

## 2017-09-01 DIAGNOSIS — F419 Anxiety disorder, unspecified: Secondary | ICD-10-CM | POA: Diagnosis not present

## 2017-09-01 DIAGNOSIS — I10 Essential (primary) hypertension: Secondary | ICD-10-CM | POA: Diagnosis not present

## 2017-09-01 DIAGNOSIS — M1990 Unspecified osteoarthritis, unspecified site: Secondary | ICD-10-CM | POA: Diagnosis not present

## 2017-09-01 DIAGNOSIS — F0390 Unspecified dementia without behavioral disturbance: Secondary | ICD-10-CM | POA: Diagnosis not present

## 2017-09-01 DIAGNOSIS — M6281 Muscle weakness (generalized): Secondary | ICD-10-CM | POA: Diagnosis not present

## 2017-09-01 DIAGNOSIS — R296 Repeated falls: Secondary | ICD-10-CM | POA: Diagnosis not present

## 2017-09-02 DIAGNOSIS — I10 Essential (primary) hypertension: Secondary | ICD-10-CM | POA: Diagnosis not present

## 2017-09-02 DIAGNOSIS — R296 Repeated falls: Secondary | ICD-10-CM | POA: Diagnosis not present

## 2017-09-02 DIAGNOSIS — M6281 Muscle weakness (generalized): Secondary | ICD-10-CM | POA: Diagnosis not present

## 2017-09-02 DIAGNOSIS — M1990 Unspecified osteoarthritis, unspecified site: Secondary | ICD-10-CM | POA: Diagnosis not present

## 2017-09-02 DIAGNOSIS — F419 Anxiety disorder, unspecified: Secondary | ICD-10-CM | POA: Diagnosis not present

## 2017-09-02 DIAGNOSIS — F0390 Unspecified dementia without behavioral disturbance: Secondary | ICD-10-CM | POA: Diagnosis not present

## 2017-09-03 DIAGNOSIS — M1990 Unspecified osteoarthritis, unspecified site: Secondary | ICD-10-CM | POA: Diagnosis not present

## 2017-09-03 DIAGNOSIS — R296 Repeated falls: Secondary | ICD-10-CM | POA: Diagnosis not present

## 2017-09-03 DIAGNOSIS — F419 Anxiety disorder, unspecified: Secondary | ICD-10-CM | POA: Diagnosis not present

## 2017-09-03 DIAGNOSIS — M6281 Muscle weakness (generalized): Secondary | ICD-10-CM | POA: Diagnosis not present

## 2017-09-03 DIAGNOSIS — F0390 Unspecified dementia without behavioral disturbance: Secondary | ICD-10-CM | POA: Diagnosis not present

## 2017-09-03 DIAGNOSIS — I10 Essential (primary) hypertension: Secondary | ICD-10-CM | POA: Diagnosis not present

## 2017-09-04 DIAGNOSIS — R296 Repeated falls: Secondary | ICD-10-CM | POA: Diagnosis not present

## 2017-09-04 DIAGNOSIS — M1990 Unspecified osteoarthritis, unspecified site: Secondary | ICD-10-CM | POA: Diagnosis not present

## 2017-09-04 DIAGNOSIS — M6281 Muscle weakness (generalized): Secondary | ICD-10-CM | POA: Diagnosis not present

## 2017-09-04 DIAGNOSIS — I10 Essential (primary) hypertension: Secondary | ICD-10-CM | POA: Diagnosis not present

## 2017-09-04 DIAGNOSIS — F419 Anxiety disorder, unspecified: Secondary | ICD-10-CM | POA: Diagnosis not present

## 2017-09-04 DIAGNOSIS — F0390 Unspecified dementia without behavioral disturbance: Secondary | ICD-10-CM | POA: Diagnosis not present

## 2017-09-08 DIAGNOSIS — F419 Anxiety disorder, unspecified: Secondary | ICD-10-CM | POA: Diagnosis not present

## 2017-09-08 DIAGNOSIS — M6281 Muscle weakness (generalized): Secondary | ICD-10-CM | POA: Diagnosis not present

## 2017-09-08 DIAGNOSIS — F0390 Unspecified dementia without behavioral disturbance: Secondary | ICD-10-CM | POA: Diagnosis not present

## 2017-09-08 DIAGNOSIS — R296 Repeated falls: Secondary | ICD-10-CM | POA: Diagnosis not present

## 2017-09-08 DIAGNOSIS — M1990 Unspecified osteoarthritis, unspecified site: Secondary | ICD-10-CM | POA: Diagnosis not present

## 2017-09-08 DIAGNOSIS — I10 Essential (primary) hypertension: Secondary | ICD-10-CM | POA: Diagnosis not present

## 2017-09-09 DIAGNOSIS — R296 Repeated falls: Secondary | ICD-10-CM | POA: Diagnosis not present

## 2017-09-09 DIAGNOSIS — F0390 Unspecified dementia without behavioral disturbance: Secondary | ICD-10-CM | POA: Diagnosis not present

## 2017-09-09 DIAGNOSIS — M6281 Muscle weakness (generalized): Secondary | ICD-10-CM | POA: Diagnosis not present

## 2017-09-09 DIAGNOSIS — I10 Essential (primary) hypertension: Secondary | ICD-10-CM | POA: Diagnosis not present

## 2017-09-09 DIAGNOSIS — F419 Anxiety disorder, unspecified: Secondary | ICD-10-CM | POA: Diagnosis not present

## 2017-09-09 DIAGNOSIS — M1990 Unspecified osteoarthritis, unspecified site: Secondary | ICD-10-CM | POA: Diagnosis not present

## 2017-09-11 DIAGNOSIS — F419 Anxiety disorder, unspecified: Secondary | ICD-10-CM | POA: Diagnosis not present

## 2017-09-11 DIAGNOSIS — M1990 Unspecified osteoarthritis, unspecified site: Secondary | ICD-10-CM | POA: Diagnosis not present

## 2017-09-11 DIAGNOSIS — R296 Repeated falls: Secondary | ICD-10-CM | POA: Diagnosis not present

## 2017-09-11 DIAGNOSIS — F0390 Unspecified dementia without behavioral disturbance: Secondary | ICD-10-CM | POA: Diagnosis not present

## 2017-09-11 DIAGNOSIS — M6281 Muscle weakness (generalized): Secondary | ICD-10-CM | POA: Diagnosis not present

## 2017-09-11 DIAGNOSIS — I10 Essential (primary) hypertension: Secondary | ICD-10-CM | POA: Diagnosis not present

## 2017-09-17 DIAGNOSIS — M1990 Unspecified osteoarthritis, unspecified site: Secondary | ICD-10-CM | POA: Diagnosis not present

## 2017-09-17 DIAGNOSIS — F0390 Unspecified dementia without behavioral disturbance: Secondary | ICD-10-CM | POA: Diagnosis not present

## 2017-09-17 DIAGNOSIS — M6281 Muscle weakness (generalized): Secondary | ICD-10-CM | POA: Diagnosis not present

## 2017-09-17 DIAGNOSIS — F419 Anxiety disorder, unspecified: Secondary | ICD-10-CM | POA: Diagnosis not present

## 2017-09-17 DIAGNOSIS — I10 Essential (primary) hypertension: Secondary | ICD-10-CM | POA: Diagnosis not present

## 2017-09-17 DIAGNOSIS — R296 Repeated falls: Secondary | ICD-10-CM | POA: Diagnosis not present

## 2017-09-23 DIAGNOSIS — R296 Repeated falls: Secondary | ICD-10-CM | POA: Diagnosis not present

## 2017-09-23 DIAGNOSIS — M1990 Unspecified osteoarthritis, unspecified site: Secondary | ICD-10-CM | POA: Diagnosis not present

## 2017-09-23 DIAGNOSIS — M6281 Muscle weakness (generalized): Secondary | ICD-10-CM | POA: Diagnosis not present

## 2017-09-23 DIAGNOSIS — F419 Anxiety disorder, unspecified: Secondary | ICD-10-CM | POA: Diagnosis not present

## 2017-09-23 DIAGNOSIS — F0390 Unspecified dementia without behavioral disturbance: Secondary | ICD-10-CM | POA: Diagnosis not present

## 2017-09-23 DIAGNOSIS — I10 Essential (primary) hypertension: Secondary | ICD-10-CM | POA: Diagnosis not present

## 2017-09-24 DIAGNOSIS — M6281 Muscle weakness (generalized): Secondary | ICD-10-CM | POA: Diagnosis not present

## 2017-09-24 DIAGNOSIS — I10 Essential (primary) hypertension: Secondary | ICD-10-CM | POA: Diagnosis not present

## 2017-09-24 DIAGNOSIS — F0390 Unspecified dementia without behavioral disturbance: Secondary | ICD-10-CM | POA: Diagnosis not present

## 2017-09-24 DIAGNOSIS — M1990 Unspecified osteoarthritis, unspecified site: Secondary | ICD-10-CM | POA: Diagnosis not present

## 2017-09-24 DIAGNOSIS — R296 Repeated falls: Secondary | ICD-10-CM | POA: Diagnosis not present

## 2017-09-24 DIAGNOSIS — F419 Anxiety disorder, unspecified: Secondary | ICD-10-CM | POA: Diagnosis not present

## 2017-09-26 DIAGNOSIS — R296 Repeated falls: Secondary | ICD-10-CM | POA: Diagnosis not present

## 2017-09-26 DIAGNOSIS — M6281 Muscle weakness (generalized): Secondary | ICD-10-CM | POA: Diagnosis not present

## 2017-09-26 DIAGNOSIS — F419 Anxiety disorder, unspecified: Secondary | ICD-10-CM | POA: Diagnosis not present

## 2017-09-26 DIAGNOSIS — F0390 Unspecified dementia without behavioral disturbance: Secondary | ICD-10-CM | POA: Diagnosis not present

## 2017-09-26 DIAGNOSIS — M1990 Unspecified osteoarthritis, unspecified site: Secondary | ICD-10-CM | POA: Diagnosis not present

## 2017-09-26 DIAGNOSIS — I10 Essential (primary) hypertension: Secondary | ICD-10-CM | POA: Diagnosis not present

## 2017-10-01 DIAGNOSIS — I10 Essential (primary) hypertension: Secondary | ICD-10-CM | POA: Diagnosis not present

## 2017-10-01 DIAGNOSIS — M1990 Unspecified osteoarthritis, unspecified site: Secondary | ICD-10-CM | POA: Diagnosis not present

## 2017-10-01 DIAGNOSIS — F0390 Unspecified dementia without behavioral disturbance: Secondary | ICD-10-CM | POA: Diagnosis not present

## 2017-10-01 DIAGNOSIS — R296 Repeated falls: Secondary | ICD-10-CM | POA: Diagnosis not present

## 2017-10-01 DIAGNOSIS — F419 Anxiety disorder, unspecified: Secondary | ICD-10-CM | POA: Diagnosis not present

## 2017-10-01 DIAGNOSIS — M6281 Muscle weakness (generalized): Secondary | ICD-10-CM | POA: Diagnosis not present

## 2017-10-02 DIAGNOSIS — F419 Anxiety disorder, unspecified: Secondary | ICD-10-CM | POA: Diagnosis not present

## 2017-10-02 DIAGNOSIS — I10 Essential (primary) hypertension: Secondary | ICD-10-CM | POA: Diagnosis not present

## 2017-10-02 DIAGNOSIS — F0390 Unspecified dementia without behavioral disturbance: Secondary | ICD-10-CM | POA: Diagnosis not present

## 2017-10-02 DIAGNOSIS — M1990 Unspecified osteoarthritis, unspecified site: Secondary | ICD-10-CM | POA: Diagnosis not present

## 2017-10-02 DIAGNOSIS — M6281 Muscle weakness (generalized): Secondary | ICD-10-CM | POA: Diagnosis not present

## 2017-10-02 DIAGNOSIS — R296 Repeated falls: Secondary | ICD-10-CM | POA: Diagnosis not present

## 2017-10-07 DIAGNOSIS — F0390 Unspecified dementia without behavioral disturbance: Secondary | ICD-10-CM | POA: Diagnosis not present

## 2017-10-07 DIAGNOSIS — M1990 Unspecified osteoarthritis, unspecified site: Secondary | ICD-10-CM | POA: Diagnosis not present

## 2017-10-07 DIAGNOSIS — M6281 Muscle weakness (generalized): Secondary | ICD-10-CM | POA: Diagnosis not present

## 2017-10-07 DIAGNOSIS — R296 Repeated falls: Secondary | ICD-10-CM | POA: Diagnosis not present

## 2017-10-07 DIAGNOSIS — I10 Essential (primary) hypertension: Secondary | ICD-10-CM | POA: Diagnosis not present

## 2017-10-07 DIAGNOSIS — F419 Anxiety disorder, unspecified: Secondary | ICD-10-CM | POA: Diagnosis not present

## 2017-10-15 DIAGNOSIS — F0390 Unspecified dementia without behavioral disturbance: Secondary | ICD-10-CM | POA: Diagnosis not present

## 2017-10-15 DIAGNOSIS — M1990 Unspecified osteoarthritis, unspecified site: Secondary | ICD-10-CM | POA: Diagnosis not present

## 2017-10-15 DIAGNOSIS — F419 Anxiety disorder, unspecified: Secondary | ICD-10-CM | POA: Diagnosis not present

## 2017-10-15 DIAGNOSIS — M6281 Muscle weakness (generalized): Secondary | ICD-10-CM | POA: Diagnosis not present

## 2017-10-15 DIAGNOSIS — R296 Repeated falls: Secondary | ICD-10-CM | POA: Diagnosis not present

## 2017-10-15 DIAGNOSIS — I10 Essential (primary) hypertension: Secondary | ICD-10-CM | POA: Diagnosis not present

## 2017-10-17 DIAGNOSIS — R296 Repeated falls: Secondary | ICD-10-CM | POA: Diagnosis not present

## 2017-10-17 DIAGNOSIS — M1990 Unspecified osteoarthritis, unspecified site: Secondary | ICD-10-CM | POA: Diagnosis not present

## 2017-10-17 DIAGNOSIS — F419 Anxiety disorder, unspecified: Secondary | ICD-10-CM | POA: Diagnosis not present

## 2017-10-17 DIAGNOSIS — M6281 Muscle weakness (generalized): Secondary | ICD-10-CM | POA: Diagnosis not present

## 2017-10-17 DIAGNOSIS — I10 Essential (primary) hypertension: Secondary | ICD-10-CM | POA: Diagnosis not present

## 2017-10-17 DIAGNOSIS — F0390 Unspecified dementia without behavioral disturbance: Secondary | ICD-10-CM | POA: Diagnosis not present

## 2017-10-20 DIAGNOSIS — F419 Anxiety disorder, unspecified: Secondary | ICD-10-CM | POA: Diagnosis not present

## 2017-10-20 DIAGNOSIS — I10 Essential (primary) hypertension: Secondary | ICD-10-CM | POA: Diagnosis not present

## 2017-10-20 DIAGNOSIS — F0390 Unspecified dementia without behavioral disturbance: Secondary | ICD-10-CM | POA: Diagnosis not present

## 2017-10-20 DIAGNOSIS — M6281 Muscle weakness (generalized): Secondary | ICD-10-CM | POA: Diagnosis not present

## 2017-10-20 DIAGNOSIS — M1990 Unspecified osteoarthritis, unspecified site: Secondary | ICD-10-CM | POA: Diagnosis not present

## 2017-10-20 DIAGNOSIS — R296 Repeated falls: Secondary | ICD-10-CM | POA: Diagnosis not present

## 2017-10-22 DIAGNOSIS — F419 Anxiety disorder, unspecified: Secondary | ICD-10-CM | POA: Diagnosis not present

## 2017-10-22 DIAGNOSIS — I10 Essential (primary) hypertension: Secondary | ICD-10-CM | POA: Diagnosis not present

## 2017-10-22 DIAGNOSIS — M1990 Unspecified osteoarthritis, unspecified site: Secondary | ICD-10-CM | POA: Diagnosis not present

## 2017-10-22 DIAGNOSIS — F0390 Unspecified dementia without behavioral disturbance: Secondary | ICD-10-CM | POA: Diagnosis not present

## 2017-10-22 DIAGNOSIS — R296 Repeated falls: Secondary | ICD-10-CM | POA: Diagnosis not present

## 2017-10-22 DIAGNOSIS — M6281 Muscle weakness (generalized): Secondary | ICD-10-CM | POA: Diagnosis not present

## 2017-10-28 DIAGNOSIS — I10 Essential (primary) hypertension: Secondary | ICD-10-CM | POA: Diagnosis not present

## 2017-10-28 DIAGNOSIS — F0390 Unspecified dementia without behavioral disturbance: Secondary | ICD-10-CM | POA: Diagnosis not present

## 2017-10-28 DIAGNOSIS — M6281 Muscle weakness (generalized): Secondary | ICD-10-CM | POA: Diagnosis not present

## 2017-10-28 DIAGNOSIS — M1990 Unspecified osteoarthritis, unspecified site: Secondary | ICD-10-CM | POA: Diagnosis not present

## 2017-10-28 DIAGNOSIS — R296 Repeated falls: Secondary | ICD-10-CM | POA: Diagnosis not present

## 2017-10-28 DIAGNOSIS — F419 Anxiety disorder, unspecified: Secondary | ICD-10-CM | POA: Diagnosis not present

## 2017-10-29 DIAGNOSIS — R296 Repeated falls: Secondary | ICD-10-CM | POA: Diagnosis not present

## 2017-10-29 DIAGNOSIS — F419 Anxiety disorder, unspecified: Secondary | ICD-10-CM | POA: Diagnosis not present

## 2017-10-29 DIAGNOSIS — I10 Essential (primary) hypertension: Secondary | ICD-10-CM | POA: Diagnosis not present

## 2017-10-29 DIAGNOSIS — M6281 Muscle weakness (generalized): Secondary | ICD-10-CM | POA: Diagnosis not present

## 2017-10-29 DIAGNOSIS — R2689 Other abnormalities of gait and mobility: Secondary | ICD-10-CM | POA: Diagnosis not present

## 2017-10-29 DIAGNOSIS — M1990 Unspecified osteoarthritis, unspecified site: Secondary | ICD-10-CM | POA: Diagnosis not present

## 2017-10-29 DIAGNOSIS — F0390 Unspecified dementia without behavioral disturbance: Secondary | ICD-10-CM | POA: Diagnosis not present

## 2017-10-30 DIAGNOSIS — M1990 Unspecified osteoarthritis, unspecified site: Secondary | ICD-10-CM | POA: Diagnosis not present

## 2017-10-30 DIAGNOSIS — F0390 Unspecified dementia without behavioral disturbance: Secondary | ICD-10-CM | POA: Diagnosis not present

## 2017-10-30 DIAGNOSIS — R296 Repeated falls: Secondary | ICD-10-CM | POA: Diagnosis not present

## 2017-10-30 DIAGNOSIS — F419 Anxiety disorder, unspecified: Secondary | ICD-10-CM | POA: Diagnosis not present

## 2017-10-30 DIAGNOSIS — I10 Essential (primary) hypertension: Secondary | ICD-10-CM | POA: Diagnosis not present

## 2017-10-30 DIAGNOSIS — M6281 Muscle weakness (generalized): Secondary | ICD-10-CM | POA: Diagnosis not present

## 2017-11-06 DIAGNOSIS — M6281 Muscle weakness (generalized): Secondary | ICD-10-CM | POA: Diagnosis not present

## 2017-11-06 DIAGNOSIS — F0390 Unspecified dementia without behavioral disturbance: Secondary | ICD-10-CM | POA: Diagnosis not present

## 2017-11-06 DIAGNOSIS — F419 Anxiety disorder, unspecified: Secondary | ICD-10-CM | POA: Diagnosis not present

## 2017-11-06 DIAGNOSIS — M1990 Unspecified osteoarthritis, unspecified site: Secondary | ICD-10-CM | POA: Diagnosis not present

## 2017-11-06 DIAGNOSIS — R296 Repeated falls: Secondary | ICD-10-CM | POA: Diagnosis not present

## 2017-11-06 DIAGNOSIS — I10 Essential (primary) hypertension: Secondary | ICD-10-CM | POA: Diagnosis not present

## 2017-11-07 DIAGNOSIS — F0390 Unspecified dementia without behavioral disturbance: Secondary | ICD-10-CM | POA: Diagnosis not present

## 2017-11-07 DIAGNOSIS — M1990 Unspecified osteoarthritis, unspecified site: Secondary | ICD-10-CM | POA: Diagnosis not present

## 2017-11-07 DIAGNOSIS — R296 Repeated falls: Secondary | ICD-10-CM | POA: Diagnosis not present

## 2017-11-07 DIAGNOSIS — F419 Anxiety disorder, unspecified: Secondary | ICD-10-CM | POA: Diagnosis not present

## 2017-11-07 DIAGNOSIS — M6281 Muscle weakness (generalized): Secondary | ICD-10-CM | POA: Diagnosis not present

## 2017-11-07 DIAGNOSIS — I10 Essential (primary) hypertension: Secondary | ICD-10-CM | POA: Diagnosis not present

## 2017-11-10 DIAGNOSIS — F419 Anxiety disorder, unspecified: Secondary | ICD-10-CM | POA: Diagnosis not present

## 2017-11-10 DIAGNOSIS — I10 Essential (primary) hypertension: Secondary | ICD-10-CM | POA: Diagnosis not present

## 2017-11-10 DIAGNOSIS — F0390 Unspecified dementia without behavioral disturbance: Secondary | ICD-10-CM | POA: Diagnosis not present

## 2017-11-10 DIAGNOSIS — M6281 Muscle weakness (generalized): Secondary | ICD-10-CM | POA: Diagnosis not present

## 2017-11-10 DIAGNOSIS — M1990 Unspecified osteoarthritis, unspecified site: Secondary | ICD-10-CM | POA: Diagnosis not present

## 2017-11-10 DIAGNOSIS — R296 Repeated falls: Secondary | ICD-10-CM | POA: Diagnosis not present

## 2017-11-13 DIAGNOSIS — I10 Essential (primary) hypertension: Secondary | ICD-10-CM | POA: Diagnosis not present

## 2017-11-13 DIAGNOSIS — M6281 Muscle weakness (generalized): Secondary | ICD-10-CM | POA: Diagnosis not present

## 2017-11-13 DIAGNOSIS — R296 Repeated falls: Secondary | ICD-10-CM | POA: Diagnosis not present

## 2017-11-13 DIAGNOSIS — F419 Anxiety disorder, unspecified: Secondary | ICD-10-CM | POA: Diagnosis not present

## 2017-11-13 DIAGNOSIS — F0390 Unspecified dementia without behavioral disturbance: Secondary | ICD-10-CM | POA: Diagnosis not present

## 2017-11-13 DIAGNOSIS — M1990 Unspecified osteoarthritis, unspecified site: Secondary | ICD-10-CM | POA: Diagnosis not present

## 2017-11-19 DIAGNOSIS — M6281 Muscle weakness (generalized): Secondary | ICD-10-CM | POA: Diagnosis not present

## 2017-11-19 DIAGNOSIS — I10 Essential (primary) hypertension: Secondary | ICD-10-CM | POA: Diagnosis not present

## 2017-11-19 DIAGNOSIS — M1990 Unspecified osteoarthritis, unspecified site: Secondary | ICD-10-CM | POA: Diagnosis not present

## 2017-11-19 DIAGNOSIS — R296 Repeated falls: Secondary | ICD-10-CM | POA: Diagnosis not present

## 2017-11-19 DIAGNOSIS — F0390 Unspecified dementia without behavioral disturbance: Secondary | ICD-10-CM | POA: Diagnosis not present

## 2017-11-19 DIAGNOSIS — F419 Anxiety disorder, unspecified: Secondary | ICD-10-CM | POA: Diagnosis not present

## 2017-11-20 DIAGNOSIS — M1990 Unspecified osteoarthritis, unspecified site: Secondary | ICD-10-CM | POA: Diagnosis not present

## 2017-11-20 DIAGNOSIS — M6281 Muscle weakness (generalized): Secondary | ICD-10-CM | POA: Diagnosis not present

## 2017-11-20 DIAGNOSIS — F0390 Unspecified dementia without behavioral disturbance: Secondary | ICD-10-CM | POA: Diagnosis not present

## 2017-11-20 DIAGNOSIS — R296 Repeated falls: Secondary | ICD-10-CM | POA: Diagnosis not present

## 2017-11-20 DIAGNOSIS — F419 Anxiety disorder, unspecified: Secondary | ICD-10-CM | POA: Diagnosis not present

## 2017-11-20 DIAGNOSIS — I10 Essential (primary) hypertension: Secondary | ICD-10-CM | POA: Diagnosis not present

## 2017-11-24 DIAGNOSIS — M6281 Muscle weakness (generalized): Secondary | ICD-10-CM | POA: Diagnosis not present

## 2017-11-24 DIAGNOSIS — I10 Essential (primary) hypertension: Secondary | ICD-10-CM | POA: Diagnosis not present

## 2017-11-24 DIAGNOSIS — F419 Anxiety disorder, unspecified: Secondary | ICD-10-CM | POA: Diagnosis not present

## 2017-11-24 DIAGNOSIS — R296 Repeated falls: Secondary | ICD-10-CM | POA: Diagnosis not present

## 2017-11-24 DIAGNOSIS — F0390 Unspecified dementia without behavioral disturbance: Secondary | ICD-10-CM | POA: Diagnosis not present

## 2017-11-24 DIAGNOSIS — M1990 Unspecified osteoarthritis, unspecified site: Secondary | ICD-10-CM | POA: Diagnosis not present

## 2017-11-25 DIAGNOSIS — Z23 Encounter for immunization: Secondary | ICD-10-CM | POA: Diagnosis not present

## 2017-11-27 DIAGNOSIS — F419 Anxiety disorder, unspecified: Secondary | ICD-10-CM | POA: Diagnosis not present

## 2017-11-27 DIAGNOSIS — F0390 Unspecified dementia without behavioral disturbance: Secondary | ICD-10-CM | POA: Diagnosis not present

## 2017-11-27 DIAGNOSIS — I10 Essential (primary) hypertension: Secondary | ICD-10-CM | POA: Diagnosis not present

## 2017-11-27 DIAGNOSIS — R296 Repeated falls: Secondary | ICD-10-CM | POA: Diagnosis not present

## 2017-11-27 DIAGNOSIS — M6281 Muscle weakness (generalized): Secondary | ICD-10-CM | POA: Diagnosis not present

## 2017-11-27 DIAGNOSIS — M1990 Unspecified osteoarthritis, unspecified site: Secondary | ICD-10-CM | POA: Diagnosis not present

## 2017-12-01 DIAGNOSIS — F0390 Unspecified dementia without behavioral disturbance: Secondary | ICD-10-CM | POA: Diagnosis not present

## 2017-12-01 DIAGNOSIS — F419 Anxiety disorder, unspecified: Secondary | ICD-10-CM | POA: Diagnosis not present

## 2017-12-01 DIAGNOSIS — M1990 Unspecified osteoarthritis, unspecified site: Secondary | ICD-10-CM | POA: Diagnosis not present

## 2017-12-01 DIAGNOSIS — I10 Essential (primary) hypertension: Secondary | ICD-10-CM | POA: Diagnosis not present

## 2017-12-01 DIAGNOSIS — R296 Repeated falls: Secondary | ICD-10-CM | POA: Diagnosis not present

## 2017-12-01 DIAGNOSIS — M6281 Muscle weakness (generalized): Secondary | ICD-10-CM | POA: Diagnosis not present

## 2017-12-03 DIAGNOSIS — R296 Repeated falls: Secondary | ICD-10-CM | POA: Diagnosis not present

## 2017-12-03 DIAGNOSIS — F419 Anxiety disorder, unspecified: Secondary | ICD-10-CM | POA: Diagnosis not present

## 2017-12-03 DIAGNOSIS — I10 Essential (primary) hypertension: Secondary | ICD-10-CM | POA: Diagnosis not present

## 2017-12-03 DIAGNOSIS — M1990 Unspecified osteoarthritis, unspecified site: Secondary | ICD-10-CM | POA: Diagnosis not present

## 2017-12-03 DIAGNOSIS — F0390 Unspecified dementia without behavioral disturbance: Secondary | ICD-10-CM | POA: Diagnosis not present

## 2017-12-03 DIAGNOSIS — M6281 Muscle weakness (generalized): Secondary | ICD-10-CM | POA: Diagnosis not present

## 2017-12-09 DIAGNOSIS — M6281 Muscle weakness (generalized): Secondary | ICD-10-CM | POA: Diagnosis not present

## 2017-12-09 DIAGNOSIS — R269 Unspecified abnormalities of gait and mobility: Secondary | ICD-10-CM | POA: Diagnosis not present

## 2017-12-09 DIAGNOSIS — M1990 Unspecified osteoarthritis, unspecified site: Secondary | ICD-10-CM | POA: Diagnosis not present

## 2017-12-16 DIAGNOSIS — M6281 Muscle weakness (generalized): Secondary | ICD-10-CM | POA: Diagnosis not present

## 2017-12-16 DIAGNOSIS — R269 Unspecified abnormalities of gait and mobility: Secondary | ICD-10-CM | POA: Diagnosis not present

## 2017-12-16 DIAGNOSIS — M1990 Unspecified osteoarthritis, unspecified site: Secondary | ICD-10-CM | POA: Diagnosis not present

## 2017-12-18 DIAGNOSIS — M1990 Unspecified osteoarthritis, unspecified site: Secondary | ICD-10-CM | POA: Diagnosis not present

## 2017-12-18 DIAGNOSIS — M6281 Muscle weakness (generalized): Secondary | ICD-10-CM | POA: Diagnosis not present

## 2017-12-18 DIAGNOSIS — R269 Unspecified abnormalities of gait and mobility: Secondary | ICD-10-CM | POA: Diagnosis not present

## 2017-12-23 DIAGNOSIS — R269 Unspecified abnormalities of gait and mobility: Secondary | ICD-10-CM | POA: Diagnosis not present

## 2017-12-23 DIAGNOSIS — M1990 Unspecified osteoarthritis, unspecified site: Secondary | ICD-10-CM | POA: Diagnosis not present

## 2017-12-23 DIAGNOSIS — M6281 Muscle weakness (generalized): Secondary | ICD-10-CM | POA: Diagnosis not present

## 2017-12-24 DIAGNOSIS — H25813 Combined forms of age-related cataract, bilateral: Secondary | ICD-10-CM | POA: Diagnosis not present

## 2017-12-25 DIAGNOSIS — R269 Unspecified abnormalities of gait and mobility: Secondary | ICD-10-CM | POA: Diagnosis not present

## 2017-12-25 DIAGNOSIS — M1990 Unspecified osteoarthritis, unspecified site: Secondary | ICD-10-CM | POA: Diagnosis not present

## 2017-12-25 DIAGNOSIS — M6281 Muscle weakness (generalized): Secondary | ICD-10-CM | POA: Diagnosis not present

## 2017-12-30 DIAGNOSIS — R269 Unspecified abnormalities of gait and mobility: Secondary | ICD-10-CM | POA: Diagnosis not present

## 2017-12-30 DIAGNOSIS — M6281 Muscle weakness (generalized): Secondary | ICD-10-CM | POA: Diagnosis not present

## 2017-12-30 DIAGNOSIS — M1990 Unspecified osteoarthritis, unspecified site: Secondary | ICD-10-CM | POA: Diagnosis not present

## 2017-12-31 DIAGNOSIS — M1990 Unspecified osteoarthritis, unspecified site: Secondary | ICD-10-CM | POA: Diagnosis not present

## 2017-12-31 DIAGNOSIS — R269 Unspecified abnormalities of gait and mobility: Secondary | ICD-10-CM | POA: Diagnosis not present

## 2017-12-31 DIAGNOSIS — M6281 Muscle weakness (generalized): Secondary | ICD-10-CM | POA: Diagnosis not present

## 2018-01-06 DIAGNOSIS — M79675 Pain in left toe(s): Secondary | ICD-10-CM | POA: Diagnosis not present

## 2018-01-06 DIAGNOSIS — M6281 Muscle weakness (generalized): Secondary | ICD-10-CM | POA: Diagnosis not present

## 2018-01-06 DIAGNOSIS — B351 Tinea unguium: Secondary | ICD-10-CM | POA: Diagnosis not present

## 2018-01-06 DIAGNOSIS — M1990 Unspecified osteoarthritis, unspecified site: Secondary | ICD-10-CM | POA: Diagnosis not present

## 2018-01-06 DIAGNOSIS — M79674 Pain in right toe(s): Secondary | ICD-10-CM | POA: Diagnosis not present

## 2018-01-06 DIAGNOSIS — R269 Unspecified abnormalities of gait and mobility: Secondary | ICD-10-CM | POA: Diagnosis not present

## 2018-01-07 ENCOUNTER — Ambulatory Visit: Payer: Medicare Other | Admitting: Cardiovascular Disease

## 2018-01-08 DIAGNOSIS — M1990 Unspecified osteoarthritis, unspecified site: Secondary | ICD-10-CM | POA: Diagnosis not present

## 2018-01-08 DIAGNOSIS — R269 Unspecified abnormalities of gait and mobility: Secondary | ICD-10-CM | POA: Diagnosis not present

## 2018-01-08 DIAGNOSIS — M6281 Muscle weakness (generalized): Secondary | ICD-10-CM | POA: Diagnosis not present

## 2018-01-13 DIAGNOSIS — R269 Unspecified abnormalities of gait and mobility: Secondary | ICD-10-CM | POA: Diagnosis not present

## 2018-01-13 DIAGNOSIS — M1990 Unspecified osteoarthritis, unspecified site: Secondary | ICD-10-CM | POA: Diagnosis not present

## 2018-01-13 DIAGNOSIS — M6281 Muscle weakness (generalized): Secondary | ICD-10-CM | POA: Diagnosis not present

## 2018-01-15 DIAGNOSIS — M1990 Unspecified osteoarthritis, unspecified site: Secondary | ICD-10-CM | POA: Diagnosis not present

## 2018-01-15 DIAGNOSIS — M6281 Muscle weakness (generalized): Secondary | ICD-10-CM | POA: Diagnosis not present

## 2018-01-15 DIAGNOSIS — R269 Unspecified abnormalities of gait and mobility: Secondary | ICD-10-CM | POA: Diagnosis not present

## 2018-01-20 DIAGNOSIS — M1990 Unspecified osteoarthritis, unspecified site: Secondary | ICD-10-CM | POA: Diagnosis not present

## 2018-01-20 DIAGNOSIS — M6281 Muscle weakness (generalized): Secondary | ICD-10-CM | POA: Diagnosis not present

## 2018-01-20 DIAGNOSIS — R269 Unspecified abnormalities of gait and mobility: Secondary | ICD-10-CM | POA: Diagnosis not present

## 2018-01-22 DIAGNOSIS — M6281 Muscle weakness (generalized): Secondary | ICD-10-CM | POA: Diagnosis not present

## 2018-01-22 DIAGNOSIS — M1990 Unspecified osteoarthritis, unspecified site: Secondary | ICD-10-CM | POA: Diagnosis not present

## 2018-01-22 DIAGNOSIS — R269 Unspecified abnormalities of gait and mobility: Secondary | ICD-10-CM | POA: Diagnosis not present

## 2018-01-27 DIAGNOSIS — R269 Unspecified abnormalities of gait and mobility: Secondary | ICD-10-CM | POA: Diagnosis not present

## 2018-01-27 DIAGNOSIS — M6281 Muscle weakness (generalized): Secondary | ICD-10-CM | POA: Diagnosis not present

## 2018-01-27 DIAGNOSIS — M1990 Unspecified osteoarthritis, unspecified site: Secondary | ICD-10-CM | POA: Diagnosis not present

## 2018-01-29 DIAGNOSIS — M1990 Unspecified osteoarthritis, unspecified site: Secondary | ICD-10-CM | POA: Diagnosis not present

## 2018-01-29 DIAGNOSIS — M6281 Muscle weakness (generalized): Secondary | ICD-10-CM | POA: Diagnosis not present

## 2018-01-29 DIAGNOSIS — R269 Unspecified abnormalities of gait and mobility: Secondary | ICD-10-CM | POA: Diagnosis not present

## 2018-02-03 DIAGNOSIS — R269 Unspecified abnormalities of gait and mobility: Secondary | ICD-10-CM | POA: Diagnosis not present

## 2018-02-03 DIAGNOSIS — M6281 Muscle weakness (generalized): Secondary | ICD-10-CM | POA: Diagnosis not present

## 2018-02-03 DIAGNOSIS — M1990 Unspecified osteoarthritis, unspecified site: Secondary | ICD-10-CM | POA: Diagnosis not present

## 2018-03-04 ENCOUNTER — Ambulatory Visit (INDEPENDENT_AMBULATORY_CARE_PROVIDER_SITE_OTHER): Payer: Medicare Other | Admitting: Cardiovascular Disease

## 2018-03-04 ENCOUNTER — Encounter: Payer: Self-pay | Admitting: Cardiovascular Disease

## 2018-03-04 VITALS — BP 128/76 | HR 86 | Ht 62.0 in

## 2018-03-04 DIAGNOSIS — R072 Precordial pain: Secondary | ICD-10-CM

## 2018-03-04 DIAGNOSIS — I10 Essential (primary) hypertension: Secondary | ICD-10-CM

## 2018-03-04 NOTE — Patient Instructions (Signed)
Medication Instructions:  Your physician recommends that you continue on your current medications as directed. Please refer to the Current Medication list given to you today.   Labwork: NONE  Testing/Procedures: NONE  Follow-Up: Your physician recommends that you schedule a follow-up appointment in: AS NEEDED      Any Other Special Instructions Will Be Listed Below (If Applicable).     If you need a refill on your cardiac medications before your next appointment, please call your pharmacy.   

## 2018-03-04 NOTE — Progress Notes (Signed)
SUBJECTIVE: The patient presents for follow-up of chest pain and hypertension.  I last saw her in October 2018.  ECG performed in the office today which I ordered and personally interpreted demonstrates normal sinus rhythm with right bundle branch block and left anterior fascicular block with nonspecific T wave abnormalities.  [place]. She is here with an aide.  The patient denies any symptoms of chest pain, palpitations, shortness of breath, lightheadedness, dizziness, leg swelling, orthopnea, PND, and syncope.  She told me she feels blessed to be in such good health for her age.    Review of Systems: As per "subjective", otherwise negative.  Allergies  Allergen Reactions  . Hydrocodone-Acetaminophen Nausea And Vomiting    Current Outpatient Medications  Medication Sig Dispense Refill  . acetaminophen (TYLENOL) 500 MG tablet Take 1,000 mg by mouth 3 (three) times daily. For pain    . amLODipine (NORVASC) 5 MG tablet Take 1 tablet (5 mg total) by mouth at bedtime. 30 tablet 0  . dextromethorphan-guaiFENesin (TUSSIN DM) 10-100 MG/5ML liquid Take 10 mLs by mouth every 6 (six) hours as needed for cough.    . donepezil (ARICEPT) 5 MG tablet Take 1 tablet (5 mg total) by mouth at bedtime. 30 tablet 12  . hydrochlorothiazide (MICROZIDE) 12.5 MG capsule Take 12.5 mg by mouth every morning.    . loperamide (IMODIUM) 2 MG capsule Take 2 mg by mouth every 4 (four) hours as needed for diarrhea or loose stools.    Marland Kitchen. losartan (COZAAR) 100 MG tablet Take 100 mg by mouth daily.    Marland Kitchen. omeprazole (PRILOSEC) 40 MG capsule Take 40 mg by mouth daily.     No current facility-administered medications for this visit.     Past Medical History:  Diagnosis Date  . Anxiety   . Dementia (HCC)   . GERD (gastroesophageal reflux disease)   . Goiter   . Hypertension   . Osteoarthritis   . Osteoporosis   . Vitamin D insufficiency     Past Surgical History:  Procedure  Laterality Date  . APPENDECTOMY    . KIDNEY STONE SURGERY    . ORIF HIP FRACTURE Right 11/20/2012   Procedure: OPEN REDUCTION INTERNAL FIXATION RIGHT HIP;  Surgeon: Darreld McleanWayne Keeling, MD;  Location: [facility];  Service: Orthopedics;  Laterality: Right;  . stress fractures of legs    . TUBAL LIGATION      Social History   Socioeconomic History  . Marital status: Widowed    Spouse name: Not on file  . Number of children: Not on file  . Years of education: Not on file  . Highest education level: Not on file  Occupational History  . Not on file  Social Needs  . Financial resource strain: Not on file  . Food insecurity:    Worry: Not on file    Inability: Not on file  . Transportation needs:    Medical: Not on file    Non-medical: Not on file  Tobacco Use  . Smoking status: Never Smoker  . Smokeless tobacco: Never Used  Substance and Sexual Activity  . Alcohol use: No  . Drug use: No  . Sexual activity: Never  Lifestyle  . Physical activity:    Days per week: Not on file    Minutes per session: Not on file  . Stress: Not on file  Relationships  . Social connections:    Talks on phone: Not on file  Gets together: Not on file    Attends religious service: Not on file    Active member of club or organization: Not on file    Attends meetings of clubs or organizations: Not on file    Relationship status: Not on file  . Intimate partner violence:    Fear of current or ex partner: Not on file    Emotionally abused: Not on file    Physically abused: Not on file    Forced sexual activity: Not on file  Other Topics Concern  . Not on file  Social History Narrative  . Not on file     Vitals:   03/04/18 1351  BP: 128/76  Pulse: 86  SpO2: 96%  Height: 5\' 2"  (1.575 m)    Wt Readings from Last 3 Encounters:  12/04/16 143 lb (64.9 kg)  06/03/16 140 lb (63.5 kg)  05/14/16 139 lb 14.4 oz (63.5 kg)     PHYSICAL EXAM General: NAD HEENT: Normal. Neck: No JVD, no  thyromegaly. Lungs: Clear to auscultation bilaterally with normal respiratory effort. CV: Regular rate and rhythm, normal S1/S2, no S3/S4, no murmur. No pretibial or periankle edema.  No carotid bruit.   Abdomen: Soft, nontender, no distention.  Neurologic: Alert and oriented.  Psych: Normal affect. Skin: Normal. Musculoskeletal: No gross deformities.    ECG: Reviewed above under Subjective   Labs: Lab Results  Component Value Date/Time   K 3.7 12/04/2016 01:48 PM   BUN 16 12/04/2016 01:48 PM   CREATININE 0.93 (H) 12/04/2016 01:48 PM   ALT 13 (L) 11/05/2014 10:05 PM   TSH 0.49 12/04/2016 01:48 PM   HGB 13.0 12/04/2016 01:48 PM     Lipids: No results found for: LDLCALC, LDLDIRECT, CHOL, TRIG, HDL     ASSESSMENT AND PLAN: 1.  Chest pain: No recurrence of symptoms.  No cardiac testing is indicated at this time.  2.  Hypertension: Controlled on present therapy reviewed above.  No changes.    Disposition: Follow up as needed   Prentice Docker, M.D., F.A.C.C.

## 2018-03-10 DIAGNOSIS — B351 Tinea unguium: Secondary | ICD-10-CM | POA: Diagnosis not present

## 2018-03-10 DIAGNOSIS — M79674 Pain in right toe(s): Secondary | ICD-10-CM | POA: Diagnosis not present

## 2018-03-10 DIAGNOSIS — M79675 Pain in left toe(s): Secondary | ICD-10-CM | POA: Diagnosis not present

## 2018-04-21 DIAGNOSIS — M1990 Unspecified osteoarthritis, unspecified site: Secondary | ICD-10-CM | POA: Diagnosis not present

## 2018-04-21 DIAGNOSIS — R269 Unspecified abnormalities of gait and mobility: Secondary | ICD-10-CM | POA: Diagnosis not present

## 2018-04-21 DIAGNOSIS — M6281 Muscle weakness (generalized): Secondary | ICD-10-CM | POA: Diagnosis not present

## 2018-04-28 DIAGNOSIS — F0280 Dementia in other diseases classified elsewhere without behavioral disturbance: Secondary | ICD-10-CM | POA: Diagnosis not present

## 2018-04-28 DIAGNOSIS — R278 Other lack of coordination: Secondary | ICD-10-CM | POA: Diagnosis not present

## 2018-04-30 DIAGNOSIS — R269 Unspecified abnormalities of gait and mobility: Secondary | ICD-10-CM | POA: Diagnosis not present

## 2018-04-30 DIAGNOSIS — R278 Other lack of coordination: Secondary | ICD-10-CM | POA: Diagnosis not present

## 2018-04-30 DIAGNOSIS — F0280 Dementia in other diseases classified elsewhere without behavioral disturbance: Secondary | ICD-10-CM | POA: Diagnosis not present

## 2018-04-30 DIAGNOSIS — M6281 Muscle weakness (generalized): Secondary | ICD-10-CM | POA: Diagnosis not present

## 2018-04-30 DIAGNOSIS — M1990 Unspecified osteoarthritis, unspecified site: Secondary | ICD-10-CM | POA: Diagnosis not present

## 2018-05-05 DIAGNOSIS — R278 Other lack of coordination: Secondary | ICD-10-CM | POA: Diagnosis not present

## 2018-05-05 DIAGNOSIS — F0280 Dementia in other diseases classified elsewhere without behavioral disturbance: Secondary | ICD-10-CM | POA: Diagnosis not present

## 2018-05-05 DIAGNOSIS — R269 Unspecified abnormalities of gait and mobility: Secondary | ICD-10-CM | POA: Diagnosis not present

## 2018-05-05 DIAGNOSIS — M6281 Muscle weakness (generalized): Secondary | ICD-10-CM | POA: Diagnosis not present

## 2018-05-05 DIAGNOSIS — M1990 Unspecified osteoarthritis, unspecified site: Secondary | ICD-10-CM | POA: Diagnosis not present

## 2018-05-06 DIAGNOSIS — F0280 Dementia in other diseases classified elsewhere without behavioral disturbance: Secondary | ICD-10-CM | POA: Diagnosis not present

## 2018-05-06 DIAGNOSIS — R278 Other lack of coordination: Secondary | ICD-10-CM | POA: Diagnosis not present

## 2018-05-07 DIAGNOSIS — M6281 Muscle weakness (generalized): Secondary | ICD-10-CM | POA: Diagnosis not present

## 2018-05-07 DIAGNOSIS — M1990 Unspecified osteoarthritis, unspecified site: Secondary | ICD-10-CM | POA: Diagnosis not present

## 2018-05-07 DIAGNOSIS — R269 Unspecified abnormalities of gait and mobility: Secondary | ICD-10-CM | POA: Diagnosis not present

## 2018-05-11 DIAGNOSIS — R278 Other lack of coordination: Secondary | ICD-10-CM | POA: Diagnosis not present

## 2018-05-11 DIAGNOSIS — F0280 Dementia in other diseases classified elsewhere without behavioral disturbance: Secondary | ICD-10-CM | POA: Diagnosis not present

## 2018-05-12 DIAGNOSIS — R269 Unspecified abnormalities of gait and mobility: Secondary | ICD-10-CM | POA: Diagnosis not present

## 2018-05-12 DIAGNOSIS — M6281 Muscle weakness (generalized): Secondary | ICD-10-CM | POA: Diagnosis not present

## 2018-05-12 DIAGNOSIS — M1990 Unspecified osteoarthritis, unspecified site: Secondary | ICD-10-CM | POA: Diagnosis not present

## 2018-05-13 DIAGNOSIS — I10 Essential (primary) hypertension: Secondary | ICD-10-CM | POA: Diagnosis not present

## 2018-05-13 DIAGNOSIS — R278 Other lack of coordination: Secondary | ICD-10-CM | POA: Diagnosis not present

## 2018-05-13 DIAGNOSIS — F0280 Dementia in other diseases classified elsewhere without behavioral disturbance: Secondary | ICD-10-CM | POA: Diagnosis not present

## 2018-05-13 DIAGNOSIS — G309 Alzheimer's disease, unspecified: Secondary | ICD-10-CM | POA: Diagnosis not present

## 2018-05-14 DIAGNOSIS — M1990 Unspecified osteoarthritis, unspecified site: Secondary | ICD-10-CM | POA: Diagnosis not present

## 2018-05-14 DIAGNOSIS — R269 Unspecified abnormalities of gait and mobility: Secondary | ICD-10-CM | POA: Diagnosis not present

## 2018-05-14 DIAGNOSIS — M6281 Muscle weakness (generalized): Secondary | ICD-10-CM | POA: Diagnosis not present

## 2018-05-15 DIAGNOSIS — I10 Essential (primary) hypertension: Secondary | ICD-10-CM | POA: Diagnosis not present

## 2018-05-15 DIAGNOSIS — I4510 Unspecified right bundle-branch block: Secondary | ICD-10-CM | POA: Diagnosis not present

## 2018-05-15 DIAGNOSIS — F0280 Dementia in other diseases classified elsewhere without behavioral disturbance: Secondary | ICD-10-CM | POA: Diagnosis not present

## 2018-05-15 DIAGNOSIS — F419 Anxiety disorder, unspecified: Secondary | ICD-10-CM | POA: Diagnosis not present

## 2018-05-15 DIAGNOSIS — R2681 Unsteadiness on feet: Secondary | ICD-10-CM | POA: Diagnosis not present

## 2018-05-15 DIAGNOSIS — G309 Alzheimer's disease, unspecified: Secondary | ICD-10-CM | POA: Diagnosis not present

## 2018-05-19 DIAGNOSIS — F0280 Dementia in other diseases classified elsewhere without behavioral disturbance: Secondary | ICD-10-CM | POA: Diagnosis not present

## 2018-05-19 DIAGNOSIS — G309 Alzheimer's disease, unspecified: Secondary | ICD-10-CM | POA: Diagnosis not present

## 2018-05-19 DIAGNOSIS — R2681 Unsteadiness on feet: Secondary | ICD-10-CM | POA: Diagnosis not present

## 2018-05-19 DIAGNOSIS — I10 Essential (primary) hypertension: Secondary | ICD-10-CM | POA: Diagnosis not present

## 2018-05-19 DIAGNOSIS — I4510 Unspecified right bundle-branch block: Secondary | ICD-10-CM | POA: Diagnosis not present

## 2018-05-19 DIAGNOSIS — F419 Anxiety disorder, unspecified: Secondary | ICD-10-CM | POA: Diagnosis not present

## 2018-05-21 DIAGNOSIS — I10 Essential (primary) hypertension: Secondary | ICD-10-CM | POA: Diagnosis not present

## 2018-05-21 DIAGNOSIS — F0280 Dementia in other diseases classified elsewhere without behavioral disturbance: Secondary | ICD-10-CM | POA: Diagnosis not present

## 2018-05-21 DIAGNOSIS — R2681 Unsteadiness on feet: Secondary | ICD-10-CM | POA: Diagnosis not present

## 2018-05-21 DIAGNOSIS — I4510 Unspecified right bundle-branch block: Secondary | ICD-10-CM | POA: Diagnosis not present

## 2018-05-21 DIAGNOSIS — F419 Anxiety disorder, unspecified: Secondary | ICD-10-CM | POA: Diagnosis not present

## 2018-05-21 DIAGNOSIS — G309 Alzheimer's disease, unspecified: Secondary | ICD-10-CM | POA: Diagnosis not present

## 2018-05-25 DIAGNOSIS — G309 Alzheimer's disease, unspecified: Secondary | ICD-10-CM | POA: Diagnosis not present

## 2018-05-25 DIAGNOSIS — F419 Anxiety disorder, unspecified: Secondary | ICD-10-CM | POA: Diagnosis not present

## 2018-05-25 DIAGNOSIS — R2681 Unsteadiness on feet: Secondary | ICD-10-CM | POA: Diagnosis not present

## 2018-05-25 DIAGNOSIS — I4510 Unspecified right bundle-branch block: Secondary | ICD-10-CM | POA: Diagnosis not present

## 2018-05-25 DIAGNOSIS — F0280 Dementia in other diseases classified elsewhere without behavioral disturbance: Secondary | ICD-10-CM | POA: Diagnosis not present

## 2018-05-25 DIAGNOSIS — I10 Essential (primary) hypertension: Secondary | ICD-10-CM | POA: Diagnosis not present

## 2018-05-26 DIAGNOSIS — I10 Essential (primary) hypertension: Secondary | ICD-10-CM | POA: Diagnosis not present

## 2018-05-26 DIAGNOSIS — F0280 Dementia in other diseases classified elsewhere without behavioral disturbance: Secondary | ICD-10-CM | POA: Diagnosis not present

## 2018-05-26 DIAGNOSIS — R2681 Unsteadiness on feet: Secondary | ICD-10-CM | POA: Diagnosis not present

## 2018-05-26 DIAGNOSIS — F419 Anxiety disorder, unspecified: Secondary | ICD-10-CM | POA: Diagnosis not present

## 2018-05-26 DIAGNOSIS — I4510 Unspecified right bundle-branch block: Secondary | ICD-10-CM | POA: Diagnosis not present

## 2018-05-26 DIAGNOSIS — G309 Alzheimer's disease, unspecified: Secondary | ICD-10-CM | POA: Diagnosis not present

## 2018-05-27 DIAGNOSIS — I10 Essential (primary) hypertension: Secondary | ICD-10-CM | POA: Diagnosis not present

## 2018-05-27 DIAGNOSIS — G309 Alzheimer's disease, unspecified: Secondary | ICD-10-CM | POA: Diagnosis not present

## 2018-05-27 DIAGNOSIS — I4510 Unspecified right bundle-branch block: Secondary | ICD-10-CM | POA: Diagnosis not present

## 2018-05-27 DIAGNOSIS — F419 Anxiety disorder, unspecified: Secondary | ICD-10-CM | POA: Diagnosis not present

## 2018-05-27 DIAGNOSIS — F0280 Dementia in other diseases classified elsewhere without behavioral disturbance: Secondary | ICD-10-CM | POA: Diagnosis not present

## 2018-05-27 DIAGNOSIS — R2681 Unsteadiness on feet: Secondary | ICD-10-CM | POA: Diagnosis not present

## 2018-05-28 DIAGNOSIS — I4510 Unspecified right bundle-branch block: Secondary | ICD-10-CM | POA: Diagnosis not present

## 2018-05-28 DIAGNOSIS — R2681 Unsteadiness on feet: Secondary | ICD-10-CM | POA: Diagnosis not present

## 2018-05-28 DIAGNOSIS — I10 Essential (primary) hypertension: Secondary | ICD-10-CM | POA: Diagnosis not present

## 2018-05-28 DIAGNOSIS — F419 Anxiety disorder, unspecified: Secondary | ICD-10-CM | POA: Diagnosis not present

## 2018-05-28 DIAGNOSIS — F0280 Dementia in other diseases classified elsewhere without behavioral disturbance: Secondary | ICD-10-CM | POA: Diagnosis not present

## 2018-05-28 DIAGNOSIS — N390 Urinary tract infection, site not specified: Secondary | ICD-10-CM | POA: Diagnosis not present

## 2018-05-28 DIAGNOSIS — G309 Alzheimer's disease, unspecified: Secondary | ICD-10-CM | POA: Diagnosis not present

## 2018-06-01 DIAGNOSIS — R2681 Unsteadiness on feet: Secondary | ICD-10-CM | POA: Diagnosis not present

## 2018-06-01 DIAGNOSIS — G309 Alzheimer's disease, unspecified: Secondary | ICD-10-CM | POA: Diagnosis not present

## 2018-06-01 DIAGNOSIS — I10 Essential (primary) hypertension: Secondary | ICD-10-CM | POA: Diagnosis not present

## 2018-06-01 DIAGNOSIS — F419 Anxiety disorder, unspecified: Secondary | ICD-10-CM | POA: Diagnosis not present

## 2018-06-01 DIAGNOSIS — I4510 Unspecified right bundle-branch block: Secondary | ICD-10-CM | POA: Diagnosis not present

## 2018-06-01 DIAGNOSIS — F0280 Dementia in other diseases classified elsewhere without behavioral disturbance: Secondary | ICD-10-CM | POA: Diagnosis not present

## 2018-06-04 DIAGNOSIS — G309 Alzheimer's disease, unspecified: Secondary | ICD-10-CM | POA: Diagnosis not present

## 2018-06-04 DIAGNOSIS — F0280 Dementia in other diseases classified elsewhere without behavioral disturbance: Secondary | ICD-10-CM | POA: Diagnosis not present

## 2018-06-04 DIAGNOSIS — I4510 Unspecified right bundle-branch block: Secondary | ICD-10-CM | POA: Diagnosis not present

## 2018-06-04 DIAGNOSIS — I10 Essential (primary) hypertension: Secondary | ICD-10-CM | POA: Diagnosis not present

## 2018-06-04 DIAGNOSIS — R2681 Unsteadiness on feet: Secondary | ICD-10-CM | POA: Diagnosis not present

## 2018-06-04 DIAGNOSIS — F419 Anxiety disorder, unspecified: Secondary | ICD-10-CM | POA: Diagnosis not present

## 2018-06-08 DIAGNOSIS — G309 Alzheimer's disease, unspecified: Secondary | ICD-10-CM | POA: Diagnosis not present

## 2018-06-08 DIAGNOSIS — F419 Anxiety disorder, unspecified: Secondary | ICD-10-CM | POA: Diagnosis not present

## 2018-06-08 DIAGNOSIS — R2681 Unsteadiness on feet: Secondary | ICD-10-CM | POA: Diagnosis not present

## 2018-06-08 DIAGNOSIS — F0280 Dementia in other diseases classified elsewhere without behavioral disturbance: Secondary | ICD-10-CM | POA: Diagnosis not present

## 2018-06-08 DIAGNOSIS — I10 Essential (primary) hypertension: Secondary | ICD-10-CM | POA: Diagnosis not present

## 2018-06-08 DIAGNOSIS — N3000 Acute cystitis without hematuria: Secondary | ICD-10-CM | POA: Diagnosis not present

## 2018-06-08 DIAGNOSIS — I4510 Unspecified right bundle-branch block: Secondary | ICD-10-CM | POA: Diagnosis not present

## 2018-06-09 DIAGNOSIS — I4510 Unspecified right bundle-branch block: Secondary | ICD-10-CM | POA: Diagnosis not present

## 2018-06-09 DIAGNOSIS — F0280 Dementia in other diseases classified elsewhere without behavioral disturbance: Secondary | ICD-10-CM | POA: Diagnosis not present

## 2018-06-09 DIAGNOSIS — G309 Alzheimer's disease, unspecified: Secondary | ICD-10-CM | POA: Diagnosis not present

## 2018-06-09 DIAGNOSIS — F419 Anxiety disorder, unspecified: Secondary | ICD-10-CM | POA: Diagnosis not present

## 2018-06-09 DIAGNOSIS — I10 Essential (primary) hypertension: Secondary | ICD-10-CM | POA: Diagnosis not present

## 2018-06-09 DIAGNOSIS — R2681 Unsteadiness on feet: Secondary | ICD-10-CM | POA: Diagnosis not present

## 2018-06-10 DIAGNOSIS — F0280 Dementia in other diseases classified elsewhere without behavioral disturbance: Secondary | ICD-10-CM | POA: Diagnosis not present

## 2018-06-10 DIAGNOSIS — I4510 Unspecified right bundle-branch block: Secondary | ICD-10-CM | POA: Diagnosis not present

## 2018-06-10 DIAGNOSIS — G309 Alzheimer's disease, unspecified: Secondary | ICD-10-CM | POA: Diagnosis not present

## 2018-06-10 DIAGNOSIS — F419 Anxiety disorder, unspecified: Secondary | ICD-10-CM | POA: Diagnosis not present

## 2018-06-10 DIAGNOSIS — R2681 Unsteadiness on feet: Secondary | ICD-10-CM | POA: Diagnosis not present

## 2018-06-10 DIAGNOSIS — I10 Essential (primary) hypertension: Secondary | ICD-10-CM | POA: Diagnosis not present

## 2018-06-13 ENCOUNTER — Emergency Department (HOSPITAL_COMMUNITY)
Admission: EM | Admit: 2018-06-13 | Discharge: 2018-06-13 | Disposition: A | Discharge status: Home / Self Care | Payer: Medicare Other | Attending: Emergency Medicine | Admitting: Emergency Medicine

## 2018-06-13 ENCOUNTER — Emergency Department (HOSPITAL_COMMUNITY): Payer: Medicare Other

## 2018-06-13 ENCOUNTER — Encounter (HOSPITAL_COMMUNITY): Payer: Self-pay

## 2018-06-13 ENCOUNTER — Other Ambulatory Visit: Payer: Self-pay

## 2018-06-13 DIAGNOSIS — M545 Low back pain: Secondary | ICD-10-CM | POA: Diagnosis not present

## 2018-06-13 DIAGNOSIS — Z79899 Other long term (current) drug therapy: Secondary | ICD-10-CM | POA: Insufficient documentation

## 2018-06-13 DIAGNOSIS — M549 Dorsalgia, unspecified: Secondary | ICD-10-CM | POA: Diagnosis not present

## 2018-06-13 DIAGNOSIS — W19XXXA Unspecified fall, initial encounter: Secondary | ICD-10-CM

## 2018-06-13 DIAGNOSIS — R110 Nausea: Secondary | ICD-10-CM | POA: Diagnosis not present

## 2018-06-13 DIAGNOSIS — I10 Essential (primary) hypertension: Secondary | ICD-10-CM | POA: Diagnosis not present

## 2018-06-13 DIAGNOSIS — F0390 Unspecified dementia without behavioral disturbance: Secondary | ICD-10-CM | POA: Diagnosis not present

## 2018-06-13 NOTE — ED Notes (Signed)
RN called [name] to let them know that pt is being discharged. They stated that they would send someone now to pick her up.

## 2018-06-13 NOTE — ED Provider Notes (Signed)
[facility] EMERGENCY DEPARTMENT Provider Note   CSN: [id number] Arrival date & time: 06/13/18  0413    History   Chief Complaint Chief Complaint  Patient presents with   Fall    back pain    HPI [name] is a 85 y.o. female.     Patient was found on the floor at the nursing home.  She complains of low back discomfort  The history is provided by the patient. No language interpreter was used.  Fall  This is a new problem. The current episode started 12 to 24 hours ago. The problem occurs rarely. The problem has been resolved. Pertinent negatives include no chest pain, no abdominal pain and no headaches. Nothing aggravates the symptoms. She has tried nothing for the symptoms. The treatment provided no relief.    Past Medical History:  Diagnosis Date   Anxiety    Dementia (HCC)    GERD (gastroesophageal reflux disease)    Goiter    Hypertension    Osteoarthritis    Osteoporosis    Vitamin D insufficiency     Patient Active Problem List   Diagnosis Date Noted   Chest pain 05/14/2016   Goiter 05/14/2016   Intertrochanteric fracture of right hip (HCC) 11/19/2012   UTI (urinary tract infection) 11/19/2012   Dizziness and giddiness 06/14/2012   HTN (hypertension) 06/14/2012   Difficulty in walking(719.7) 09/09/2011   Osteoarthritis of leg 09/09/2011   Abnormality of gait 09/09/2011   Pain in joint, lower leg 09/09/2011    Past Surgical History:  Procedure Laterality Date   APPENDECTOMY     KIDNEY STONE SURGERY     ORIF HIP FRACTURE Right 11/20/2012   Procedure: OPEN REDUCTION INTERNAL FIXATION RIGHT HIP;  Surgeon: Darreld McleanWayne Keeling, MD;  Location: [facility];  Service: Orthopedics;  Laterality: Right;   stress fractures of legs     TUBAL LIGATION       OB History   No obstetric history on file.      Home Medications    Prior to Admission medications   Medication Sig Start Date End Date Taking? Authorizing Provider  acetaminophen  (TYLENOL) 500 MG tablet Take 1,000 mg by mouth 3 (three) times daily. For pain    [provider]  amLODipine (NORVASC) 5 MG tablet Take 1 tablet (5 mg total) by mouth at bedtime. 05/15/16   Carylon PerchesFagan, Roy, MD  dextromethorphan-guaiFENesin (TUSSIN DM) 10-100 MG/5ML liquid Take 10 mLs by mouth every 6 (six) hours as needed for cough.    [provider]  donepezil (ARICEPT) 5 MG tablet Take 1 tablet (5 mg total) by mouth at bedtime. 05/15/16   Carylon PerchesFagan, Roy, MD  hydrochlorothiazide (MICROZIDE) 12.5 MG capsule Take 12.5 mg by mouth every morning.    [provider]  loperamide (IMODIUM) 2 MG capsule Take 2 mg by mouth every 4 (four) hours as needed for diarrhea or loose stools.    [provider]  losartan (COZAAR) 100 MG tablet Take 100 mg by mouth daily.    [provider]  omeprazole (PRILOSEC) 40 MG capsule Take 40 mg by mouth daily.    [provider]    Family History Family History  Problem Relation Age of Onset   Heart attack Father    Heart disease Neg Hx     Social History Social History   Tobacco Use   Smoking status: Never Smoker   Smokeless tobacco: Never Used  Substance Use Topics   Alcohol use: No  Drug use: No     Allergies   Hydrocodone-acetaminophen   Review of Systems Review of Systems  Constitutional: Negative for appetite change and fatigue.  HENT: Negative for congestion, ear discharge and sinus pressure.   Eyes: Negative for discharge.  Respiratory: Negative for cough.   Cardiovascular: Negative for chest pain.  Gastrointestinal: Negative for abdominal pain and diarrhea.  Genitourinary: Negative for frequency and hematuria.  Musculoskeletal: Negative for back pain.       Low back pain  Skin: Negative for rash.  Neurological: Negative for seizures and headaches.  Psychiatric/Behavioral: Negative for hallucinations.     Physical Exam Updated Vital Signs BP (!) 151/71    Pulse 80    Resp (!) 21     Ht  (1.651 m)    Wt 59 kg    SpO2 (!) 87%    BMI 21.63 kg/m   Physical Exam Vitals signs and nursing note reviewed.  Constitutional:      Appearance: She is well-developed.  HENT:     Head: Normocephalic.     Nose: Nose normal.  Eyes:     General: No scleral icterus.    Conjunctiva/sclera: Conjunctivae normal.  Neck:     Musculoskeletal: Neck supple.     Thyroid: No thyromegaly.  Cardiovascular:     Rate and Rhythm: Normal rate and regular rhythm.     Heart sounds: No murmur. No friction rub. No gallop.   Pulmonary:     Breath sounds: No stridor. No wheezing or rales.  Chest:     Chest wall: No tenderness.  Abdominal:     General: There is no distension.     Tenderness: There is no abdominal tenderness. There is no rebound.  Musculoskeletal: Normal range of motion.     Comments: Minimal tenderness to distal lumbar spine  Lymphadenopathy:     Cervical: No cervical adenopathy.  Skin:    Findings: No erythema or rash.  Neurological:     Mental Status: She is oriented to person, place, and time.     Motor: No abnormal muscle tone.     Coordination: Coordination normal.  Psychiatric:        Behavior: Behavior normal.      ED Treatments / Results  Labs (all labs ordered are listed, but only abnormal results are displayed) Labs Reviewed - No data to display  EKG None  Radiology Dg Lumbar Spine Complete  Result Date: 06/13/2018 CLINICAL DATA:  Nursing home patient found on floor sharp constant bilateral back pain, nausea without vomiting. Staff at facility found pt on the floor, unknown how long she had been on the floor, c/o back pain and nauseafall EXAM: LUMBAR SPINE - COMPLETE 4+ VIEW COMPARISON:  Radiograph 05/14/2016 FINDINGS: Chronic compression deformity of the L1 vertebral body with approximately 15% loss vertebral body height. No subluxation. No additional evidence of lumbar spine fracture. Endplate deformities at L3 favored degenerative. Atherosclerotic  calcification of the aorta. IMPRESSION: 1. No acute findings lumbar spine. 2. Chronic compression deformity at L1. Electronically Signed   By: Genevive Bi M.D.   On: 06/13/2018 05:51   Dg Pelvis 1-2 Views  Result Date: 06/13/2018 CLINICAL DATA:  Nursing home patient found on floor sharp constant bilateral back pain, nausea without vomiting. Staff at facility found pt on the floor, unknown how long she had been on the floor, c/o back pain and nauseafall EXAM: PELVIS - 1-2 VIEW COMPARISON:  07/18/2017 FINDINGS: Internal fixation RIGHT femur. No evidence of acute  fracture in the pelvis or hips. IMPRESSION: No acute osseous abnormality. Electronically Signed   By: Genevive Bi M.D.   On: 06/13/2018 05:46    Procedures Procedures (including critical care time)  Medications Ordered in ED Medications - No data to display   Initial Impression / Assessment and Plan / ED Course  I have reviewed the triage vital signs and the nursing notes.  Pertinent labs & imaging results that were available during my care of the patient were reviewed by me and considered in my medical decision making (see chart for details).        Lumbar spine and pelvic x-rays negative.  Patient with mild contusion to lumbar spine.  She will take Tylenol follow-up with her PCP  Final Clinical Impressions(s) / ED Diagnoses   Final diagnoses:  Fall, initial encounter    ED Discharge Orders    None       Bethann Berkshire, MD 06/13/18 [phone]

## 2018-06-13 NOTE — ED Triage Notes (Signed)
Pt is from [facility], arrives via [facility] after questionable fall.  Staff at facility found pt on the floor, unknown how long she had been on the floor, c/o back pain and nausea.

## 2018-06-13 NOTE — ED Notes (Signed)
Patient is resting comfortably. 

## 2018-06-13 NOTE — Discharge Instructions (Addendum)
Take tylenol for pain and follow up as needed

## 2018-06-14 DIAGNOSIS — F419 Anxiety disorder, unspecified: Secondary | ICD-10-CM | POA: Diagnosis not present

## 2018-06-14 DIAGNOSIS — I10 Essential (primary) hypertension: Secondary | ICD-10-CM | POA: Diagnosis not present

## 2018-06-14 DIAGNOSIS — F0280 Dementia in other diseases classified elsewhere without behavioral disturbance: Secondary | ICD-10-CM | POA: Diagnosis not present

## 2018-06-14 DIAGNOSIS — R2681 Unsteadiness on feet: Secondary | ICD-10-CM | POA: Diagnosis not present

## 2018-06-14 DIAGNOSIS — G309 Alzheimer's disease, unspecified: Secondary | ICD-10-CM | POA: Diagnosis not present

## 2018-06-14 DIAGNOSIS — I4510 Unspecified right bundle-branch block: Secondary | ICD-10-CM | POA: Diagnosis not present

## 2018-06-15 DIAGNOSIS — F419 Anxiety disorder, unspecified: Secondary | ICD-10-CM | POA: Diagnosis not present

## 2018-06-15 DIAGNOSIS — I4510 Unspecified right bundle-branch block: Secondary | ICD-10-CM | POA: Diagnosis not present

## 2018-06-15 DIAGNOSIS — I10 Essential (primary) hypertension: Secondary | ICD-10-CM | POA: Diagnosis not present

## 2018-06-15 DIAGNOSIS — G309 Alzheimer's disease, unspecified: Secondary | ICD-10-CM | POA: Diagnosis not present

## 2018-06-15 DIAGNOSIS — R2681 Unsteadiness on feet: Secondary | ICD-10-CM | POA: Diagnosis not present

## 2018-06-15 DIAGNOSIS — F0280 Dementia in other diseases classified elsewhere without behavioral disturbance: Secondary | ICD-10-CM | POA: Diagnosis not present

## 2018-06-17 DIAGNOSIS — I10 Essential (primary) hypertension: Secondary | ICD-10-CM | POA: Diagnosis not present

## 2018-06-17 DIAGNOSIS — F0280 Dementia in other diseases classified elsewhere without behavioral disturbance: Secondary | ICD-10-CM | POA: Diagnosis not present

## 2018-06-17 DIAGNOSIS — F419 Anxiety disorder, unspecified: Secondary | ICD-10-CM | POA: Diagnosis not present

## 2018-06-17 DIAGNOSIS — R2681 Unsteadiness on feet: Secondary | ICD-10-CM | POA: Diagnosis not present

## 2018-06-17 DIAGNOSIS — I4510 Unspecified right bundle-branch block: Secondary | ICD-10-CM | POA: Diagnosis not present

## 2018-06-17 DIAGNOSIS — G309 Alzheimer's disease, unspecified: Secondary | ICD-10-CM | POA: Diagnosis not present

## 2018-06-18 DIAGNOSIS — M79674 Pain in right toe(s): Secondary | ICD-10-CM | POA: Diagnosis not present

## 2018-06-18 DIAGNOSIS — B351 Tinea unguium: Secondary | ICD-10-CM | POA: Diagnosis not present

## 2018-06-18 DIAGNOSIS — M79675 Pain in left toe(s): Secondary | ICD-10-CM | POA: Diagnosis not present

## 2018-06-22 DIAGNOSIS — I10 Essential (primary) hypertension: Secondary | ICD-10-CM | POA: Diagnosis not present

## 2018-06-22 DIAGNOSIS — I4510 Unspecified right bundle-branch block: Secondary | ICD-10-CM | POA: Diagnosis not present

## 2018-06-22 DIAGNOSIS — R2681 Unsteadiness on feet: Secondary | ICD-10-CM | POA: Diagnosis not present

## 2018-06-22 DIAGNOSIS — F0280 Dementia in other diseases classified elsewhere without behavioral disturbance: Secondary | ICD-10-CM | POA: Diagnosis not present

## 2018-06-22 DIAGNOSIS — F419 Anxiety disorder, unspecified: Secondary | ICD-10-CM | POA: Diagnosis not present

## 2018-06-22 DIAGNOSIS — G309 Alzheimer's disease, unspecified: Secondary | ICD-10-CM | POA: Diagnosis not present

## 2018-06-24 DIAGNOSIS — G309 Alzheimer's disease, unspecified: Secondary | ICD-10-CM | POA: Diagnosis not present

## 2018-06-24 DIAGNOSIS — I4510 Unspecified right bundle-branch block: Secondary | ICD-10-CM | POA: Diagnosis not present

## 2018-06-24 DIAGNOSIS — F0280 Dementia in other diseases classified elsewhere without behavioral disturbance: Secondary | ICD-10-CM | POA: Diagnosis not present

## 2018-06-24 DIAGNOSIS — I10 Essential (primary) hypertension: Secondary | ICD-10-CM | POA: Diagnosis not present

## 2018-06-24 DIAGNOSIS — F419 Anxiety disorder, unspecified: Secondary | ICD-10-CM | POA: Diagnosis not present

## 2018-06-24 DIAGNOSIS — R2681 Unsteadiness on feet: Secondary | ICD-10-CM | POA: Diagnosis not present

## 2018-07-01 DIAGNOSIS — I10 Essential (primary) hypertension: Secondary | ICD-10-CM | POA: Diagnosis not present

## 2018-07-01 DIAGNOSIS — G309 Alzheimer's disease, unspecified: Secondary | ICD-10-CM | POA: Diagnosis not present

## 2018-07-01 DIAGNOSIS — F419 Anxiety disorder, unspecified: Secondary | ICD-10-CM | POA: Diagnosis not present

## 2018-07-01 DIAGNOSIS — R2681 Unsteadiness on feet: Secondary | ICD-10-CM | POA: Diagnosis not present

## 2018-07-01 DIAGNOSIS — I4510 Unspecified right bundle-branch block: Secondary | ICD-10-CM | POA: Diagnosis not present

## 2018-07-01 DIAGNOSIS — F0280 Dementia in other diseases classified elsewhere without behavioral disturbance: Secondary | ICD-10-CM | POA: Diagnosis not present

## 2018-07-03 DIAGNOSIS — I4510 Unspecified right bundle-branch block: Secondary | ICD-10-CM | POA: Diagnosis not present

## 2018-07-03 DIAGNOSIS — I10 Essential (primary) hypertension: Secondary | ICD-10-CM | POA: Diagnosis not present

## 2018-07-03 DIAGNOSIS — F0280 Dementia in other diseases classified elsewhere without behavioral disturbance: Secondary | ICD-10-CM | POA: Diagnosis not present

## 2018-07-03 DIAGNOSIS — R2681 Unsteadiness on feet: Secondary | ICD-10-CM | POA: Diagnosis not present

## 2018-07-03 DIAGNOSIS — G309 Alzheimer's disease, unspecified: Secondary | ICD-10-CM | POA: Diagnosis not present

## 2018-07-03 DIAGNOSIS — F419 Anxiety disorder, unspecified: Secondary | ICD-10-CM | POA: Diagnosis not present

## 2018-07-06 DIAGNOSIS — I4510 Unspecified right bundle-branch block: Secondary | ICD-10-CM | POA: Diagnosis not present

## 2018-07-06 DIAGNOSIS — G309 Alzheimer's disease, unspecified: Secondary | ICD-10-CM | POA: Diagnosis not present

## 2018-07-06 DIAGNOSIS — R2681 Unsteadiness on feet: Secondary | ICD-10-CM | POA: Diagnosis not present

## 2018-07-06 DIAGNOSIS — F0280 Dementia in other diseases classified elsewhere without behavioral disturbance: Secondary | ICD-10-CM | POA: Diagnosis not present

## 2018-07-06 DIAGNOSIS — I10 Essential (primary) hypertension: Secondary | ICD-10-CM | POA: Diagnosis not present

## 2018-07-06 DIAGNOSIS — F419 Anxiety disorder, unspecified: Secondary | ICD-10-CM | POA: Diagnosis not present

## 2018-07-08 DIAGNOSIS — I10 Essential (primary) hypertension: Secondary | ICD-10-CM | POA: Diagnosis not present

## 2018-07-08 DIAGNOSIS — F419 Anxiety disorder, unspecified: Secondary | ICD-10-CM | POA: Diagnosis not present

## 2018-07-08 DIAGNOSIS — I4510 Unspecified right bundle-branch block: Secondary | ICD-10-CM | POA: Diagnosis not present

## 2018-07-08 DIAGNOSIS — R2681 Unsteadiness on feet: Secondary | ICD-10-CM | POA: Diagnosis not present

## 2018-07-08 DIAGNOSIS — F0280 Dementia in other diseases classified elsewhere without behavioral disturbance: Secondary | ICD-10-CM | POA: Diagnosis not present

## 2018-07-08 DIAGNOSIS — G309 Alzheimer's disease, unspecified: Secondary | ICD-10-CM | POA: Diagnosis not present

## 2018-07-13 DIAGNOSIS — I10 Essential (primary) hypertension: Secondary | ICD-10-CM | POA: Diagnosis not present

## 2018-07-13 DIAGNOSIS — G309 Alzheimer's disease, unspecified: Secondary | ICD-10-CM | POA: Diagnosis not present

## 2018-07-13 DIAGNOSIS — F419 Anxiety disorder, unspecified: Secondary | ICD-10-CM | POA: Diagnosis not present

## 2018-07-13 DIAGNOSIS — I4510 Unspecified right bundle-branch block: Secondary | ICD-10-CM | POA: Diagnosis not present

## 2018-07-13 DIAGNOSIS — R2681 Unsteadiness on feet: Secondary | ICD-10-CM | POA: Diagnosis not present

## 2018-07-13 DIAGNOSIS — F0280 Dementia in other diseases classified elsewhere without behavioral disturbance: Secondary | ICD-10-CM | POA: Diagnosis not present

## 2018-07-14 DIAGNOSIS — F0280 Dementia in other diseases classified elsewhere without behavioral disturbance: Secondary | ICD-10-CM | POA: Diagnosis not present

## 2018-07-14 DIAGNOSIS — R2681 Unsteadiness on feet: Secondary | ICD-10-CM | POA: Diagnosis not present

## 2018-07-14 DIAGNOSIS — G309 Alzheimer's disease, unspecified: Secondary | ICD-10-CM | POA: Diagnosis not present

## 2018-07-14 DIAGNOSIS — F419 Anxiety disorder, unspecified: Secondary | ICD-10-CM | POA: Diagnosis not present

## 2018-07-14 DIAGNOSIS — I4510 Unspecified right bundle-branch block: Secondary | ICD-10-CM | POA: Diagnosis not present

## 2018-07-14 DIAGNOSIS — I10 Essential (primary) hypertension: Secondary | ICD-10-CM | POA: Diagnosis not present

## 2018-07-15 DIAGNOSIS — G309 Alzheimer's disease, unspecified: Secondary | ICD-10-CM | POA: Diagnosis not present

## 2018-07-15 DIAGNOSIS — I10 Essential (primary) hypertension: Secondary | ICD-10-CM | POA: Diagnosis not present

## 2018-07-15 DIAGNOSIS — R2681 Unsteadiness on feet: Secondary | ICD-10-CM | POA: Diagnosis not present

## 2018-07-15 DIAGNOSIS — I4510 Unspecified right bundle-branch block: Secondary | ICD-10-CM | POA: Diagnosis not present

## 2018-07-15 DIAGNOSIS — F0280 Dementia in other diseases classified elsewhere without behavioral disturbance: Secondary | ICD-10-CM | POA: Diagnosis not present

## 2018-07-15 DIAGNOSIS — F419 Anxiety disorder, unspecified: Secondary | ICD-10-CM | POA: Diagnosis not present

## 2018-07-16 DIAGNOSIS — G309 Alzheimer's disease, unspecified: Secondary | ICD-10-CM | POA: Diagnosis not present

## 2018-07-16 DIAGNOSIS — I4510 Unspecified right bundle-branch block: Secondary | ICD-10-CM | POA: Diagnosis not present

## 2018-07-16 DIAGNOSIS — F419 Anxiety disorder, unspecified: Secondary | ICD-10-CM | POA: Diagnosis not present

## 2018-07-16 DIAGNOSIS — I10 Essential (primary) hypertension: Secondary | ICD-10-CM | POA: Diagnosis not present

## 2018-07-16 DIAGNOSIS — F0280 Dementia in other diseases classified elsewhere without behavioral disturbance: Secondary | ICD-10-CM | POA: Diagnosis not present

## 2018-07-16 DIAGNOSIS — R2681 Unsteadiness on feet: Secondary | ICD-10-CM | POA: Diagnosis not present

## 2018-07-20 DIAGNOSIS — G309 Alzheimer's disease, unspecified: Secondary | ICD-10-CM | POA: Diagnosis not present

## 2018-07-20 DIAGNOSIS — R2681 Unsteadiness on feet: Secondary | ICD-10-CM | POA: Diagnosis not present

## 2018-07-20 DIAGNOSIS — F0280 Dementia in other diseases classified elsewhere without behavioral disturbance: Secondary | ICD-10-CM | POA: Diagnosis not present

## 2018-07-20 DIAGNOSIS — I10 Essential (primary) hypertension: Secondary | ICD-10-CM | POA: Diagnosis not present

## 2018-07-20 DIAGNOSIS — I4510 Unspecified right bundle-branch block: Secondary | ICD-10-CM | POA: Diagnosis not present

## 2018-07-20 DIAGNOSIS — F419 Anxiety disorder, unspecified: Secondary | ICD-10-CM | POA: Diagnosis not present

## 2018-07-21 DIAGNOSIS — I10 Essential (primary) hypertension: Secondary | ICD-10-CM | POA: Diagnosis not present

## 2018-07-21 DIAGNOSIS — F419 Anxiety disorder, unspecified: Secondary | ICD-10-CM | POA: Diagnosis not present

## 2018-07-21 DIAGNOSIS — G309 Alzheimer's disease, unspecified: Secondary | ICD-10-CM | POA: Diagnosis not present

## 2018-07-21 DIAGNOSIS — I4510 Unspecified right bundle-branch block: Secondary | ICD-10-CM | POA: Diagnosis not present

## 2018-07-21 DIAGNOSIS — R2681 Unsteadiness on feet: Secondary | ICD-10-CM | POA: Diagnosis not present

## 2018-07-21 DIAGNOSIS — F0280 Dementia in other diseases classified elsewhere without behavioral disturbance: Secondary | ICD-10-CM | POA: Diagnosis not present

## 2018-07-27 DIAGNOSIS — I10 Essential (primary) hypertension: Secondary | ICD-10-CM | POA: Diagnosis not present

## 2018-07-27 DIAGNOSIS — F0280 Dementia in other diseases classified elsewhere without behavioral disturbance: Secondary | ICD-10-CM | POA: Diagnosis not present

## 2018-07-27 DIAGNOSIS — R2681 Unsteadiness on feet: Secondary | ICD-10-CM | POA: Diagnosis not present

## 2018-07-27 DIAGNOSIS — I4510 Unspecified right bundle-branch block: Secondary | ICD-10-CM | POA: Diagnosis not present

## 2018-07-27 DIAGNOSIS — F419 Anxiety disorder, unspecified: Secondary | ICD-10-CM | POA: Diagnosis not present

## 2018-07-27 DIAGNOSIS — G309 Alzheimer's disease, unspecified: Secondary | ICD-10-CM | POA: Diagnosis not present

## 2018-07-29 DIAGNOSIS — R2681 Unsteadiness on feet: Secondary | ICD-10-CM | POA: Diagnosis not present

## 2018-07-29 DIAGNOSIS — I4510 Unspecified right bundle-branch block: Secondary | ICD-10-CM | POA: Diagnosis not present

## 2018-07-29 DIAGNOSIS — I10 Essential (primary) hypertension: Secondary | ICD-10-CM | POA: Diagnosis not present

## 2018-07-29 DIAGNOSIS — F0280 Dementia in other diseases classified elsewhere without behavioral disturbance: Secondary | ICD-10-CM | POA: Diagnosis not present

## 2018-07-29 DIAGNOSIS — F419 Anxiety disorder, unspecified: Secondary | ICD-10-CM | POA: Diagnosis not present

## 2018-07-29 DIAGNOSIS — G309 Alzheimer's disease, unspecified: Secondary | ICD-10-CM | POA: Diagnosis not present

## 2018-08-03 DIAGNOSIS — F419 Anxiety disorder, unspecified: Secondary | ICD-10-CM | POA: Diagnosis not present

## 2018-08-03 DIAGNOSIS — R2681 Unsteadiness on feet: Secondary | ICD-10-CM | POA: Diagnosis not present

## 2018-08-03 DIAGNOSIS — F0280 Dementia in other diseases classified elsewhere without behavioral disturbance: Secondary | ICD-10-CM | POA: Diagnosis not present

## 2018-08-03 DIAGNOSIS — G309 Alzheimer's disease, unspecified: Secondary | ICD-10-CM | POA: Diagnosis not present

## 2018-08-03 DIAGNOSIS — I4510 Unspecified right bundle-branch block: Secondary | ICD-10-CM | POA: Diagnosis not present

## 2018-08-03 DIAGNOSIS — I10 Essential (primary) hypertension: Secondary | ICD-10-CM | POA: Diagnosis not present

## 2018-08-05 DIAGNOSIS — I10 Essential (primary) hypertension: Secondary | ICD-10-CM | POA: Diagnosis not present

## 2018-08-05 DIAGNOSIS — F419 Anxiety disorder, unspecified: Secondary | ICD-10-CM | POA: Diagnosis not present

## 2018-08-05 DIAGNOSIS — I4510 Unspecified right bundle-branch block: Secondary | ICD-10-CM | POA: Diagnosis not present

## 2018-08-05 DIAGNOSIS — G309 Alzheimer's disease, unspecified: Secondary | ICD-10-CM | POA: Diagnosis not present

## 2018-08-05 DIAGNOSIS — R2681 Unsteadiness on feet: Secondary | ICD-10-CM | POA: Diagnosis not present

## 2018-08-05 DIAGNOSIS — F0280 Dementia in other diseases classified elsewhere without behavioral disturbance: Secondary | ICD-10-CM | POA: Diagnosis not present

## 2018-08-10 DIAGNOSIS — G309 Alzheimer's disease, unspecified: Secondary | ICD-10-CM | POA: Diagnosis not present

## 2018-08-10 DIAGNOSIS — I10 Essential (primary) hypertension: Secondary | ICD-10-CM | POA: Diagnosis not present

## 2018-08-10 DIAGNOSIS — F419 Anxiety disorder, unspecified: Secondary | ICD-10-CM | POA: Diagnosis not present

## 2018-08-10 DIAGNOSIS — R2681 Unsteadiness on feet: Secondary | ICD-10-CM | POA: Diagnosis not present

## 2018-08-10 DIAGNOSIS — I4510 Unspecified right bundle-branch block: Secondary | ICD-10-CM | POA: Diagnosis not present

## 2018-08-10 DIAGNOSIS — F0280 Dementia in other diseases classified elsewhere without behavioral disturbance: Secondary | ICD-10-CM | POA: Diagnosis not present

## 2018-08-12 DIAGNOSIS — F0280 Dementia in other diseases classified elsewhere without behavioral disturbance: Secondary | ICD-10-CM | POA: Diagnosis not present

## 2018-08-12 DIAGNOSIS — R278 Other lack of coordination: Secondary | ICD-10-CM | POA: Diagnosis not present

## 2018-08-13 DIAGNOSIS — F419 Anxiety disorder, unspecified: Secondary | ICD-10-CM | POA: Diagnosis not present

## 2018-08-13 DIAGNOSIS — R2681 Unsteadiness on feet: Secondary | ICD-10-CM | POA: Diagnosis not present

## 2018-08-13 DIAGNOSIS — G309 Alzheimer's disease, unspecified: Secondary | ICD-10-CM | POA: Diagnosis not present

## 2018-08-13 DIAGNOSIS — I4510 Unspecified right bundle-branch block: Secondary | ICD-10-CM | POA: Diagnosis not present

## 2018-08-13 DIAGNOSIS — I10 Essential (primary) hypertension: Secondary | ICD-10-CM | POA: Diagnosis not present

## 2018-08-13 DIAGNOSIS — F0280 Dementia in other diseases classified elsewhere without behavioral disturbance: Secondary | ICD-10-CM | POA: Diagnosis not present

## 2018-08-17 DIAGNOSIS — F419 Anxiety disorder, unspecified: Secondary | ICD-10-CM | POA: Diagnosis not present

## 2018-08-17 DIAGNOSIS — F0280 Dementia in other diseases classified elsewhere without behavioral disturbance: Secondary | ICD-10-CM | POA: Diagnosis not present

## 2018-08-17 DIAGNOSIS — R2681 Unsteadiness on feet: Secondary | ICD-10-CM | POA: Diagnosis not present

## 2018-08-17 DIAGNOSIS — I10 Essential (primary) hypertension: Secondary | ICD-10-CM | POA: Diagnosis not present

## 2018-08-17 DIAGNOSIS — I4510 Unspecified right bundle-branch block: Secondary | ICD-10-CM | POA: Diagnosis not present

## 2018-08-17 DIAGNOSIS — G309 Alzheimer's disease, unspecified: Secondary | ICD-10-CM | POA: Diagnosis not present

## 2018-08-19 DIAGNOSIS — I10 Essential (primary) hypertension: Secondary | ICD-10-CM | POA: Diagnosis not present

## 2018-08-19 DIAGNOSIS — I4510 Unspecified right bundle-branch block: Secondary | ICD-10-CM | POA: Diagnosis not present

## 2018-08-19 DIAGNOSIS — G309 Alzheimer's disease, unspecified: Secondary | ICD-10-CM | POA: Diagnosis not present

## 2018-08-19 DIAGNOSIS — F419 Anxiety disorder, unspecified: Secondary | ICD-10-CM | POA: Diagnosis not present

## 2018-08-19 DIAGNOSIS — R2681 Unsteadiness on feet: Secondary | ICD-10-CM | POA: Diagnosis not present

## 2018-08-19 DIAGNOSIS — F0280 Dementia in other diseases classified elsewhere without behavioral disturbance: Secondary | ICD-10-CM | POA: Diagnosis not present

## 2018-08-20 DIAGNOSIS — B351 Tinea unguium: Secondary | ICD-10-CM | POA: Diagnosis not present

## 2018-08-20 DIAGNOSIS — M79675 Pain in left toe(s): Secondary | ICD-10-CM | POA: Diagnosis not present

## 2018-08-20 DIAGNOSIS — M79674 Pain in right toe(s): Secondary | ICD-10-CM | POA: Diagnosis not present

## 2018-08-21 DIAGNOSIS — H25813 Combined forms of age-related cataract, bilateral: Secondary | ICD-10-CM | POA: Diagnosis not present

## 2018-08-24 DIAGNOSIS — F0280 Dementia in other diseases classified elsewhere without behavioral disturbance: Secondary | ICD-10-CM | POA: Diagnosis not present

## 2018-08-24 DIAGNOSIS — R2681 Unsteadiness on feet: Secondary | ICD-10-CM | POA: Diagnosis not present

## 2018-08-24 DIAGNOSIS — I4510 Unspecified right bundle-branch block: Secondary | ICD-10-CM | POA: Diagnosis not present

## 2018-08-24 DIAGNOSIS — I10 Essential (primary) hypertension: Secondary | ICD-10-CM | POA: Diagnosis not present

## 2018-08-24 DIAGNOSIS — F419 Anxiety disorder, unspecified: Secondary | ICD-10-CM | POA: Diagnosis not present

## 2018-08-24 DIAGNOSIS — G309 Alzheimer's disease, unspecified: Secondary | ICD-10-CM | POA: Diagnosis not present

## 2018-08-26 DIAGNOSIS — I4510 Unspecified right bundle-branch block: Secondary | ICD-10-CM | POA: Diagnosis not present

## 2018-08-26 DIAGNOSIS — F419 Anxiety disorder, unspecified: Secondary | ICD-10-CM | POA: Diagnosis not present

## 2018-08-26 DIAGNOSIS — I10 Essential (primary) hypertension: Secondary | ICD-10-CM | POA: Diagnosis not present

## 2018-08-26 DIAGNOSIS — R2681 Unsteadiness on feet: Secondary | ICD-10-CM | POA: Diagnosis not present

## 2018-08-26 DIAGNOSIS — F0280 Dementia in other diseases classified elsewhere without behavioral disturbance: Secondary | ICD-10-CM | POA: Diagnosis not present

## 2018-08-26 DIAGNOSIS — G309 Alzheimer's disease, unspecified: Secondary | ICD-10-CM | POA: Diagnosis not present

## 2018-08-31 DIAGNOSIS — G309 Alzheimer's disease, unspecified: Secondary | ICD-10-CM | POA: Diagnosis not present

## 2018-08-31 DIAGNOSIS — I4510 Unspecified right bundle-branch block: Secondary | ICD-10-CM | POA: Diagnosis not present

## 2018-08-31 DIAGNOSIS — I10 Essential (primary) hypertension: Secondary | ICD-10-CM | POA: Diagnosis not present

## 2018-08-31 DIAGNOSIS — F419 Anxiety disorder, unspecified: Secondary | ICD-10-CM | POA: Diagnosis not present

## 2018-08-31 DIAGNOSIS — F0280 Dementia in other diseases classified elsewhere without behavioral disturbance: Secondary | ICD-10-CM | POA: Diagnosis not present

## 2018-08-31 DIAGNOSIS — R2681 Unsteadiness on feet: Secondary | ICD-10-CM | POA: Diagnosis not present

## 2018-09-02 DIAGNOSIS — I10 Essential (primary) hypertension: Secondary | ICD-10-CM | POA: Diagnosis not present

## 2018-09-02 DIAGNOSIS — F0280 Dementia in other diseases classified elsewhere without behavioral disturbance: Secondary | ICD-10-CM | POA: Diagnosis not present

## 2018-09-02 DIAGNOSIS — R2681 Unsteadiness on feet: Secondary | ICD-10-CM | POA: Diagnosis not present

## 2018-09-02 DIAGNOSIS — G309 Alzheimer's disease, unspecified: Secondary | ICD-10-CM | POA: Diagnosis not present

## 2018-09-02 DIAGNOSIS — F419 Anxiety disorder, unspecified: Secondary | ICD-10-CM | POA: Diagnosis not present

## 2018-09-02 DIAGNOSIS — I4510 Unspecified right bundle-branch block: Secondary | ICD-10-CM | POA: Diagnosis not present

## 2018-09-08 DIAGNOSIS — F419 Anxiety disorder, unspecified: Secondary | ICD-10-CM | POA: Diagnosis not present

## 2018-09-08 DIAGNOSIS — I4510 Unspecified right bundle-branch block: Secondary | ICD-10-CM | POA: Diagnosis not present

## 2018-09-08 DIAGNOSIS — G309 Alzheimer's disease, unspecified: Secondary | ICD-10-CM | POA: Diagnosis not present

## 2018-09-08 DIAGNOSIS — F0280 Dementia in other diseases classified elsewhere without behavioral disturbance: Secondary | ICD-10-CM | POA: Diagnosis not present

## 2018-09-08 DIAGNOSIS — I10 Essential (primary) hypertension: Secondary | ICD-10-CM | POA: Diagnosis not present

## 2018-09-08 DIAGNOSIS — R2681 Unsteadiness on feet: Secondary | ICD-10-CM | POA: Diagnosis not present

## 2018-09-12 DIAGNOSIS — F419 Anxiety disorder, unspecified: Secondary | ICD-10-CM | POA: Diagnosis not present

## 2018-09-12 DIAGNOSIS — I4510 Unspecified right bundle-branch block: Secondary | ICD-10-CM | POA: Diagnosis not present

## 2018-09-12 DIAGNOSIS — R2681 Unsteadiness on feet: Secondary | ICD-10-CM | POA: Diagnosis not present

## 2018-09-12 DIAGNOSIS — I10 Essential (primary) hypertension: Secondary | ICD-10-CM | POA: Diagnosis not present

## 2018-09-12 DIAGNOSIS — F0280 Dementia in other diseases classified elsewhere without behavioral disturbance: Secondary | ICD-10-CM | POA: Diagnosis not present

## 2018-09-12 DIAGNOSIS — G309 Alzheimer's disease, unspecified: Secondary | ICD-10-CM | POA: Diagnosis not present

## 2018-09-14 DIAGNOSIS — G309 Alzheimer's disease, unspecified: Secondary | ICD-10-CM | POA: Diagnosis not present

## 2018-09-14 DIAGNOSIS — E049 Nontoxic goiter, unspecified: Secondary | ICD-10-CM | POA: Diagnosis not present

## 2018-09-14 DIAGNOSIS — I10 Essential (primary) hypertension: Secondary | ICD-10-CM | POA: Diagnosis not present

## 2018-09-14 DIAGNOSIS — Z79899 Other long term (current) drug therapy: Secondary | ICD-10-CM | POA: Diagnosis not present

## 2018-09-15 DIAGNOSIS — R4689 Other symptoms and signs involving appearance and behavior: Secondary | ICD-10-CM | POA: Diagnosis not present

## 2018-09-15 DIAGNOSIS — N3000 Acute cystitis without hematuria: Secondary | ICD-10-CM | POA: Diagnosis not present

## 2018-09-16 DIAGNOSIS — G309 Alzheimer's disease, unspecified: Secondary | ICD-10-CM | POA: Diagnosis not present

## 2018-09-16 DIAGNOSIS — I4510 Unspecified right bundle-branch block: Secondary | ICD-10-CM | POA: Diagnosis not present

## 2018-09-16 DIAGNOSIS — F419 Anxiety disorder, unspecified: Secondary | ICD-10-CM | POA: Diagnosis not present

## 2018-09-16 DIAGNOSIS — I10 Essential (primary) hypertension: Secondary | ICD-10-CM | POA: Diagnosis not present

## 2018-09-16 DIAGNOSIS — F0280 Dementia in other diseases classified elsewhere without behavioral disturbance: Secondary | ICD-10-CM | POA: Diagnosis not present

## 2018-09-16 DIAGNOSIS — R2681 Unsteadiness on feet: Secondary | ICD-10-CM | POA: Diagnosis not present

## 2018-09-17 DIAGNOSIS — G309 Alzheimer's disease, unspecified: Secondary | ICD-10-CM | POA: Diagnosis not present

## 2018-09-17 DIAGNOSIS — I10 Essential (primary) hypertension: Secondary | ICD-10-CM | POA: Diagnosis not present

## 2018-09-17 DIAGNOSIS — F419 Anxiety disorder, unspecified: Secondary | ICD-10-CM | POA: Diagnosis not present

## 2018-09-17 DIAGNOSIS — I4510 Unspecified right bundle-branch block: Secondary | ICD-10-CM | POA: Diagnosis not present

## 2018-09-17 DIAGNOSIS — R2681 Unsteadiness on feet: Secondary | ICD-10-CM | POA: Diagnosis not present

## 2018-09-17 DIAGNOSIS — F0280 Dementia in other diseases classified elsewhere without behavioral disturbance: Secondary | ICD-10-CM | POA: Diagnosis not present

## 2018-09-21 DIAGNOSIS — I4510 Unspecified right bundle-branch block: Secondary | ICD-10-CM | POA: Diagnosis not present

## 2018-09-21 DIAGNOSIS — G309 Alzheimer's disease, unspecified: Secondary | ICD-10-CM | POA: Diagnosis not present

## 2018-09-21 DIAGNOSIS — I10 Essential (primary) hypertension: Secondary | ICD-10-CM | POA: Diagnosis not present

## 2018-09-21 DIAGNOSIS — F419 Anxiety disorder, unspecified: Secondary | ICD-10-CM | POA: Diagnosis not present

## 2018-09-21 DIAGNOSIS — F0280 Dementia in other diseases classified elsewhere without behavioral disturbance: Secondary | ICD-10-CM | POA: Diagnosis not present

## 2018-09-21 DIAGNOSIS — B9620 Unspecified Escherichia coli [E. coli] as the cause of diseases classified elsewhere: Secondary | ICD-10-CM | POA: Diagnosis not present

## 2018-09-21 DIAGNOSIS — R2681 Unsteadiness on feet: Secondary | ICD-10-CM | POA: Diagnosis not present

## 2018-09-23 DIAGNOSIS — F0280 Dementia in other diseases classified elsewhere without behavioral disturbance: Secondary | ICD-10-CM | POA: Diagnosis not present

## 2018-09-23 DIAGNOSIS — I4510 Unspecified right bundle-branch block: Secondary | ICD-10-CM | POA: Diagnosis not present

## 2018-09-23 DIAGNOSIS — R2681 Unsteadiness on feet: Secondary | ICD-10-CM | POA: Diagnosis not present

## 2018-09-23 DIAGNOSIS — I10 Essential (primary) hypertension: Secondary | ICD-10-CM | POA: Diagnosis not present

## 2018-09-23 DIAGNOSIS — F419 Anxiety disorder, unspecified: Secondary | ICD-10-CM | POA: Diagnosis not present

## 2018-09-23 DIAGNOSIS — G309 Alzheimer's disease, unspecified: Secondary | ICD-10-CM | POA: Diagnosis not present

## 2018-09-28 DIAGNOSIS — I10 Essential (primary) hypertension: Secondary | ICD-10-CM | POA: Diagnosis not present

## 2018-09-28 DIAGNOSIS — F0280 Dementia in other diseases classified elsewhere without behavioral disturbance: Secondary | ICD-10-CM | POA: Diagnosis not present

## 2018-09-28 DIAGNOSIS — G309 Alzheimer's disease, unspecified: Secondary | ICD-10-CM | POA: Diagnosis not present

## 2018-09-28 DIAGNOSIS — F419 Anxiety disorder, unspecified: Secondary | ICD-10-CM | POA: Diagnosis not present

## 2018-09-28 DIAGNOSIS — I4510 Unspecified right bundle-branch block: Secondary | ICD-10-CM | POA: Diagnosis not present

## 2018-09-28 DIAGNOSIS — R2681 Unsteadiness on feet: Secondary | ICD-10-CM | POA: Diagnosis not present

## 2018-09-30 DIAGNOSIS — F419 Anxiety disorder, unspecified: Secondary | ICD-10-CM | POA: Diagnosis not present

## 2018-09-30 DIAGNOSIS — I4510 Unspecified right bundle-branch block: Secondary | ICD-10-CM | POA: Diagnosis not present

## 2018-09-30 DIAGNOSIS — G309 Alzheimer's disease, unspecified: Secondary | ICD-10-CM | POA: Diagnosis not present

## 2018-09-30 DIAGNOSIS — F0280 Dementia in other diseases classified elsewhere without behavioral disturbance: Secondary | ICD-10-CM | POA: Diagnosis not present

## 2018-09-30 DIAGNOSIS — I10 Essential (primary) hypertension: Secondary | ICD-10-CM | POA: Diagnosis not present

## 2018-09-30 DIAGNOSIS — R2681 Unsteadiness on feet: Secondary | ICD-10-CM | POA: Diagnosis not present

## 2018-10-05 DIAGNOSIS — R2681 Unsteadiness on feet: Secondary | ICD-10-CM | POA: Diagnosis not present

## 2018-10-05 DIAGNOSIS — G309 Alzheimer's disease, unspecified: Secondary | ICD-10-CM | POA: Diagnosis not present

## 2018-10-05 DIAGNOSIS — F419 Anxiety disorder, unspecified: Secondary | ICD-10-CM | POA: Diagnosis not present

## 2018-10-05 DIAGNOSIS — I4510 Unspecified right bundle-branch block: Secondary | ICD-10-CM | POA: Diagnosis not present

## 2018-10-05 DIAGNOSIS — F0280 Dementia in other diseases classified elsewhere without behavioral disturbance: Secondary | ICD-10-CM | POA: Diagnosis not present

## 2018-10-05 DIAGNOSIS — I10 Essential (primary) hypertension: Secondary | ICD-10-CM | POA: Diagnosis not present

## 2018-10-07 DIAGNOSIS — I4510 Unspecified right bundle-branch block: Secondary | ICD-10-CM | POA: Diagnosis not present

## 2018-10-07 DIAGNOSIS — R2681 Unsteadiness on feet: Secondary | ICD-10-CM | POA: Diagnosis not present

## 2018-10-07 DIAGNOSIS — I10 Essential (primary) hypertension: Secondary | ICD-10-CM | POA: Diagnosis not present

## 2018-10-07 DIAGNOSIS — F0280 Dementia in other diseases classified elsewhere without behavioral disturbance: Secondary | ICD-10-CM | POA: Diagnosis not present

## 2018-10-07 DIAGNOSIS — F419 Anxiety disorder, unspecified: Secondary | ICD-10-CM | POA: Diagnosis not present

## 2018-10-07 DIAGNOSIS — G309 Alzheimer's disease, unspecified: Secondary | ICD-10-CM | POA: Diagnosis not present

## 2018-10-12 DIAGNOSIS — F0280 Dementia in other diseases classified elsewhere without behavioral disturbance: Secondary | ICD-10-CM | POA: Diagnosis not present

## 2018-10-12 DIAGNOSIS — G309 Alzheimer's disease, unspecified: Secondary | ICD-10-CM | POA: Diagnosis not present

## 2018-10-12 DIAGNOSIS — I4510 Unspecified right bundle-branch block: Secondary | ICD-10-CM | POA: Diagnosis not present

## 2018-10-12 DIAGNOSIS — R2681 Unsteadiness on feet: Secondary | ICD-10-CM | POA: Diagnosis not present

## 2018-10-12 DIAGNOSIS — I10 Essential (primary) hypertension: Secondary | ICD-10-CM | POA: Diagnosis not present

## 2018-10-12 DIAGNOSIS — F419 Anxiety disorder, unspecified: Secondary | ICD-10-CM | POA: Diagnosis not present

## 2018-10-13 DIAGNOSIS — F0280 Dementia in other diseases classified elsewhere without behavioral disturbance: Secondary | ICD-10-CM | POA: Diagnosis not present

## 2018-10-13 DIAGNOSIS — G309 Alzheimer's disease, unspecified: Secondary | ICD-10-CM | POA: Diagnosis not present

## 2018-10-13 DIAGNOSIS — I10 Essential (primary) hypertension: Secondary | ICD-10-CM | POA: Diagnosis not present

## 2018-10-13 DIAGNOSIS — F419 Anxiety disorder, unspecified: Secondary | ICD-10-CM | POA: Diagnosis not present

## 2018-10-13 DIAGNOSIS — I4510 Unspecified right bundle-branch block: Secondary | ICD-10-CM | POA: Diagnosis not present

## 2018-10-13 DIAGNOSIS — R2681 Unsteadiness on feet: Secondary | ICD-10-CM | POA: Diagnosis not present

## 2018-10-15 DIAGNOSIS — I4510 Unspecified right bundle-branch block: Secondary | ICD-10-CM | POA: Diagnosis not present

## 2018-10-15 DIAGNOSIS — G309 Alzheimer's disease, unspecified: Secondary | ICD-10-CM | POA: Diagnosis not present

## 2018-10-15 DIAGNOSIS — I10 Essential (primary) hypertension: Secondary | ICD-10-CM | POA: Diagnosis not present

## 2018-10-15 DIAGNOSIS — F0280 Dementia in other diseases classified elsewhere without behavioral disturbance: Secondary | ICD-10-CM | POA: Diagnosis not present

## 2018-10-15 DIAGNOSIS — R2681 Unsteadiness on feet: Secondary | ICD-10-CM | POA: Diagnosis not present

## 2018-10-15 DIAGNOSIS — F419 Anxiety disorder, unspecified: Secondary | ICD-10-CM | POA: Diagnosis not present

## 2018-10-19 DIAGNOSIS — R2681 Unsteadiness on feet: Secondary | ICD-10-CM | POA: Diagnosis not present

## 2018-10-19 DIAGNOSIS — F0280 Dementia in other diseases classified elsewhere without behavioral disturbance: Secondary | ICD-10-CM | POA: Diagnosis not present

## 2018-10-19 DIAGNOSIS — F419 Anxiety disorder, unspecified: Secondary | ICD-10-CM | POA: Diagnosis not present

## 2018-10-19 DIAGNOSIS — G309 Alzheimer's disease, unspecified: Secondary | ICD-10-CM | POA: Diagnosis not present

## 2018-10-19 DIAGNOSIS — I4510 Unspecified right bundle-branch block: Secondary | ICD-10-CM | POA: Diagnosis not present

## 2018-10-19 DIAGNOSIS — I10 Essential (primary) hypertension: Secondary | ICD-10-CM | POA: Diagnosis not present

## 2018-10-21 DIAGNOSIS — G309 Alzheimer's disease, unspecified: Secondary | ICD-10-CM | POA: Diagnosis not present

## 2018-10-21 DIAGNOSIS — F419 Anxiety disorder, unspecified: Secondary | ICD-10-CM | POA: Diagnosis not present

## 2018-10-21 DIAGNOSIS — F0280 Dementia in other diseases classified elsewhere without behavioral disturbance: Secondary | ICD-10-CM | POA: Diagnosis not present

## 2018-10-21 DIAGNOSIS — I10 Essential (primary) hypertension: Secondary | ICD-10-CM | POA: Diagnosis not present

## 2018-10-21 DIAGNOSIS — R2681 Unsteadiness on feet: Secondary | ICD-10-CM | POA: Diagnosis not present

## 2018-10-21 DIAGNOSIS — I4510 Unspecified right bundle-branch block: Secondary | ICD-10-CM | POA: Diagnosis not present

## 2018-10-26 DIAGNOSIS — I4510 Unspecified right bundle-branch block: Secondary | ICD-10-CM | POA: Diagnosis not present

## 2018-10-26 DIAGNOSIS — I10 Essential (primary) hypertension: Secondary | ICD-10-CM | POA: Diagnosis not present

## 2018-10-26 DIAGNOSIS — G309 Alzheimer's disease, unspecified: Secondary | ICD-10-CM | POA: Diagnosis not present

## 2018-10-26 DIAGNOSIS — F419 Anxiety disorder, unspecified: Secondary | ICD-10-CM | POA: Diagnosis not present

## 2018-10-26 DIAGNOSIS — F0280 Dementia in other diseases classified elsewhere without behavioral disturbance: Secondary | ICD-10-CM | POA: Diagnosis not present

## 2018-10-26 DIAGNOSIS — R2681 Unsteadiness on feet: Secondary | ICD-10-CM | POA: Diagnosis not present

## 2018-10-28 DIAGNOSIS — G309 Alzheimer's disease, unspecified: Secondary | ICD-10-CM | POA: Diagnosis not present

## 2018-10-28 DIAGNOSIS — F0280 Dementia in other diseases classified elsewhere without behavioral disturbance: Secondary | ICD-10-CM | POA: Diagnosis not present

## 2018-10-28 DIAGNOSIS — I4510 Unspecified right bundle-branch block: Secondary | ICD-10-CM | POA: Diagnosis not present

## 2018-10-28 DIAGNOSIS — I10 Essential (primary) hypertension: Secondary | ICD-10-CM | POA: Diagnosis not present

## 2018-10-28 DIAGNOSIS — F419 Anxiety disorder, unspecified: Secondary | ICD-10-CM | POA: Diagnosis not present

## 2018-10-28 DIAGNOSIS — R2681 Unsteadiness on feet: Secondary | ICD-10-CM | POA: Diagnosis not present

## 2018-11-02 DIAGNOSIS — F0280 Dementia in other diseases classified elsewhere without behavioral disturbance: Secondary | ICD-10-CM | POA: Diagnosis not present

## 2018-11-02 DIAGNOSIS — F419 Anxiety disorder, unspecified: Secondary | ICD-10-CM | POA: Diagnosis not present

## 2018-11-02 DIAGNOSIS — I4510 Unspecified right bundle-branch block: Secondary | ICD-10-CM | POA: Diagnosis not present

## 2018-11-02 DIAGNOSIS — I10 Essential (primary) hypertension: Secondary | ICD-10-CM | POA: Diagnosis not present

## 2018-11-02 DIAGNOSIS — G309 Alzheimer's disease, unspecified: Secondary | ICD-10-CM | POA: Diagnosis not present

## 2018-11-02 DIAGNOSIS — R2681 Unsteadiness on feet: Secondary | ICD-10-CM | POA: Diagnosis not present

## 2018-11-03 DIAGNOSIS — B351 Tinea unguium: Secondary | ICD-10-CM | POA: Diagnosis not present

## 2018-11-03 DIAGNOSIS — M79675 Pain in left toe(s): Secondary | ICD-10-CM | POA: Diagnosis not present

## 2018-11-03 DIAGNOSIS — M79674 Pain in right toe(s): Secondary | ICD-10-CM | POA: Diagnosis not present

## 2018-11-04 DIAGNOSIS — F0280 Dementia in other diseases classified elsewhere without behavioral disturbance: Secondary | ICD-10-CM | POA: Diagnosis not present

## 2018-11-04 DIAGNOSIS — I10 Essential (primary) hypertension: Secondary | ICD-10-CM | POA: Diagnosis not present

## 2018-11-04 DIAGNOSIS — G309 Alzheimer's disease, unspecified: Secondary | ICD-10-CM | POA: Diagnosis not present

## 2018-11-04 DIAGNOSIS — I4510 Unspecified right bundle-branch block: Secondary | ICD-10-CM | POA: Diagnosis not present

## 2018-11-04 DIAGNOSIS — R2681 Unsteadiness on feet: Secondary | ICD-10-CM | POA: Diagnosis not present

## 2018-11-04 DIAGNOSIS — F419 Anxiety disorder, unspecified: Secondary | ICD-10-CM | POA: Diagnosis not present

## 2018-11-09 DIAGNOSIS — I4510 Unspecified right bundle-branch block: Secondary | ICD-10-CM | POA: Diagnosis not present

## 2018-11-09 DIAGNOSIS — F419 Anxiety disorder, unspecified: Secondary | ICD-10-CM | POA: Diagnosis not present

## 2018-11-09 DIAGNOSIS — I10 Essential (primary) hypertension: Secondary | ICD-10-CM | POA: Diagnosis not present

## 2018-11-09 DIAGNOSIS — F0280 Dementia in other diseases classified elsewhere without behavioral disturbance: Secondary | ICD-10-CM | POA: Diagnosis not present

## 2018-11-09 DIAGNOSIS — R2681 Unsteadiness on feet: Secondary | ICD-10-CM | POA: Diagnosis not present

## 2018-11-09 DIAGNOSIS — G309 Alzheimer's disease, unspecified: Secondary | ICD-10-CM | POA: Diagnosis not present

## 2018-11-11 DIAGNOSIS — G309 Alzheimer's disease, unspecified: Secondary | ICD-10-CM | POA: Diagnosis not present

## 2018-11-11 DIAGNOSIS — I4510 Unspecified right bundle-branch block: Secondary | ICD-10-CM | POA: Diagnosis not present

## 2018-11-11 DIAGNOSIS — I10 Essential (primary) hypertension: Secondary | ICD-10-CM | POA: Diagnosis not present

## 2018-11-11 DIAGNOSIS — R2681 Unsteadiness on feet: Secondary | ICD-10-CM | POA: Diagnosis not present

## 2018-11-11 DIAGNOSIS — F419 Anxiety disorder, unspecified: Secondary | ICD-10-CM | POA: Diagnosis not present

## 2018-11-11 DIAGNOSIS — F0280 Dementia in other diseases classified elsewhere without behavioral disturbance: Secondary | ICD-10-CM | POA: Diagnosis not present

## 2018-11-16 DIAGNOSIS — G309 Alzheimer's disease, unspecified: Secondary | ICD-10-CM | POA: Diagnosis not present

## 2018-11-16 DIAGNOSIS — R2681 Unsteadiness on feet: Secondary | ICD-10-CM | POA: Diagnosis not present

## 2018-11-16 DIAGNOSIS — F419 Anxiety disorder, unspecified: Secondary | ICD-10-CM | POA: Diagnosis not present

## 2018-11-16 DIAGNOSIS — F0280 Dementia in other diseases classified elsewhere without behavioral disturbance: Secondary | ICD-10-CM | POA: Diagnosis not present

## 2018-11-16 DIAGNOSIS — I10 Essential (primary) hypertension: Secondary | ICD-10-CM | POA: Diagnosis not present

## 2018-11-16 DIAGNOSIS — I4510 Unspecified right bundle-branch block: Secondary | ICD-10-CM | POA: Diagnosis not present

## 2018-11-23 DIAGNOSIS — G309 Alzheimer's disease, unspecified: Secondary | ICD-10-CM | POA: Diagnosis not present

## 2018-11-23 DIAGNOSIS — F419 Anxiety disorder, unspecified: Secondary | ICD-10-CM | POA: Diagnosis not present

## 2018-11-23 DIAGNOSIS — I10 Essential (primary) hypertension: Secondary | ICD-10-CM | POA: Diagnosis not present

## 2018-11-23 DIAGNOSIS — F0280 Dementia in other diseases classified elsewhere without behavioral disturbance: Secondary | ICD-10-CM | POA: Diagnosis not present

## 2018-11-23 DIAGNOSIS — R2681 Unsteadiness on feet: Secondary | ICD-10-CM | POA: Diagnosis not present

## 2018-11-23 DIAGNOSIS — I4510 Unspecified right bundle-branch block: Secondary | ICD-10-CM | POA: Diagnosis not present

## 2018-11-25 DIAGNOSIS — F0280 Dementia in other diseases classified elsewhere without behavioral disturbance: Secondary | ICD-10-CM | POA: Diagnosis not present

## 2018-11-25 DIAGNOSIS — F419 Anxiety disorder, unspecified: Secondary | ICD-10-CM | POA: Diagnosis not present

## 2018-11-25 DIAGNOSIS — I4510 Unspecified right bundle-branch block: Secondary | ICD-10-CM | POA: Diagnosis not present

## 2018-11-25 DIAGNOSIS — G309 Alzheimer's disease, unspecified: Secondary | ICD-10-CM | POA: Diagnosis not present

## 2018-11-25 DIAGNOSIS — I10 Essential (primary) hypertension: Secondary | ICD-10-CM | POA: Diagnosis not present

## 2018-11-25 DIAGNOSIS — R2681 Unsteadiness on feet: Secondary | ICD-10-CM | POA: Diagnosis not present

## 2018-11-30 DIAGNOSIS — F419 Anxiety disorder, unspecified: Secondary | ICD-10-CM | POA: Diagnosis not present

## 2018-11-30 DIAGNOSIS — I4510 Unspecified right bundle-branch block: Secondary | ICD-10-CM | POA: Diagnosis not present

## 2018-11-30 DIAGNOSIS — G309 Alzheimer's disease, unspecified: Secondary | ICD-10-CM | POA: Diagnosis not present

## 2018-11-30 DIAGNOSIS — I10 Essential (primary) hypertension: Secondary | ICD-10-CM | POA: Diagnosis not present

## 2018-11-30 DIAGNOSIS — F0280 Dementia in other diseases classified elsewhere without behavioral disturbance: Secondary | ICD-10-CM | POA: Diagnosis not present

## 2018-11-30 DIAGNOSIS — R2681 Unsteadiness on feet: Secondary | ICD-10-CM | POA: Diagnosis not present

## 2018-12-07 DIAGNOSIS — G309 Alzheimer's disease, unspecified: Secondary | ICD-10-CM | POA: Diagnosis not present

## 2018-12-07 DIAGNOSIS — I4510 Unspecified right bundle-branch block: Secondary | ICD-10-CM | POA: Diagnosis not present

## 2018-12-07 DIAGNOSIS — R2681 Unsteadiness on feet: Secondary | ICD-10-CM | POA: Diagnosis not present

## 2018-12-07 DIAGNOSIS — F419 Anxiety disorder, unspecified: Secondary | ICD-10-CM | POA: Diagnosis not present

## 2018-12-07 DIAGNOSIS — I10 Essential (primary) hypertension: Secondary | ICD-10-CM | POA: Diagnosis not present

## 2018-12-07 DIAGNOSIS — F0280 Dementia in other diseases classified elsewhere without behavioral disturbance: Secondary | ICD-10-CM | POA: Diagnosis not present

## 2018-12-11 DIAGNOSIS — I4510 Unspecified right bundle-branch block: Secondary | ICD-10-CM | POA: Diagnosis not present

## 2018-12-11 DIAGNOSIS — I10 Essential (primary) hypertension: Secondary | ICD-10-CM | POA: Diagnosis not present

## 2018-12-11 DIAGNOSIS — F0280 Dementia in other diseases classified elsewhere without behavioral disturbance: Secondary | ICD-10-CM | POA: Diagnosis not present

## 2018-12-11 DIAGNOSIS — F419 Anxiety disorder, unspecified: Secondary | ICD-10-CM | POA: Diagnosis not present

## 2018-12-11 DIAGNOSIS — R2681 Unsteadiness on feet: Secondary | ICD-10-CM | POA: Diagnosis not present

## 2018-12-11 DIAGNOSIS — G309 Alzheimer's disease, unspecified: Secondary | ICD-10-CM | POA: Diagnosis not present

## 2018-12-14 DIAGNOSIS — I10 Essential (primary) hypertension: Secondary | ICD-10-CM | POA: Diagnosis not present

## 2018-12-14 DIAGNOSIS — R2681 Unsteadiness on feet: Secondary | ICD-10-CM | POA: Diagnosis not present

## 2018-12-14 DIAGNOSIS — F0280 Dementia in other diseases classified elsewhere without behavioral disturbance: Secondary | ICD-10-CM | POA: Diagnosis not present

## 2018-12-14 DIAGNOSIS — F419 Anxiety disorder, unspecified: Secondary | ICD-10-CM | POA: Diagnosis not present

## 2018-12-14 DIAGNOSIS — G309 Alzheimer's disease, unspecified: Secondary | ICD-10-CM | POA: Diagnosis not present

## 2018-12-14 DIAGNOSIS — I4510 Unspecified right bundle-branch block: Secondary | ICD-10-CM | POA: Diagnosis not present

## 2018-12-16 DIAGNOSIS — G309 Alzheimer's disease, unspecified: Secondary | ICD-10-CM | POA: Diagnosis not present

## 2018-12-16 DIAGNOSIS — I4510 Unspecified right bundle-branch block: Secondary | ICD-10-CM | POA: Diagnosis not present

## 2018-12-16 DIAGNOSIS — I10 Essential (primary) hypertension: Secondary | ICD-10-CM | POA: Diagnosis not present

## 2018-12-16 DIAGNOSIS — F0280 Dementia in other diseases classified elsewhere without behavioral disturbance: Secondary | ICD-10-CM | POA: Diagnosis not present

## 2018-12-16 DIAGNOSIS — R2681 Unsteadiness on feet: Secondary | ICD-10-CM | POA: Diagnosis not present

## 2018-12-16 DIAGNOSIS — F419 Anxiety disorder, unspecified: Secondary | ICD-10-CM | POA: Diagnosis not present

## 2018-12-21 DIAGNOSIS — G309 Alzheimer's disease, unspecified: Secondary | ICD-10-CM | POA: Diagnosis not present

## 2018-12-21 DIAGNOSIS — F0280 Dementia in other diseases classified elsewhere without behavioral disturbance: Secondary | ICD-10-CM | POA: Diagnosis not present

## 2018-12-21 DIAGNOSIS — I10 Essential (primary) hypertension: Secondary | ICD-10-CM | POA: Diagnosis not present

## 2018-12-21 DIAGNOSIS — F419 Anxiety disorder, unspecified: Secondary | ICD-10-CM | POA: Diagnosis not present

## 2018-12-21 DIAGNOSIS — I4510 Unspecified right bundle-branch block: Secondary | ICD-10-CM | POA: Diagnosis not present

## 2018-12-21 DIAGNOSIS — R2681 Unsteadiness on feet: Secondary | ICD-10-CM | POA: Diagnosis not present

## 2018-12-28 DIAGNOSIS — G309 Alzheimer's disease, unspecified: Secondary | ICD-10-CM | POA: Diagnosis not present

## 2018-12-28 DIAGNOSIS — I10 Essential (primary) hypertension: Secondary | ICD-10-CM | POA: Diagnosis not present

## 2018-12-28 DIAGNOSIS — F419 Anxiety disorder, unspecified: Secondary | ICD-10-CM | POA: Diagnosis not present

## 2018-12-28 DIAGNOSIS — F0280 Dementia in other diseases classified elsewhere without behavioral disturbance: Secondary | ICD-10-CM | POA: Diagnosis not present

## 2018-12-28 DIAGNOSIS — I4510 Unspecified right bundle-branch block: Secondary | ICD-10-CM | POA: Diagnosis not present

## 2018-12-28 DIAGNOSIS — R2681 Unsteadiness on feet: Secondary | ICD-10-CM | POA: Diagnosis not present

## 2018-12-29 DIAGNOSIS — I10 Essential (primary) hypertension: Secondary | ICD-10-CM | POA: Diagnosis not present

## 2018-12-29 DIAGNOSIS — I4510 Unspecified right bundle-branch block: Secondary | ICD-10-CM | POA: Diagnosis not present

## 2018-12-29 DIAGNOSIS — F0280 Dementia in other diseases classified elsewhere without behavioral disturbance: Secondary | ICD-10-CM | POA: Diagnosis not present

## 2018-12-29 DIAGNOSIS — G309 Alzheimer's disease, unspecified: Secondary | ICD-10-CM | POA: Diagnosis not present

## 2018-12-29 DIAGNOSIS — R2681 Unsteadiness on feet: Secondary | ICD-10-CM | POA: Diagnosis not present

## 2018-12-29 DIAGNOSIS — F419 Anxiety disorder, unspecified: Secondary | ICD-10-CM | POA: Diagnosis not present

## 2019-01-05 DIAGNOSIS — J209 Acute bronchitis, unspecified: Secondary | ICD-10-CM | POA: Diagnosis not present

## 2019-01-05 DIAGNOSIS — F0390 Unspecified dementia without behavioral disturbance: Secondary | ICD-10-CM | POA: Diagnosis not present

## 2019-01-05 DIAGNOSIS — M79674 Pain in right toe(s): Secondary | ICD-10-CM | POA: Diagnosis not present

## 2019-01-05 DIAGNOSIS — F0280 Dementia in other diseases classified elsewhere without behavioral disturbance: Secondary | ICD-10-CM | POA: Diagnosis not present

## 2019-01-05 DIAGNOSIS — G309 Alzheimer's disease, unspecified: Secondary | ICD-10-CM | POA: Diagnosis not present

## 2019-01-05 DIAGNOSIS — I4510 Unspecified right bundle-branch block: Secondary | ICD-10-CM | POA: Diagnosis not present

## 2019-01-05 DIAGNOSIS — R2681 Unsteadiness on feet: Secondary | ICD-10-CM | POA: Diagnosis not present

## 2019-01-05 DIAGNOSIS — I10 Essential (primary) hypertension: Secondary | ICD-10-CM | POA: Diagnosis not present

## 2019-01-05 DIAGNOSIS — B351 Tinea unguium: Secondary | ICD-10-CM | POA: Diagnosis not present

## 2019-01-05 DIAGNOSIS — F419 Anxiety disorder, unspecified: Secondary | ICD-10-CM | POA: Diagnosis not present

## 2019-01-05 DIAGNOSIS — M79675 Pain in left toe(s): Secondary | ICD-10-CM | POA: Diagnosis not present

## 2019-01-07 DIAGNOSIS — U071 COVID-19: Secondary | ICD-10-CM | POA: Diagnosis not present

## 2019-01-07 DIAGNOSIS — Z20828 Contact with and (suspected) exposure to other viral communicable diseases: Secondary | ICD-10-CM | POA: Diagnosis not present

## 2019-01-11 DIAGNOSIS — U071 COVID-19: Secondary | ICD-10-CM | POA: Diagnosis not present

## 2019-01-11 DIAGNOSIS — Z20828 Contact with and (suspected) exposure to other viral communicable diseases: Secondary | ICD-10-CM | POA: Diagnosis not present

## 2019-01-22 ENCOUNTER — Encounter (HOSPITAL_COMMUNITY): Payer: Self-pay

## 2019-01-22 ENCOUNTER — Other Ambulatory Visit: Payer: Self-pay

## 2019-01-22 ENCOUNTER — Emergency Department (HOSPITAL_COMMUNITY)
Admission: EM | Admit: 2019-01-22 | Discharge: 2019-01-22 | Disposition: A | Discharge status: Skilled Nursing Facility | Payer: Medicare Other | Attending: Emergency Medicine | Admitting: Emergency Medicine

## 2019-01-22 ENCOUNTER — Emergency Department (HOSPITAL_COMMUNITY): Payer: Medicare Other

## 2019-01-22 DIAGNOSIS — W19XXXA Unspecified fall, initial encounter: Secondary | ICD-10-CM | POA: Diagnosis not present

## 2019-01-22 DIAGNOSIS — I10 Essential (primary) hypertension: Secondary | ICD-10-CM | POA: Insufficient documentation

## 2019-01-22 DIAGNOSIS — F0390 Unspecified dementia without behavioral disturbance: Secondary | ICD-10-CM | POA: Diagnosis not present

## 2019-01-22 DIAGNOSIS — Y92129 Unspecified place in nursing home as the place of occurrence of the external cause: Secondary | ICD-10-CM | POA: Insufficient documentation

## 2019-01-22 DIAGNOSIS — S0181XA Laceration without foreign body of other part of head, initial encounter: Secondary | ICD-10-CM

## 2019-01-22 DIAGNOSIS — S0121XA Laceration without foreign body of nose, initial encounter: Secondary | ICD-10-CM | POA: Insufficient documentation

## 2019-01-22 DIAGNOSIS — Y939 Activity, unspecified: Secondary | ICD-10-CM | POA: Diagnosis not present

## 2019-01-22 DIAGNOSIS — Y999 Unspecified external cause status: Secondary | ICD-10-CM | POA: Insufficient documentation

## 2019-01-22 DIAGNOSIS — S0990XA Unspecified injury of head, initial encounter: Secondary | ICD-10-CM

## 2019-01-22 LAB — URINALYSIS, ROUTINE W REFLEX MICROSCOPIC
Bacteria, UA: NONE SEEN
Bilirubin Urine: NEGATIVE
Glucose, UA: NEGATIVE mg/dL
Ketones, ur: NEGATIVE mg/dL
Leukocytes,Ua: NEGATIVE
Nitrite: NEGATIVE
Protein, ur: NEGATIVE mg/dL
Specific Gravity, Urine: 1.005 (ref 1.005–1.030)
pH: 6 (ref 5.0–8.0)

## 2019-01-22 NOTE — ED Notes (Signed)
Pt face cleaned with normal saline, pt tolerated well, laceration noted to nasal area,

## 2019-01-22 NOTE — Discharge Instructions (Signed)
Please read and follow all provided instructions.  Your diagnoses today include:  1. Injury of head, initial encounter   2. Facial laceration, initial encounter     Tests performed today include:  CT scan of your head, facial bones, and cervical spine -- that did not show any serious injury.  Urine test - pending  Vital signs. See below for your results today.   Medications prescribed:   None  Take any prescribed medications only as directed.  Home care instructions:  Follow any educational materials contained in this packet.  BE VERY CAREFUL not to take multiple medicines containing Tylenol (also called acetaminophen). Doing so can lead to an overdose which can damage your liver and cause liver failure and possibly death.   Follow-up instructions: Please follow-up with your primary care provider as needed for further evaluation of your symptoms.   Return instructions:  SEEK IMMEDIATE MEDICAL ATTENTION IF:  There is confusion or drowsiness (although children frequently become drowsy after injury).   You cannot awaken the injured person.   You have more than one episode of vomiting.   You notice dizziness or unsteadiness which is getting worse, or inability to walk.   You have convulsions or unconsciousness.   You experience severe, persistent headaches not relieved by Tylenol.  You cannot use arms or legs normally.   There are changes in pupil sizes. (This is the black center in the colored part of the eye)   There is clear or bloody discharge from the nose or ears.   You have change in speech, vision, swallowing, or understanding.   Localized weakness, numbness, tingling, or change in bowel or bladder control.  You have any other emergent concerns.  Additional Information: You have had a head injury which does not appear to require admission at this time.  Your vital signs today were: BP (!) 177/67 (BP Location: Right Arm)   Pulse 73   Temp 98.1 F (36.7  C) (Oral)   Resp 18   SpO2 95%  If your blood pressure (BP) was elevated above 135/85 this visit, please have this repeated by your doctor within one month. --------------
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
## 2019-01-22 NOTE — ED Provider Notes (Signed)
[facility] EMERGENCY DEPARTMENT Provider Note   CSN: [id number] Arrival date & time: 01/22/19  1755     History Chief Complaint  Patient presents with  . Fall  . Laceration    [name] is a 85 y.o. female.  Patient with history of dementia presents after being found on the floor at her nursing facility.  Patient has some abrasions to her face and laceration to the bridge of her nose.  Patient states that she fell off the bed.  She has dementia and confusion at baseline.  Level 5 caveat due to dementia.  Currently complains of nose pain.        Past Medical History:  Diagnosis Date  . Anxiety   . Dementia ([place])   . GERD (gastroesophageal reflux disease)   . Goiter   . Hypertension   . Osteoarthritis   . Osteoporosis   . Vitamin D insufficiency     Patient Active Problem List   Diagnosis Date Noted  . Chest pain 05/14/2016  . Goiter 05/14/2016  . Intertrochanteric fracture of right hip ([place]) 11/19/2012  . UTI (urinary tract infection) 11/19/2012  . Dizziness and giddiness 06/14/2012  . HTN (hypertension) 06/14/2012  . Difficulty in walking(719.7) 09/09/2011  . Osteoarthritis of leg 09/09/2011  . Abnormality of gait 09/09/2011  . Pain in joint, lower leg 09/09/2011    Past Surgical History:  Procedure Laterality Date  . APPENDECTOMY    . KIDNEY STONE SURGERY    . ORIF HIP FRACTURE Right 11/20/2012   Procedure: OPEN REDUCTION INTERNAL FIXATION RIGHT HIP;  Surgeon: Sanjuana Kava, MD;  Location: [facility];  Service: Orthopedics;  Laterality: Right;  . stress fractures of legs    . TUBAL LIGATION       OB History   No obstetric history on file.     Family History  Problem Relation Age of Onset  . Heart attack Father   . Heart disease Neg Hx     Social History   Tobacco Use  . Smoking status: Never Smoker  . Smokeless tobacco: Never Used  Substance Use Topics  . Alcohol use: No  . Drug use: No    Home Medications Prior to Admission  medications   Medication Sig Start Date End Date Taking? Authorizing Provider  acetaminophen (TYLENOL) 500 MG tablet Take 1,000 mg by mouth 3 (three) times daily. For pain    [provider]  amLODipine (NORVASC) 5 MG tablet Take 1 tablet (5 mg total) by mouth at bedtime. 05/15/16   Asencion Noble, MD  dextromethorphan-guaiFENesin (TUSSIN DM) 10-100 MG/5ML liquid Take 10 mLs by mouth every 6 (six) hours as needed for cough.    [provider]  donepezil (ARICEPT) 5 MG tablet Take 1 tablet (5 mg total) by mouth at bedtime. 05/15/16   Asencion Noble, MD  hydrochlorothiazide (MICROZIDE) 12.5 MG capsule Take 12.5 mg by mouth every morning.    [provider]  loperamide (IMODIUM) 2 MG capsule Take 2 mg by mouth every 4 (four) hours as needed for diarrhea or loose stools.    [provider]  losartan (COZAAR) 100 MG tablet Take 100 mg by mouth daily.    [provider]  omeprazole (PRILOSEC) 40 MG capsule Take 40 mg by mouth daily.    [provider]    Allergies    Hydrocodone-acetaminophen  Review of Systems   Review of Systems  Unable to perform ROS: Dementia    Physical Exam Updated Vital  Signs BP (!) 177/67 (BP Location: Right Arm)   Pulse 73   Temp 98.1 F (36.7 C) (Oral)   Resp 18   SpO2 95%   Physical Exam Vitals and nursing note reviewed.  Constitutional:      Appearance: She is well-developed.  HENT:     Head: Normocephalic. No raccoon eyes or Battle's sign.     Comments: Patient with a 5 mm laceration over the bridge of the nose.  This is superficial and nongaping.  There is some dried blood noted in the wound.  She has some other scattered abrasions.  No active bleeding.    Right Ear: Tympanic membrane, ear canal and external ear normal. No hemotympanum.     Left Ear: Tympanic membrane, ear canal and external ear normal. No hemotympanum.     Nose: Nose normal.     Mouth/Throat:     Pharynx: Uvula midline.  Eyes:     General:  Lids are normal.     Extraocular Movements:     Right eye: No nystagmus.     Left eye: No nystagmus.     Conjunctiva/sclera: Conjunctivae normal.     Pupils: Pupils are equal, round, and reactive to light.     Comments: No visible hyphema noted  Cardiovascular:     Rate and Rhythm: Normal rate and regular rhythm.  Pulmonary:     Effort: Pulmonary effort is normal.     Breath sounds: Normal breath sounds.  Abdominal:     Palpations: Abdomen is soft.     Tenderness: There is no abdominal tenderness.  Musculoskeletal:     Cervical back: Normal range of motion and neck supple. No tenderness or bony tenderness.     Thoracic back: No tenderness or bony tenderness.     Lumbar back: No tenderness or bony tenderness.     Comments: Patient is able to bend her knees and hips bilaterally without any difficulty.  Skin:    General: Skin is warm and dry.  Neurological:     Mental Status: She is alert and oriented to person, place, and time.     GCS: GCS eye subscore is 4. GCS verbal subscore is 5. GCS motor subscore is 6.     Cranial Nerves: No cranial nerve deficit.     Sensory: No sensory deficit.     Coordination: Coordination normal.     Deep Tendon Reflexes: Reflexes are normal and symmetric.     ED Results / Procedures / Treatments   Labs (all labs ordered are listed, but only abnormal results are displayed) Labs Reviewed - No data to display  EKG None  Radiology No results found.  Procedures Procedures (including critical care time)  Medications Ordered in ED Medications - No data to display  ED Course  I have reviewed the triage vital signs and the nursing notes.  Pertinent labs & imaging results that were available during my care of the patient were reviewed by me and considered in my medical decision making (see chart for details).  Patient seen and examined.  Patient is confused at baseline.  She does have some head trauma as noted.  CT of the head, facial bones,  cervical spine ordered.  Will clean wound and evaluate to determine if repair is needed.  Vital signs reviewed and are as follows: BP (!) 177/67 (BP Location: Right Arm)   Pulse 73   Temp 98.1 F (36.7 C) (Oral)   Resp 18   SpO2 95%  Patient updated on results.  She is very enthusiastic about being able to go home today.  RN came to me after imaging completed stating that daughter was concerned about a UTI and asked if we would send the urine.  This was sent.  No signs of infection in urine tonight.  I reevaluated patient's wound.  It is closed, hemostatic.  It is clean.  I do not feel that patient would benefit much from wound repair with sutures and would let this heal as is.  Patient will require good wound care.  Patient will be discharged when transportation available.    MDM Rules/Calculators/A&P                      Patient with head injury after a fall.  She was found on the floor at her facility.  She has dementia which seems to be at baseline.  UA is negative.  Imaging of the head, maxillofacial bones, and cervical spine do not demonstrate any acute injuries.  Patient does have a facial laceration, as above, that is mild and does not require repair tonight.  Patient will require good wound care.   Final Clinical Impression(s) / ED Diagnoses Final diagnoses:  Injury of head, initial encounter  Facial laceration, initial encounter    Rx / DC Orders ED Discharge Orders    None       Renne CriglerGeiple, Valdez Brannan, PA-C 01/22/19 2326    Sabas SousBero, Michael M, MD 01/26/19 [phone]

## 2019-02-08 DIAGNOSIS — U071 COVID-19: Secondary | ICD-10-CM | POA: Diagnosis not present

## 2019-02-08 DIAGNOSIS — Z20828 Contact with and (suspected) exposure to other viral communicable diseases: Secondary | ICD-10-CM | POA: Diagnosis not present

## 2019-02-11 ENCOUNTER — Encounter (HOSPITAL_COMMUNITY): Payer: Self-pay | Admitting: Emergency Medicine

## 2019-02-11 ENCOUNTER — Emergency Department (HOSPITAL_COMMUNITY): Payer: Medicare Other

## 2019-02-11 ENCOUNTER — Inpatient Hospital Stay (HOSPITAL_COMMUNITY)
Admission: EM | Admit: 2019-02-11 | Discharge: 2019-02-19 | DRG: 193 | Disposition: A | Discharge status: Skilled Nursing Facility | Payer: Medicare Other | Source: Skilled Nursing Facility | Attending: Internal Medicine | Admitting: Internal Medicine

## 2019-02-11 ENCOUNTER — Other Ambulatory Visit: Payer: Self-pay

## 2019-02-11 DIAGNOSIS — Z7189 Other specified counseling: Secondary | ICD-10-CM | POA: Diagnosis not present

## 2019-02-11 DIAGNOSIS — R627 Adult failure to thrive: Secondary | ICD-10-CM

## 2019-02-11 DIAGNOSIS — R293 Abnormal posture: Secondary | ICD-10-CM | POA: Diagnosis not present

## 2019-02-11 DIAGNOSIS — R633 Feeding difficulties: Secondary | ICD-10-CM | POA: Diagnosis not present

## 2019-02-11 DIAGNOSIS — I444 Left anterior fascicular block: Secondary | ICD-10-CM | POA: Diagnosis not present

## 2019-02-11 DIAGNOSIS — R262 Difficulty in walking, not elsewhere classified: Secondary | ICD-10-CM | POA: Diagnosis not present

## 2019-02-11 DIAGNOSIS — M159 Polyosteoarthritis, unspecified: Secondary | ICD-10-CM | POA: Diagnosis not present

## 2019-02-11 DIAGNOSIS — E860 Dehydration: Secondary | ICD-10-CM | POA: Diagnosis not present

## 2019-02-11 DIAGNOSIS — Z515 Encounter for palliative care: Secondary | ICD-10-CM

## 2019-02-11 DIAGNOSIS — R739 Hyperglycemia, unspecified: Secondary | ICD-10-CM | POA: Diagnosis present

## 2019-02-11 DIAGNOSIS — R29898 Other symptoms and signs involving the musculoskeletal system: Secondary | ICD-10-CM | POA: Diagnosis not present

## 2019-02-11 DIAGNOSIS — R7989 Other specified abnormal findings of blood chemistry: Secondary | ICD-10-CM | POA: Diagnosis not present

## 2019-02-11 DIAGNOSIS — Z8249 Family history of ischemic heart disease and other diseases of the circulatory system: Secondary | ICD-10-CM | POA: Diagnosis not present

## 2019-02-11 DIAGNOSIS — R279 Unspecified lack of coordination: Secondary | ICD-10-CM | POA: Diagnosis not present

## 2019-02-11 DIAGNOSIS — R0902 Hypoxemia: Secondary | ICD-10-CM | POA: Diagnosis not present

## 2019-02-11 DIAGNOSIS — Z978 Presence of other specified devices: Secondary | ICD-10-CM | POA: Diagnosis not present

## 2019-02-11 DIAGNOSIS — J918 Pleural effusion in other conditions classified elsewhere: Secondary | ICD-10-CM | POA: Diagnosis present

## 2019-02-11 DIAGNOSIS — E559 Vitamin D deficiency, unspecified: Secondary | ICD-10-CM | POA: Diagnosis present

## 2019-02-11 DIAGNOSIS — I959 Hypotension, unspecified: Secondary | ICD-10-CM | POA: Diagnosis present

## 2019-02-11 DIAGNOSIS — J90 Pleural effusion, not elsewhere classified: Secondary | ICD-10-CM | POA: Diagnosis not present

## 2019-02-11 DIAGNOSIS — R531 Weakness: Secondary | ICD-10-CM

## 2019-02-11 DIAGNOSIS — R269 Unspecified abnormalities of gait and mobility: Secondary | ICD-10-CM

## 2019-02-11 DIAGNOSIS — M810 Age-related osteoporosis without current pathological fracture: Secondary | ICD-10-CM | POA: Diagnosis present

## 2019-02-11 DIAGNOSIS — F411 Generalized anxiety disorder: Secondary | ICD-10-CM | POA: Diagnosis not present

## 2019-02-11 DIAGNOSIS — Z20822 Contact with and (suspected) exposure to covid-19: Secondary | ICD-10-CM | POA: Diagnosis present

## 2019-02-11 DIAGNOSIS — F0390 Unspecified dementia without behavioral disturbance: Secondary | ICD-10-CM | POA: Diagnosis not present

## 2019-02-11 DIAGNOSIS — Z9981 Dependence on supplemental oxygen: Secondary | ICD-10-CM | POA: Diagnosis not present

## 2019-02-11 DIAGNOSIS — I10 Essential (primary) hypertension: Secondary | ICD-10-CM | POA: Diagnosis present

## 2019-02-11 DIAGNOSIS — J189 Pneumonia, unspecified organism: Secondary | ICD-10-CM | POA: Diagnosis present

## 2019-02-11 DIAGNOSIS — Z79899 Other long term (current) drug therapy: Secondary | ICD-10-CM

## 2019-02-11 DIAGNOSIS — M6281 Muscle weakness (generalized): Secondary | ICD-10-CM | POA: Diagnosis not present

## 2019-02-11 DIAGNOSIS — Z743 Need for continuous supervision: Secondary | ICD-10-CM | POA: Diagnosis not present

## 2019-02-11 DIAGNOSIS — J9601 Acute respiratory failure with hypoxia: Secondary | ICD-10-CM

## 2019-02-11 DIAGNOSIS — M1990 Unspecified osteoarthritis, unspecified site: Secondary | ICD-10-CM | POA: Diagnosis not present

## 2019-02-11 DIAGNOSIS — F419 Anxiety disorder, unspecified: Secondary | ICD-10-CM | POA: Diagnosis present

## 2019-02-11 DIAGNOSIS — G9341 Metabolic encephalopathy: Secondary | ICD-10-CM | POA: Diagnosis present

## 2019-02-11 DIAGNOSIS — E876 Hypokalemia: Secondary | ICD-10-CM | POA: Diagnosis present

## 2019-02-11 DIAGNOSIS — K219 Gastro-esophageal reflux disease without esophagitis: Secondary | ICD-10-CM | POA: Diagnosis present

## 2019-02-11 DIAGNOSIS — Z66 Do not resuscitate: Secondary | ICD-10-CM | POA: Diagnosis not present

## 2019-02-11 DIAGNOSIS — Z23 Encounter for immunization: Secondary | ICD-10-CM | POA: Diagnosis not present

## 2019-02-11 DIAGNOSIS — R1310 Dysphagia, unspecified: Secondary | ICD-10-CM | POA: Diagnosis not present

## 2019-02-11 DIAGNOSIS — Z993 Dependence on wheelchair: Secondary | ICD-10-CM | POA: Diagnosis not present

## 2019-02-11 DIAGNOSIS — Z8781 Personal history of (healed) traumatic fracture: Secondary | ICD-10-CM | POA: Diagnosis not present

## 2019-02-11 DIAGNOSIS — I4510 Unspecified right bundle-branch block: Secondary | ICD-10-CM | POA: Diagnosis not present

## 2019-02-11 DIAGNOSIS — J9611 Chronic respiratory failure with hypoxia: Secondary | ICD-10-CM | POA: Diagnosis not present

## 2019-02-11 DIAGNOSIS — Z741 Need for assistance with personal care: Secondary | ICD-10-CM | POA: Diagnosis not present

## 2019-02-11 LAB — TROPONIN I (HIGH SENSITIVITY)
Troponin I (High Sensitivity): 7 ng/L (ref <18)
Troponin I (High Sensitivity): 7 ng/L (ref <18)

## 2019-02-11 LAB — COMPREHENSIVE METABOLIC PANEL
ALT: 11 U/L (ref 0–44)
AST: 19 U/L (ref 15–41)
Albumin: 3.3 g/dL — ABNORMAL LOW (ref 3.5–5.0)
Alkaline Phosphatase: 43 U/L (ref 38–126)
Anion gap: 10 (ref 5–15)
BUN: 25 mg/dL — ABNORMAL HIGH (ref 8–23)
CO2: 26 mmol/L (ref 22–32)
Calcium: 8.6 mg/dL — ABNORMAL LOW (ref 8.9–10.3)
Chloride: 98 mmol/L (ref 98–111)
Creatinine, Ser: 0.83 mg/dL (ref 0.44–1.00)
GFR calc Af Amer: >60 mL/min (ref >60)
GFR calc non Af Amer: >60 mL/min (ref >60)
Glucose, Bld: 112 mg/dL — ABNORMAL HIGH (ref 70–99)
Potassium: 3.1 mmol/L — ABNORMAL LOW (ref 3.5–5.1)
Sodium: 134 mmol/L — ABNORMAL LOW (ref 135–145)
Total Bilirubin: 0.7 mg/dL (ref 0.3–1.2)
Total Protein: 7.3 g/dL (ref 6.5–8.1)

## 2019-02-11 LAB — RESPIRATORY PANEL BY RT PCR (FLU A&B, COVID)
Influenza A by PCR: NEGATIVE
Influenza B by PCR: NEGATIVE
SARS Coronavirus 2 by RT PCR: NEGATIVE

## 2019-02-11 LAB — URINALYSIS, ROUTINE W REFLEX MICROSCOPIC
Bacteria, UA: NONE SEEN
Bilirubin Urine: NEGATIVE
Glucose, UA: NEGATIVE mg/dL
Ketones, ur: NEGATIVE mg/dL
Leukocytes,Ua: NEGATIVE
Nitrite: NEGATIVE
Protein, ur: 30 mg/dL — AB
Specific Gravity, Urine: 1.029 (ref 1.005–1.030)
pH: 5 (ref 5.0–8.0)

## 2019-02-11 LAB — CBC WITH DIFFERENTIAL/PLATELET
Abs Immature Granulocytes: 0.01 10*3/uL (ref 0.00–0.07)
Basophils Absolute: 0 10*3/uL (ref 0.0–0.1)
Basophils Relative: 0 %
Eosinophils Absolute: 0 10*3/uL (ref 0.0–0.5)
Eosinophils Relative: 0 %
HCT: 39.8 % (ref 36.0–46.0)
Hemoglobin: 12.9 g/dL (ref 12.0–15.0)
Immature Granulocytes: 0 %
Lymphocytes Relative: 15 %
Lymphs Abs: 0.6 10*3/uL — ABNORMAL LOW (ref 0.7–4.0)
MCH: 31.2 pg (ref 26.0–34.0)
MCHC: 32.4 g/dL (ref 30.0–36.0)
MCV: 96.4 fL (ref 80.0–100.0)
Monocytes Absolute: 0.4 10*3/uL (ref 0.1–1.0)
Monocytes Relative: 11 %
Neutro Abs: 3.1 10*3/uL (ref 1.7–7.7)
Neutrophils Relative %: 74 %
Platelets: 211 10*3/uL (ref 150–400)
RBC: 4.13 MIL/uL (ref 3.87–5.11)
RDW: 13.3 % (ref 11.5–15.5)
WBC: 4.2 10*3/uL (ref 4.0–10.5)
nRBC: 0 % (ref 0.0–0.2)

## 2019-02-11 LAB — POC SARS CORONAVIRUS 2 AG -  ED: SARS Coronavirus 2 Ag: NEGATIVE

## 2019-02-11 MED ORDER — ENOXAPARIN SODIUM 40 MG/0.4ML ~~LOC~~ SOLN
40.0000 mg | SUBCUTANEOUS | Status: DC
  Administered 2019-02-11 – 2019-02-14 (×4): 40 mg via SUBCUTANEOUS
  Filled 2019-02-11 (×4): qty 0.4

## 2019-02-11 MED ORDER — SODIUM CHLORIDE 0.9 % IV SOLN
500.0000 mg | INTRAVENOUS | Status: DC
  Administered 2019-02-11 – 2019-02-14 (×4): 500 mg via INTRAVENOUS
  Filled 2019-02-11 (×4): qty 500

## 2019-02-11 MED ORDER — IPRATROPIUM-ALBUTEROL 0.5-2.5 (3) MG/3ML IN SOLN
3.0000 mL | Freq: Four times a day (QID) | RESPIRATORY_TRACT | Status: DC
  Administered 2019-02-11 (×2): 3 mL via RESPIRATORY_TRACT
  Filled 2019-02-11 (×2): qty 3

## 2019-02-11 MED ORDER — LOSARTAN POTASSIUM 50 MG PO TABS
100.0000 mg | ORAL_TABLET | Freq: Every day | ORAL | Status: DC
  Administered 2019-02-11 – 2019-02-14 (×4): 100 mg via ORAL
  Filled 2019-02-11 (×2): qty 2
  Filled 2019-02-11: qty 4
  Filled 2019-02-11: qty 2

## 2019-02-11 MED ORDER — IPRATROPIUM-ALBUTEROL 0.5-2.5 (3) MG/3ML IN SOLN
3.0000 mL | Freq: Two times a day (BID) | RESPIRATORY_TRACT | Status: DC
  Administered 2019-02-12 – 2019-02-19 (×15): 3 mL via RESPIRATORY_TRACT
  Filled 2019-02-11 (×14): qty 3

## 2019-02-11 MED ORDER — POTASSIUM CHLORIDE CRYS ER 20 MEQ PO TBCR
40.0000 meq | EXTENDED_RELEASE_TABLET | Freq: Once | ORAL | Status: AC
  Administered 2019-02-11: 40 meq via ORAL
  Filled 2019-02-11: qty 2

## 2019-02-11 MED ORDER — ONDANSETRON HCL 4 MG/2ML IJ SOLN: 4.0000 mg | Freq: Four times a day (QID) | INTRAMUSCULAR | Status: DC | PRN

## 2019-02-11 MED ORDER — DONEPEZIL HCL 5 MG PO TABS
5.0000 mg | ORAL_TABLET | Freq: Every day | ORAL | Status: DC
  Administered 2019-02-11 – 2019-02-13 (×3): 5 mg via ORAL
  Filled 2019-02-11 (×4): qty 1

## 2019-02-11 MED ORDER — DEXTROMETHORPHAN-GUAIFENESIN 10-100 MG/5ML PO LIQD
10.0000 mL | Freq: Four times a day (QID) | ORAL | Status: DC | PRN
  Filled 2019-02-11: qty 10

## 2019-02-11 MED ORDER — PANTOPRAZOLE SODIUM 40 MG PO TBEC
40.0000 mg | DELAYED_RELEASE_TABLET | Freq: Every day | ORAL | Status: DC
  Administered 2019-02-11 – 2019-02-14 (×4): 40 mg via ORAL
  Filled 2019-02-11 (×4): qty 1

## 2019-02-11 MED ORDER — SODIUM CHLORIDE 0.9 % IV SOLN
1.0000 g | INTRAVENOUS | Status: DC
  Administered 2019-02-12 – 2019-02-14 (×3): 1 g via INTRAVENOUS
  Filled 2019-02-11 (×3): qty 10

## 2019-02-11 MED ORDER — POTASSIUM CHLORIDE 10 MEQ/100ML IV SOLN
10.0000 meq | INTRAVENOUS | Status: AC
  Administered 2019-02-11 (×3): 10 meq via INTRAVENOUS
  Filled 2019-02-11 (×3): qty 100

## 2019-02-11 MED ORDER — SODIUM CHLORIDE 0.9 % IV SOLN: INTRAVENOUS | Status: DC

## 2019-02-11 MED ORDER — AMLODIPINE BESYLATE 5 MG PO TABS
5.0000 mg | ORAL_TABLET | Freq: Every day | ORAL | Status: DC
  Administered 2019-02-11 – 2019-02-13 (×3): 5 mg via ORAL
  Filled 2019-02-11 (×3): qty 1

## 2019-02-11 MED ORDER — SODIUM CHLORIDE 0.9 % IV SOLN
2.0000 g | Freq: Once | INTRAVENOUS | Status: AC
  Administered 2019-02-11: 2 g via INTRAVENOUS
  Filled 2019-02-11: qty 20

## 2019-02-11 MED ORDER — LOPERAMIDE HCL 2 MG PO CAPS: 2.0000 mg | ORAL_CAPSULE | ORAL | Status: DC | PRN

## 2019-02-11 NOTE — ED Notes (Signed)
Pt sons updated

## 2019-02-11 NOTE — ED Provider Notes (Signed)
[facility] EMERGENCY DEPARTMENT Provider Note   CSN: [id number] Arrival date & time: 02/11/19  0135   History Chief Complaint  Patient presents with  . Weakness    [name] is a 86 y.o. female.  The history is provided by the nursing home. The history is limited by the condition of the patient (Dementia).  Weakness She has history of hypertension, dementia and is sent from skilled nursing facility because of weakness.  Apparently, she is normally ambulatory and has not been able to ambulate today.  She is reported to have had a COVID-19 test 2 days ago, results pending.  Patient has no complaints and does not know why she is here.  Past Medical History:  Diagnosis Date  . Anxiety   . Dementia (HCC)   . GERD (gastroesophageal reflux disease)   . Goiter   . Hypertension   . Osteoarthritis   . Osteoporosis   . Vitamin D insufficiency     Patient Active Problem List   Diagnosis Date Noted  . Chest pain 05/14/2016  . Goiter 05/14/2016  . Intertrochanteric fracture of right hip (HCC) 11/19/2012  . UTI (urinary tract infection) 11/19/2012  . Dizziness and giddiness 06/14/2012  . HTN (hypertension) 06/14/2012  . Difficulty in walking(719.7) 09/09/2011  . Osteoarthritis of leg 09/09/2011  . Abnormality of gait 09/09/2011  . Pain in joint, lower leg 09/09/2011    Past Surgical History:  Procedure Laterality Date  . APPENDECTOMY    . KIDNEY STONE SURGERY    . ORIF HIP FRACTURE Right 11/20/2012   Procedure: OPEN REDUCTION INTERNAL FIXATION RIGHT HIP;  Surgeon: Darreld Mclean, MD;  Location: [facility];  Service: Orthopedics;  Laterality: Right;  . stress fractures of legs    . TUBAL LIGATION       OB History   No obstetric history on file.     Family History  Problem Relation Age of Onset  . Heart attack Father   . Heart disease Neg Hx     Social History   Tobacco Use  . Smoking status: Never Smoker  . Smokeless tobacco: Never Used  Substance Use Topics  .  Alcohol use: No  . Drug use: No    Home Medications Prior to Admission medications   Medication Sig Start Date End Date Taking? Authorizing Provider  acetaminophen (TYLENOL) 500 MG tablet Take 1,000 mg by mouth 3 (three) times daily. For pain   Yes [provider]  amLODipine (NORVASC) 5 MG tablet Take 1 tablet (5 mg total) by mouth at bedtime. 05/15/16  Yes Carylon Perches, MD  dextromethorphan-guaiFENesin (TUSSIN DM) 10-100 MG/5ML liquid Take 10 mLs by mouth every 6 (six) hours as needed for cough.   Yes [provider]  hydrochlorothiazide (MICROZIDE) 12.5 MG capsule Take 12.5 mg by mouth every morning.   Yes [provider]  loperamide (IMODIUM) 2 MG capsule Take 2 mg by mouth every 4 (four) hours as needed for diarrhea or loose stools.   Yes [provider]  losartan (COZAAR) 100 MG tablet Take 100 mg by mouth daily.   Yes [provider]  omeprazole (PRILOSEC) 40 MG capsule Take 40 mg by mouth daily.   Yes [provider]  donepezil (ARICEPT) 5 MG tablet Take 1 tablet (5 mg total) by mouth at bedtime. Patient taking differently: Take 10 mg by mouth at bedtime.  05/15/16   Carylon Perches, MD    Allergies    Hydrocodone-acetaminophen  Review of Systems  Review of Systems  Unable to perform ROS: Dementia  Neurological: Positive for weakness.    Physical Exam Updated Vital Signs BP (!) 145/63 (BP Location: Right Arm)   Pulse 67   Temp 99.5 F (37.5 C) (Oral)   Resp 14   Ht 5\' 3"  (1.6 m)   Wt 56.7 kg   SpO2 (!) 86%   BMI 22.14 kg/m   Physical Exam Vitals and nursing note reviewed.   86 year old female, resting comfortably and in no acute distress. Vital signs are significant for borderline elevated blood pressure. Oxygen saturation is 86%, which is hypoxic. Head is normocephalic and atraumatic. PERRLA, EOMI. Oropharynx is clear. Neck is nontender and supple without adenopathy or JVD. Back is nontender and there is no CVA  tenderness. Lungs are clear without rales, wheezes, or rhonchi. Chest is nontender. Heart has regular rate and rhythm without murmur. Abdomen is soft, flat, nontender without masses or hepatosplenomegaly and peristalsis is normoactive. Extremities have no cyanosis or edema, full range of motion is present. Skin is warm and dry without rash. Neurologic: Awake and alert, oriented to person but not place or time, cranial nerves are intact, there are no motor or sensory deficits.  ED Results / Procedures / Treatments   Labs (all labs ordered are listed, but only abnormal results are displayed) Labs Reviewed  COMPREHENSIVE METABOLIC PANEL - Abnormal; Notable for the following components:      Result Value   Sodium 134 (*)    Potassium 3.1 (*)    Glucose, Bld 112 (*)    BUN 25 (*)    Calcium 8.6 (*)    Albumin 3.3 (*)    All other components within normal limits  CBC WITH DIFFERENTIAL/PLATELET - Abnormal; Notable for the following components:   Lymphs Abs 0.6 (*)    All other components within normal limits  URINALYSIS, ROUTINE W REFLEX MICROSCOPIC - Abnormal; Notable for the following components:   APPearance HAZY (*)    Hgb urine dipstick SMALL (*)    Protein, ur 30 (*)    All other components within normal limits  CULTURE, BLOOD (ROUTINE X 2)  CULTURE, BLOOD (ROUTINE X 2)  POC SARS CORONAVIRUS 2 AG -  ED  TROPONIN I (HIGH SENSITIVITY)  TROPONIN I (HIGH SENSITIVITY)    EKG EKG Interpretation  Date/Time:  Thursday February 11 2019 02:56:54 EST Ventricular Rate:  67 PR Interval:    QRS Duration: 128 QT Interval:  444 QTC Calculation: 469 R Axis:   -58 Text Interpretation: Age not entered, assumed to be  86 years old for purpose of ECG interpretation Sinus rhythm RBBB and LAFB When compared with ECG of 05/15/2016, No significant change was found Confirmed by 07/15/2016 (Dione Booze) on 02/11/2019 3:21:22 AM   Radiology DG Chest Port 1 View  Result Date: 02/11/2019 CLINICAL DATA:   86 year old female with weakness. EXAM: PORTABLE CHEST 1 VIEW COMPARISON:  Chest radiograph dated 14 18. FINDINGS: Bilateral perihilar and bibasilar streaky densities concerning for developing infiltrate. There is blunting of the costophrenic angles which may represent small pleural effusions. No pneumothorax. Stable cardiac silhouette. Atherosclerotic calcification of the aorta. No acute osseous pathology. IMPRESSION: 1. Bilateral perihilar and bibasilar streaky densities concerning for developing infiltrate. Follow-up recommended. 2. Probable small bilateral pleural effusions. Electronically Signed   By: 07-03-1993 M.D.   On: 02/11/2019 02:41    Procedures Procedures   Medications Ordered in ED Medications  cefTRIAXone (ROCEPHIN) 2 g in sodium chloride 0.9 %  100 mL IVPB (has no administration in time range)  azithromycin (ZITHROMAX) 500 mg in sodium chloride 0.9 % 250 mL IVPB (has no administration in time range)    ED Course  I have reviewed the triage vital signs and the nursing notes.  Pertinent labs & imaging results that were available during my care of the patient were reviewed by me and considered in my medical decision making (see chart for details).  MDM Rules/Calculators/A&P Generalized weakness, cause unclear.  Hypoxia.  Will check screening labs and urinalysis as well as chest x-ray.  Old records are reviewed, and she does have an ED visit 6 years ago for weakness, no etiology found.  Labs are significant for hypokalemia.  Urinalysis shows no evidence of infection.  ECG is unchanged from baseline.  Chest x-ray appears to show bilateral infiltrates consistent with pneumonia.  She is started on antibiotics for pneumonia.  She is taken off of oxygen and desaturates down to 86%, she is put back on nasal oxygen.  Because of new oxygen requirement in the setting of pneumonia, possibly COVID-19 pneumonia, decision is made to admit.  Case is discussed with Dr. Darrick Meigs of [facility], who agrees to admit the patient.  [name] was evaluated in Emergency Department on 02/11/2019 for the symptoms described in the history of present illness. She was evaluated in the context of the global COVID-19 pandemic, which necessitated consideration that the patient might be at risk for infection with the SARS-CoV-2 virus that causes COVID-19. Institutional protocols and algorithms that pertain to the evaluation of patients at risk for COVID-19 are in a state of rapid change based on information released by regulatory bodies including the CDC and federal and state organizations. These policies and algorithms were followed during the patient's care in the ED.  Final Clinical Impression(s) / ED Diagnoses Final diagnoses:  Weakness  Community acquired pneumonia, unspecified laterality  Hypoxia  Hypokalemia    Rx / DC Orders ED Discharge Orders    None       Delora Fuel, MD 43/15/40 [phone]

## 2019-02-11 NOTE — Plan of Care (Signed)
  Problem: Clinical Measurements: Goal: Ability to maintain a body temperature in the normal range will improve Outcome: Progressing   Problem: Education: Goal: Knowledge of General Education information will improve Description: Including pain rating scale, medication(s)/side effects and non-pharmacologic comfort measures Outcome: Progressing   Problem: Pain Managment: Goal: General experience of comfort will improve Outcome: Progressing   Problem: Safety: Goal: Ability to remain free from injury will improve Outcome: Progressing

## 2019-02-11 NOTE — ED Notes (Signed)
Pt 84% on RA Placed patient on 3L , oxygen saturation 93-96%

## 2019-02-11 NOTE — ED Notes (Signed)
[name] and [name] have been made aware of admission.

## 2019-02-11 NOTE — H&P (Signed)
[facility] H&P    Patient Demographics:    [name], is a 86 y.o. female  MRN: [id number]  DOB - 07-27-1928  Admit Date - 02/11/2019  Referring MD/NP/PA: Dr. Roxanne Mins  Outpatient Primary MD for the patient is Asencion Noble, MD  Patient coming from: Skilled nursing facility  Chief complaint-generalized weakness   HPI:    [name]  is a 86 y.o. female, with history of hypertension, dementia, osteoporosis was sent from the skilled facility for complaints of generalized weakness.  Patient is normally ambulatory and has not been able to ambulate today.  She reportedly had COVID-19 test 2 days ago, result is currently pending.  In the ED patient was found to be hypoxic with O2 sats 86% on room air.  Chest x-ray showed bilateral perihilar and bibasilar streaky densities concerning for developing infiltrate.  Patient started on ceftriaxone and Zithromax. She denies nausea vomiting or diarrhea. Denies abdominal pain or dysuria. Denies chest pain. Patient is a poor historian due to history of underlying dementia.    Review of systems:    In addition to the HPI above,    All other systems reviewed and are negative.    Past History of the following :    Past Medical History:  Diagnosis Date  . Anxiety   . Dementia ([place])   . GERD (gastroesophageal reflux disease)   . Goiter   . Hypertension   . Osteoarthritis   . Osteoporosis   . Vitamin D insufficiency       Past Surgical History:  Procedure Laterality Date  . APPENDECTOMY    . KIDNEY STONE SURGERY    . ORIF HIP FRACTURE Right 11/20/2012   Procedure: OPEN REDUCTION INTERNAL FIXATION RIGHT HIP;  Surgeon: Sanjuana Kava, MD;  Location: [facility];  Service: Orthopedics;  Laterality: Right;  . stress fractures of legs    . TUBAL LIGATION        Social History:      Social History   Tobacco Use  . Smoking status: Never Smoker  . Smokeless tobacco:  Never Used  Substance Use Topics  . Alcohol use: No       Family History :     Family History  Problem Relation Age of Onset  . Heart attack Father   . Heart disease Neg Hx       Home Medications:   Prior to Admission medications   Medication Sig Start Date End Date Taking? Authorizing Provider  acetaminophen (TYLENOL) 500 MG tablet Take 1,000 mg by mouth 3 (three) times daily. For pain   Yes [provider]  amLODipine (NORVASC) 5 MG tablet Take 1 tablet (5 mg total) by mouth at bedtime. 05/15/16  Yes Asencion Noble, MD  dextromethorphan-guaiFENesin (TUSSIN DM) 10-100 MG/5ML liquid Take 10 mLs by mouth every 6 (six) hours as needed for cough.   Yes [provider]  hydrochlorothiazide (MICROZIDE) 12.5 MG capsule Take 12.5 mg by mouth every morning.   Yes [provider]  loperamide (IMODIUM) 2 MG capsule Take 2 mg by  mouth every 4 (four) hours as needed for diarrhea or loose stools.   Yes [provider]  losartan (COZAAR) 100 MG tablet Take 100 mg by mouth daily.   Yes [provider]  omeprazole (PRILOSEC) 40 MG capsule Take 40 mg by mouth daily.   Yes [provider]  donepezil (ARICEPT) 5 MG tablet Take 1 tablet (5 mg total) by mouth at bedtime. Patient taking differently: Take 10 mg by mouth at bedtime.  05/15/16   Asencion Noble, MD     Allergies:     Allergies  Allergen Reactions  . Hydrocodone-Acetaminophen Nausea And Vomiting     Physical Exam:   Vitals  Blood pressure (!) 145/63, pulse 73, temperature 99.5 F (37.5 C), temperature source Oral, resp. rate 14, height '5\' 3"'$  (1.6 m), weight 56.7 kg, SpO2 93 %.  1.  General: Appears in no acute distress  2. Psychiatric: Alert, oriented to self only  3. Neurologic: Cranial nerves II through XII grossly intact, moving all extremities  4. HEENMT:  Atraumatic normocephalic, extraocular muscles are intact  5. Respiratory : Clear to auscultation bilaterally  6.  Cardiovascular : S1-S2, regular, no murmur auscultated  7. Gastrointestinal:  Abdomen is soft, nontender, no organomegaly      Data Review:    CBC Recent Labs  Lab 02/11/19 0241  WBC 4.2  HGB 12.9  HCT 39.8  PLT 211  MCV 96.4  MCH 31.2  MCHC 32.4  RDW 13.3  LYMPHSABS 0.6*  MONOABS 0.4  EOSABS 0.0  BASOSABS 0.0   ------------------------------------------------------------------------------------------------------------------  Results for orders placed or performed during the hospital encounter of 02/11/19 (from the past 48 hour(s))  Comprehensive metabolic panel     Status: Abnormal   Collection Time: 02/11/19  2:41 AM  Result Value Ref Range   Sodium 134 (L) 135 - 145 mmol/L   Potassium 3.1 (L) 3.5 - 5.1 mmol/L   Chloride 98 98 - 111 mmol/L   CO2 26 22 - 32 mmol/L   Glucose, Bld 112 (H) 70 - 99 mg/dL   BUN 25 (H) 8 - 23 mg/dL   Creatinine, Ser 0.83 0.44 - 1.00 mg/dL   Calcium 8.6 (L) 8.9 - 10.3 mg/dL   Total Protein 7.3 6.5 - 8.1 g/dL   Albumin 3.3 (L) 3.5 - 5.0 g/dL   AST 19 15 - 41 U/L   ALT 11 0 - 44 U/L   Alkaline Phosphatase 43 38 - 126 U/L   Total Bilirubin 0.7 0.3 - 1.2 mg/dL   GFR calc non Af Amer >60 >60 mL/min   GFR calc Af Amer >60 >60 mL/min   Anion gap 10 5 - 15    Comment: Performed at [facility], [address] [place], [place] [address]  CBC with Differential     Status: Abnormal   Collection Time: 02/11/19  2:41 AM  Result Value Ref Range   WBC 4.2 4.0 - 10.5 K/uL   RBC 4.13 3.87 - 5.11 MIL/uL   Hemoglobin 12.9 12.0 - 15.0 g/dL   HCT 39.8 36.0 - 46.0 %   MCV 96.4 80.0 - 100.0 fL   MCH 31.2 26.0 - 34.0 pg   MCHC 32.4 30.0 - 36.0 g/dL   RDW 13.3 11.5 - 15.5 %   Platelets 211 150 - 400 K/uL   nRBC 0.0 0.0 - 0.2 %   Neutrophils Relative % 74 %   Neutro Abs 3.1 1.7 - 7.7 K/uL   Lymphocytes Relative 15 %  Lymphs Abs 0.6 (L) 0.7 - 4.0 K/uL   Monocytes Relative 11 %   Monocytes Absolute 0.4 0.1 - 1.0 K/uL   Eosinophils Relative 0 %     Eosinophils Absolute 0.0 0.0 - 0.5 K/uL   Basophils Relative 0 %   Basophils Absolute 0.0 0.0 - 0.1 K/uL   Immature Granulocytes 0 %   Abs Immature Granulocytes 0.01 0.00 - 0.07 K/uL    Comment: Performed at [facility], [address], [place], [place] [address]  Troponin I (High Sensitivity)     Status: None   Collection Time: 02/11/19  2:41 AM  Result Value Ref Range   Troponin I (High Sensitivity) 7 <18 ng/L    Comment: (NOTE) Elevated high sensitivity troponin I (hsTnI) values and significant  changes across serial measurements may suggest ACS but many other  chronic and acute conditions are known to elevate hsTnI results.  Refer to the "Links" section for chest pain algorithms and additional  guidance. Performed at [facility], [address] [place], [place] [address]   Urinalysis, Routine w reflex microscopic     Status: Abnormal   Collection Time: 02/11/19  3:33 AM  Result Value Ref Range   Color, Urine YELLOW YELLOW   APPearance HAZY (A) CLEAR   Specific Gravity, Urine 1.029 1.005 - 1.030   pH 5.0 5.0 - 8.0   Glucose, UA NEGATIVE NEGATIVE mg/dL   Hgb urine dipstick SMALL (A) NEGATIVE   Bilirubin Urine NEGATIVE NEGATIVE   Ketones, ur NEGATIVE NEGATIVE mg/dL   Protein, ur 30 (A) NEGATIVE mg/dL   Nitrite NEGATIVE NEGATIVE   Leukocytes,Ua NEGATIVE NEGATIVE   RBC / HPF 0-5 0 - 5 RBC/hpf   WBC, UA 0-5 0 - 5 WBC/hpf   Bacteria, UA NONE SEEN NONE SEEN   Squamous Epithelial / LPF 0-5 0 - 5   Mucus PRESENT     Comment: Performed at [facility], [address] [place], [place] [address]  POC SARS Coronavirus 2 Ag-ED - Nasal Swab (BD Veritor Kit)     Status: None   Collection Time: 02/11/19  5:33 AM  Result Value Ref Range   SARS Coronavirus 2 Ag NEGATIVE NEGATIVE    Comment: (NOTE) SARS-CoV-2 antigen NOT DETECTED.  Negative results are presumptive.  Negative results do not preclude SARS-CoV-2 infection and should not be used as the sole basis for treatment or other  patient management decisions, including infection  control decisions, particularly in the presence of clinical signs and  symptoms consistent with COVID-19, or in those who have been in contact with the virus.  Negative results must be combined with clinical observations, patient history, and epidemiological information. The expected result is Negative. Fact Sheet for Patients: PodPark.tn Fact Sheet for Healthcare Providers: GiftContent.is This test is not yet approved or cleared by the [place] FDA and  has been authorized for detection and/or diagnosis of SARS-CoV-2 by FDA under an Emergency Use Authorization (EUA).  This EUA will remain in effect (meaning this test can be used) for the duration of  the COVID-19 de claration under Section 564(b)(1) of the Act, 21 U.S.C. section 360bbb-3(b)(1), unless the authorization is terminated or revoked sooner.     Chemistries  Recent Labs  Lab 02/11/19 0241  NA 134*  K 3.1*  CL 98  CO2 26  GLUCOSE 112*  BUN 25*  CREATININE 0.83  CALCIUM 8.6*  AST 19  ALT 11  ALKPHOS 43  BILITOT 0.7   ------------------------------------------------------------------------------------------------------------------  ------------------------------------------------------------------------------------------------------------------ GFR:  Estimated Creatinine Clearance: 38.8 mL/min (by C-G formula based on SCr of 0.83 mg/dL). Liver Function Tests: Recent Labs  Lab 02/11/19 0241  AST 19  ALT 11  ALKPHOS 43  BILITOT 0.7  PROT 7.3  ALBUMIN 3.3*   No results for input(s): LIPASE, AMYLASE in the last 168 hours. No results for input(s): AMMONIA in the last 168 hours. Coagulation Profile: No results for input(s): INR, PROTIME in the last 168 hours. Cardiac Enzymes: No results for input(s): CKTOTAL, CKMB, CKMBINDEX, TROPONINI in the last 168 hours. BNP (last 3 results) No results for  input(s): PROBNP in the last 8760 hours. HbA1C: No results for input(s): HGBA1C in the last 72 hours. CBG: No results for input(s): GLUCAP in the last 168 hours. Lipid Profile: No results for input(s): CHOL, HDL, LDLCALC, TRIG, CHOLHDL, LDLDIRECT in the last 72 hours. Thyroid Function Tests: No results for input(s): TSH, T4TOTAL, FREET4, T3FREE, THYROIDAB in the last 72 hours. Anemia Panel: No results for input(s): VITAMINB12, FOLATE, FERRITIN, TIBC, IRON, RETICCTPCT in the last 72 hours.  --------------------------------------------------------------------------------------------------------------- Urine analysis:    Component Value Date/Time   COLORURINE YELLOW 02/11/2019 0333   APPEARANCEUR HAZY (A) 02/11/2019 0333   LABSPEC 1.029 02/11/2019 0333   PHURINE 5.0 02/11/2019 0333   GLUCOSEU NEGATIVE 02/11/2019 0333   HGBUR SMALL (A) 02/11/2019 0333   BILIRUBINUR NEGATIVE 02/11/2019 0333   KETONESUR NEGATIVE 02/11/2019 0333   PROTEINUR 30 (A) 02/11/2019 0333   UROBILINOGEN 0.2 11/05/2014 2333   NITRITE NEGATIVE 02/11/2019 0333   LEUKOCYTESUR NEGATIVE 02/11/2019 0333      Imaging Results:    DG Chest Port 1 View  Result Date: 02/11/2019 CLINICAL DATA:  86 year old female with weakness. EXAM: PORTABLE CHEST 1 VIEW COMPARISON:  Chest radiograph dated 14 18. FINDINGS: Bilateral perihilar and bibasilar streaky densities concerning for developing infiltrate. There is blunting of the costophrenic angles which may represent small pleural effusions. No pneumothorax. Stable cardiac silhouette. Atherosclerotic calcification of the aorta. No acute osseous pathology. IMPRESSION: 1. Bilateral perihilar and bibasilar streaky densities concerning for developing infiltrate. Follow-up recommended. 2. Probable small bilateral pleural effusions. Electronically Signed   By: Anner Crete M.D.   On: 02/11/2019 02:41    My personal review of EKG: Rhythm NSR, no ST-T changes   Assessment & Plan:     Active Problems:   CAP (community acquired pneumonia)   1. Current acquired pneumonia-patient has new oxygen requirement requiring 2 L/min of oxygen.  COVID-19 test has been obtained, result is currently pending.  Patient started on ceftriaxone and Zithromax.  We will continue with antibiotics.  Follow COVID-19 test results.  2. Hypokalemia-potassium is 3.1, replace potassium and follow BMP in a.m.  3. Hypertension-continue amlodipine, Cozaar, hold hydrochlorothiazide due to hypokalemia 4. Dementia-no behavioral disturbance, continue Aricept.   DVT Prophylaxis-   Lovenox   AM Labs Ordered, also please review Full Orders  Family Communication: Tried to call patient's family, unable to contact.  Code Status: Presumed full code  Admission status:Inpatient: Based on patients clinical presentation and evaluation of above clinical data, I have made determination that patient meets Inpatient criteria at this time.  Time spent in minutes : 60 minutes   Avaiah Stempel S Kenny Stern M.D

## 2019-02-11 NOTE — ED Notes (Signed)
Respiratory contacted for neb treatment

## 2019-02-11 NOTE — ED Triage Notes (Signed)
Pt brought in via EMS from [facility] after staff reported pt with increased weakness (usually able to walk and cannot now). States pt was tested [facility] for Covid but that results are not back yet.

## 2019-02-11 NOTE — Progress Notes (Signed)
Patient admitted to the hospital earlier this morning by Dr. Sharl Ma.  Patient seen and examined.  She has some coarse breath sounds at bases.  Overall she is feeling better.  She is currently on oxygen.  86 year old female with history of dementia, hypertension, who is a resident of an assisted living facility, presented to emergency room with complaints of generalized weakness.  She was noted to be hypoxic with chest x-ray indicated possible pneumonia.  1. Acute respiratory failure secondary to community-acquired pneumonia.  Started on intravenous antibiotics.  Titrate oxygen down as tolerated.  COVID-19 test is negative. 2. Hypokalemia.  Replace 3. Hypertension.  Continue on amlodipine and Cozaar.  Hydrochlorothiazide on hold 4. Dementia.  Continue Aricept 5. Generalized weakness.  Physical therapy evaluation  [employer]

## 2019-02-11 NOTE — ED Notes (Signed)
ED Provider at bedside. 

## 2019-02-12 DIAGNOSIS — F0390 Unspecified dementia without behavioral disturbance: Secondary | ICD-10-CM | POA: Diagnosis present

## 2019-02-12 DIAGNOSIS — J9601 Acute respiratory failure with hypoxia: Secondary | ICD-10-CM

## 2019-02-12 LAB — BASIC METABOLIC PANEL
Anion gap: 10 (ref 5–15)
BUN: 16 mg/dL (ref 8–23)
CO2: 26 mmol/L (ref 22–32)
Calcium: 8.5 mg/dL — ABNORMAL LOW (ref 8.9–10.3)
Chloride: 102 mmol/L (ref 98–111)
Creatinine, Ser: 0.72 mg/dL (ref 0.44–1.00)
GFR calc Af Amer: >60 mL/min (ref >60)
GFR calc non Af Amer: >60 mL/min (ref >60)
Glucose, Bld: 105 mg/dL — ABNORMAL HIGH (ref 70–99)
Potassium: 3.4 mmol/L — ABNORMAL LOW (ref 3.5–5.1)
Sodium: 138 mmol/L (ref 135–145)

## 2019-02-12 LAB — CBC
HCT: 38.1 % (ref 36.0–46.0)
Hemoglobin: 12.1 g/dL (ref 12.0–15.0)
MCH: 30.5 pg (ref 26.0–34.0)
MCHC: 31.8 g/dL (ref 30.0–36.0)
MCV: 96 fL (ref 80.0–100.0)
Platelets: 181 10*3/uL (ref 150–400)
RBC: 3.97 MIL/uL (ref 3.87–5.11)
RDW: 13.2 % (ref 11.5–15.5)
WBC: 5.7 10*3/uL (ref 4.0–10.5)
nRBC: 0 % (ref 0.0–0.2)

## 2019-02-12 LAB — MRSA PCR SCREENING: MRSA by PCR: NEGATIVE

## 2019-02-12 LAB — PROCALCITONIN: Procalcitonin: <0.1 ng/mL

## 2019-02-12 MED ORDER — POTASSIUM CHLORIDE 20 MEQ PO PACK
40.0000 meq | PACK | Freq: Once | ORAL | Status: AC
  Administered 2019-02-12: 40 meq via ORAL
  Filled 2019-02-12: qty 2

## 2019-02-12 MED ORDER — ACETAMINOPHEN 325 MG PO TABS
650.0000 mg | ORAL_TABLET | Freq: Four times a day (QID) | ORAL | Status: DC | PRN
  Administered 2019-02-12: 650 mg via ORAL
  Filled 2019-02-12: qty 2

## 2019-02-12 NOTE — Progress Notes (Signed)
PROGRESS NOTE    [name]  AUQ:[id number] DOB: 03/29/1928 DOA: 02/11/2019 PCP: Carylon Perches, MD    Brief Narrative:   [name]  is a 86 y.o. female, with history of hypertension, dementia, osteoporosis was sent from the skilled facility for complaints of generalized weakness and found to have pneumonia   Per son at bedside, patient was able to move around with a walker prior to onset of pandemic. Since the pandemic, her room mate (who was more ambulatory, helping the patient around and gave her company) has been moved to a different room and her food is being brought to her in room (which was not the case before). Her family has not been able to visit her in person. All these changes, per son has led to decreased mobility and depression. He reports a decline in mentation and overall activity for the last several months.   Assessment & Plan:   Active Problems:   CAP (community acquired pneumonia) Non active medical problems:  Dementia HTN osteoporosis  Acute hypoxic respiratory failure requiring oxygen via [place] Secondary to community acquired pneumonia  COVID 19, influenza A/B negative.  CXR with opacity suggestive of developing infiltrate  Continue ceftriaxone, azithromycin for now as she is hemodynamically stable, no leukocytosis. Incase of poor response, will consider broadening antibiotic spectrum as the patient is a nursing home resident.   Dementia Continue aricept   HTN Controlled Continue losartan  HCTZ held due to poor po intake   DVT prophylaxis: Lovenox SQ Code Status: DNR  Family Communication: updated son at bedside  Disposition Plan: back to SNF pending medical recovery    Consultants:   None   Procedures:  None   Antimicrobials:   Ceftriaxone, azithromycin     Subjective: Patient mumbles. Unable to comprehend. But says yes when asked if she feels better than yesterday. Reports feeling tired.   Objective: Vitals:   02/11/19 1730 02/11/19  1900 02/11/19 1939 02/12/19 0355  BP: (!) 140/57 (!) 153/56  (!) 141/70  Pulse: 73 69  88  Resp: (!) 25 (!) 21  (!) 22  Temp:  97.7 F (36.5 C)  (!) 100.4 F (38 C)  TempSrc:  Oral  Axillary  SpO2: 95% 98% 96% 91%  Weight:  56.7 kg    Height:  5\' 3"  (1.6 m)      Intake/Output Summary (Last 24 hours) at 02/12/2019 04/12/2019 Last data filed at 02/12/2019 0600 Gross per 24 hour  Intake 1311.95 ml  Output --  Net 1311.95 ml   Filed Weights   02/11/19 0152 02/11/19 1900  Weight: 56.7 kg 56.7 kg    Examination:  General exam: frail, elderly, tired appearing  Respiratory system: Clear to auscultation on anterior auscultation, tachypnic, no accessory muscle use Cardiovascular system: S1 & S2 heard, RRR. No JVD. No pedal edema. Gastrointestinal system: Abdomen is nondistended, soft and nontender. Normal bowel sounds heard. Central nervous system: somnolent, follows commands intermittently when awakens  Extremities: no LE edema. Moves bilateral UE voluntarily  Skin: No obvious rashes, lesions or ulcers Psychiatry: unable to assess    Data Reviewed: I have personally reviewed following labs and imaging studies  CBC: Recent Labs  Lab 02/11/19 0241 02/12/19 0704  WBC 4.2 5.7  NEUTROABS 3.1  --   HGB 12.9 12.1  HCT 39.8 38.1  MCV 96.4 96.0  PLT 211 181   Basic Metabolic Panel: Recent Labs  Lab 02/11/19 0241 02/12/19 0704  NA 134* 138  K 3.1* 3.4*  CL 98 102  CO2 26 26  GLUCOSE 112* 105*  BUN 25* 16  CREATININE 0.83 0.72  CALCIUM 8.6* 8.5*   GFR: Estimated Creatinine Clearance: 40.2 mL/min (by C-G formula based on SCr of 0.72 mg/dL). Liver Function Tests: Recent Labs  Lab 02/11/19 0241  AST 19  ALT 11  ALKPHOS 43  BILITOT 0.7  PROT 7.3  ALBUMIN 3.3*   No results for input(s): LIPASE, AMYLASE in the last 168 hours. No results for input(s): AMMONIA in the last 168 hours. Coagulation Profile: No results for input(s): INR, PROTIME in the last 168 hours. Cardiac  Enzymes: No results for input(s): CKTOTAL, CKMB, CKMBINDEX, TROPONINI in the last 168 hours. BNP (last 3 results) No results for input(s): PROBNP in the last 8760 hours. HbA1C: No results for input(s): HGBA1C in the last 72 hours. CBG: No results for input(s): GLUCAP in the last 168 hours. Lipid Profile: No results for input(s): CHOL, HDL, LDLCALC, TRIG, CHOLHDL, LDLDIRECT in the last 72 hours. Thyroid Function Tests: No results for input(s): TSH, T4TOTAL, FREET4, T3FREE, THYROIDAB in the last 72 hours. Anemia Panel: No results for input(s): VITAMINB12, FOLATE, FERRITIN, TIBC, IRON, RETICCTPCT in the last 72 hours. Sepsis Labs: No results for input(s): PROCALCITON, LATICACIDVEN in the last 168 hours.  Recent Results (from the past 240 hour(s))  Culture, blood (routine x 2)     Status: None (Preliminary result)   Collection Time: 02/11/19  4:50 AM   Specimen: BLOOD  Result Value Ref Range Status   Specimen Description BLOOD RIGHT ANTECUBITAL  Final   Special Requests   Final    BOTTLES DRAWN AEROBIC ONLY Blood Culture adequate volume   Culture   Final    NO GROWTH 1 DAY Performed at [facility], [address] [place], [place] [address]    Report Status PENDING  Incomplete  Culture, blood (routine x 2)     Status: None (Preliminary result)   Collection Time: 02/11/19  5:34 AM   Specimen: BLOOD  Result Value Ref Range Status   Specimen Description BLOOD BLOOD RIGHT HAND  Final   Special Requests   Final    BOTTLES DRAWN AEROBIC AND ANAEROBIC Blood Culture adequate volume   Culture   Final    NO GROWTH 1 DAY Performed at [facility], [address] [place], [place] [address]    Report Status PENDING  Incomplete  Respiratory Panel by RT PCR (Flu A&B, Covid) - Nasopharyngeal Swab     Status: None   Collection Time: 02/11/19  5:41 AM   Specimen: Nasopharyngeal Swab  Result Value Ref Range Status   SARS Coronavirus 2 by RT PCR NEGATIVE NEGATIVE Final    Comment:  (NOTE) SARS-CoV-2 target nucleic acids are NOT DETECTED. The SARS-CoV-2 RNA is generally detectable in upper respiratoy specimens during the acute phase of infection. The lowest concentration of SARS-CoV-2 viral copies this assay can detect is 131 copies/mL. A negative result does not preclude SARS-Cov-2 infection and should not be used as the sole basis for treatment or other patient management decisions. A negative result may occur with  improper specimen collection/handling, submission of specimen other than nasopharyngeal swab, presence of viral mutation(s) within the areas targeted by this assay, and inadequate number of viral copies (<131 copies/mL). A negative result must be combined with clinical observations, patient history, and epidemiological information. The expected result is Negative. Fact Sheet for Patients:  https://www.moore.com/ Fact Sheet for Healthcare Providers:  https://www.young.biz/ This test is not yet ap  proved or cleared by the [place] and  has been authorized for detection and/or diagnosis of SARS-CoV-2 by FDA under an Emergency Use Authorization (EUA). This EUA will remain  in effect (meaning this test can be used) for the duration of the COVID-19 declaration under Section 564(b)(1) of the Act, 21 U.S.C. section 360bbb-3(b)(1), unless the authorization is terminated or revoked sooner.    Influenza A by PCR NEGATIVE NEGATIVE Final   Influenza B by PCR NEGATIVE NEGATIVE Final    Comment: (NOTE) The Xpert Xpress SARS-CoV-2/FLU/RSV assay is intended as an aid in  the diagnosis of influenza from Nasopharyngeal swab specimens and  should not be used as a sole basis for treatment. Nasal washings and  aspirates are unacceptable for Xpert Xpress SARS-CoV-2/FLU/RSV  testing. Fact Sheet for Patients: PinkCheek.be Fact Sheet for Healthcare  Providers: GravelBags.it This test is not yet approved or cleared by the [place] FDA and  has been authorized for detection and/or diagnosis of SARS-CoV-2 by  FDA under an Emergency Use Authorization (EUA). This EUA will remain  in effect (meaning this test can be used) for the duration of the  Covid-19 declaration under Section 564(b)(1) of the Act, 21  U.S.C. section 360bbb-3(b)(1), unless the authorization is  terminated or revoked. Performed at [facility], [address], [place], [place] [address]   MRSA PCR Screening     Status: None   Collection Time: 02/11/19  9:21 PM   Specimen: Nasal Mucosa; Nasopharyngeal  Result Value Ref Range Status   MRSA by PCR NEGATIVE NEGATIVE Final    Comment:        The GeneXpert MRSA Assay (FDA approved for NASAL specimens only), is one component of a comprehensive MRSA colonization surveillance program. It is not intended to diagnose MRSA infection nor to guide or monitor treatment for MRSA infections. Performed at [facility], [address], [place], [place] [address]          Radiology Studies: [facility] Chest [place] 1 View  Result Date: 02/11/2019 CLINICAL DATA:  86 year old female with weakness. EXAM: PORTABLE CHEST 1 VIEW COMPARISON:  Chest radiograph dated 14 18. FINDINGS: Bilateral perihilar and bibasilar streaky densities concerning for developing infiltrate. There is blunting of the costophrenic angles which may represent small pleural effusions. No pneumothorax. Stable cardiac silhouette. Atherosclerotic calcification of the aorta. No acute osseous pathology. IMPRESSION: 1. Bilateral perihilar and bibasilar streaky densities concerning for developing infiltrate. Follow-up recommended. 2. Probable small bilateral pleural effusions. Electronically Signed   By: Anner Crete M.D.   On: 02/11/2019 02:41        Scheduled Meds: . amLODipine  5 mg Oral QHS  . donepezil  5 mg Oral QHS  . enoxaparin  (LOVENOX) injection  40 mg Subcutaneous Q24H  . ipratropium-albuterol  3 mL Nebulization BID  . losartan  100 mg Oral Daily  . pantoprazole  40 mg Oral Daily   Continuous Infusions: . azithromycin 500 mg (02/12/19 0450)  . cefTRIAXone (ROCEPHIN)  IV 1 g (02/12/19 0358)     LOS: 1 day    Time spent: Spent more than 30 minutes in coordinating care for this patient including bedside patient care.   Lucky Cowboy, MD [facility] If 7PM-7AM, please contact night-coverage 02/12/2019, 8:07 AM

## 2019-02-12 NOTE — Progress Notes (Signed)
Dr. Sharl Ma notified that patient has temperature and no tylenol ordered.  Blankets removed.

## 2019-02-12 NOTE — Progress Notes (Signed)
Upon entering room patient had nasal cannula under chin. RT checked sats patients sats were 84%. RT placed cannula back in patients nose and sats improved to 94%.

## 2019-02-12 NOTE — Care Management Important Message (Signed)
Important Message  Patient Details  Name: [name] MRN: [id number] Date of Birth: Nov 29, 1928   Medicare Important Message Given:  Yes(Shannon, RN will deliver letter to patient)     Corey Harold 02/12/2019, 3:11 PM

## 2019-02-12 NOTE — Plan of Care (Signed)
  Problem: Acute Rehab PT Goals(only PT should resolve) Goal: Pt Will Go Supine/Side To Sit Outcome: Progressing Flowsheets (Taken 02/12/2019 0946) Pt will go Supine/Side to Sit: with minimal assist Goal: Patient Will Perform Sitting Balance Outcome: Progressing Flowsheets (Taken 02/12/2019 0946) Patient will perform sitting balance: with minimal assist Goal: Patient Will Transfer Sit To/From Stand Outcome: Progressing Flowsheets (Taken 02/12/2019 0946) Patient will transfer sit to/from stand: with moderate assist Goal: Pt Will Transfer Bed To Chair/Chair To Bed Outcome: Progressing Flowsheets (Taken 02/12/2019 0946) Pt will Transfer Bed to Chair/Chair to Bed: with mod assist Goal: Pt Will Ambulate Outcome: Progressing Flowsheets (Taken 02/12/2019 0946) Pt will Ambulate:  25 feet  with moderate assist  with least restrictive assistive device  9:47 AM, 02/12/19 Wyman Songster PT, DPT Physical Therapist at [facility]

## 2019-02-12 NOTE — Evaluation (Signed)
Physical Therapy Evaluation Patient Details Name: [name] MRN: [id number] DOB: 08-Aug-1928 Today's Date: 02/12/2019   History of Present Illness  [name]  is a 86 y.o. female, with history of hypertension, dementia, osteoporosis was sent from the skilled facility for complaints of generalized weakness.  Patient is normally ambulatory and has not been able to ambulate today.  She reportedly had COVID-19 test 2 days ago, result is currently pending.  In the ED patient was found to be hypoxic with O2 sats 86% on room air.  Chest x-ray showed bilateral perihilar and bibasilar streaky densities concerning for developing infiltrate.  Patient started on ceftriaxone and Zithromax.    Clinical Impression  Patient appears to be a poor historian and limited history/PLOF is obtained secondary to impaired cognition and limited ability to answer questions. Patient limited for functional mobility as stated below secondary to BLE weakness, fatigue and poor sitting balance. Patient able to complete several ankle pumps while in bed but she does not comply with other requests made of her to move bilateral LE or use arms/trunk to assist with bed mobility. Patient requires max assist to transition to seated EOB and requires assist to remain seated upright. Patient wishes to return to supine and she is returned to lying in bed - RN notified. Patient will benefit from continued physical therapy in hospital and recommended venue below to increase strength, balance, endurance for safe ADLs and gait.    Follow Up Recommendations SNF    Equipment Recommendations  None recommended by PT    Recommendations for Other Services       Precautions / Restrictions Precautions Precautions: Fall Restrictions Weight Bearing Restrictions: No      Mobility  Bed Mobility Overal bed mobility: Needs Assistance Bed Mobility: Supine to Sit;Sit to Supine     Supine to sit: Max assist Sit to supine: Max assist    General bed mobility comments: to transition to seated EOB  Transfers                    Ambulation/Gait                Stairs            Wheelchair Mobility    Modified Rankin (Stroke Patients Only)       Balance Overall balance assessment: Needs assistance Sitting-balance support: Bilateral upper extremity supported;Feet supported Sitting balance-Leahy Scale: Poor Sitting balance - Comments: seated EOB                                     Pertinent Vitals/Pain Pain Assessment: No/denies pain    Home Living Family/patient expects to be discharged to:: Assisted living                 Additional Comments: Patient appears to be a poor historian and does not state AD used or home set up when asked    Prior Function Level of Independence: Needs assistance         Comments: Patient is a poor historian and history of PLOF is unable to be determined from patient     Hand Dominance        Extremity/Trunk Assessment   Upper Extremity Assessment Upper Extremity Assessment: Generalized weakness    Lower Extremity Assessment Lower Extremity Assessment: Generalized weakness       Communication      Cognition Arousal/Alertness: Awake/alert  Behavior During Therapy: Flat affect Overall Cognitive Status: No family/caregiver present to determine baseline cognitive functioning                                        General Comments      Exercises General Exercises - Lower Extremity Ankle Circles/Pumps: AROM;Both;5 reps;Supine   Assessment/Plan    PT Assessment Patient needs continued PT services  PT Problem List Decreased strength;Decreased range of motion;Decreased activity tolerance;Decreased balance;Decreased mobility;Decreased knowledge of use of DME       PT Treatment Interventions DME instruction;Gait training;Functional mobility training;Therapeutic activities;Therapeutic exercise;Balance  training;Neuromuscular re-education;Patient/family education;Manual techniques;Modalities    PT Goals (Current goals can be found in the Care Plan section)  Acute Rehab PT Goals Patient Stated Goal: return home PT Goal Formulation: With patient Time For Goal Achievement: 02/26/19 Potential to Achieve Goals: Fair    Frequency Min 3X/week   Barriers to discharge        Co-evaluation               AM-PAC PT "6 Clicks" Mobility  Outcome Measure Help needed turning from your back to your side while in a flat bed without using bedrails?: A Lot Help needed moving from lying on your back to sitting on the side of a flat bed without using bedrails?: A Lot Help needed moving to and from a bed to a chair (including a wheelchair)?: A Lot Help needed standing up from a chair using your arms (e.g., wheelchair or bedside chair)?: A Lot Help needed to walk in hospital room?: A Lot Help needed climbing 3-5 steps with a railing? : Total 6 Click Score: 11    End of Session Equipment Utilized During Treatment: Oxygen Activity Tolerance: Patient limited by fatigue Patient left: in bed;with call bell/phone within reach;with bed alarm set Nurse Communication: Mobility status PT Visit Diagnosis: Unsteadiness on feet (R26.81);Other abnormalities of gait and mobility (R26.89);Muscle weakness (generalized) (M62.81)    Time: 8938-1017 PT Time Calculation (min) (ACUTE ONLY): 16 min   Charges:   PT Evaluation $PT Eval Moderate Complexity: 1 Mod PT Treatments $Therapeutic Activity: 8-22 mins        9:45 AM, 02/12/19 Wyman Songster PT, DPT Physical Therapist at [facility]

## 2019-02-13 LAB — BASIC METABOLIC PANEL
Anion gap: 8 (ref 5–15)
BUN: 14 mg/dL (ref 8–23)
CO2: 25 mmol/L (ref 22–32)
Calcium: 8.3 mg/dL — ABNORMAL LOW (ref 8.9–10.3)
Chloride: 104 mmol/L (ref 98–111)
Creatinine, Ser: 0.7 mg/dL (ref 0.44–1.00)
GFR calc Af Amer: >60 mL/min (ref >60)
GFR calc non Af Amer: >60 mL/min (ref >60)
Glucose, Bld: 190 mg/dL — ABNORMAL HIGH (ref 70–99)
Potassium: 2.7 mmol/L — CL (ref 3.5–5.1)
Sodium: 137 mmol/L (ref 135–145)

## 2019-02-13 LAB — MAGNESIUM: Magnesium: 1.8 mg/dL (ref 1.7–2.4)

## 2019-02-13 MED ORDER — POTASSIUM CHLORIDE CRYS ER 20 MEQ PO TBCR
40.0000 meq | EXTENDED_RELEASE_TABLET | ORAL | Status: AC
  Administered 2019-02-13 (×2): 40 meq via ORAL
  Filled 2019-02-13 (×2): qty 2

## 2019-02-13 MED ORDER — GUAIFENESIN ER 600 MG PO TB12
600.0000 mg | ORAL_TABLET | Freq: Two times a day (BID) | ORAL | Status: DC
  Administered 2019-02-13 – 2019-02-14 (×2): 600 mg via ORAL
  Filled 2019-02-13 (×2): qty 1

## 2019-02-13 MED ORDER — POTASSIUM CHLORIDE CRYS ER 20 MEQ PO TBCR
40.0000 meq | EXTENDED_RELEASE_TABLET | Freq: Once | ORAL | Status: AC
  Administered 2019-02-13: 40 meq via ORAL
  Filled 2019-02-13: qty 2

## 2019-02-13 MED ORDER — MAGNESIUM OXIDE 400 (241.3 MG) MG PO TABS
400.0000 mg | ORAL_TABLET | Freq: Two times a day (BID) | ORAL | Status: DC
  Administered 2019-02-13 – 2019-02-14 (×2): 400 mg via ORAL
  Filled 2019-02-13 (×2): qty 1

## 2019-02-13 NOTE — Progress Notes (Signed)
PROGRESS NOTE    TATTIANA Gray  WIO:[id number] DOB: 11/22/1928 DOA: 02/11/2019 PCP: Carylon Perches, MD    Brief Narrative:   [name]  is a 86 y.o. female, with history of hypertension, dementia, osteoporosis was sent from the skilled facility for complaints of generalized weakness and found to have pneumonia   Per son at bedside, patient was able to move around with a walker prior to onset of pandemic. Since the pandemic, her room mate (who was more ambulatory, helping the patient around and gave her company) has been moved to a different room and her food is being brought to her in room (which was not the case before). Her family has not been able to visit her in person. All these changes, per son has led to decreased mobility and depression. He reports a decline in mentation and overall activity for the last several months.   Assessment & Plan:   Active Problems:   CAP (community acquired pneumonia) Non active medical problems:  Dementia HTN osteoporosis  Acute hypoxic respiratory failure Secondary to community acquired pneumonia --- COVID 19, influenza A/B negative.  -Chest x-ray findings with concerns about pneumonia on admission noted --- Continues to require oxygen, -Continue azithromycin/Rocephin along with mucolytics and bronchodilators -Attempt to wean off oxygen prior to transfer back to SNF facility  Dementia--baseline, underlying cognitive deficits Continue aricept   HTN--- stable, continue Amlodipine 5 mg daily and Losartan 100 mg daily, HCTZ held due to concerns about inadequate  oral intake and risk of dehydration - may use IV labetalol when necessary  Every 4 hours for systolic blood pressure over 160 mmhg  -Generalized weakness and deconditioning--- physical therapy evaluation appreciated recommend SNF rehab  Hypokalemia and hypomagnesemia--- persist despite discontinuation of HCTZ, repeat potassium down to 2.7, will replace and recheck  Hyperglycemia--glucose  is up to 190 this a.m, query if postprandial.  Check A1c  DVT prophylaxis: Lovenox SQ Code Status: DNR  Family Communication: None at bedside Disposition Plan: back to SNF pending medical improvement   Consultants:   None   Procedures:  None   Antimicrobials:   Ceftriaxone, azithromycin     Subjective: -Resting comfortably, does not really talk much -Denies chest pains oral intake fair -No fevers or emesis  Objective: Vitals:   02/12/19 1914 02/12/19 2054 02/13/19 0518 02/13/19 1300  BP:  (!) 149/73 (!) 153/63 (!) 144/83  Pulse:  88 82 79  Resp:  20 20 18   Temp:  98.2 F (36.8 C) 98.2 F (36.8 C) 98.5 F (36.9 C)  TempSrc:  Oral Oral Oral  SpO2: 94% 93% 94% 95%  Weight:      Height:        Intake/Output Summary (Last 24 hours) at 02/13/2019 1833 Last data filed at 02/13/2019 1700 Gross per 24 hour  Intake 930 ml  Output 550 ml  Net 380 ml   Filed Weights   02/11/19 0152 02/11/19 1900  Weight: 56.7 kg 56.7 kg    Examination:  Physical Exam Gen:- Awake Alert, elderly/frail appearing, in NAD HEENT:- [place].AT, No sclera icterus Nose- [place] 2 L/min Neck-Supple Neck,No JVD,.  Lungs-diminished in bases, no wheezing  CV- S1, S2 normal Abd-  +ve B.Sounds, Abd Soft, No tenderness,    Extremity/Skin:- No  edema, good pulses Psych-affect is flat,, query baseline underlying cognitive and memory deficits Neuro-generalized weakness, no new focal deficits, no tremors  Data Reviewed:   CBC: Recent Labs  Lab 02/11/19 0241 02/12/19 0704  WBC 4.2 5.7  NEUTROABS 3.1  --   HGB 12.9 12.1  HCT 39.8 38.1  MCV 96.4 96.0  PLT 211 865   Basic Metabolic Panel: Recent Labs  Lab 02/11/19 0241 02/12/19 0704 02/13/19 0959  NA 134* 138 137  K 3.1* 3.4* 2.7*  CL 98 102 104  CO2 26 26 25   GLUCOSE 112* 105* 190*  BUN 25* 16 14  CREATININE 0.83 0.72 0.70  CALCIUM 8.6* 8.5* 8.3*  MG  --   --  1.8   GFR: Estimated Creatinine Clearance: 40.2 mL/min (by C-G formula  based on SCr of 0.7 mg/dL). Liver Function Tests: Recent Labs  Lab 02/11/19 0241  AST 19  ALT 11  ALKPHOS 43  BILITOT 0.7  PROT 7.3  ALBUMIN 3.3*   No results for input(s): LIPASE, AMYLASE in the last 168 hours. No results for input(s): AMMONIA in the last 168 hours. Coagulation Profile: No results for input(s): INR, PROTIME in the last 168 hours. Cardiac Enzymes: No results for input(s): CKTOTAL, CKMB, CKMBINDEX, TROPONINI in the last 168 hours. BNP (last 3 results) No results for input(s): PROBNP in the last 8760 hours. HbA1C: No results for input(s): HGBA1C in the last 72 hours. CBG: No results for input(s): GLUCAP in the last 168 hours. Lipid Profile: No results for input(s): CHOL, HDL, LDLCALC, TRIG, CHOLHDL, LDLDIRECT in the last 72 hours. Thyroid Function Tests: No results for input(s): TSH, T4TOTAL, FREET4, T3FREE, THYROIDAB in the last 72 hours. Anemia Panel: No results for input(s): VITAMINB12, FOLATE, FERRITIN, TIBC, IRON, RETICCTPCT in the last 72 hours. Sepsis Labs: Recent Labs  Lab 02/12/19 1827  PROCALCITON <0.10    Recent Results (from the past 240 hour(s))  Culture, blood (routine x 2)     Status: None (Preliminary result)   Collection Time: 02/11/19  4:50 AM   Specimen: BLOOD  Result Value Ref Range Status   Specimen Description BLOOD RIGHT ANTECUBITAL  Final   Special Requests   Final    BOTTLES DRAWN AEROBIC ONLY Blood Culture adequate volume   Culture   Final    NO GROWTH 2 DAYS Performed at [facility], [address] [place], [place] [address]    Report Status PENDING  Incomplete  Culture, blood (routine x 2)     Status: None (Preliminary result)   Collection Time: 02/11/19  5:34 AM   Specimen: BLOOD  Result Value Ref Range Status   Specimen Description BLOOD BLOOD RIGHT HAND  Final   Special Requests   Final    BOTTLES DRAWN AEROBIC AND ANAEROBIC Blood Culture adequate volume   Culture   Final    NO GROWTH 2 DAYS Performed at [facility], [address] [place], [place] [address]    Report Status PENDING  Incomplete  Respiratory Panel by RT PCR (Flu A&B, Covid) - Nasopharyngeal Swab     Status: None   Collection Time: 02/11/19  5:41 AM   Specimen: Nasopharyngeal Swab  Result Value Ref Range Status   SARS Coronavirus 2 by RT PCR NEGATIVE NEGATIVE Final    Comment: (NOTE) SARS-CoV-2 target nucleic acids are NOT DETECTED. The SARS-CoV-2 RNA is generally detectable in upper respiratoy specimens during the acute phase of infection. The lowest concentration of SARS-CoV-2 viral copies this assay can detect is 131 copies/mL. A negative result does not preclude SARS-Cov-2 infection and should not be used as the sole basis for treatment or other patient management decisions. A negative result may occur with  improper specimen collection/handling, submission of  specimen other than nasopharyngeal swab, presence of viral mutation(s) within the areas targeted by this assay, and inadequate number of viral copies (<131 copies/mL). A negative result must be combined with clinical observations, patient history, and epidemiological information. The expected result is Negative. Fact Sheet for Patients:  https://www.moore.com/ Fact Sheet for Healthcare Providers:  https://www.young.biz/ This test is not yet ap proved or cleared by the [place] FDA and  has been authorized for detection and/or diagnosis of SARS-CoV-2 by FDA under an Emergency Use Authorization (EUA). This EUA will remain  in effect (meaning this test can be used) for the duration of the COVID-19 declaration under Section 564(b)(1) of the Act, 21 U.S.C. section 360bbb-3(b)(1), unless the authorization is terminated or revoked sooner.    Influenza A by PCR NEGATIVE NEGATIVE Final   Influenza B by PCR NEGATIVE NEGATIVE Final    Comment: (NOTE) The Xpert Xpress SARS-CoV-2/FLU/RSV assay is intended as an aid in  the  diagnosis of influenza from Nasopharyngeal swab specimens and  should not be used as a sole basis for treatment. Nasal washings and  aspirates are unacceptable for Xpert Xpress SARS-CoV-2/FLU/RSV  testing. Fact Sheet for Patients: https://www.moore.com/ Fact Sheet for Healthcare Providers: https://www.young.biz/ This test is not yet approved or cleared by the [place] FDA and  has been authorized for detection and/or diagnosis of SARS-CoV-2 by  FDA under an Emergency Use Authorization (EUA). This EUA will remain  in effect (meaning this test can be used) for the duration of the  Covid-19 declaration under Section 564(b)(1) of the Act, 21  U.S.C. section 360bbb-3(b)(1), unless the authorization is  terminated or revoked. Performed at [facility], [address] [place], [place] [address]   MRSA PCR Screening     Status: None   Collection Time: 02/11/19  9:21 PM   Specimen: Nasal Mucosa; Nasopharyngeal  Result Value Ref Range Status   MRSA by PCR NEGATIVE NEGATIVE Final    Comment:        The GeneXpert MRSA Assay (FDA approved for NASAL specimens only), is one component of a comprehensive MRSA colonization surveillance program. It is not intended to diagnose MRSA infection nor to guide or monitor treatment for MRSA infections. Performed at [facility], [address] [place], [place] [address]      Radiology Studies: No results found. Scheduled Meds: . amLODipine  5 mg Oral QHS  . donepezil  5 mg Oral QHS  . enoxaparin (LOVENOX) injection  40 mg Subcutaneous Q24H  . ipratropium-albuterol  3 mL Nebulization BID  . losartan  100 mg Oral Daily  . pantoprazole  40 mg Oral Daily   Continuous Infusions: . azithromycin 500 mg (02/13/19 1245)  . cefTRIAXone (ROCEPHIN)  IV 1 g (02/13/19 0423)     LOS: 2 days   Shon Hale, MD [facility] If 7PM-7AM, please contact night-coverage 02/13/2019, 6:33 PM

## 2019-02-14 ENCOUNTER — Encounter (HOSPITAL_COMMUNITY): Payer: Self-pay | Admitting: Family Medicine

## 2019-02-14 DIAGNOSIS — Z515 Encounter for palliative care: Secondary | ICD-10-CM

## 2019-02-14 DIAGNOSIS — Z66 Do not resuscitate: Secondary | ICD-10-CM | POA: Diagnosis not present

## 2019-02-14 DIAGNOSIS — E876 Hypokalemia: Secondary | ICD-10-CM | POA: Diagnosis present

## 2019-02-14 DIAGNOSIS — Z7189 Other specified counseling: Secondary | ICD-10-CM

## 2019-02-14 DIAGNOSIS — R0902 Hypoxemia: Secondary | ICD-10-CM

## 2019-02-14 DIAGNOSIS — E860 Dehydration: Secondary | ICD-10-CM | POA: Diagnosis present

## 2019-02-14 DIAGNOSIS — R269 Unspecified abnormalities of gait and mobility: Secondary | ICD-10-CM

## 2019-02-14 DIAGNOSIS — R531 Weakness: Secondary | ICD-10-CM

## 2019-02-14 DIAGNOSIS — G9341 Metabolic encephalopathy: Secondary | ICD-10-CM | POA: Diagnosis present

## 2019-02-14 LAB — BASIC METABOLIC PANEL
Anion gap: 12 (ref 5–15)
BUN: 16 mg/dL (ref 8–23)
CO2: 24 mmol/L (ref 22–32)
Calcium: 8.6 mg/dL — ABNORMAL LOW (ref 8.9–10.3)
Chloride: 104 mmol/L (ref 98–111)
Creatinine, Ser: 0.56 mg/dL (ref 0.44–1.00)
GFR calc Af Amer: >60 mL/min (ref >60)
GFR calc non Af Amer: >60 mL/min (ref >60)
Glucose, Bld: 129 mg/dL — ABNORMAL HIGH (ref 70–99)
Potassium: 3.8 mmol/L (ref 3.5–5.1)
Sodium: 140 mmol/L (ref 135–145)

## 2019-02-14 LAB — HEMOGLOBIN A1C
Hgb A1c MFr Bld: 6.3 % — ABNORMAL HIGH (ref 4.8–5.6)
Mean Plasma Glucose: 134.11 mg/dL

## 2019-02-14 LAB — GLUCOSE, CAPILLARY: Glucose-Capillary: 182 mg/dL — ABNORMAL HIGH (ref 70–99)

## 2019-02-14 MED ORDER — GLYCOPYRROLATE 0.2 MG/ML IJ SOLN: 0.2000 mg | INTRAMUSCULAR | Status: DC | PRN

## 2019-02-14 MED ORDER — DIPHENHYDRAMINE HCL 50 MG/ML IJ SOLN: 12.5000 mg | INTRAMUSCULAR | Status: DC | PRN

## 2019-02-14 MED ORDER — MORPHINE SULFATE (CONCENTRATE) 10 MG/0.5ML PO SOLN: 5.0000 mg | ORAL | Status: DC | PRN

## 2019-02-14 MED ORDER — BISACODYL 10 MG RE SUPP: 10.0000 mg | Freq: Every day | RECTAL | Status: DC | PRN

## 2019-02-14 MED ORDER — LORAZEPAM 1 MG PO TABS: 1.0000 mg | ORAL_TABLET | ORAL | Status: DC | PRN

## 2019-02-14 MED ORDER — LORAZEPAM 2 MG/ML IJ SOLN: 1.0000 mg | INTRAMUSCULAR | Status: DC | PRN

## 2019-02-14 MED ORDER — ONDANSETRON 4 MG PO TBDP: 4.0000 mg | ORAL_TABLET | Freq: Four times a day (QID) | ORAL | Status: DC | PRN

## 2019-02-14 MED ORDER — GLYCOPYRROLATE 1 MG PO TABS: 1.0000 mg | ORAL_TABLET | ORAL | Status: DC | PRN

## 2019-02-14 MED ORDER — SODIUM CHLORIDE 0.9 % IV SOLN
500.0000 mg | INTRAVENOUS | Status: DC
  Administered 2019-02-15: 500 mg via INTRAVENOUS
  Filled 2019-02-14: qty 500

## 2019-02-14 MED ORDER — GUAIFENESIN ER 600 MG PO TB12
600.0000 mg | ORAL_TABLET | Freq: Two times a day (BID) | ORAL | Status: DC
  Administered 2019-02-14 – 2019-02-19 (×8): 600 mg via ORAL
  Filled 2019-02-14 (×9): qty 1

## 2019-02-14 MED ORDER — LOSARTAN POTASSIUM 50 MG PO TABS
100.0000 mg | ORAL_TABLET | Freq: Every day | ORAL | Status: DC
  Administered 2019-02-15 – 2019-02-19 (×3): 100 mg via ORAL
  Filled 2019-02-14 (×5): qty 2

## 2019-02-14 MED ORDER — LORAZEPAM 2 MG/ML PO CONC: 1.0000 mg | ORAL | Status: DC | PRN

## 2019-02-14 MED ORDER — SODIUM CHLORIDE 0.9 % IV SOLN
1.0000 g | INTRAVENOUS | Status: DC
  Administered 2019-02-15 – 2019-02-18 (×4): 1 g via INTRAVENOUS
  Filled 2019-02-14 (×4): qty 10

## 2019-02-14 MED ORDER — ONDANSETRON HCL 4 MG/2ML IJ SOLN: 4.0000 mg | Freq: Four times a day (QID) | INTRAMUSCULAR | Status: DC | PRN

## 2019-02-14 MED ORDER — POLYVINYL ALCOHOL 1.4 % OP SOLN: 1.0000 [drp] | Freq: Four times a day (QID) | OPHTHALMIC | Status: DC | PRN

## 2019-02-14 MED ORDER — PANTOPRAZOLE SODIUM 40 MG PO TBEC
40.0000 mg | DELAYED_RELEASE_TABLET | Freq: Every day | ORAL | Status: DC
  Administered 2019-02-15 – 2019-02-19 (×5): 40 mg via ORAL
  Filled 2019-02-14 (×5): qty 1

## 2019-02-14 NOTE — TOC Initial Note (Addendum)
Transition of Care [facility]) - Initial/Assessment Note    Patient Details  Name: [name] MRN: [id number] Date of Birth: 06-23-1928  [facility] CM/SW Contact:    Leitha Bleak, RN Phone Number: 02/14/2019, 1:32 PM  Clinical Narrative:  Patient is from [facility]. MD asking CM to lift patients visitors restrictions so family can visit today. Family is wanting to make patient comfort care.  CM called AC, they will allow 2 visitors, patient is not currently at end of life. CM waiting on Palliative consult for discharge planning.            Addendum:  Patient has 5 children. Algernon Huxley is visiting, per nurse crying unsure of what is going on.  CM called [name] is is on her way to visit and have a discussion with Algernon Huxley.  Per Sammuel Bailiff says he is POA,  They have not seen him in a year and she does not think he has legal papers to make him POA.  TOC to follow.       Expected Discharge Plan: Home w Hospice Care Barriers to Discharge: Family Issues, Continued Medical Work up, Other (comment)(need for Palliative consutl)   Patient Goals and CMS Choice     Expected Discharge Plan and Services Expected Discharge Plan: Home w Hospice Care       Prior Living Arrangements/Services   Lives with:: Facility Resident      Activities of Daily Living Home Assistive Devices/Equipment: Wheelchair, CBG Meter ADL Screening (condition at time of admission) Patient's cognitive ability adequate to safely complete daily activities?: No Is the patient deaf or have difficulty hearing?: No Does the patient have difficulty seeing, even when wearing glasses/contacts?: No Does the patient have difficulty concentrating, remembering, or making decisions?: Yes Patient able to express need for assistance with ADLs?: No Does the patient have difficulty dressing or bathing?: Yes Independently performs ADLs?: No Communication: Independent Dressing (OT): Needs assistance Is this a change from  baseline?: Pre-admission baseline Grooming: Needs assistance Is this a change from baseline?: Pre-admission baseline Feeding: Independent Bathing: Needs assistance Is this a change from baseline?: Pre-admission baseline Toileting: Needs assistance Is this a change from baseline?: Pre-admission baseline In/Out Bed: Needs assistance Is this a change from baseline?: Pre-admission baseline Walks in Home: Independent with device (comment) Does the patient have difficulty walking or climbing stairs?: No Weakness of Legs: None Weakness of Arms/Hands: None  Permission Sought/Granted       Admission diagnosis:  Hypokalemia [E87.6] Weakness [R53.1] Hypoxia [R09.02] CAP (community acquired pneumonia) [J18.9] Community acquired pneumonia, unspecified laterality [J18.9] Patient Active Problem List   Diagnosis Date Noted  . Dementia (HCC) 02/12/2019  . CAP (community acquired pneumonia) 02/11/2019  . Chest pain 05/14/2016  . Goiter 05/14/2016  . Intertrochanteric fracture of right hip (HCC) 11/19/2012  . UTI (urinary tract infection) 11/19/2012  . Dizziness and giddiness 06/14/2012  . HTN (hypertension) 06/14/2012  . Difficulty in walking(719.7) 09/09/2011  . Osteoarthritis of leg 09/09/2011  . Abnormality of gait 09/09/2011  . Pain in joint, lower leg 09/09/2011   PCP:  Carylon Perches, MD Pharmacy:   [facility], [place] - [place], [place] - [address] [phone] [address] [place] [address] Phone: [phone] Fax: [phone]  [facility] [phone] [place], [place] - [address] [place] [address] Phone: [phone] Fax: [phone]  [facility] - [place], [place] - [phone] [employer]  [place] [address] Phone: [phone] Fax: [phone]

## 2019-02-14 NOTE — Progress Notes (Addendum)
PROGRESS NOTE [facility]   [name]  XFG:[id number]  DOB: [birth year]  DOA: 02/11/2019 PCP: Asencion Noble, MD  Brief Admission Hx: 86 year old female with advanced dementia, hypertension, gait instability, chronic debility, hypokalemia, osteoporosis and hypertension who presented with hypoxic respiratory failure from pneumonia, severe dehydration and altered mentation with severe somnolence.  MDM/Assessment & Plan:   1. Acute hypoxic respiratory failure-secondary to pneumonia-treated with IV antibiotics and supportive care, COVID-19 and influenza tests negative. 2. Advanced dementia-patient has been maintained on Aricept.  Family reporting precipitous decline over the past several years. 3. Essential hypertension-Home blood pressure medications had been held due to poor oral intake and severe dehydration and hypotension. 4. Goals of care-I had a long discussion with the patient's daughter and she conference with family members and the decision was made to transition patient to full comfort care for dignity comfort.  Arrangements have been made for visitation from family members and full comfort care orders initiated.  We will ask for palliative medicine consult to assist with symptom management and consult TOC representative to work with the family regarding disposition home with hospice versus returning to ALF with hospice services. 5. DNR do not resuscitate  **Update:  I spoke with daughter and son at bedside, and they expressed that they want to continue current treatments until they can meet with palliative medicine team tomorrow.  They want to have a family meeting with palliative so that all the siblings can be there and discuss transition to full comfort,  Continue IV fluids, antibiotics now.  Code Status: DNR Family Communication: Spoke with her daughter by telephone Disposition Plan: as above continue IV fluids, antibiotics  Subjective: Patient is too somnolent to answer  questions.  Objective: Vitals:   02/13/19 2202 02/14/19 0357 02/14/19 0742 02/14/19 1359  BP: (!) 144/74 (!) 142/69  127/71  Pulse: 86 85  91  Resp: 20 18  18   Temp: 98.6 F (37 C) 99.3 F (37.4 C)  98.4 F (36.9 C)  TempSrc: Oral   Oral  SpO2: 93% 95% (!) 1% 92%  Weight:      Height:        Intake/Output Summary (Last 24 hours) at 02/14/2019 1523 Last data filed at 02/14/2019 9678 Gross per 24 hour  Intake 590 ml  Output 650 ml  Net -60 ml   Filed Weights   02/11/19 0152 02/11/19 1900  Weight: 56.7 kg 56.7 kg     REVIEW OF SYSTEMS  Patient is too somnolent to answer questions  Exam:  General exam: Elderly chronically ill-appearing female lying in bed she is somnolent but arousable and in no apparent distress. Respiratory system: Rales right lower lobe, no increased work of breathing. Cardiovascular system: normal S1 & S2 heard.  Gastrointestinal system: Abdomen is nondistended, soft and nontender. Normal bowel sounds heard. Central nervous system: Somnolent with advanced dementia no focal neurological deficits. Extremities: no cyanosis seen.  Data Reviewed: Basic Metabolic Panel: Recent Labs  Lab 02/11/19 0241 02/12/19 0704 02/13/19 0959 02/14/19 0644  NA 134* 138 137 140  K 3.1* 3.4* 2.7* 3.8  CL 98 102 104 104  CO2 26 26 25 24   GLUCOSE 112* 105* 190* 129*  BUN 25* 16 14 16   CREATININE 0.83 0.72 0.70 0.56  CALCIUM 8.6* 8.5* 8.3* 8.6*  MG  --   --  1.8  --    Liver Function Tests: Recent Labs  Lab 02/11/19 0241  AST 19  ALT 11  ALKPHOS 43  BILITOT  0.7  PROT 7.3  ALBUMIN 3.3*   No results for input(s): LIPASE, AMYLASE in the last 168 hours. No results for input(s): AMMONIA in the last 168 hours. CBC: Recent Labs  Lab 02/11/19 0241 02/12/19 0704  WBC 4.2 5.7  NEUTROABS 3.1  --   HGB 12.9 12.1  HCT 39.8 38.1  MCV 96.4 96.0  PLT 211 181   Cardiac Enzymes: No results for input(s): CKTOTAL, CKMB, CKMBINDEX, TROPONINI in the last 168  hours. CBG (last 3)  No results for input(s): GLUCAP in the last 72 hours. Recent Results (from the past 240 hour(s))  Culture, blood (routine x 2)     Status: None (Preliminary result)   Collection Time: 02/11/19  4:50 AM   Specimen: BLOOD  Result Value Ref Range Status   Specimen Description BLOOD RIGHT ANTECUBITAL  Final   Special Requests   Final    BOTTLES DRAWN AEROBIC ONLY Blood Culture adequate volume   Culture   Final    NO GROWTH 2 DAYS Performed at [facility], [address] [place], [place] [address]    Report Status PENDING  Incomplete  Culture, blood (routine x 2)     Status: None (Preliminary result)   Collection Time: 02/11/19  5:34 AM   Specimen: BLOOD  Result Value Ref Range Status   Specimen Description BLOOD BLOOD RIGHT HAND  Final   Special Requests   Final    BOTTLES DRAWN AEROBIC AND ANAEROBIC Blood Culture adequate volume   Culture   Final    NO GROWTH 2 DAYS Performed at [facility], [address] [place], [place] [address]    Report Status PENDING  Incomplete  Respiratory Panel by RT PCR (Flu A&B, Covid) - Nasopharyngeal Swab     Status: None   Collection Time: 02/11/19  5:41 AM   Specimen: Nasopharyngeal Swab  Result Value Ref Range Status   SARS Coronavirus 2 by RT PCR NEGATIVE NEGATIVE Final    Comment: (NOTE) SARS-CoV-2 target nucleic acids are NOT DETECTED. The SARS-CoV-2 RNA is generally detectable in upper respiratoy specimens during the acute phase of infection. The lowest concentration of SARS-CoV-2 viral copies this assay can detect is 131 copies/mL. A negative result does not preclude SARS-Cov-2 infection and should not be used as the sole basis for treatment or other patient management decisions. A negative result may occur with  improper specimen collection/handling, submission of specimen other than nasopharyngeal swab, presence of viral mutation(s) within the areas targeted by this assay, and inadequate number of viral copies  (<131 copies/mL). A negative result must be combined with clinical observations, patient history, and epidemiological information. The expected result is Negative. Fact Sheet for Patients:  https://www.moore.com/ Fact Sheet for Healthcare Providers:  https://www.young.biz/ This test is not yet ap proved or cleared by the [place] FDA and  has been authorized for detection and/or diagnosis of SARS-CoV-2 by FDA under an Emergency Use Authorization (EUA). This EUA will remain  in effect (meaning this test can be used) for the duration of the COVID-19 declaration under Section 564(b)(1) of the Act, 21 U.S.C. section 360bbb-3(b)(1), unless the authorization is terminated or revoked sooner.    Influenza A by PCR NEGATIVE NEGATIVE Final   Influenza B by PCR NEGATIVE NEGATIVE Final    Comment: (NOTE) The Xpert Xpress SARS-CoV-2/FLU/RSV assay is intended as an aid in  the diagnosis of influenza from Nasopharyngeal swab specimens and  should not be used as a sole basis for treatment. Nasal washings  and  aspirates are unacceptable for Xpert Xpress SARS-CoV-2/FLU/RSV  testing. Fact Sheet for Patients: https://www.moore.com/ Fact Sheet for Healthcare Providers: https://www.young.biz/ This test is not yet approved or cleared by the [place] FDA and  has been authorized for detection and/or diagnosis of SARS-CoV-2 by  FDA under an Emergency Use Authorization (EUA). This EUA will remain  in effect (meaning this test can be used) for the duration of the  Covid-19 declaration under Section 564(b)(1) of the Act, 21  U.S.C. section 360bbb-3(b)(1), unless the authorization is  terminated or revoked. Performed at [facility], [address], [place], [place] [address]   MRSA PCR Screening     Status: None   Collection Time: 02/11/19  9:21 PM   Specimen: Nasal Mucosa; Nasopharyngeal  Result Value Ref Range Status    MRSA by PCR NEGATIVE NEGATIVE Final    Comment:        The GeneXpert MRSA Assay (FDA approved for NASAL specimens only), is one component of a comprehensive MRSA colonization surveillance program. It is not intended to diagnose MRSA infection nor to guide or monitor treatment for MRSA infections. Performed at [facility], [address] [place], [place] [address]      Studies: No results found.   Scheduled Meds: . ipratropium-albuterol  3 mL Nebulization BID   Continuous Infusions:  Active Problems:   Abnormality of gait   CAP (community acquired pneumonia)   Dementia (HCC)   Hypokalemia   Weakness   Hypoxia   Dehydration   Metabolic encephalopathy   DNR (do not resuscitate)   Goals of care, counseling/discussion   Comfort measures only status   Time spent:   Standley Dakins, MD [facility] 02/14/2019, 3:23 PM    LOS: 3 days  How to contact the [facility] Attending or Consulting provider 7A - 7P or covering provider during after hours 7P -7A, for this patient?  1. Check the care team in [facility] and look for a) attending/consulting TRH provider listed and b) the [facility] team listed 2. Log into [email or web address] and use [place]'s universal password to access. If you do not have the password, please contact the hospital operator. 3. Locate the [facility] provider you are looking for under [facility] and page to a number that you can be directly reached. 4. If you still have difficulty reaching the provider, please page the [facility] (Director on Call) for the Hospitalists listed on amion for assistance.

## 2019-02-14 NOTE — Plan of Care (Signed)
02/14/2019 1:33 PM   I had a long discussion with patient's daughter [name] and she shared her concerns about patient's progressive decline over last 4 years and poor quality of life.  She reports concerns about patient is afraid and alone in the hospital and shared that patient had told her privately that she would not want to be in this condition and would want to focus more on comfort and dignity.  She shared that patient is very religious and takes great comfort in her faith.  She requested that we transition to full comfort care and she would like to visit patient.  Will ask palliative care team to consult tomorrow. They are not available on campus today.  Will start full comfort care and I have notifed nurse and AC to liberalize visitation for comfort care.  TOC to speak with family about disposition, residential hospice versus home with hospice, etc.   Maryln Manuel MD

## 2019-02-15 NOTE — Care Management Important Message (Signed)
Important Message  Patient Details  Name: [name] MRN: [id number] Date of Birth: 10/30/1928   Medicare Important Message Given:  Yes(Diana, RN will deliver letter to patient)     Corey Harold 02/15/2019, 2:48 PM

## 2019-02-15 NOTE — Progress Notes (Signed)
Palliative:  I will meet with children [name] tomorrow 02/15/18 1000 am for goals of care discussion as discussed with [name]. Thank you for this consult.   Yong Channel, NP Palliative Medicine Team Pager [phone] (Please see [email or web address] for schedule) Team Phone [phone]

## 2019-02-15 NOTE — Progress Notes (Signed)
PROGRESS NOTE [facility]   [name]  RKY:[id number]  DOB: 1928-03-05  DOA: 02/11/2019 PCP: Carylon Perches, MD  Brief Admission Hx: 86 year old female with advanced dementia, hypertension, gait instability, chronic debility, hypokalemia, osteoporosis and hypertension who presented with hypoxic respiratory failure from pneumonia, severe dehydration and altered mentation with severe somnolence.  MDM/Assessment & Plan:   1. Acute hypoxic respiratory failure-secondary to pneumonia-treated with IV antibiotics and supportive care, COVID-19 and influenza tests negative. 2. Advanced dementia-patient has been maintained on Aricept.  Family reporting precipitous decline over the past several years. 3. Essential hypertension-resumed home losartan. Following.  Good control at this time.  4. Goals of care-I had a discussion with the patient's son and daughter at bedside and because patient has large family they would prefer that all siblings assemble for a conference with palliative medicine team for goals of care. I spoke [place] with palliative care and she will make arrangements to meet with them.   5. DNR do not resuscitate  Code Status: DNR Family Communication: family wants to meet with palliative medicine to discuss goals of care Disposition Plan: waiting on goals of care discussions, continue current management  Subjective: Patient a bit more alert today.  She is not eating as well.    Objective: Vitals:   02/15/19 0455 02/15/19 0737 02/15/19 1000 02/15/19 1203  BP:   (!) 147/78 132/88  Pulse:   90 72  Resp:   18 16  Temp:   98.7 F (37.1 C) 99.2 F (37.3 C)  TempSrc:   Oral Axillary  SpO2: 90% 90% 91% 92%  Weight:      Height:        Intake/Output Summary (Last 24 hours) at 02/15/2019 1505 Last data filed at 02/15/2019 0600 Gross per 24 hour  Intake 470.04 ml  Output --  Net 470.04 ml   Filed Weights   02/11/19 0152 02/11/19 1900  Weight: 56.7 kg 56.7 kg      REVIEW OF SYSTEMS  Patient is too somnolent to answer questions  Exam:  General exam: Elderly chronically ill-appearing female lying in bed she is somnolent but arousable and in no apparent distress. Respiratory system: shallow breathing, rales right lower lobe, no increased work of breathing. Cardiovascular system: normal S1 & S2 heard.  Gastrointestinal system: Abdomen is nondistended, soft and nontender. Normal bowel sounds heard. Central nervous system: Somnolent with advanced dementia no focal neurological deficits. Extremities: no cyanosis seen.  Data Reviewed: Basic Metabolic Panel: Recent Labs  Lab 02/11/19 0241 02/12/19 0704 02/13/19 0959 02/14/19 0644  NA 134* 138 137 140  K 3.1* 3.4* 2.7* 3.8  CL 98 102 104 104  CO2 26 26 25 24   GLUCOSE 112* 105* 190* 129*  BUN 25* 16 14 16   CREATININE 0.83 0.72 0.70 0.56  CALCIUM 8.6* 8.5* 8.3* 8.6*  MG  --   --  1.8  --    Liver Function Tests: Recent Labs  Lab 02/11/19 0241  AST 19  ALT 11  ALKPHOS 43  BILITOT 0.7  PROT 7.3  ALBUMIN 3.3*   No results for input(s): LIPASE, AMYLASE in the last 168 hours. No results for input(s): AMMONIA in the last 168 hours. CBC: Recent Labs  Lab 02/11/19 0241 02/12/19 0704  WBC 4.2 5.7  NEUTROABS 3.1  --   HGB 12.9 12.1  HCT 39.8 38.1  MCV 96.4 96.0  PLT 211 181   Cardiac Enzymes: No results for input(s): CKTOTAL, CKMB, CKMBINDEX, TROPONINI in the last  168 hours. CBG (last 3)  Recent Labs    02/14/19 2107  GLUCAP 182*   Recent Results (from the past 240 hour(s))  Culture, blood (routine x 2)     Status: None (Preliminary result)   Collection Time: 02/11/19  4:50 AM   Specimen: BLOOD  Result Value Ref Range Status   Specimen Description BLOOD RIGHT ANTECUBITAL  Final   Special Requests   Final    BOTTLES DRAWN AEROBIC ONLY Blood Culture adequate volume   Culture   Final    NO GROWTH 4 DAYS Performed at [facility], [address], [place], [place]  [address]    Report Status PENDING  Incomplete  Culture, blood (routine x 2)     Status: None (Preliminary result)   Collection Time: 02/11/19  5:34 AM   Specimen: BLOOD  Result Value Ref Range Status   Specimen Description BLOOD BLOOD RIGHT HAND  Final   Special Requests   Final    BOTTLES DRAWN AEROBIC AND ANAEROBIC Blood Culture adequate volume   Culture   Final    NO GROWTH 4 DAYS Performed at [facility], [address], [place], [place] [address]    Report Status PENDING  Incomplete  Respiratory Panel by RT PCR (Flu A&B, Covid) - Nasopharyngeal Swab     Status: None   Collection Time: 02/11/19  5:41 AM   Specimen: Nasopharyngeal Swab  Result Value Ref Range Status   SARS Coronavirus 2 by RT PCR NEGATIVE NEGATIVE Final    Comment: (NOTE) SARS-CoV-2 target nucleic acids are NOT DETECTED. The SARS-CoV-2 RNA is generally detectable in upper respiratoy specimens during the acute phase of infection. The lowest concentration of SARS-CoV-2 viral copies this assay can detect is 131 copies/mL. A negative result does not preclude SARS-Cov-2 infection and should not be used as the sole basis for treatment or other patient management decisions. A negative result may occur with  improper specimen collection/handling, submission of specimen other than nasopharyngeal swab, presence of viral mutation(s) within the areas targeted by this assay, and inadequate number of viral copies (<131 copies/mL). A negative result must be combined with clinical observations, patient history, and epidemiological information. The expected result is Negative. Fact Sheet for Patients:  PinkCheek.be Fact Sheet for Healthcare Providers:  GravelBags.it This test is not yet ap proved or cleared by the [place] FDA and  has been authorized for detection and/or diagnosis of SARS-CoV-2 by FDA under an Emergency Use Authorization (EUA). This EUA will remain   in effect (meaning this test can be used) for the duration of the COVID-19 declaration under Section 564(b)(1) of the Act, 21 U.S.C. section 360bbb-3(b)(1), unless the authorization is terminated or revoked sooner.    Influenza A by PCR NEGATIVE NEGATIVE Final   Influenza B by PCR NEGATIVE NEGATIVE Final    Comment: (NOTE) The Xpert Xpress SARS-CoV-2/FLU/RSV assay is intended as an aid in  the diagnosis of influenza from Nasopharyngeal swab specimens and  should not be used as a sole basis for treatment. Nasal washings and  aspirates are unacceptable for Xpert Xpress SARS-CoV-2/FLU/RSV  testing. Fact Sheet for Patients: PinkCheek.be Fact Sheet for Healthcare Providers: GravelBags.it This test is not yet approved or cleared by the [place] FDA and  has been authorized for detection and/or diagnosis of SARS-CoV-2 by  FDA under an Emergency Use Authorization (EUA). This EUA will remain  in effect (meaning this test can be used) for the duration of the  Covid-19 declaration under Section  564(b)(1) of the Act, 21  U.S.C. section 360bbb-3(b)(1), unless the authorization is  terminated or revoked. Performed at [facility], [address], [place], [place] [address]   MRSA PCR Screening     Status: None   Collection Time: 02/11/19  9:21 PM   Specimen: Nasal Mucosa; Nasopharyngeal  Result Value Ref Range Status   MRSA by PCR NEGATIVE NEGATIVE Final    Comment:        The GeneXpert MRSA Assay (FDA approved for NASAL specimens only), is one component of a comprehensive MRSA colonization surveillance program. It is not intended to diagnose MRSA infection nor to guide or monitor treatment for MRSA infections. Performed at [facility], [address], [place], [place] [address]      Studies: No results found.   Scheduled Meds: . guaiFENesin  600 mg Oral BID  . ipratropium-albuterol  3 mL Nebulization BID  . losartan   100 mg Oral Daily  . pantoprazole  40 mg Oral Q0600   Continuous Infusions: . cefTRIAXone (ROCEPHIN)  IV 1 g (02/15/19 0413)    Active Problems:   Abnormality of gait   CAP (community acquired pneumonia)   Dementia (HCC)   Hypokalemia   Weakness   Hypoxia   Dehydration   Metabolic encephalopathy   DNR (do not resuscitate)   Goals of care, counseling/discussion   Comfort measures only status  Time spent:   Standley Dakins, MD [facility] 02/15/2019, 3:05 PM    LOS: 4 days  How to contact the [facility] Attending or Consulting provider 7A - 7P or covering provider during after hours 7P -7A, for this patient?  1. Check the care team in [facility] and look for a) attending/consulting TRH provider listed and b) the [facility] team listed 2. Log into [email or web address] and use [facility]'s universal password to access. If you do not have the password, please contact the hospital operator. 3. Locate the [facility] provider you are looking for under [facility] and page to a number that you can be directly reached. 4. If you still have difficulty reaching the provider, please page the [facility] (Director on Call) for the Hospitalists listed on amion for assistance.

## 2019-02-16 DIAGNOSIS — F0390 Unspecified dementia without behavioral disturbance: Secondary | ICD-10-CM

## 2019-02-16 DIAGNOSIS — Z7189 Other specified counseling: Secondary | ICD-10-CM

## 2019-02-16 DIAGNOSIS — R627 Adult failure to thrive: Secondary | ICD-10-CM

## 2019-02-16 DIAGNOSIS — Z515 Encounter for palliative care: Secondary | ICD-10-CM

## 2019-02-16 LAB — BASIC METABOLIC PANEL
Anion gap: 10 (ref 5–15)
BUN: 16 mg/dL (ref 8–23)
CO2: 26 mmol/L (ref 22–32)
Calcium: 8.5 mg/dL — ABNORMAL LOW (ref 8.9–10.3)
Chloride: 102 mmol/L (ref 98–111)
Creatinine, Ser: 0.57 mg/dL (ref 0.44–1.00)
GFR calc Af Amer: >60 mL/min (ref >60)
GFR calc non Af Amer: >60 mL/min (ref >60)
Glucose, Bld: 116 mg/dL — ABNORMAL HIGH (ref 70–99)
Potassium: 3.4 mmol/L — ABNORMAL LOW (ref 3.5–5.1)
Sodium: 138 mmol/L (ref 135–145)

## 2019-02-16 LAB — GLUCOSE, CAPILLARY
Glucose-Capillary: 101 mg/dL — ABNORMAL HIGH (ref 70–99)
Glucose-Capillary: 125 mg/dL — ABNORMAL HIGH (ref 70–99)
Glucose-Capillary: 113 mg/dL — ABNORMAL HIGH (ref 70–99)
Glucose-Capillary: 129 mg/dL — ABNORMAL HIGH (ref 70–99)

## 2019-02-16 MED ORDER — POTASSIUM CHLORIDE 20 MEQ/15ML (10%) PO SOLN
40.0000 meq | Freq: Every day | ORAL | Status: AC
  Administered 2019-02-16: 40 meq via ORAL
  Filled 2019-02-16: qty 30

## 2019-02-16 NOTE — TOC Progression Note (Signed)
Transition of Care [facility]) - Progression Note    Patient Details  Name: CALISTA CRAIN MRN: [id number] Date of Birth: Jan 31, 1929  Transition of Care [facility] CM/SW Contact  Leitha Bleak, RN Phone Number: 02/16/2019, 1:16 PM  Clinical Narrative:   Palliative meet with family.  Question ask if [facility] would accept patient back with Hospice.  CM called Tammy at [facility]. Patient is to low to accept back, she does not have the staff to handle her if she is not eating or walking.  Tammy states, Patient is tough and should she improve, they would accept her back.  Palliative nurse updated, she will meet with family again on Wednesday.    Expected Discharge Plan: Home w Hospice Care Barriers to Discharge: Family Issues, Continued Medical Work up, Other (comment)(need for Palliative consutl)  Expected Discharge Plan and Services Expected Discharge Plan: Home w Hospice Care

## 2019-02-16 NOTE — Consult Note (Addendum)
Consultation Note Date: 02/16/2019   Patient Name: [name]  DOB: 1928-02-19  MRN: [id number]  Age / Sex: 86 y.o., female  PCP: Asencion Noble, MD Referring Physician: Murlean Iba, MD  Reason for Consultation: Establishing goals of care  HPI/Patient Profile: 86 y.o. female  with past medical history of dementia, hypertension, osteoporosis, GERD, anxiety admitted on 02/11/2019 from [facility] with generalized weakness and unable to ambulate. Found to have pneumonia along with severe dehydration and somnolence. Family are considering transition to comfort measures and hospice options. Overall failure to thrive gradually worsening over past few years and worsened by acute illness.   Clinical Assessment and Goals of Care: I met today at [name]' bedside with daughter, [name], and son/HCPOA, [name]. Unfortunately the other 2 brothers that have been visiting are not coming to meet today as they were unhappy that I did not come to the room or notify them of meeting time. I apologized to family as I was running behind yesterday and knew I was unable to meet with them yesterday so I asked RN who the best contact to arrange meeting and they told me [name]. [name] tells me that she did notify her brothers but they are only now notifying her they are not coming. They also report the other brothers do not feel that a meeting is necessary since she is improved. I expressed my only concern is that we keep [name] at the center of the conversation as all anyone wants is to make sure we are doing what is best for her - recognizing that what this looks like may differ amongst family based on our own beliefs and experiences.   I was able to speak with [name] (who gave me copy of HCPOA which I placed in shadow chart) and [name] at bedside. [name] is slightly improved as she is more alert and drinking better (finished  Glucerna during my visit) but still requiring 4L oxygen, not eating solid foods well, and is overall tired and fatigued. I worry about her overall ability to improve much further than this. They understand. We discussed paths of more aggressive care with SNF rehab (I doubt she has energy to participate in rehab) as well as potential to return to ALF with hospice to focus on support and comfort. I will ask CSW/CMRN if back to [facility] with hospice would be a realistic option to meet her increased care needs.   I also went over MOST form with [name] and [name]. I encouraged them to continue conversations as a family and to let me know if family decides that they would like to meet and discuss. They report difficulty with these discussions in the past when their father died. Family do not appear to be on the same page with goals of care and I encouraged more conversation amongst themselves. [name] is frustrated and feels that her mother is tired and "ready to go to heaven." Unfortunately no decisions made today. I will continue to follow for support.  Primary Decision Maker HCPOA [name] - in the event of family discourse in decisions [name] is the [name] and has ultimate decision making abilities    SUMMARY OF RECOMMENDATIONS   - Family dynamics complicated - No decisions made today; continue on current treatment plan - Discussed MOST form and aggressive vs comfort paths moving forward - Will be available to meet with family as desired (here through Thursday)  Code Status/Advance Care Planning:  DNR   Symptom Management:   Per attending.  She had oxygen removed when I entered and appeared short of breath. Replaced oxygen with moderate improvement. Very low reserve.   Palliative Prophylaxis:   Aspiration, Bowel Regimen, Delirium Protocol, Frequent Pain Assessment, Oral Care and Turn Reposition  Psycho-social/Spiritual:   Desire for further Chaplaincy support:no  Additional  Recommendations: Education on Hospice and Grief/Bereavement Support  Prognosis:   Overall prognosis is poor and < 6 months. Has had some improvement and may not be < 2 weeks but very tenuous and high risk for acute decline and for this to change.   Discharge Planning: To Be Determined      Primary Diagnoses: Present on Admission: . CAP (community acquired pneumonia) . Hypokalemia . Dementia ([place]) . Dehydration . Metabolic encephalopathy   I have reviewed the medical record, interviewed the patient and family, and examined the patient. The following aspects are pertinent.  Past Medical History:  Diagnosis Date  . Anxiety   . Dementia ([place])   . GERD (gastroesophageal reflux disease)   . Goiter   . Hypertension   . Osteoarthritis   . Osteoporosis   . Vitamin D insufficiency    Social History   Socioeconomic History  . Marital status: Widowed    Spouse name: Not on file  . Number of children: Not on file  . Years of education: Not on file  . Highest education level: Not on file  Occupational History  . Not on file  Tobacco Use  . Smoking status: Never Smoker  . Smokeless tobacco: Never Used  Substance and Sexual Activity  . Alcohol use: No  . Drug use: No  . Sexual activity: Never  Other Topics Concern  . Not on file  Social History Narrative  . Not on file   Social Determinants of Health   Financial Resource Strain:   . Difficulty of Paying Living Expenses: Not on file  Food Insecurity:   . Worried About Charity fundraiser in the Last Year: Not on file  . Ran Out of Food in the Last Year: Not on file  Transportation Needs:   . Lack of Transportation (Medical): Not on file  . Lack of Transportation (Non-Medical): Not on file  Physical Activity:   . Days of Exercise per Week: Not on file  . Minutes of Exercise per Session: Not on file  Stress:   . Feeling of Stress : Not on file  Social Connections:   . Frequency of Communication with Friends and  Family: Not on file  . Frequency of Social Gatherings with Friends and Family: Not on file  . Attends Religious Services: Not on file  . Active Member of Clubs or Organizations: Not on file  . Attends Archivist Meetings: Not on file  . Marital Status: Not on file   Family History  Problem Relation Age of Onset  . Heart attack Father   . Heart disease Neg Hx    Scheduled Meds: . guaiFENesin  600 mg Oral BID  .  ipratropium-albuterol  3 mL Nebulization BID  . losartan  100 mg Oral Daily  . pantoprazole  40 mg Oral Q0600   Continuous Infusions: . cefTRIAXone (ROCEPHIN)  IV 1 g (02/16/19 0527)   PRN Meds:.acetaminophen, bisacodyl, diphenhydrAMINE, glycopyrrolate **OR** glycopyrrolate **OR** glycopyrrolate, loperamide, ondansetron (ZOFRAN) IV, ondansetron **OR** ondansetron (ZOFRAN) IV, polyvinyl alcohol Allergies  Allergen Reactions  . Hydrocodone-Acetaminophen Nausea And Vomiting   Review of Systems  Unable to perform ROS: Dementia  Constitutional: Positive for fatigue.       Just feels "not good"  Respiratory: Positive for shortness of breath.   Neurological: Positive for weakness.    Physical Exam Vitals and nursing note reviewed.  Constitutional:      Appearance: She is ill-appearing.     Comments: Thin, frail. Frequently moans with generalized weakness/discomfort that she cannot describe  Cardiovascular:     Rate and Rhythm: Normal rate.  Pulmonary:     Effort: Accessory muscle usage present. No tachypnea or respiratory distress.     Comments: Appears short of breath relieved but not eliminated by replacing her oxygen in her nose Abdominal:     General: Abdomen is flat.  Neurological:     Mental Status: She is easily aroused.     Comments: Easily aroused to alertness; able to verbalize; baseline confusion     Vital Signs: BP (!) 173/75   Pulse 83   Temp 98.1 F (36.7 C) (Oral)   Resp 16   Ht '5\' 3"'$  (1.6 m)   Wt 56.7 kg   SpO2 92%   BMI 22.14 kg/m   Pain Scale: Faces   Pain Score: 0-No pain   SpO2: SpO2: 92 % O2 Device:SpO2: 92 % O2 Flow Rate: .O2 Flow Rate (L/min): 4 L/min  IO: Intake/output summary: No intake or output data in the 24 hours ending 02/16/19 0859  LBM: Last BM Date: 02/14/19 Baseline Weight: Weight: 56.7 kg Most recent weight: Weight: 56.7 kg     Palliative Assessment/Data: 30%     Time In: 1000 Time Out: 1110 Time Total: 70 min Greater than 50%  of this time was spent counseling and coordinating care related to the above assessment and plan.  Signed by: Vinie Sill, NP Palliative Medicine Team Pager # [phone] (M-F 8a-5p) Team Phone # [phone] (Nights/Weekends)

## 2019-02-16 NOTE — Progress Notes (Signed)
PROGRESS NOTE [facility]   [name]  QQI:[id number]  DOB: 1928/06/26  DOA: 02/11/2019 PCP: Asencion Noble, MD  Brief Admission Hx: 86 year old female with advanced dementia, hypertension, gait instability, chronic debility, hypokalemia, osteoporosis and hypertension who presented with hypoxic respiratory failure from pneumonia, severe dehydration and altered mentation with severe somnolence.  MDM/Assessment & Plan:   1. Acute hypoxic respiratory failure-secondary to pneumonia-treating with IV antibiotics and supportive care, COVID-19 and influenza tests negative.  Pt still requiring 4L [place] supplemental oxygen at this time. Pt remains frail.  2. Advanced dementia-patient has been maintained on Aricept.  Family reporting precipitous decline over the past several years.  Pt is not eating and drinking without constant support and encouragement from family at bedside.  3. Essential hypertension-resumed home losartan. Following.  Good control at this time.  4. Goals of care-I had a discussion with the patient's son and daughter at bedside and because patient has large family they would prefer that all siblings assemble for a conference with palliative medicine team for goals of care. I spoke [place] with palliative care and she will make arrangements to meet with them.   5. DNR do not resuscitate  Code Status: DNR Family Communication: family wants to meet with palliative medicine to discuss goals of care Disposition Plan: waiting on goals of care discussions, continue current management per family's wishes  Subjective: Patient more alert and drinking today.  She remains frail and requires a lot of support and assistance.    Objective: Vitals:   02/15/19 2058 02/16/19 0547 02/16/19 0806 02/16/19 1318  BP: 130/63 (!) 173/75  127/87  Pulse: 99 83  95  Resp: 16 16  (!) 24  Temp: 97.9 F (36.6 C) 98.1 F (36.7 C)  98.3 F (36.8 C)  TempSrc: Oral Oral  Oral  SpO2: 92% 93% 92% 93%   Weight:      Height:       No intake or output data in the 24 hours ending 02/16/19 1535 Filed Weights   02/11/19 0152 02/11/19 1900  Weight: 56.7 kg 56.7 kg     REVIEW OF SYSTEMS  Patient is too somnolent to answer questions  Exam:  General exam: Elderly chronically ill-appearing female lying in bed she is somnolent but arousable and in no apparent distress.  Her somnolence is slightly improved today.   Respiratory system: shallow breathing, rales right lower lobe, no increased work of breathing. Cardiovascular system: normal S1 & S2 heard.  Gastrointestinal system: Abdomen is nondistended, soft and nontender. Normal bowel sounds heard. Central nervous system: Somnolent with advanced dementia no focal neurological deficits. Extremities: no cyanosis seen.  Data Reviewed: Basic Metabolic Panel: Recent Labs  Lab 02/11/19 0241 02/12/19 0704 02/13/19 0959 02/14/19 0644 02/16/19 0841  NA 134* 138 137 140 138  K 3.1* 3.4* 2.7* 3.8 3.4*  CL 98 102 104 104 102  CO2 26 26 25 24 26   GLUCOSE 112* 105* 190* 129* 116*  BUN 25* 16 14 16 16   CREATININE 0.83 0.72 0.70 0.56 0.57  CALCIUM 8.6* 8.5* 8.3* 8.6* 8.5*  MG  --   --  1.8  --   --    Liver Function Tests: Recent Labs  Lab 02/11/19 0241  AST 19  ALT 11  ALKPHOS 43  BILITOT 0.7  PROT 7.3  ALBUMIN 3.3*   No results for input(s): LIPASE, AMYLASE in the last 168 hours. No results for input(s): AMMONIA in the last 168 hours. CBC: Recent Labs  Lab 02/11/19 0241 02/12/19 0704  WBC 4.2 5.7  NEUTROABS 3.1  --   HGB 12.9 12.1  HCT 39.8 38.1  MCV 96.4 96.0  PLT 211 181   Cardiac Enzymes: No results for input(s): CKTOTAL, CKMB, CKMBINDEX, TROPONINI in the last 168 hours. CBG (last 3)  Recent Labs    02/14/19 2107 02/16/19 0728 02/16/19 1124  GLUCAP 182* 101* 125*   Recent Results (from the past 240 hour(s))  Culture, blood (routine x 2)     Status: None (Preliminary result)   Collection Time: 02/11/19  4:50  AM   Specimen: BLOOD  Result Value Ref Range Status   Specimen Description BLOOD RIGHT ANTECUBITAL  Final   Special Requests   Final    BOTTLES DRAWN AEROBIC ONLY Blood Culture adequate volume   Culture   Final    NO GROWTH 4 DAYS Performed at [facility], [address] [place], [place] [address]    Report Status PENDING  Incomplete  Culture, blood (routine x 2)     Status: None (Preliminary result)   Collection Time: 02/11/19  5:34 AM   Specimen: BLOOD  Result Value Ref Range Status   Specimen Description BLOOD BLOOD RIGHT HAND  Final   Special Requests   Final    BOTTLES DRAWN AEROBIC AND ANAEROBIC Blood Culture adequate volume   Culture   Final    NO GROWTH 4 DAYS Performed at [facility], [address] [place], [place] [address]    Report Status PENDING  Incomplete  Respiratory Panel by RT PCR (Flu A&B, Covid) - Nasopharyngeal Swab     Status: None   Collection Time: 02/11/19  5:41 AM   Specimen: Nasopharyngeal Swab  Result Value Ref Range Status   SARS Coronavirus 2 by RT PCR NEGATIVE NEGATIVE Final    Comment: (NOTE) SARS-CoV-2 target nucleic acids are NOT DETECTED. The SARS-CoV-2 RNA is generally detectable in upper respiratoy specimens during the acute phase of infection. The lowest concentration of SARS-CoV-2 viral copies this assay can detect is 131 copies/mL. A negative result does not preclude SARS-Cov-2 infection and should not be used as the sole basis for treatment or other patient management decisions. A negative result may occur with  improper specimen collection/handling, submission of specimen other than nasopharyngeal swab, presence of viral mutation(s) within the areas targeted by this assay, and inadequate number of viral copies (<131 copies/mL). A negative result must be combined with clinical observations, patient history, and epidemiological information. The expected result is Negative. Fact Sheet for Patients:    https://www.moore.com/ Fact Sheet for Healthcare Providers:  https://www.young.biz/ This test is not yet ap proved or cleared by the [place] FDA and  has been authorized for detection and/or diagnosis of SARS-CoV-2 by FDA under an Emergency Use Authorization (EUA). This EUA will remain  in effect (meaning this test can be used) for the duration of the COVID-19 declaration under Section 564(b)(1) of the Act, 21 U.S.C. section 360bbb-3(b)(1), unless the authorization is terminated or revoked sooner.    Influenza A by PCR NEGATIVE NEGATIVE Final   Influenza B by PCR NEGATIVE NEGATIVE Final    Comment: (NOTE) The Xpert Xpress SARS-CoV-2/FLU/RSV assay is intended as an aid in  the diagnosis of influenza from Nasopharyngeal swab specimens and  should not be used as a sole basis for treatment. Nasal washings and  aspirates are unacceptable for Xpert Xpress SARS-CoV-2/FLU/RSV  testing. Fact Sheet for Patients: https://www.moore.com/ Fact Sheet for Healthcare Providers: https://www.young.biz/ This test is  not yet approved or cleared by the [place] and  has been authorized for detection and/or diagnosis of SARS-CoV-2 by  FDA under an Emergency Use Authorization (EUA). This EUA will remain  in effect (meaning this test can be used) for the duration of the  Covid-19 declaration under Section 564(b)(1) of the Act, 21  U.S.C. section 360bbb-3(b)(1), unless the authorization is  terminated or revoked. Performed at [facility], [address], [place], [place] [address]   MRSA PCR Screening     Status: None   Collection Time: 02/11/19  9:21 PM   Specimen: Nasal Mucosa; Nasopharyngeal  Result Value Ref Range Status   MRSA by PCR NEGATIVE NEGATIVE Final    Comment:        The GeneXpert MRSA Assay (FDA approved for NASAL specimens only), is one component of a comprehensive MRSA  colonization surveillance program. It is not intended to diagnose MRSA infection nor to guide or monitor treatment for MRSA infections. Performed at [facility], [address], [place], [place] [address]      Studies: No results found.   Scheduled Meds: . guaiFENesin  600 mg Oral BID  . ipratropium-albuterol  3 mL Nebulization BID  . losartan  100 mg Oral Daily  . pantoprazole  40 mg Oral Q0600   Continuous Infusions: . cefTRIAXone (ROCEPHIN)  IV 1 g (02/16/19 0527)    Active Problems:   Abnormality of gait   CAP (community acquired pneumonia)   Dementia (HCC)   Hypokalemia   Weakness   Hypoxia   Dehydration   Metabolic encephalopathy   DNR (do not resuscitate)   Goals of care, counseling/discussion   Comfort measures only status   Failure to thrive in adult  Time spent:   Standley Dakins, MD [facility] 02/16/2019, 3:35 PM    LOS: 5 days  How to contact the [facility] Attending or Consulting provider 7A - 7P or covering provider during after hours 7P -7A, for this patient?  1. Check the care team in [facility] and look for a) attending/consulting TRH provider listed and b) the [facility] team listed 2. Log into [email or web address] and use [place]'s universal password to access. If you do not have the password, please contact the hospital operator. 3. Locate the [facility] provider you are looking for under [facility] and page to a number that you can be directly reached. 4. If you still have difficulty reaching the provider, please page the [facility] (Director on Call) for the Hospitalists listed on amion for assistance.

## 2019-02-17 ENCOUNTER — Inpatient Hospital Stay (HOSPITAL_COMMUNITY): Payer: Medicare Other

## 2019-02-17 DIAGNOSIS — I10 Essential (primary) hypertension: Secondary | ICD-10-CM

## 2019-02-17 DIAGNOSIS — J9601 Acute respiratory failure with hypoxia: Secondary | ICD-10-CM

## 2019-02-17 DIAGNOSIS — G9341 Metabolic encephalopathy: Secondary | ICD-10-CM

## 2019-02-17 LAB — CBC
HCT: 35.9 % — ABNORMAL LOW (ref 36.0–46.0)
Hemoglobin: 11.2 g/dL — ABNORMAL LOW (ref 12.0–15.0)
MCH: 30.4 pg (ref 26.0–34.0)
MCHC: 31.2 g/dL (ref 30.0–36.0)
MCV: 97.6 fL (ref 80.0–100.0)
Platelets: 256 10*3/uL (ref 150–400)
RBC: 3.68 MIL/uL — ABNORMAL LOW (ref 3.87–5.11)
RDW: 13.6 % (ref 11.5–15.5)
WBC: 7.6 10*3/uL (ref 4.0–10.5)
nRBC: 0 % (ref 0.0–0.2)

## 2019-02-17 LAB — CULTURE, BLOOD (ROUTINE X 2)
Culture: NO GROWTH
Culture: NO GROWTH
Special Requests: ADEQUATE
Special Requests: ADEQUATE

## 2019-02-17 LAB — MAGNESIUM: Magnesium: 2.1 mg/dL (ref 1.7–2.4)

## 2019-02-17 LAB — GLUCOSE, CAPILLARY
Glucose-Capillary: 104 mg/dL — ABNORMAL HIGH (ref 70–99)
Glucose-Capillary: 126 mg/dL — ABNORMAL HIGH (ref 70–99)
Glucose-Capillary: 167 mg/dL — ABNORMAL HIGH (ref 70–99)

## 2019-02-17 LAB — BASIC METABOLIC PANEL
Anion gap: 10 (ref 5–15)
BUN: 15 mg/dL (ref 8–23)
CO2: 25 mmol/L (ref 22–32)
Calcium: 8.7 mg/dL — ABNORMAL LOW (ref 8.9–10.3)
Chloride: 103 mmol/L (ref 98–111)
Creatinine, Ser: 0.48 mg/dL (ref 0.44–1.00)
GFR calc Af Amer: >60 mL/min (ref >60)
GFR calc non Af Amer: >60 mL/min (ref >60)
Glucose, Bld: 114 mg/dL — ABNORMAL HIGH (ref 70–99)
Potassium: 4.1 mmol/L (ref 3.5–5.1)
Sodium: 138 mmol/L (ref 135–145)

## 2019-02-17 LAB — TSH: TSH: 0.369 u[IU]/mL (ref 0.350–4.500)

## 2019-02-17 LAB — VITAMIN B12: Vitamin B-12: 251 pg/mL (ref 180–914)

## 2019-02-17 LAB — FOLATE: Folate: 7.3 ng/mL (ref >5.9)

## 2019-02-17 LAB — D-DIMER, QUANTITATIVE: D-Dimer, Quant: 1.51 ug/mL-FEU — ABNORMAL HIGH (ref 0.00–0.50)

## 2019-02-17 LAB — T4, FREE: Free T4: 1.18 ng/dL — ABNORMAL HIGH (ref 0.61–1.12)

## 2019-02-17 LAB — BRAIN NATRIURETIC PEPTIDE: B Natriuretic Peptide: 119 pg/mL — ABNORMAL HIGH (ref 0.0–100.0)

## 2019-02-17 LAB — PROCALCITONIN: Procalcitonin: <0.1 ng/mL

## 2019-02-17 LAB — AMMONIA: Ammonia: 21 umol/L (ref 9–35)

## 2019-02-17 MED ORDER — BENZONATATE 100 MG PO CAPS: 200.0000 mg | ORAL_CAPSULE | Freq: Three times a day (TID) | ORAL | Status: DC | PRN

## 2019-02-17 MED ORDER — IOHEXOL 350 MG/ML SOLN
100.0000 mL | Freq: Once | INTRAVENOUS | Status: AC | PRN
  Administered 2019-02-17: 100 mL via INTRAVENOUS

## 2019-02-17 MED ORDER — ACETAMINOPHEN 500 MG PO TABS
1000.0000 mg | ORAL_TABLET | Freq: Three times a day (TID) | ORAL | Status: DC
  Administered 2019-02-17 – 2019-02-19 (×6): 1000 mg via ORAL
  Filled 2019-02-17 (×6): qty 2

## 2019-02-17 NOTE — Progress Notes (Signed)
PROGRESS NOTE  [name] GBT:[id number] DOB: 1928-07-03 DOA: 02/11/2019 PCP: Asencion Noble, MD  Brief History:  86 year old female with a history of dementia, hypertension, GERD, osteoporosis presenting from [facility] assisted living with generalized weakness and hypoxia.  Apparently, the patient also had increasing confusion.  In the emergency department, the patient was afebrile hemodynamically stable but she was hypoxic with oxygenation of 86% on room air.  Chest x-ray at that time showed bilateral perihilar and bibasilar densities concerning for pneumonia.  The patient was started on ceftriaxone and azithromycin.  Unfortunately, the patient continues to remain hypoxic during the hospitalization despite treatment for her pneumonia.  Although her confusion is somewhat better, she remains confused with episodes of agitation at nighttime.  Palliative medicine was consulted to assist with management.  Goals of care discussion were undertaken.  The patient's CODE STATUS was changed to DNR.  Assessment/Plan: Acute respiratory failure with hypoxia -Originally thought to be secondary to pneumonia -Patient remains hypoxic on 6 L nasal cannula -Azithromycin 02/11/2019>>> 02/14/2019 -Ceftriaxone 02/11/2019>>> 02/17/2019 -CT chest -BMP -D-dimer  Acute metabolic encephalopathy -Multifactorial including infectious process, hypoxia, and possible progression of her underlying dementia -Serum X10 -TSH -Folic acid -Ammonia -07/07/6946 UA without pyuria  Essential hypertension -Continue losartan  Dementia -Patient previously on Aricept  Goals of care discussion -discussed with son at bedside 02/17/19 -Advance care planning, including the explanation and discussion of advance directives was carried out with the patient and family.  Code status including explanations of "Full Code" and "DNR" and alternatives were discussed in detail.  Discussion of end-of-life issues including but not limited  palliative care, hospice care and the concept of hospice, other end-of-life care options, power of attorney for health care decisions, living wills, and physician orders for life-sustaining treatment were also discussed with the patient and family.  Total face to face time 20 minutes. -confirmed DNR -palliative medicine following    Disposition Plan:   SNF when oxygenation better  Family Communication:   Son updated at bedside 02/17/19  Consultants:  Palliative medicine  Code Status:  DNR  DVT Prophylaxis:  [place] Heparin / [place] Lovenox   Procedures: As Listed in Progress Note Above  Antibiotics: azithro and ceftriaxone as above       Subjective: Pt is pleasantly confused.  She is able to follow one-step simple commands.  She denies any chest pain, vomiting, diarrhea.  Remainder review of systems unobtainable secondary to patient's confusion.  Objective: Vitals:   02/16/19 1954 02/16/19 2105 02/17/19 0548 02/17/19 0751  BP:  (!) 151/72 (!) 150/74   Pulse:  86 73   Resp:  17 16   Temp:  98.4 F (36.9 C) 97.7 F (36.5 C)   TempSrc:  Oral Oral   SpO2: 92% 91% 91% 91%  Weight:      Height:        Intake/Output Summary (Last 24 hours) at 02/17/2019 1605 Last data filed at 02/17/2019 0900 Gross per 24 hour  Intake 120 ml  Output --  Net 120 ml   Weight change:  Exam:   General:  Pt is alert, follow commands intermittently, not in acute distress  HEENT: No icterus, No thrush, No neck mass, [place]/AT  Cardiovascular: RRR, S1/S2, no rubs, no gallops  Respiratory: Bibasilar crackles.  No wheezing.  Good air movement.  Abdomen: Soft/+BS, non tender, non distended, no guarding  Extremities: No edema, No lymphangitis, No petechiae, No rashes, no synovitis  Data Reviewed: I have personally reviewed following labs and imaging studies Basic Metabolic Panel: Recent Labs  Lab 02/12/19 0704 02/13/19 0959 02/14/19 0644 02/16/19 0841 02/17/19 0600  NA 138 137 140 138 138   K 3.4* 2.7* 3.8 3.4* 4.1  CL 102 104 104 102 103  CO2 26 25 24 26 25   GLUCOSE 105* 190* 129* 116* 114*  BUN 16 14 16 16 15   CREATININE 0.72 0.70 0.56 0.57 0.48  CALCIUM 8.5* 8.3* 8.6* 8.5* 8.7*  MG  --  1.8  --   --  2.1   Liver Function Tests: Recent Labs  Lab 02/11/19 0241  AST 19  ALT 11  ALKPHOS 43  BILITOT 0.7  PROT 7.3  ALBUMIN 3.3*   No results for input(s): LIPASE, AMYLASE in the last 168 hours. No results for input(s): AMMONIA in the last 168 hours. Coagulation Profile: No results for input(s): INR, PROTIME in the last 168 hours. CBC: Recent Labs  Lab 02/11/19 0241 02/12/19 0704 02/17/19 0600  WBC 4.2 5.7 7.6  NEUTROABS 3.1  --   --   HGB 12.9 12.1 11.2*  HCT 39.8 38.1 35.9*  MCV 96.4 96.0 97.6  PLT 211 181 256   Cardiac Enzymes: No results for input(s): CKTOTAL, CKMB, CKMBINDEX, TROPONINI in the last 168 hours. BNP: Invalid input(s): POCBNP CBG: Recent Labs  Lab 02/16/19 1124 02/16/19 1648 02/16/19 2107 02/17/19 0741 02/17/19 1128  GLUCAP 125* 113* 129* 104* 126*   HbA1C: No results for input(s): HGBA1C in the last 72 hours. Urine analysis:    Component Value Date/Time   COLORURINE YELLOW 02/11/2019 0333   APPEARANCEUR HAZY (A) 02/11/2019 0333   LABSPEC 1.029 02/11/2019 0333   PHURINE 5.0 02/11/2019 0333   GLUCOSEU NEGATIVE 02/11/2019 0333   HGBUR SMALL (A) 02/11/2019 0333   BILIRUBINUR NEGATIVE 02/11/2019 0333   KETONESUR NEGATIVE 02/11/2019 0333   PROTEINUR 30 (A) 02/11/2019 0333   UROBILINOGEN 0.2 11/05/2014 2333   NITRITE NEGATIVE 02/11/2019 0333   LEUKOCYTESUR NEGATIVE 02/11/2019 0333   Sepsis Labs: @LABRCNTIP (procalcitonin:4,lacticidven:4) ) Recent Results (from the past 240 hour(s))  Culture, blood (routine x 2)     Status: None   Collection Time: 02/11/19  4:50 AM   Specimen: BLOOD  Result Value Ref Range Status   Specimen Description BLOOD RIGHT ANTECUBITAL  Final   Special Requests   Final    BOTTLES DRAWN AEROBIC  ONLY Blood Culture adequate volume   Culture   Final    NO GROWTH 6 DAYS Performed at [facility], [address], [place], [facility] [address]    Report Status 02/17/2019 FINAL  Final  Culture, blood (routine x 2)     Status: None   Collection Time: 02/11/19  5:34 AM   Specimen: BLOOD  Result Value Ref Range Status   Specimen Description BLOOD BLOOD RIGHT HAND  Final   Special Requests   Final    BOTTLES DRAWN AEROBIC AND ANAEROBIC Blood Culture adequate volume   Culture   Final    NO GROWTH 6 DAYS Performed at [facility], [address] [place], [facility] [address]    Report Status 02/17/2019 FINAL  Final  Respiratory Panel by RT PCR (Flu A&B, Covid) - Nasopharyngeal Swab     Status: None   Collection Time: 02/11/19  5:41 AM   Specimen: Nasopharyngeal Swab  Result Value Ref Range Status   SARS Coronavirus 2 by RT PCR NEGATIVE NEGATIVE Final    Comment: (NOTE) SARS-CoV-2 target nucleic acids  are NOT DETECTED. The SARS-CoV-2 RNA is generally detectable in upper respiratoy specimens during the acute phase of infection. The lowest concentration of SARS-CoV-2 viral copies this assay can detect is 131 copies/mL. A negative result does not preclude SARS-Cov-2 infection and should not be used as the sole basis for treatment or other patient management decisions. A negative result may occur with  improper specimen collection/handling, submission of specimen other than nasopharyngeal swab, presence of viral mutation(s) within the areas targeted by this assay, and inadequate number of viral copies (<131 copies/mL). A negative result must be combined with clinical observations, patient history, and epidemiological information. The expected result is Negative. Fact Sheet for Patients:  https://www.moore.com/ Fact Sheet for Healthcare Providers:  https://www.young.biz/ This test is not yet ap proved or cleared by the [place] FDA and  has been  authorized for detection and/or diagnosis of SARS-CoV-2 by FDA under an Emergency Use Authorization (EUA). This EUA will remain  in effect (meaning this test can be used) for the duration of the COVID-19 declaration under Section 564(b)(1) of the Act, 21 U.S.C. section 360bbb-3(b)(1), unless the authorization is terminated or revoked sooner.    Influenza A by PCR NEGATIVE NEGATIVE Final   Influenza B by PCR NEGATIVE NEGATIVE Final    Comment: (NOTE) The Xpert Xpress SARS-CoV-2/FLU/RSV assay is intended as an aid in  the diagnosis of influenza from Nasopharyngeal swab specimens and  should not be used as a sole basis for treatment. Nasal washings and  aspirates are unacceptable for Xpert Xpress SARS-CoV-2/FLU/RSV  testing. Fact Sheet for Patients: https://www.moore.com/ Fact Sheet for Healthcare Providers: https://www.young.biz/ This test is not yet approved or cleared by the [place] FDA and  has been authorized for detection and/or diagnosis of SARS-CoV-2 by  FDA under an Emergency Use Authorization (EUA). This EUA will remain  in effect (meaning this test can be used) for the duration of the  Covid-19 declaration under Section 564(b)(1) of the Act, 21  U.S.C. section 360bbb-3(b)(1), unless the authorization is  terminated or revoked. Performed at [facility], [address], [place], [place] [address]   MRSA PCR Screening     Status: None   Collection Time: 02/11/19  9:21 PM   Specimen: Nasal Mucosa; Nasopharyngeal  Result Value Ref Range Status   MRSA by PCR NEGATIVE NEGATIVE Final    Comment:        The GeneXpert MRSA Assay (FDA approved for NASAL specimens only), is one component of a comprehensive MRSA colonization surveillance program. It is not intended to diagnose MRSA infection nor to guide or monitor treatment for MRSA infections. Performed at [facility], [address] [place], [place] [address]      Scheduled  Meds: . acetaminophen  1,000 mg Oral Q8H  . guaiFENesin  600 mg Oral BID  . ipratropium-albuterol  3 mL Nebulization BID  . losartan  100 mg Oral Daily  . pantoprazole  40 mg Oral Q0600   Continuous Infusions: . cefTRIAXone (ROCEPHIN)  IV 1 g (02/17/19 0545)    Procedures/Studies: CT Head Wo Contrast  Result Date: 01/22/2019 CLINICAL DATA:  Headache EXAM: CT HEAD WITHOUT CONTRAST TECHNIQUE: Contiguous axial images were obtained from the base of the skull through the vertex without intravenous contrast. COMPARISON:  12/28/2012 FINDINGS: Brain: There is atrophy and chronic small vessel disease changes. No acute intracranial abnormality. Specifically, no hemorrhage, hydrocephalus, mass lesion, acute infarction, or significant intracranial injury. Vascular: No hyperdense vessel or unexpected calcification. Skull: No acute calvarial abnormality.  Sinuses/Orbits: Visualized paranasal sinuses and mastoids clear. Orbital soft tissues unremarkable. Other: None IMPRESSION: Atrophy, chronic microvascular disease. No acute intracranial abnormality. Electronically Signed   By: Charlett Nose M.D.   On: 01/22/2019 21:28   CT Cervical Spine Wo Contrast  Result Date: 01/22/2019 CLINICAL DATA:  Unwitnessed fall. EXAM: CT CERVICAL SPINE WITHOUT CONTRAST TECHNIQUE: Multidetector CT imaging of the cervical spine was performed without intravenous contrast. Multiplanar CT image reconstructions were also generated. COMPARISON:  None. FINDINGS: Alignment: No subluxation. Skull base and vertebrae: No acute fracture. No primary bone lesion or focal pathologic process. Soft tissues and spinal canal: No prevertebral fluid or swelling. No visible canal hematoma. Disc levels:  Disc spaces maintained. Upper chest: Scarring in the right apex. Other: Enlarged multinodular thyroid. 3.3 cm nodule/mass in the isthmus. IMPRESSION: No acute bony abnormality. Multinodular thyroid with 3.3 cm mass in the isthmus, likely multinodular  goiter. Given the patient's age, no follow-up recommended unless clinically warranted(ref: J Am Coll Radiol. 2015 Feb;12(2): 143-50). Electronically Signed   By: Charlett Nose M.D.   On: 01/22/2019 21:35   DG Chest Port 1 View  Result Date: 02/11/2019 CLINICAL DATA:  86 year old female with weakness. EXAM: PORTABLE CHEST 1 VIEW COMPARISON:  Chest radiograph dated 14 18. FINDINGS: Bilateral perihilar and bibasilar streaky densities concerning for developing infiltrate. There is blunting of the costophrenic angles which may represent small pleural effusions. No pneumothorax. Stable cardiac silhouette. Atherosclerotic calcification of the aorta. No acute osseous pathology. IMPRESSION: 1. Bilateral perihilar and bibasilar streaky densities concerning for developing infiltrate. Follow-up recommended. 2. Probable small bilateral pleural effusions. Electronically Signed   By: Elgie Collard M.D.   On: 02/11/2019 02:41   CT Maxillofacial Wo Contrast  Result Date: 01/22/2019 CLINICAL DATA:  Unwitnessed fall. Facial trauma. Laceration to nose. EXAM: CT MAXILLOFACIAL WITHOUT CONTRAST TECHNIQUE: Multidetector CT imaging of the maxillofacial structures was performed. Multiplanar CT image reconstructions were also generated. COMPARISON:  None. FINDINGS: Osseous: No fracture or mandibular dislocation. No destructive process. Orbits: Negative. No traumatic or inflammatory finding. Sinuses: Clear Soft tissues: Negative Limited intracranial: No acute findings. IMPRESSION: No facial or orbital fracture. Electronically Signed   By: Charlett Nose M.D.   On: 01/22/2019 21:32    Catarina Hartshorn, DO  [facility] Pager [phone]  If 7PM-7AM, please contact night-coverage [email or web address] Password [email or web address] 02/17/2019, 4:05 PM   LOS: 6 days

## 2019-02-17 NOTE — Care Management Important Message (Signed)
Important Message  Patient Details  Name: [name] MRN: [id number] Date of Birth: Feb 25, 1928   Medicare Important Message Given:  Yes     Corey Harold 02/17/2019, 2:57 PM

## 2019-02-17 NOTE — Progress Notes (Signed)
Palliative:  HPI: 86 y.o. female  with past medical history of dementia, hypertension, osteoporosis, GERD, anxiety admitted on 02/11/2019 from [facility] SN[facility] with generalized weakness and unable to ambulate. Found to have pneumonia along with severe dehydration and somnolence. Family are considering transition to comfort measures and hospice options. Overall failure to thrive gradually worsening over past few years and worsened by acute illness. Much confusion regarding goals of care and where to go from here. Multiple family members that have differing opinions with hopes of developing common goal.   I met today at [name]' bedside with son, [name], and granddaughter, [name] Investment banker, [employer]). There has been much confusion and I was unsuccessful to reach [facility] earlier. [name] was able to get [name] on the phone who appoints [name] family spokesperson and contact. [name] is overwhelmed with his own health concerns. We also discussed current goal is hopeful for some improvement and transition to [facility] for rehab. They are aware that she could continue to decline and would consider transition to comfort care at that stage but would like to continue to try and see what improvement they can get for now. I left MOST form with [name] and [name] as well. [name] is familiar with MOST form and will help discuss with her family.   All questions/concerns addressed. Emotional support provided.   Exam: Sleepy but arouses. She will converse and follow commands. Appetite remains poor. Breathing with increased efforts, sats 89-90% on 5L. Abd soft. Generalized weakness and frailty.   Plan: - Restart Tylenol 1000 mg TID to assist with comfort (she takes this at home).  - Tessalon as needed for cough.  - Avoid sedating medications. - Hopeful for ultimate transition to [facility].   [id number] 60 min  Vinie Sill, NP Palliative Medicine Team Pager [phone] (Please see [email or web address] for schedule) Team Phone  [phone]    Greater than 50%  of this time was spent counseling and coordinating care related to the above assessment and plan

## 2019-02-18 ENCOUNTER — Inpatient Hospital Stay (HOSPITAL_COMMUNITY): Payer: Medicare Other

## 2019-02-18 DIAGNOSIS — J9601 Acute respiratory failure with hypoxia: Secondary | ICD-10-CM

## 2019-02-18 LAB — HEPATIC FUNCTION PANEL
ALT: 40 U/L (ref 0–44)
AST: 31 U/L (ref 15–41)
Albumin: 2.3 g/dL — ABNORMAL LOW (ref 3.5–5.0)
Alkaline Phosphatase: 42 U/L (ref 38–126)
Bilirubin, Direct: 0.2 mg/dL (ref 0.0–0.2)
Indirect Bilirubin: 0.6 mg/dL (ref 0.3–0.9)
Total Bilirubin: 0.8 mg/dL (ref 0.3–1.2)
Total Protein: 6.6 g/dL (ref 6.5–8.1)

## 2019-02-18 LAB — ECHOCARDIOGRAM COMPLETE
Height: 63 in
Weight: 2000.01 oz

## 2019-02-18 LAB — BASIC METABOLIC PANEL
Anion gap: 7 (ref 5–15)
BUN: 13 mg/dL (ref 8–23)
CO2: 26 mmol/L (ref 22–32)
Calcium: 8.4 mg/dL — ABNORMAL LOW (ref 8.9–10.3)
Chloride: 103 mmol/L (ref 98–111)
Creatinine, Ser: 0.5 mg/dL (ref 0.44–1.00)
GFR calc Af Amer: >60 mL/min (ref >60)
GFR calc non Af Amer: >60 mL/min (ref >60)
Glucose, Bld: 114 mg/dL — ABNORMAL HIGH (ref 70–99)
Potassium: 3.8 mmol/L (ref 3.5–5.1)
Sodium: 136 mmol/L (ref 135–145)

## 2019-02-18 LAB — GLUCOSE, CAPILLARY: Glucose-Capillary: 148 mg/dL — ABNORMAL HIGH (ref 70–99)

## 2019-02-18 LAB — SARS CORONAVIRUS 2 (TAT 6-24 HRS): SARS Coronavirus 2: NEGATIVE

## 2019-02-18 MED ORDER — FUROSEMIDE 10 MG/ML IJ SOLN
40.0000 mg | Freq: Once | INTRAMUSCULAR | Status: AC
  Administered 2019-02-18: 40 mg via INTRAVENOUS
  Filled 2019-02-18: qty 4

## 2019-02-18 MED ORDER — ENSURE ENLIVE PO LIQD
237.0000 mL | Freq: Three times a day (TID) | ORAL | Status: DC
  Administered 2019-02-18 – 2019-02-19 (×2): 237 mL via ORAL

## 2019-02-18 MED ORDER — LIDOCAINE 5 % EX PTCH
1.0000 | MEDICATED_PATCH | CUTANEOUS | Status: DC
  Administered 2019-02-18 – 2019-02-19 (×2): 1 via TRANSDERMAL
  Filled 2019-02-18 (×2): qty 1

## 2019-02-18 MED ORDER — ENOXAPARIN SODIUM 40 MG/0.4ML ~~LOC~~ SOLN
40.0000 mg | SUBCUTANEOUS | Status: DC
  Administered 2019-02-18 – 2019-02-19 (×2): 40 mg via SUBCUTANEOUS
  Filled 2019-02-18 (×2): qty 0.4

## 2019-02-18 NOTE — NC FL2 (Signed)
[place] LEVEL OF CARE SCREENING TOOL     IDENTIFICATION  Patient Name: [name] Birthdate: 08-10-1928 Sex: female Admission Date (Current Location): 02/11/2019  [facility] and [place] Number:  [employer] and Address:  [facility] [address] [place]      Provider Number: [id number]  Attending Physician Name and Address:  Orson Eva, MD  Relative Name and Phone Number:       Current Level of Care: Hospital Recommended Level of Care: [place] Prior Approval Number: [id number]  Date Approved/Denied:   PASRR Number:    Discharge Plan: SNF    Current Diagnoses: Patient Active Problem List   Diagnosis Date Noted  . Acute metabolic encephalopathy 12/75/1700  . Acute respiratory failure with hypoxia ([place]) 02/17/2019  . Failure to thrive in adult   . Hypokalemia 02/14/2019  . Weakness 02/14/2019  . Hypoxia 02/14/2019  . Dehydration 02/14/2019  . Metabolic encephalopathy 17/49/4496  . DNR (do not resuscitate) 02/14/2019  . Goals of care, counseling/discussion 02/14/2019  . Comfort measures only status 02/14/2019  . Dementia ([place]) 02/12/2019  . CAP (community acquired pneumonia) 02/11/2019  . Chest pain 05/14/2016  . Goiter 05/14/2016  . Intertrochanteric fracture of right hip ([place]) 11/19/2012  . UTI (urinary tract infection) 11/19/2012  . Dizziness and giddiness 06/14/2012  . HTN (hypertension) 06/14/2012  . Difficulty in walking(719.7) 09/09/2011  . Osteoarthritis of leg 09/09/2011  . Abnormality of gait 09/09/2011  . Pain in joint, lower leg 09/09/2011    Orientation RESPIRATION BLADDER Height & Weight     Self  O2(currently 5 liters) Indwelling catheter Weight: 56.7 kg Height:  5\' 3"  (160 cm)  BEHAVIORAL SYMPTOMS/MOOD NEUROLOGICAL BOWEL NUTRITION STATUS      Incontinent Diet  AMBULATORY STATUS COMMUNICATION OF NEEDS Skin   Extensive Assist Verbally Normal                        Personal Care Assistance Level of Assistance  Bathing, Feeding, Dressing Bathing Assistance: Maximum assistance Feeding assistance: Limited assistance Dressing Assistance: Maximum assistance     Functional Limitations Info  Sight, Hearing, Speech Sight Info: Adequate Hearing Info: Adequate Speech Info: Adequate    SPECIAL CARE FACTORS FREQUENCY                       Contractures Contractures Info: Not present    Additional Factors Info  Code Status, Allergies Code Status Info: DNR Allergies Info: Hydrocodone-acetaminophen           Current Medications (02/18/2019):  This is the current hospital active medication list Current Facility-Administered Medications  Medication Dose Route Frequency Provider Last Rate Last Admin  . acetaminophen (TYLENOL) tablet 1,000 mg  1,000 mg Oral P5F Vinie Sill C, NP   1,638 mg at 02/18/19 0612  . benzonatate (TESSALON) capsule 200 mg  200 mg Oral TID PRN Pershing Proud, NP      . bisacodyl (DULCOLAX) suppository 10 mg  10 mg Rectal Daily PRN Johnson, Clanford L, MD      . diphenhydrAMINE (BENADRYL) injection 12.5 mg  12.5 mg Intravenous Q4H PRN Johnson, Clanford L, MD      . furosemide (LASIX) injection 40 mg  40 mg Intravenous Once Tat, David, MD      . glycopyrrolate (ROBINUL) tablet 1 mg  1 mg Oral Q4H PRN Murlean Iba, MD       Or  .  glycopyrrolate (ROBINUL) injection 0.2 mg  0.2 mg Subcutaneous Q4H PRN Johnson, Clanford L, MD       Or  . glycopyrrolate (ROBINUL) injection 0.2 mg  0.2 mg Intravenous Q4H PRN Johnson, Clanford L, MD      . guaiFENesin (MUCINEX) 12 hr tablet 600 mg  600 mg Oral BID Johnson, Clanford L, MD   600 mg at 02/18/19 0905  . ipratropium-albuterol (DUONEB) 0.5-2.5 (3) MG/3ML nebulizer solution 3 mL  3 mL Nebulization BID Erick Blinks, MD   3 mL at 02/18/19 0832  . loperamide (IMODIUM) capsule 2 mg  2 mg Oral Q4H PRN Meredeth Ide, MD      . losartan (COZAAR) tablet 100 mg  100 mg Oral  Daily Johnson, Clanford L, MD   100 mg at 02/18/19 0905  . ondansetron (ZOFRAN) injection 4 mg  4 mg Intravenous Q6H PRN Erick Blinks, MD      . ondansetron (ZOFRAN-ODT) disintegrating tablet 4 mg  4 mg Oral Q6H PRN Johnson, Clanford L, MD       Or  . ondansetron (ZOFRAN) injection 4 mg  4 mg Intravenous Q6H PRN Johnson, Clanford L, MD      . pantoprazole (PROTONIX) EC tablet 40 mg  40 mg Oral Q0600 Johnson, Clanford L, MD   40 mg at 02/18/19 0612  . polyvinyl alcohol (LIQUIFILM TEARS) 1.4 % ophthalmic solution 1 drop  1 drop Both Eyes QID PRN Cleora Fleet, MD         Discharge Medications: Please see discharge summary for a list of discharge medications.  Relevant Imaging Results:  Relevant Lab Results:   Additional Information SSN [id number]  Carson Bogden, Chrystine Oiler, RN

## 2019-02-18 NOTE — Progress Notes (Signed)
  Echocardiogram 2D Echocardiogram has been performed.  [name] 02/18/2019, 3:22 PM

## 2019-02-18 NOTE — Progress Notes (Signed)
Palliative:  HPI: 86 y.o.femalewith past medical history of dementia, hypertension, osteoporosis, GERD, anxietyadmitted on 1/7/2021from [facility]Fwithgeneralized weakness and unable to ambulate.Found to have pneumonia along with severe dehydration and somnolence. Family are considering transition to comfort measures and hospice options.Overall failure to thrive gradually worsening over past few years and worsened by acute illness. Much confusion regarding goals of care and where to go from here. Multiple family members that have differing opinions with hopes of developing common goal.    I have had multiple calls from family members. I have called and I spoke with Christy Sartorius. He confirms the plan that they are hopeful for some level of improvement and that she can transition to [facility]. Family understand she is very frail and tenuous but wish to continue to try and see if she will be able to improve.    Christy Sartorius is HCPOA and is main contact. If he is unavailable our next point person to contact is his brother, Marya Amsler, per my conversation with Christy Sartorius today. Christy Sartorius does have concern that communication will go through [place] and not him and I assured that he is still our first contact and no decisions will be made without his approval (unless he is unavailable and unable to make those decisions himself). I have also received call from daughter, Levada Dy, but I have requested that Christy Sartorius provide her an update for now and I will try and call her later if I am able. I encouraged improved family communications as it is difficult to be able to communicate with multiple people especially given our high volumes during this time.   I met today at Ms. Pohlmann' bedside. Son, Marya Amsler, is at bedside. Ms. Hineman is lying in bed. She arouses and smiles at me. When asked about her pain she replies "there's always a little pain" but her main concern is pain in left hand/wrist in which appears may have had an IV infiltration but  it is propped and elevated on pillow but very painful to touch. She falls back to sleep. Marya Amsler states that she ate a few bites of mashed potatoes but is drinking fluids and supplements. Marya Amsler understands that her overall prognosis is poor and is enjoying every day and moment that he is able to be with her. He is hopeful for some improvement but also prepared if that does not occur.   I also returned call to daughter, Levada Dy. Emotional support provided.   All questions/concerns addressed. Emotional support provided.   Exam: Arouses easily but sleeps most of the time. No distress. Breathing mildly labored. UOP good and she received Lasix.   Plan: - Family hopeful to optimize her health and hopeful for transition to [facility].   [place], NP Palliative Medicine Team Pager [phone] (Please see [email or web address] for schedule) Team Phone [phone]    Greater than 50%  of this time was spent counseling and coordinating care related to the above assessment and plan

## 2019-02-18 NOTE — TOC Progression Note (Addendum)
Transition of Care [facility] - Progression Note    Patient Details  Name: [name] MRN: [id number] Date of Birth: [birth year]  [facility] CM/SW Contact  Lilliemae Fruge, Chrystine Oiler, RN Phone Number: 02/18/2019, 10:06 AM  Clinical Narrative:  Discussed with grand daughter, Lurena Joiner.   Will refer to SNF per family wishes.  [facility] is first choice.     Expected Discharge Plan: Skilled Nursing Facility Barriers to Discharge: Continued Medical Work up, SNF Pending bed offer  Expected Discharge Plan and Services Expected Discharge Plan: Skilled Nursing Facility   Discharge Planning Services: CM Consult Post Acute Care Choice: Nursing Home Living arrangements for the past 2 months: Assisted Living Facility                          Social Determinants of Health (SDOH) Interventions    Readmission Risk Interventions No flowsheet data found.

## 2019-02-18 NOTE — TOC Progression Note (Signed)
Transition of Care [facility]) - Progression Note    Patient Details  Name: [name] MRN: [id number] Date of Birth: May 13, 1928  Transition of Care [facility]) CM/SW Contact  Clark Clowdus, Chrystine Oiler, RN Phone Number: 02/18/2019, 2:32 PM  Clinical Narrative:   [facility] can make a bed offer. Discussed with Lurena Joiner and son Alecia Lemming, both agreeable. Clarified with Alecia Lemming and Lurena Joiner that family will not be able to visit while patient is at rehab, both understand. Alecia Lemming will relay information to family.   Patient will need a covid test prior to DC to [facility].     Expected Discharge Plan: Skilled Nursing Facility Barriers to Discharge: Continued Medical Work up, SNF Pending bed offer  Expected Discharge Plan and Services Expected Discharge Plan: Skilled Nursing Facility   Discharge Planning Services: CM Consult Post Acute Care Choice: Nursing Home Living arrangements for the past 2 months: Assisted Living Facility                                       Social Determinants of Health (SDOH) Interventions    Readmission Risk Interventions No flowsheet data found.

## 2019-02-18 NOTE — Progress Notes (Signed)
PROGRESS NOTE  [name] XAJ:[id number] DOB: 06-18-1928 DOA: 02/11/2019 PCP: Carylon Perches, MD  Brief History:  86 year old female with a history of dementia, hypertension, GERD, osteoporosis presenting from [facility] assisted [facility] with generalized weakness and hypoxia.  Apparently, the patient also had increasing confusion.  In the emergency department, the patient was afebrile hemodynamically stable but she was hypoxic with oxygenation of 86% on room air.  Chest x-ray at that time showed bilateral perihilar and bibasilar densities concerning for pneumonia.  The patient was started on ceftriaxone and azithromycin.  Unfortunately, the patient continues to remain hypoxic during the hospitalization despite treatment for her pneumonia.  Although her confusion is somewhat better, she remains confused with episodes of agitation at nighttime.  Palliative medicine was consulted to assist with management.  Goals of care discussion were undertaken.  The patient's CODE STATUS was changed to DNR.  Assessment/Plan: Acute respiratory failure with hypoxia -secondary to pneumonia and pleural effusion -Patient remains hypoxic on 5 L nasal cannula -Azithromycin 02/11/2019>>> 02/14/2019 -Ceftriaxone 02/11/2019>>> 02/17/2019 -CTA chest--no PE;  Bilateral patchy GGO;  Small bilateral pleural effusions -D-dimer--1.51 -lasix 40 mg IV x 1 -Echo -PCT <0.10  Acute metabolic encephalopathy -Multifactorial including infectious process, hypoxia, and possible progression of her underlying dementia -Serum B12--251 -TSH--0.369 -Folic acid--7.3 -Ammonia--21 -02/11/2019 UA without pyuria -1/14--remains drowsy, but more alert and following one-step commands  Essential hypertension -Continue losartan  Dementia -Patient previously on Aricept  Goals of care discussion -discussed with son at bedside 02/17/19 -Advance care planning, including the explanation and discussion of advance directives was carried out  with the patient and family.  Code status including explanations of "Full Code" and "DNR" and alternatives were discussed in detail.  Discussion of end-of-life issues including but not limited palliative care, hospice care and the concept of hospice, other end-of-life care options, power of attorney for health care decisions, living wills, and physician orders for life-sustaining treatment were also discussed with the patient and family.  Total face to face time 20 minutes. -confirmed DNR -palliative medicine following    Disposition Plan:   SNF when oxygenation better, 1-2 days Family Communication:   Son updated 02/18/19  Consultants:  Palliative medicine  Code Status:  DNR  DVT Prophylaxis:  [place] Lovenox   Procedures: As Listed in Progress Note Above  Antibiotics: azithro and ceftriaxone as above            Subjective: During exam, patient states "I'm going to kick your ass".  Otherwise denies cp, sob, abd pain.  No vomiting or diarrhea reported.  Pt drowsy, but wakes up and answers questions and follows simple commands  Objective: Vitals:   02/17/19 1939 02/17/19 2146 02/18/19 0448 02/18/19 0833  BP:  (!) 119/96 (!) 152/66   Pulse:  94 78   Resp:  16 16   Temp:  99 F (37.2 C) 98.8 F (37.1 C)   TempSrc:  Oral Oral   SpO2: 95% 91% 91% 92%  Weight:      Height:        Intake/Output Summary (Last 24 hours) at 02/18/2019 1014 Last data filed at 02/18/2019 0500 Gross per 24 hour  Intake --  Output 400 ml  Net -400 ml   Weight change:  Exam:   General:  Pt is alert, follows commands appropriately, not in acute distress  HEENT: No icterus, No thrush, No neck mass, [place]/AT  Cardiovascular: RRR, S1/S2, no rubs, no gallops  Respiratory:  bibasilar crackles. No wheeze  Abdomen: Soft/+BS, non tender, non distended, no guarding  Extremities: trace LE edema, No lymphangitis, No petechiae, No rashes, no synovitis   Data Reviewed: I have personally  reviewed following labs and imaging studies Basic Metabolic Panel: Recent Labs  Lab 02/13/19 0959 02/14/19 0644 02/16/19 0841 02/17/19 0600 02/18/19 0816  NA 137 140 138 138 136  K 2.7* 3.8 3.4* 4.1 3.8  CL 104 104 102 103 103  CO2 25 24 26 25 26   GLUCOSE 190* 129* 116* 114* 114*  BUN 14 16 16 15 13   CREATININE 0.70 0.56 0.57 0.48 0.50  CALCIUM 8.3* 8.6* 8.5* 8.7* 8.4*  MG 1.8  --   --  2.1  --    Liver Function Tests: Recent Labs  Lab 02/18/19 0816  AST 31  ALT 40  ALKPHOS 42  BILITOT 0.8  PROT 6.6  ALBUMIN 2.3*   No results for input(s): LIPASE, AMYLASE in the last 168 hours. Recent Labs  Lab 02/17/19 1659  AMMONIA 21   Coagulation Profile: No results for input(s): INR, PROTIME in the last 168 hours. CBC: Recent Labs  Lab 02/12/19 0704 02/17/19 0600  WBC 5.7 7.6  HGB 12.1 11.2*  HCT 38.1 35.9*  MCV 96.0 97.6  PLT 181 256   Cardiac Enzymes: No results for input(s): CKTOTAL, CKMB, CKMBINDEX, TROPONINI in the last 168 hours. BNP: Invalid input(s): POCBNP CBG: Recent Labs  Lab 02/16/19 1648 02/16/19 2107 02/17/19 0741 02/17/19 1128 02/17/19 1634  GLUCAP 113* 129* 104* 126* 167*   HbA1C: No results for input(s): HGBA1C in the last 72 hours. Urine analysis:    Component Value Date/Time   COLORURINE YELLOW 02/11/2019 0333   APPEARANCEUR HAZY (A) 02/11/2019 0333   LABSPEC 1.029 02/11/2019 0333   PHURINE 5.0 02/11/2019 0333   GLUCOSEU NEGATIVE 02/11/2019 0333   HGBUR SMALL (A) 02/11/2019 0333   BILIRUBINUR NEGATIVE 02/11/2019 0333   KETONESUR NEGATIVE 02/11/2019 0333   PROTEINUR 30 (A) 02/11/2019 0333   UROBILINOGEN 0.2 11/05/2014 2333   NITRITE NEGATIVE 02/11/2019 0333   LEUKOCYTESUR NEGATIVE 02/11/2019 0333   Sepsis Labs: @LABRCNTIP (procalcitonin:4,lacticidven:4) ) Recent Results (from the past 240 hour(s))  Culture, blood (routine x 2)     Status: None   Collection Time: 02/11/19  4:50 AM   Specimen: BLOOD  Result Value Ref Range  Status   Specimen Description BLOOD RIGHT ANTECUBITAL  Final   Special Requests   Final    BOTTLES DRAWN AEROBIC ONLY Blood Culture adequate volume   Culture   Final    NO GROWTH 6 DAYS Performed at [facility], [address] [place], [facility] [address]    Report Status 02/17/2019 FINAL  Final  Culture, blood (routine x 2)     Status: None   Collection Time: 02/11/19  5:34 AM   Specimen: BLOOD  Result Value Ref Range Status   Specimen Description BLOOD BLOOD RIGHT HAND  Final   Special Requests   Final    BOTTLES DRAWN AEROBIC AND ANAEROBIC Blood Culture adequate volume   Culture   Final    NO GROWTH 6 DAYS Performed at [facility], [address] [place], [facility] [address]    Report Status 02/17/2019 FINAL  Final  Respiratory Panel by RT PCR (Flu A&B, Covid) - Nasopharyngeal Swab     Status: None   Collection Time: 02/11/19  5:41 AM   Specimen: Nasopharyngeal Swab  Result Value Ref Range Status   SARS Coronavirus 2 by RT  PCR NEGATIVE NEGATIVE Final    Comment: (NOTE) SARS-CoV-2 target nucleic acids are NOT DETECTED. The SARS-CoV-2 RNA is generally detectable in upper respiratoy specimens during the acute phase of infection. The lowest concentration of SARS-CoV-2 viral copies this assay can detect is 131 copies/mL. A negative result does not preclude SARS-Cov-2 infection and should not be used as the sole basis for treatment or other patient management decisions. A negative result may occur with  improper specimen collection/handling, submission of specimen other than nasopharyngeal swab, presence of viral mutation(s) within the areas targeted by this assay, and inadequate number of viral copies (<131 copies/mL). A negative result must be combined with clinical observations, patient history, and epidemiological information. The expected result is Negative. Fact Sheet for Patients:  PinkCheek.be Fact Sheet for Healthcare Providers:    GravelBags.it This test is not yet ap proved or cleared by the [place] FDA and  has been authorized for detection and/or diagnosis of SARS-CoV-2 by FDA under an Emergency Use Authorization (EUA). This EUA will remain  in effect (meaning this test can be used) for the duration of the COVID-19 declaration under Section 564(b)(1) of the Act, 21 U.S.C. section 360bbb-3(b)(1), unless the authorization is terminated or revoked sooner.    Influenza A by PCR NEGATIVE NEGATIVE Final   Influenza B by PCR NEGATIVE NEGATIVE Final    Comment: (NOTE) The Xpert Xpress SARS-CoV-2/FLU/RSV assay is intended as an aid in  the diagnosis of influenza from Nasopharyngeal swab specimens and  should not be used as a sole basis for treatment. Nasal washings and  aspirates are unacceptable for Xpert Xpress SARS-CoV-2/FLU/RSV  testing. Fact Sheet for Patients: PinkCheek.be Fact Sheet for Healthcare Providers: GravelBags.it This test is not yet approved or cleared by the [place] FDA and  has been authorized for detection and/or diagnosis of SARS-CoV-2 by  FDA under an Emergency Use Authorization (EUA). This EUA will remain  in effect (meaning this test can be used) for the duration of the  Covid-19 declaration under Section 564(b)(1) of the Act, 21  U.S.C. section 360bbb-3(b)(1), unless the authorization is  terminated or revoked. Performed at [facility], [address] [place], [place] [address]   MRSA PCR Screening     Status: None   Collection Time: 02/11/19  9:21 PM   Specimen: Nasal Mucosa; Nasopharyngeal  Result Value Ref Range Status   MRSA by PCR NEGATIVE NEGATIVE Final    Comment:        The GeneXpert MRSA Assay (FDA approved for NASAL specimens only), is one component of a comprehensive MRSA colonization surveillance program. It is not intended to diagnose MRSA infection nor to guide  or monitor treatment for MRSA infections. Performed at [facility], [address], [place], [place] [address]      Scheduled Meds: . acetaminophen  1,000 mg Oral Q8H  . furosemide  40 mg Intravenous Once  . guaiFENesin  600 mg Oral BID  . ipratropium-albuterol  3 mL Nebulization BID  . losartan  100 mg Oral Daily  . pantoprazole  40 mg Oral Q0600   Continuous Infusions:  Procedures/Studies: CT Head Wo Contrast  Result Date: 01/22/2019 CLINICAL DATA:  Headache EXAM: CT HEAD WITHOUT CONTRAST TECHNIQUE: Contiguous axial images were obtained from the base of the skull through the vertex without intravenous contrast. COMPARISON:  12/28/2012 FINDINGS: Brain: There is atrophy and chronic small vessel disease changes. No acute intracranial abnormality. Specifically, no hemorrhage, hydrocephalus, mass lesion, acute infarction, or significant intracranial injury. Vascular:  No hyperdense vessel or unexpected calcification. Skull: No acute calvarial abnormality. Sinuses/Orbits: Visualized paranasal sinuses and mastoids clear. Orbital soft tissues unremarkable. Other: None IMPRESSION: Atrophy, chronic microvascular disease. No acute intracranial abnormality. Electronically Signed   By: Charlett NoseKevin  Dover M.D.   On: 01/22/2019 21:28   CT CHEST WO CONTRAST  Result Date: 02/17/2019 CLINICAL DATA:  Pneumonia, hypoxia EXAM: CT CHEST WITHOUT CONTRAST TECHNIQUE: Multidetector CT imaging of the chest was performed following the standard protocol without IV contrast. COMPARISON:  Chest x-ray 02/11/2019 FINDINGS: Cardiovascular: Cardiomegaly. Tortuous aorta with calcifications. No aneurysm. Scattered coronary artery calcifications. Mediastinum/Nodes: Moderate-sized hiatal hernia. No mediastinal, hilar, or axillary adenopathy. 3.1 cm thyroid isthmic nodule. No follow-up recommended unless clinically warranted (ref: J Am Coll Radiol. 2015 Feb;12(2): 143-50). Lungs/Pleura: Small bilateral pleural effusions with bilateral  lower lobe atelectasis or pneumonia. There are scattered patchy ground-glass airspace opacities in both lungs, most pronounced in the right upper lobe most compatible with pneumonia. Upper Abdomen: Imaging into the upper abdomen shows no acute findings. Musculoskeletal: Near complete collapse of the T11 vertebral body. Moderate compression fracture at T8. These are age indeterminate. Chest wall soft tissues unremarkable. IMPRESSION: Patchy bilateral ground-glass opacities, most pronounced in the right upper lobe most compatible with pneumonia. Small bilateral pleural effusions with bilateral lower lobe atelectasis or pneumonia. Cardiomegaly, coronary artery disease. Moderate-sized hiatal hernia. Aortic Atherosclerosis (ICD10-I70.0). Electronically Signed   By: Charlett NoseKevin  Dover M.D.   On: 02/17/2019 19:55   CT ANGIO CHEST PE W OR WO CONTRAST  Result Date: 02/17/2019 CLINICAL DATA:  Elevated D-dimer.  Pneumonia, hypoxia. EXAM: CT ANGIOGRAPHY CHEST WITH CONTRAST TECHNIQUE: Multidetector CT imaging of the chest was performed using the standard protocol during bolus administration of intravenous contrast. Multiplanar CT image reconstructions and MIPs were obtained to evaluate the vascular anatomy. CONTRAST:  100mL OMNIPAQUE IOHEXOL 350 MG/ML SOLN COMPARISON:  Chest x-ray 02/11/2019.  Noncontrast CT earlier today. FINDINGS: Cardiovascular: Cardiomegaly. Tortuous aorta with calcifications. No aneurysm. No filling defects in the pulmonary arteries to suggest pulmonary emboli. Mediastinum/Nodes: No mediastinal, hilar, or axillary adenopathy. Moderate-sized hiatal hernia. Bilateral thyroid nodules. Large isthmic nodule measures 3.1 cm. Due to advanced age, No follow-up recommended unless clinically warranted (ref: J Am Coll Radiol. 2015 Feb;12(2): 143-50). Lungs/Pleura: Small bilateral pleural effusions. Bilateral lower lobe airspace opacities could reflect atelectasis or pneumonia. Ground-glass airspace opacities in the  lingula, right upper lobe and right middle lobe concerning for pneumonia. Upper Abdomen: Imaging into the upper abdomen shows no acute findings. Musculoskeletal: Chest wall soft tissues are unremarkable. Severe compression deformity at T11 with vertebral plana. Moderate compression fracture at T8 and through the superior endplate of L1. Review of the MIP images confirms the above findings. IMPRESSION: No evidence of pulmonary embolus. Patchy bilateral ground-glass opacities most compatible with pneumonia. Small bilateral effusions with bilateral lower lobe atelectasis or pneumonia. Cardiomegaly, tortuous aorta. Moderate-sized hiatal hernia. Aortic Atherosclerosis (ICD10-I70.0). Electronically Signed   By: Charlett NoseKevin  Dover M.D.   On: 02/17/2019 19:53   CT Cervical Spine Wo Contrast  Result Date: 01/22/2019 CLINICAL DATA:  Unwitnessed fall. EXAM: CT CERVICAL SPINE WITHOUT CONTRAST TECHNIQUE: Multidetector CT imaging of the cervical spine was performed without intravenous contrast. Multiplanar CT image reconstructions were also generated. COMPARISON:  None. FINDINGS: Alignment: No subluxation. Skull base and vertebrae: No acute fracture. No primary bone lesion or focal pathologic process. Soft tissues and spinal canal: No prevertebral fluid or swelling. No visible canal hematoma. Disc levels:  Disc spaces maintained. Upper chest: Scarring in the right  apex. Other: Enlarged multinodular thyroid. 3.3 cm nodule/mass in the isthmus. IMPRESSION: No acute bony abnormality. Multinodular thyroid with 3.3 cm mass in the isthmus, likely multinodular goiter. Given the patient's age, no follow-up recommended unless clinically warranted(ref: J Am Coll Radiol. 2015 Feb;12(2): 143-50). Electronically Signed   By: Charlett Nose M.D.   On: 01/22/2019 21:35   DG Chest Port 1 View  Result Date: 02/11/2019 CLINICAL DATA:  86 year old female with weakness. EXAM: PORTABLE CHEST 1 VIEW COMPARISON:  Chest radiograph dated 14 18. FINDINGS:  Bilateral perihilar and bibasilar streaky densities concerning for developing infiltrate. There is blunting of the costophrenic angles which may represent small pleural effusions. No pneumothorax. Stable cardiac silhouette. Atherosclerotic calcification of the aorta. No acute osseous pathology. IMPRESSION: 1. Bilateral perihilar and bibasilar streaky densities concerning for developing infiltrate. Follow-up recommended. 2. Probable small bilateral pleural effusions. Electronically Signed   By: Elgie Collard M.D.   On: 02/11/2019 02:41   CT Maxillofacial Wo Contrast  Result Date: 01/22/2019 CLINICAL DATA:  Unwitnessed fall. Facial trauma. Laceration to nose. EXAM: CT MAXILLOFACIAL WITHOUT CONTRAST TECHNIQUE: Multidetector CT imaging of the maxillofacial structures was performed. Multiplanar CT image reconstructions were also generated. COMPARISON:  None. FINDINGS: Osseous: No fracture or mandibular dislocation. No destructive process. Orbits: Negative. No traumatic or inflammatory finding. Sinuses: Clear Soft tissues: Negative Limited intracranial: No acute findings. IMPRESSION: No facial or orbital fracture. Electronically Signed   By: Charlett Nose M.D.   On: 01/22/2019 21:32    Catarina Hartshorn, DO  [facility] Pager [phone]  If 7PM-7AM, please contact night-coverage [email or web address] Password [email or web address] 02/18/2019, 10:14 AM   LOS: 7 days

## 2019-02-19 ENCOUNTER — Inpatient Hospital Stay
Admission: RE | Admit: 2019-02-19 | Discharge: 2022-02-25 | Disposition: A | Discharge status: Skilled Nursing Facility | Payer: Medicare Other | Source: Ambulatory Visit | Attending: Internal Medicine | Admitting: Internal Medicine

## 2019-02-19 DIAGNOSIS — H524 Presbyopia: Secondary | ICD-10-CM | POA: Diagnosis not present

## 2019-02-19 DIAGNOSIS — Z0000 Encounter for general adult medical examination without abnormal findings: Secondary | ICD-10-CM | POA: Diagnosis not present

## 2019-02-19 DIAGNOSIS — Z1159 Encounter for screening for other viral diseases: Secondary | ICD-10-CM | POA: Diagnosis not present

## 2019-02-19 DIAGNOSIS — K921 Melena: Secondary | ICD-10-CM | POA: Diagnosis not present

## 2019-02-19 DIAGNOSIS — M6281 Muscle weakness (generalized): Secondary | ICD-10-CM | POA: Diagnosis not present

## 2019-02-19 DIAGNOSIS — R627 Adult failure to thrive: Secondary | ICD-10-CM | POA: Diagnosis not present

## 2019-02-19 DIAGNOSIS — Z9981 Dependence on supplemental oxygen: Secondary | ICD-10-CM | POA: Diagnosis not present

## 2019-02-19 DIAGNOSIS — J9601 Acute respiratory failure with hypoxia: Secondary | ICD-10-CM | POA: Diagnosis not present

## 2019-02-19 DIAGNOSIS — M2041 Other hammer toe(s) (acquired), right foot: Secondary | ICD-10-CM | POA: Diagnosis not present

## 2019-02-19 DIAGNOSIS — M2042 Other hammer toe(s) (acquired), left foot: Secondary | ICD-10-CM | POA: Diagnosis not present

## 2019-02-19 DIAGNOSIS — R633 Feeding difficulties: Secondary | ICD-10-CM | POA: Diagnosis not present

## 2019-02-19 DIAGNOSIS — R279 Unspecified lack of coordination: Secondary | ICD-10-CM | POA: Diagnosis not present

## 2019-02-19 DIAGNOSIS — F0390 Unspecified dementia without behavioral disturbance: Secondary | ICD-10-CM | POA: Diagnosis not present

## 2019-02-19 DIAGNOSIS — G9341 Metabolic encephalopathy: Secondary | ICD-10-CM | POA: Diagnosis not present

## 2019-02-19 DIAGNOSIS — L603 Nail dystrophy: Secondary | ICD-10-CM | POA: Diagnosis not present

## 2019-02-19 DIAGNOSIS — Z993 Dependence on wheelchair: Secondary | ICD-10-CM | POA: Diagnosis not present

## 2019-02-19 DIAGNOSIS — M159 Polyosteoarthritis, unspecified: Secondary | ICD-10-CM | POA: Diagnosis not present

## 2019-02-19 DIAGNOSIS — M8949 Other hypertrophic osteoarthropathy, multiple sites: Secondary | ICD-10-CM | POA: Diagnosis not present

## 2019-02-19 DIAGNOSIS — Z23 Encounter for immunization: Secondary | ICD-10-CM | POA: Diagnosis not present

## 2019-02-19 DIAGNOSIS — R195 Other fecal abnormalities: Secondary | ICD-10-CM | POA: Diagnosis not present

## 2019-02-19 DIAGNOSIS — I739 Peripheral vascular disease, unspecified: Secondary | ICD-10-CM | POA: Diagnosis not present

## 2019-02-19 DIAGNOSIS — M810 Age-related osteoporosis without current pathological fracture: Secondary | ICD-10-CM | POA: Diagnosis not present

## 2019-02-19 DIAGNOSIS — E049 Nontoxic goiter, unspecified: Secondary | ICD-10-CM | POA: Diagnosis not present

## 2019-02-19 DIAGNOSIS — I10 Essential (primary) hypertension: Secondary | ICD-10-CM | POA: Diagnosis not present

## 2019-02-19 DIAGNOSIS — Z741 Need for assistance with personal care: Secondary | ICD-10-CM | POA: Diagnosis not present

## 2019-02-19 DIAGNOSIS — J9611 Chronic respiratory failure with hypoxia: Secondary | ICD-10-CM | POA: Diagnosis not present

## 2019-02-19 DIAGNOSIS — E559 Vitamin D deficiency, unspecified: Secondary | ICD-10-CM | POA: Diagnosis not present

## 2019-02-19 DIAGNOSIS — F411 Generalized anxiety disorder: Secondary | ICD-10-CM | POA: Diagnosis not present

## 2019-02-19 DIAGNOSIS — E876 Hypokalemia: Secondary | ICD-10-CM | POA: Diagnosis not present

## 2019-02-19 DIAGNOSIS — R1310 Dysphagia, unspecified: Secondary | ICD-10-CM | POA: Diagnosis not present

## 2019-02-19 DIAGNOSIS — J189 Pneumonia, unspecified organism: Secondary | ICD-10-CM | POA: Diagnosis not present

## 2019-02-19 DIAGNOSIS — R55 Syncope and collapse: Secondary | ICD-10-CM | POA: Diagnosis not present

## 2019-02-19 DIAGNOSIS — Z7189 Other specified counseling: Secondary | ICD-10-CM | POA: Diagnosis not present

## 2019-02-19 DIAGNOSIS — Z978 Presence of other specified devices: Secondary | ICD-10-CM | POA: Diagnosis not present

## 2019-02-19 DIAGNOSIS — M1990 Unspecified osteoarthritis, unspecified site: Secondary | ICD-10-CM | POA: Diagnosis not present

## 2019-02-19 DIAGNOSIS — E440 Moderate protein-calorie malnutrition: Secondary | ICD-10-CM | POA: Diagnosis not present

## 2019-02-19 DIAGNOSIS — D72829 Elevated white blood cell count, unspecified: Secondary | ICD-10-CM | POA: Diagnosis not present

## 2019-02-19 DIAGNOSIS — R293 Abnormal posture: Secondary | ICD-10-CM | POA: Diagnosis not present

## 2019-02-19 DIAGNOSIS — Z8781 Personal history of (healed) traumatic fracture: Secondary | ICD-10-CM | POA: Diagnosis not present

## 2019-02-19 DIAGNOSIS — H43813 Vitreous degeneration, bilateral: Secondary | ICD-10-CM | POA: Diagnosis not present

## 2019-02-19 DIAGNOSIS — K219 Gastro-esophageal reflux disease without esophagitis: Secondary | ICD-10-CM | POA: Diagnosis not present

## 2019-02-19 DIAGNOSIS — F339 Major depressive disorder, recurrent, unspecified: Secondary | ICD-10-CM | POA: Diagnosis not present

## 2019-02-19 DIAGNOSIS — H2513 Age-related nuclear cataract, bilateral: Secondary | ICD-10-CM | POA: Diagnosis not present

## 2019-02-19 DIAGNOSIS — R262 Difficulty in walking, not elsewhere classified: Secondary | ICD-10-CM | POA: Diagnosis not present

## 2019-02-19 DIAGNOSIS — B351 Tinea unguium: Secondary | ICD-10-CM | POA: Diagnosis not present

## 2019-02-19 LAB — MAGNESIUM: Magnesium: 1.9 mg/dL (ref 1.7–2.4)

## 2019-02-19 LAB — BASIC METABOLIC PANEL
Anion gap: 11 (ref 5–15)
BUN: 16 mg/dL (ref 8–23)
CO2: 25 mmol/L (ref 22–32)
Calcium: 8.4 mg/dL — ABNORMAL LOW (ref 8.9–10.3)
Chloride: 98 mmol/L (ref 98–111)
Creatinine, Ser: 0.63 mg/dL (ref 0.44–1.00)
GFR calc Af Amer: >60 mL/min (ref >60)
GFR calc non Af Amer: >60 mL/min (ref >60)
Glucose, Bld: 115 mg/dL — ABNORMAL HIGH (ref 70–99)
Potassium: 3.6 mmol/L (ref 3.5–5.1)
Sodium: 134 mmol/L — ABNORMAL LOW (ref 135–145)

## 2019-02-19 LAB — CBC
HCT: 34 % — ABNORMAL LOW (ref 36.0–46.0)
Hemoglobin: 11 g/dL — ABNORMAL LOW (ref 12.0–15.0)
MCH: 30.5 pg (ref 26.0–34.0)
MCHC: 32.4 g/dL (ref 30.0–36.0)
MCV: 94.2 fL (ref 80.0–100.0)
Platelets: 313 10*3/uL (ref 150–400)
RBC: 3.61 MIL/uL — ABNORMAL LOW (ref 3.87–5.11)
RDW: 13.2 % (ref 11.5–15.5)
WBC: 8.6 10*3/uL (ref 4.0–10.5)
nRBC: 0 % (ref 0.0–0.2)

## 2019-02-19 LAB — PROCALCITONIN: Procalcitonin: 0.17 ng/mL

## 2019-02-19 MED ORDER — ENSURE ENLIVE PO LIQD: 237.0000 mL | Freq: Three times a day (TID) | ORAL | 12 refills | Status: DC

## 2019-02-19 MED ORDER — LIDOCAINE 5 % EX PTCH: 1.0000 | MEDICATED_PATCH | CUTANEOUS | 0 refills | Status: DC

## 2019-02-19 MED ORDER — FUROSEMIDE 40 MG PO TABS
40.0000 mg | ORAL_TABLET | Freq: Once | ORAL | Status: AC
  Administered 2019-02-19: 40 mg via ORAL
  Filled 2019-02-19: qty 1

## 2019-02-19 NOTE — Progress Notes (Signed)
Discharge orders received for patient to be discharged to [facility]. Report called to [facility], all questions answered and patient report provided. Update given to patient and family at this time and all questions answered. Waiting for patient to be transferred to [facility].

## 2019-02-19 NOTE — Progress Notes (Signed)
On 02/17/19, I spoke with 3 family members on 3 separate occasions  and updated them on the patient's medical condition  On 02/18/19, I again spoke with 3 family members on 3 separate occasions  and updated them on the patient's medical condition.  On 02/19/19, I again spoke with 3 family members on 3 separate occasions  and updated them on the patient's medical condition.  DTat

## 2019-02-19 NOTE — TOC Transition Note (Signed)
Transition of Care [facility]) - CM/SW Discharge Note   Patient Details  Name: [name] MRN: [id number] Date of Birth: 1928/05/03  Transition of Care [facility]) CM/SW Contact:  Leitha Bleak, RN Phone Number: 02/19/2019, 12:55 PM   Clinical Narrative:   [facility] will accept patient today, repeat COVID is negative. MD calling POA. RN calling report. [facility] staff will transport patient.     Final next level of care: Skilled Nursing Facility Barriers to Discharge: Barriers Resolved   Patient Goals and CMS Choice Patient states their goals for this hospitalization and ongoing recovery are:: to go to rehab. CMS Medicare.gov Compare Post Acute Care list provided to:: Patient Represenative (must comment) Choice offered to / list presented to : Adult Children  Discharge Placement              Patient chooses bed at: [facility] Patient to be transferred to facility by: [facility] staff Name of family member notified: Alecia Lemming Patient and family notified of of transfer: 02/19/19  Discharge Plan and Services   Discharge Planning Services: CM Consult Post Acute Care Choice: Nursing Home               Readmission Risk Interventions No flowsheet data found.

## 2019-02-19 NOTE — Discharge Summary (Addendum)
Physician Discharge Summary  LUMEN BRINLEE RUE:[id number] DOB: November 01, 1928 DOA: 02/11/2019  PCP: Carylon Perches, MD  Admit date: 02/11/2019 Discharge date: 02/19/2019  Admitted From: ALF Disposition: SNF  Recommendations for Outpatient Follow-up:  1. Follow up with PCP in 1-2 weeks 2. Please obtain BMP/CBC in one week 3. Wean oxygen to RA for saturation > 92%     Discharge Condition: Stable CODE STATUS: DNR Diet recommendation: Heart Healthy / Carb Modified / Dysphagia / Regular   Brief/Interim Summary: 86 year old female with a history of dementia, hypertension, GERD, osteoporosis presenting from [facility] assisted living with generalized weakness and hypoxia. Apparently, the patient also had increasing confusion. In the emergency department, the patient was afebrile hemodynamically stable but she was hypoxic with oxygenation of 86% on room air. Chest x-ray at that time showed bilateral perihilar and bibasilar densities concerning for pneumonia. The patient was started on ceftriaxone and azithromycin. Unfortunately, the patient continues to remain hypoxic during the hospitalization despite treatment for her pneumonia. Although her confusion is somewhat better, she remains confused with episodes of agitation at nighttime. Palliative medicine was consulted to assist with management. Goals of care discussion were undertaken. The patient's CODE STATUS was changed to DNR. The patient finished 7 days of IV abx.  She remained off abx x >3 days and remained clinically and actually continued to improve clinically off abx.  She was treated with IV lasix x 1 for fluid overload with improving oxgenation.  The patient's family, particularly POA was updated on a daily basis during my care.  Discharge Diagnoses:  Acute respiratory failure with hypoxia -secondary to pneumonia and pleural effusion -Patient remains hypoxic on 5 L nasal cannula with saturation 92-93% -she needs to increase activity and  use flutter valve and incentive spirometry as I feel part of her hypoxia at the time of d/c is due to hypoventilation and atelectatic disease -Azithromycin 02/11/2019>>>02/14/2019 -Ceftriaxone 02/11/2019>>>02/17/2019 -CTA chest--no PE;  Bilateral patchy GGO;  Small bilateral pleural effusions -D-dimer--1.51 -lasix 40 mg IV x 1 and lasix 40 mg po x 1 given -Echo--EF 70-75%, no significant valvular abnormalities -PCT <0.10->>>0.17  Acute metabolic encephalopathy -Multifactorial including infectious process, hypoxia, and possible progression of her underlying dementia -Serum B12--251 -TSH--0.369 -Folic acid--7.3 -Ammonia--21 -02/11/2019 UA without pyuria -02/19/19--pt was very alert, conversant and followed commands appropriately--son at bedside felt her mental status is nearing baseline  Essential hypertension -Continue losartan -d/c amlodipine as BP remains well controlled with losartan  Dementia -Patient previously on Aricept  Goals of care discussion -discussed with son at bedside 02/17/19 -Advance care planning, including the explanation and discussion of advance directives was carried out with the patient and family. Code status including explanations of "Full Code" and "DNR" and alternatives were discussed in detail. Discussion of end-of-life issues including but not limited palliative care, hospice care and the concept of hospice, other end-of-life care options, power of attorney for health care decisions, living wills, and physician orders for life-sustaining treatment were also discussed with the patient and family. Total face to face time . -confirmed DNR -palliative medicine following     Discharge Instructions   Allergies as of 02/19/2019      Reactions   Hydrocodone-acetaminophen Nausea And Vomiting      Medication List    STOP taking these medications   amLODipine 5 MG tablet Commonly known as: NORVASC   hydrochlorothiazide 12.5 MG  capsule Commonly known as: MICROZIDE     TAKE these medications   acetaminophen 500 MG tablet Commonly known as:  TYLENOL Take 1,000 mg by mouth 3 (three) times daily. For pain   donepezil 10 MG tablet Commonly known as: ARICEPT Take 10 mg by mouth daily.   escitalopram 10 MG tablet Commonly known as: LEXAPRO Take 10 mg by mouth daily.   esomeprazole 40 MG capsule Commonly known as: NEXIUM Take 40 mg by mouth every morning.   feeding supplement (ENSURE ENLIVE) Liqd Take 237 mLs by mouth 3 (three) times daily between meals.   lidocaine 5 % Commonly known as: LIDODERM Place 1 patch onto the skin daily. Remove & Discard patch within 12 hours or as directed by MD Start taking on: February 20, 2019   loperamide 2 MG capsule Commonly known as: IMODIUM Take 2 mg by mouth every 4 (four) hours as needed for diarrhea or loose stools.   losartan 100 MG tablet Commonly known as: COZAAR Take 100 mg by mouth daily.   Tussin DM 10-100 MG/5ML liquid Generic drug: dextromethorphan-guaiFENesin Take 10 mLs by mouth every 6 (six) hours as needed for cough.      Contact information for after-discharge care    Destination    [facility] Preferred SNF .   Service: Skilled Nursing Contact information: [address] [place] [address] [phone]             Allergies  Allergen Reactions  . Hydrocodone-Acetaminophen Nausea And Vomiting    Consultations:  palliative   Procedures/Studies: CT Head Wo Contrast  Result Date: 01/22/2019 CLINICAL DATA:  Headache EXAM: CT HEAD WITHOUT CONTRAST TECHNIQUE: Contiguous axial images were obtained from the base of the skull through the vertex without intravenous contrast. COMPARISON:  12/28/2012 FINDINGS: Brain: There is atrophy and chronic small vessel disease changes. No acute intracranial abnormality. Specifically, no hemorrhage, hydrocephalus, mass lesion, acute infarction, or significant  intracranial injury. Vascular: No hyperdense vessel or unexpected calcification. Skull: No acute calvarial abnormality. Sinuses/Orbits: Visualized paranasal sinuses and mastoids clear. Orbital soft tissues unremarkable. Other: None IMPRESSION: Atrophy, chronic microvascular disease. No acute intracranial abnormality. Electronically Signed   By: Charlett Nose M.D.   On: 01/22/2019 21:28   CT CHEST WO CONTRAST  Result Date: 02/17/2019 CLINICAL DATA:  Pneumonia, hypoxia EXAM: CT CHEST WITHOUT CONTRAST TECHNIQUE: Multidetector CT imaging of the chest was performed following the standard protocol without IV contrast. COMPARISON:  Chest x-ray 02/11/2019 FINDINGS: Cardiovascular: Cardiomegaly. Tortuous aorta with calcifications. No aneurysm. Scattered coronary artery calcifications. Mediastinum/Nodes: Moderate-sized hiatal hernia. No mediastinal, hilar, or axillary adenopathy. 3.1 cm thyroid isthmic nodule. No follow-up recommended unless clinically warranted (ref: J Am Coll Radiol. 2015 Feb;12(2): 143-50). Lungs/Pleura: Small bilateral pleural effusions with bilateral lower lobe atelectasis or pneumonia. There are scattered patchy ground-glass airspace opacities in both lungs, most pronounced in the right upper lobe most compatible with pneumonia. Upper Abdomen: Imaging into the upper abdomen shows no acute findings. Musculoskeletal: Near complete collapse of the T11 vertebral body. Moderate compression fracture at T8. These are age indeterminate. Chest wall soft tissues unremarkable. IMPRESSION: Patchy bilateral ground-glass opacities, most pronounced in the right upper lobe most compatible with pneumonia. Small bilateral pleural effusions with bilateral lower lobe atelectasis or pneumonia. Cardiomegaly, coronary artery disease. Moderate-sized hiatal hernia. Aortic Atherosclerosis (ICD10-I70.0). Electronically Signed   By: Charlett Nose M.D.   On: 02/17/2019 19:55   CT ANGIO CHEST PE W OR WO CONTRAST  Result Date:  02/17/2019 CLINICAL DATA:  Elevated D-dimer.  Pneumonia, hypoxia. EXAM: CT ANGIOGRAPHY CHEST WITH CONTRAST TECHNIQUE: Multidetector CT imaging of the chest was  performed using the standard protocol during bolus administration of intravenous contrast. Multiplanar CT image reconstructions and MIPs were obtained to evaluate the vascular anatomy. CONTRAST:  OMNIPAQUE IOHEXOL 350 MG/ML SOLN COMPARISON:  Chest x-ray 02/11/2019.  Noncontrast CT earlier today. FINDINGS: Cardiovascular: Cardiomegaly. Tortuous aorta with calcifications. No aneurysm. No filling defects in the pulmonary arteries to suggest pulmonary emboli. Mediastinum/Nodes: No mediastinal, hilar, or axillary adenopathy. Moderate-sized hiatal hernia. Bilateral thyroid nodules. Large isthmic nodule measures 3.1 cm. Due to advanced age, No follow-up recommended unless clinically warranted (ref: J Am Coll Radiol. 2015 Feb;12(2): 143-50). Lungs/Pleura: Small bilateral pleural effusions. Bilateral lower lobe airspace opacities could reflect atelectasis or pneumonia. Ground-glass airspace opacities in the lingula, right upper lobe and right middle lobe concerning for pneumonia. Upper Abdomen: Imaging into the upper abdomen shows no acute findings. Musculoskeletal: Chest wall soft tissues are unremarkable. Severe compression deformity at T11 with vertebral plana. Moderate compression fracture at T8 and through the superior endplate of L1. Review of the MIP images confirms the above findings. IMPRESSION: No evidence of pulmonary embolus. Patchy bilateral ground-glass opacities most compatible with pneumonia. Small bilateral effusions with bilateral lower lobe atelectasis or pneumonia. Cardiomegaly, tortuous aorta. Moderate-sized hiatal hernia. Aortic Atherosclerosis (ICD10-I70.0). Electronically Signed   By: Charlett Nose M.D.   On: 02/17/2019 19:53   CT Cervical Spine Wo Contrast  Result Date: 01/22/2019 CLINICAL DATA:  Unwitnessed fall. EXAM: CT CERVICAL  SPINE WITHOUT CONTRAST TECHNIQUE: Multidetector CT imaging of the cervical spine was performed without intravenous contrast. Multiplanar CT image reconstructions were also generated. COMPARISON:  None. FINDINGS: Alignment: No subluxation. Skull base and vertebrae: No acute fracture. No primary bone lesion or focal pathologic process. Soft tissues and spinal canal: No prevertebral fluid or swelling. No visible canal hematoma. Disc levels:  Disc spaces maintained. Upper chest: Scarring in the right apex. Other: Enlarged multinodular thyroid. 3.3 cm nodule/mass in the isthmus. IMPRESSION: No acute bony abnormality. Multinodular thyroid with 3.3 cm mass in the isthmus, likely multinodular goiter. Given the patient's age, no follow-up recommended unless clinically warranted(ref: J Am Coll Radiol. 2015 Feb;12(2): 143-50). Electronically Signed   By: Charlett Nose M.D.   On: 01/22/2019 21:35   DG Chest Port 1 View  Result Date: 02/11/2019 CLINICAL DATA:  86 year old female with weakness. EXAM: PORTABLE CHEST 1 VIEW COMPARISON:  Chest radiograph dated 14 18. FINDINGS: Bilateral perihilar and bibasilar streaky densities concerning for developing infiltrate. There is blunting of the costophrenic angles which may represent small pleural effusions. No pneumothorax. Stable cardiac silhouette. Atherosclerotic calcification of the aorta. No acute osseous pathology. IMPRESSION: 1. Bilateral perihilar and bibasilar streaky densities concerning for developing infiltrate. Follow-up recommended. 2. Probable small bilateral pleural effusions. Electronically Signed   By: Elgie Collard M.D.   On: 02/11/2019 02:41   ECHOCARDIOGRAM COMPLETE  Result Date: 02/18/2019   ECHOCARDIOGRAM REPORT   Patient Name:   HUMAIRA SCULLEY Date of Exam: 02/18/2019 Medical Rec #:  [id number]     Height:       63.0 in Accession #:    [id number]    Weight:       125.0 lb Date of Birth:  April 07, 1928    BSA:          1.58 m Patient Age:    86 years      BP:            115/65 mmHg Patient Gender: F             HR:  78 bpm. Exam Location:  Inpatient Procedure: 2D Echo Indications:    dyspnea 786.09  History:        Patient has no prior history of Echocardiogram examinations.                 Risk Factors:Hypertension.  Sonographer:    Celene SkeenVijay Shankar RDCS (AE) Referring Phys: [phone] Charma Mocarski  Sonographer Comments: No apical window. challenging study (please see comments) IMPRESSIONS  1. Left ventricular ejection fraction, by visual estimation, is 70 to 75%. The left ventricle has hyperdynamic function. There is no left ventricular hypertrophy.  2. Left ventricular diastolic parameters are indeterminate.  3. The left ventricle has no regional wall motion abnormalities.  4. Global right ventricle was not well visualized.The right ventricular size is not well visualized. Right vetricular wall thickness was not assessed.  5. Left atrial size was not well visualized.  6. Right atrial size was not well visualized.  7. The mitral valve was not well visualized. No evidence of mitral valve regurgitation. No evidence of mitral stenosis.  8. The tricuspid valve is not well visualized.  9. The aortic valve was not well visualized. Aortic valve regurgitation is not visualized. Technically difficult assement, grossly there is no significant aortic stenosis. 10. The pulmonic valve was not well visualized. Pulmonic valve regurgitation is not visualized. 11. The interatrial septum was not well visualized. 12. Technically difficult and limited study FINDINGS  Left Ventricle: Left ventricular ejection fraction, by visual estimation, is 70 to 75%. The left ventricle has hyperdynamic function. The left ventricle has no regional wall motion abnormalities. There is no left ventricular hypertrophy. Left ventricular diastolic parameters are indeterminate. Right Ventricle: The right ventricular size is not well visualized. Right vetricular wall thickness was not assessed. Global RV systolic  function is was not well visualized. Left Atrium: Left atrial size was not well visualized. Right Atrium: Right atrial size was not well visualized Pericardium: There is no evidence of pericardial effusion. Mitral Valve: The mitral valve was not well visualized. No evidence of mitral valve regurgitation. No evidence of mitral valve stenosis by observation. Tricuspid Valve: The tricuspid valve is not well visualized. Tricuspid valve regurgitation unable to assess TR. Aortic Valve: The aortic valve was not well visualized. . There is mild thickening and mild calcification of the aortic valve. Aortic valve regurgitation is not visualized. Technically difficult assement, grossly there is no significant aortic stenosis. Mild aortic valve annular calcification. There is mild thickening of the aortic valve. There is mild calcification of the aortic valve. Pulmonic Valve: The pulmonic valve was not well visualized. Pulmonic valve regurgitation is not visualized. Pulmonic regurgitation is not visualized. No evidence of pulmonic stenosis. Aorta: The aortic root is normal in size and structure. IAS/Shunts: The interatrial septum was not well visualized.  LEFT VENTRICLE PLAX 2D LVIDd:         4.68 cm LVIDs:         2.63 cm LV PW:         1.00 cm LV IVS:        0.92 cm LVOT diam:     1.90 cm LV SV:         76 ml LV SV Index:   47.78 LVOT Area:     2.84 cm  LEFT ATRIUM         Index LA diam:    3.80 cm 2.40 cm/m  AORTIC VALVE LVOT Vmax:   66.50 cm/s LVOT Vmean:  53.500 cm/s LVOT VTI:  0.124 m  AORTA Ao Root diam: 3.30 cm  SHUNTS Systemic VTI:  0.12 m Systemic Diam: 1.90 cm  Dina Rich MD Electronically signed by Dina Rich MD Signature Date/Time: 02/18/2019/5:14:04 PM    Final    CT Maxillofacial Wo Contrast  Result Date: 01/22/2019 CLINICAL DATA:  Unwitnessed fall. Facial trauma. Laceration to nose. EXAM: CT MAXILLOFACIAL WITHOUT CONTRAST TECHNIQUE: Multidetector CT imaging of the maxillofacial structures was  performed. Multiplanar CT image reconstructions were also generated. COMPARISON:  None. FINDINGS: Osseous: No fracture or mandibular dislocation. No destructive process. Orbits: Negative. No traumatic or inflammatory finding. Sinuses: Clear Soft tissues: Negative Limited intracranial: No acute findings. IMPRESSION: No facial or orbital fracture. Electronically Signed   By: Charlett Nose M.D.   On: 01/22/2019 21:32         Discharge Exam: Vitals:   02/19/19 0627 02/19/19 0809  BP: 138/76   Pulse: 97   Resp: 20   Temp: 98 F (36.7 C)   SpO2: 91% 92%   Vitals:   02/18/19 2006 02/18/19 2126 02/19/19 0627 02/19/19 0809  BP:  131/66 138/76   Pulse:  (!) 102 97   Resp:  20 20   Temp:  98 F (36.7 C) 98 F (36.7 C)   TempSrc:  Oral    SpO2: 93% 94% 91% 92%  Weight:      Height:        General: Pt is alert, awake, not in acute distress Cardiovascular: RRR, S1/S2 +, no rubs, no gallops Respiratory: bibasilar rales, no wheeze Abdominal: Soft, NT, ND, bowel sounds + Extremities: no edema, no cyanosis   The results of significant diagnostics from this hospitalization (including imaging, microbiology, ancillary and laboratory) are listed below for reference.    Significant Diagnostic Studies: CT Head Wo Contrast  Result Date: 01/22/2019 CLINICAL DATA:  Headache EXAM: CT HEAD WITHOUT CONTRAST TECHNIQUE: Contiguous axial images were obtained from the base of the skull through the vertex without intravenous contrast. COMPARISON:  12/28/2012 FINDINGS: Brain: There is atrophy and chronic small vessel disease changes. No acute intracranial abnormality. Specifically, no hemorrhage, hydrocephalus, mass lesion, acute infarction, or significant intracranial injury. Vascular: No hyperdense vessel or unexpected calcification. Skull: No acute calvarial abnormality. Sinuses/Orbits: Visualized paranasal sinuses and mastoids clear. Orbital soft tissues unremarkable. Other: None IMPRESSION: Atrophy,  chronic microvascular disease. No acute intracranial abnormality. Electronically Signed   By: Charlett Nose M.D.   On: 01/22/2019 21:28   CT CHEST WO CONTRAST  Result Date: 02/17/2019 CLINICAL DATA:  Pneumonia, hypoxia EXAM: CT CHEST WITHOUT CONTRAST TECHNIQUE: Multidetector CT imaging of the chest was performed following the standard protocol without IV contrast. COMPARISON:  Chest x-ray 02/11/2019 FINDINGS: Cardiovascular: Cardiomegaly. Tortuous aorta with calcifications. No aneurysm. Scattered coronary artery calcifications. Mediastinum/Nodes: Moderate-sized hiatal hernia. No mediastinal, hilar, or axillary adenopathy. 3.1 cm thyroid isthmic nodule. No follow-up recommended unless clinically warranted (ref: J Am Coll Radiol. 2015 Feb;12(2): 143-50). Lungs/Pleura: Small bilateral pleural effusions with bilateral lower lobe atelectasis or pneumonia. There are scattered patchy ground-glass airspace opacities in both lungs, most pronounced in the right upper lobe most compatible with pneumonia. Upper Abdomen: Imaging into the upper abdomen shows no acute findings. Musculoskeletal: Near complete collapse of the T11 vertebral body. Moderate compression fracture at T8. These are age indeterminate. Chest wall soft tissues unremarkable. IMPRESSION: Patchy bilateral ground-glass opacities, most pronounced in the right upper lobe most compatible with pneumonia. Small bilateral pleural effusions with bilateral lower lobe atelectasis or pneumonia. Cardiomegaly, coronary artery disease. Moderate-sized  hiatal hernia. Aortic Atherosclerosis (ICD10-I70.0). Electronically Signed   By: Charlett Nose M.D.   On: 02/17/2019 19:55   CT ANGIO CHEST PE W OR WO CONTRAST  Result Date: 02/17/2019 CLINICAL DATA:  Elevated D-dimer.  Pneumonia, hypoxia. EXAM: CT ANGIOGRAPHY CHEST WITH CONTRAST TECHNIQUE: Multidetector CT imaging of the chest was performed using the standard protocol during bolus administration of intravenous contrast.  Multiplanar CT image reconstructions and MIPs were obtained to evaluate the vascular anatomy. CONTRAST:  OMNIPAQUE IOHEXOL 350 MG/ML SOLN COMPARISON:  Chest x-ray 02/11/2019.  Noncontrast CT earlier today. FINDINGS: Cardiovascular: Cardiomegaly. Tortuous aorta with calcifications. No aneurysm. No filling defects in the pulmonary arteries to suggest pulmonary emboli. Mediastinum/Nodes: No mediastinal, hilar, or axillary adenopathy. Moderate-sized hiatal hernia. Bilateral thyroid nodules. Large isthmic nodule measures 3.1 cm. Due to advanced age, No follow-up recommended unless clinically warranted (ref: J Am Coll Radiol. 2015 Feb;12(2): 143-50). Lungs/Pleura: Small bilateral pleural effusions. Bilateral lower lobe airspace opacities could reflect atelectasis or pneumonia. Ground-glass airspace opacities in the lingula, right upper lobe and right middle lobe concerning for pneumonia. Upper Abdomen: Imaging into the upper abdomen shows no acute findings. Musculoskeletal: Chest wall soft tissues are unremarkable. Severe compression deformity at T11 with vertebral plana. Moderate compression fracture at T8 and through the superior endplate of L1. Review of the MIP images confirms the above findings. IMPRESSION: No evidence of pulmonary embolus. Patchy bilateral ground-glass opacities most compatible with pneumonia. Small bilateral effusions with bilateral lower lobe atelectasis or pneumonia. Cardiomegaly, tortuous aorta. Moderate-sized hiatal hernia. Aortic Atherosclerosis (ICD10-I70.0). Electronically Signed   By: Charlett Nose M.D.   On: 02/17/2019 19:53   CT Cervical Spine Wo Contrast  Result Date: 01/22/2019 CLINICAL DATA:  Unwitnessed fall. EXAM: CT CERVICAL SPINE WITHOUT CONTRAST TECHNIQUE: Multidetector CT imaging of the cervical spine was performed without intravenous contrast. Multiplanar CT image reconstructions were also generated. COMPARISON:  None. FINDINGS: Alignment: No subluxation. Skull base  and vertebrae: No acute fracture. No primary bone lesion or focal pathologic process. Soft tissues and spinal canal: No prevertebral fluid or swelling. No visible canal hematoma. Disc levels:  Disc spaces maintained. Upper chest: Scarring in the right apex. Other: Enlarged multinodular thyroid. 3.3 cm nodule/mass in the isthmus. IMPRESSION: No acute bony abnormality. Multinodular thyroid with 3.3 cm mass in the isthmus, likely multinodular goiter. Given the patient's age, no follow-up recommended unless clinically warranted(ref: J Am Coll Radiol. 2015 Feb;12(2): 143-50). Electronically Signed   By: Charlett Nose M.D.   On: 01/22/2019 21:35   DG Chest Port 1 View  Result Date: 02/11/2019 CLINICAL DATA:  86 year old female with weakness. EXAM: PORTABLE CHEST 1 VIEW COMPARISON:  Chest radiograph dated 14 18. FINDINGS: Bilateral perihilar and bibasilar streaky densities concerning for developing infiltrate. There is blunting of the costophrenic angles which may represent small pleural effusions. No pneumothorax. Stable cardiac silhouette. Atherosclerotic calcification of the aorta. No acute osseous pathology. IMPRESSION: 1. Bilateral perihilar and bibasilar streaky densities concerning for developing infiltrate. Follow-up recommended. 2. Probable small bilateral pleural effusions. Electronically Signed   By: Elgie Collard M.D.   On: 02/11/2019 02:41   ECHOCARDIOGRAM COMPLETE  Result Date: 02/18/2019   ECHOCARDIOGRAM REPORT   Patient Name:   KAIANA MARION Date of Exam: 02/18/2019 Medical Rec #:  [id number]     Height:       63.0 in Accession #:    [id number]    Weight:       125.0 lb Date of Birth:  January 24, 1929  BSA:          1.58 m Patient Age:    86 years      BP:           115/65 mmHg Patient Gender: F             HR:           78 bpm. Exam Location:  Inpatient Procedure: 2D Echo Indications:    dyspnea 786.09  History:        Patient has no prior history of Echocardiogram examinations.                 Risk  Factors:Hypertension.  Sonographer:    Celene Skeen RDCS (AE) Referring Phys: [id number] Madi Bonfiglio  Sonographer Comments: No apical window. challenging study (please see comments) IMPRESSIONS  1. Left ventricular ejection fraction, by visual estimation, is 70 to 75%. The left ventricle has hyperdynamic function. There is no left ventricular hypertrophy.  2. Left ventricular diastolic parameters are indeterminate.  3. The left ventricle has no regional wall motion abnormalities.  4. Global right ventricle was not well visualized.The right ventricular size is not well visualized. Right vetricular wall thickness was not assessed.  5. Left atrial size was not well visualized.  6. Right atrial size was not well visualized.  7. The mitral valve was not well visualized. No evidence of mitral valve regurgitation. No evidence of mitral stenosis.  8. The tricuspid valve is not well visualized.  9. The aortic valve was not well visualized. Aortic valve regurgitation is not visualized. Technically difficult assement, grossly there is no significant aortic stenosis. 10. The pulmonic valve was not well visualized. Pulmonic valve regurgitation is not visualized. 11. The interatrial septum was not well visualized. 12. Technically difficult and limited study FINDINGS  Left Ventricle: Left ventricular ejection fraction, by visual estimation, is 70 to 75%. The left ventricle has hyperdynamic function. The left ventricle has no regional wall motion abnormalities. There is no left ventricular hypertrophy. Left ventricular diastolic parameters are indeterminate. Right Ventricle: The right ventricular size is not well visualized. Right vetricular wall thickness was not assessed. Global RV systolic function is was not well visualized. Left Atrium: Left atrial size was not well visualized. Right Atrium: Right atrial size was not well visualized Pericardium: There is no evidence of pericardial effusion. Mitral Valve: The mitral valve was not well  visualized. No evidence of mitral valve regurgitation. No evidence of mitral valve stenosis by observation. Tricuspid Valve: The tricuspid valve is not well visualized. Tricuspid valve regurgitation unable to assess TR. Aortic Valve: The aortic valve was not well visualized. . There is mild thickening and mild calcification of the aortic valve. Aortic valve regurgitation is not visualized. Technically difficult assement, grossly there is no significant aortic stenosis. Mild aortic valve annular calcification. There is mild thickening of the aortic valve. There is mild calcification of the aortic valve. Pulmonic Valve: The pulmonic valve was not well visualized. Pulmonic valve regurgitation is not visualized. Pulmonic regurgitation is not visualized. No evidence of pulmonic stenosis. Aorta: The aortic root is normal in size and structure. IAS/Shunts: The interatrial septum was not well visualized.  LEFT VENTRICLE PLAX 2D LVIDd:         4.68 cm LVIDs:         2.63 cm LV PW:         1.00 cm LV IVS:        0.92 cm LVOT diam:     1.90 cm  LV SV:         76 ml LV SV Index:   47.78 LVOT Area:     2.84 cm  LEFT ATRIUM         Index LA diam:    3.80 cm 2.40 cm/m  AORTIC VALVE LVOT Vmax:   66.50 cm/s LVOT Vmean:  53.500 cm/s LVOT VTI:    0.124 m  AORTA Ao Root diam: 3.30 cm  SHUNTS Systemic VTI:  0.12 m Systemic Diam: 1.90 cm  Dina RichJonathan Branch MD Electronically signed by Dina RichJonathan Branch MD Signature Date/Time: 02/18/2019/5:14:04 PM    Final    CT Maxillofacial Wo Contrast  Result Date: 01/22/2019 CLINICAL DATA:  Unwitnessed fall. Facial trauma. Laceration to nose. EXAM: CT MAXILLOFACIAL WITHOUT CONTRAST TECHNIQUE: Multidetector CT imaging of the maxillofacial structures was performed. Multiplanar CT image reconstructions were also generated. COMPARISON:  None. FINDINGS: Osseous: No fracture or mandibular dislocation. No destructive process. Orbits: Negative. No traumatic or inflammatory finding. Sinuses: Clear Soft  tissues: Negative Limited intracranial: No acute findings. IMPRESSION: No facial or orbital fracture. Electronically Signed   By: Charlett NoseKevin  Dover M.D.   On: 01/22/2019 21:32     Microbiology: Recent Results (from the past 240 hour(s))  Culture, blood (routine x 2)     Status: None   Collection Time: 02/11/19  4:50 AM   Specimen: BLOOD  Result Value Ref Range Status   Specimen Description BLOOD RIGHT ANTECUBITAL  Final   Special Requests   Final    BOTTLES DRAWN AEROBIC ONLY Blood Culture adequate volume   Culture   Final    NO GROWTH 6 DAYS Performed at [facility], [address] [place], [place] [address]    Report Status 02/17/2019 FINAL  Final  Culture, blood (routine x 2)     Status: None   Collection Time: 02/11/19  5:34 AM   Specimen: BLOOD  Result Value Ref Range Status   Specimen Description BLOOD BLOOD RIGHT HAND  Final   Special Requests   Final    BOTTLES DRAWN AEROBIC AND ANAEROBIC Blood Culture adequate volume   Culture   Final    NO GROWTH 6 DAYS Performed at [facility], [address] [place], [place] [address]    Report Status 02/17/2019 FINAL  Final  Respiratory Panel by RT PCR (Flu A&B, Covid) - Nasopharyngeal Swab     Status: None   Collection Time: 02/11/19  5:41 AM   Specimen: Nasopharyngeal Swab  Result Value Ref Range Status   SARS Coronavirus 2 by RT PCR NEGATIVE NEGATIVE Final    Comment: (NOTE) SARS-CoV-2 target nucleic acids are NOT DETECTED. The SARS-CoV-2 RNA is generally detectable in upper respiratoy specimens during the acute phase of infection. The lowest concentration of SARS-CoV-2 viral copies this assay can detect is 131 copies/mL. A negative result does not preclude SARS-Cov-2 infection and should not be used as the sole basis for treatment or other patient management decisions. A negative result may occur with  improper specimen collection/handling, submission of specimen other than nasopharyngeal swab, presence of viral mutation(s)  within the areas targeted by this assay, and inadequate number of viral copies (<131 copies/mL). A negative result must be combined with clinical observations, patient history, and epidemiological information. The expected result is Negative. Fact Sheet for Patients:  https://www.moore.com/https://www.fda.gov/media/142436/download Fact Sheet for Healthcare Providers:  https://www.young.biz/https://www.fda.gov/media/142435/download This test is not yet ap proved or cleared by the [place] FDA and  has been authorized for detection and/or diagnosis of SARS-CoV-2  by FDA under an Emergency Use Authorization (EUA). This EUA will remain  in effect (meaning this test can be used) for the duration of the COVID-19 declaration under Section 564(b)(1) of the Act, 21 U.S.C. section 360bbb-3(b)(1), unless the authorization is terminated or revoked sooner.    Influenza A by PCR NEGATIVE NEGATIVE Final   Influenza B by PCR NEGATIVE NEGATIVE Final    Comment: (NOTE) The Xpert Xpress SARS-CoV-2/FLU/RSV assay is intended as an aid in  the diagnosis of influenza from Nasopharyngeal swab specimens and  should not be used as a sole basis for treatment. Nasal washings and  aspirates are unacceptable for Xpert Xpress SARS-CoV-2/FLU/RSV  testing. Fact Sheet for Patients: https://www.moore.com/ Fact Sheet for Healthcare Providers: https://www.young.biz/ This test is not yet approved or cleared by the [place] FDA and  has been authorized for detection and/or diagnosis of SARS-CoV-2 by  FDA under an Emergency Use Authorization (EUA). This EUA will remain  in effect (meaning this test can be used) for the duration of the  Covid-19 declaration under Section 564(b)(1) of the Act, 21  U.S.C. section 360bbb-3(b)(1), unless the authorization is  terminated or revoked. Performed at [facility], [address], [place], [place] [address]   MRSA PCR Screening     Status: None   Collection Time: 02/11/19   9:21 PM   Specimen: Nasal Mucosa; Nasopharyngeal  Result Value Ref Range Status   MRSA by PCR NEGATIVE NEGATIVE Final    Comment:        The GeneXpert MRSA Assay (FDA approved for NASAL specimens only), is one component of a comprehensive MRSA colonization surveillance program. It is not intended to diagnose MRSA infection nor to guide or monitor treatment for MRSA infections. Performed at [facility], [address] [place], [place] [address]   SARS CORONAVIRUS 2 (Jessen Siegman 6-24 HRS) Nasopharyngeal Nasopharyngeal Swab     Status: None   Collection Time: 02/18/19 11:00 AM   Specimen: Nasopharyngeal Swab  Result Value Ref Range Status   SARS Coronavirus 2 NEGATIVE NEGATIVE Final    Comment: (NOTE) SARS-CoV-2 target nucleic acids are NOT DETECTED. The SARS-CoV-2 RNA is generally detectable in upper and lower respiratory specimens during the acute phase of infection. Negative results do not preclude SARS-CoV-2 infection, do not rule out co-infections with other pathogens, and should not be used as the sole basis for treatment or other patient management decisions. Negative results must be combined with clinical observations, patient history, and epidemiological information. The expected result is Negative. Fact Sheet for Patients: HairSlick.no Fact Sheet for Healthcare Providers: quierodirigir.com This test is not yet approved or cleared by the [place] FDA and  has been authorized for detection and/or diagnosis of SARS-CoV-2 by FDA under an Emergency Use Authorization (EUA). This EUA will remain  in effect (meaning this test can be used) for the duration of the COVID-19 declaration under Section 56 4(b)(1) of the Act, 21 U.S.C. section 360bbb-3(b)(1), unless the authorization is terminated or revoked sooner. Performed at [facility], [address], [place], [place] [address]      Labs: Basic Metabolic  Panel: Recent Labs  Lab 02/13/19 [phone] 02/13/19 0959 02/14/19 0644 02/14/19 0644 02/16/19 0841 02/16/19 0841 02/17/19 0600 02/17/19 0600 02/18/19 0816 02/19/19 0651  NA 137   < > 140  --  138  --  138  --  136 134*  K 2.7*   < > 3.8   < > 3.4*   < > 4.1   < >  3.8 3.6  CL 104   < > 104  --  102  --  103  --  103 98  CO2 25   < > 24  --  26  --  25  --  26 25  GLUCOSE 190*   < > 129*  --  116*  --  114*  --  114* 115*  BUN 14   < > 16  --  16  --  15  --  13 16  CREATININE 0.70   < > 0.56  --  0.57  --  0.48  --  0.50 0.63  CALCIUM 8.3*   < > 8.6*  --  8.5*  --  8.7*  --  8.4* 8.4*  MG 1.8  --   --   --   --   --  2.1  --   --  1.9   < > = values in this interval not displayed.   Liver Function Tests: Recent Labs  Lab 02/18/19 0816  AST 31  ALT 40  ALKPHOS 42  BILITOT 0.8  PROT 6.6  ALBUMIN 2.3*   No results for input(s): LIPASE, AMYLASE in the last 168 hours. Recent Labs  Lab 02/17/19 1659  AMMONIA 21   CBC: Recent Labs  Lab 02/17/19 0600 02/19/19 0651  WBC 7.6 8.6  HGB 11.2* 11.0*  HCT 35.9* 34.0*  MCV 97.6 94.2  PLT 256 313   Cardiac Enzymes: No results for input(s): CKTOTAL, CKMB, CKMBINDEX, TROPONINI in the last 168 hours. BNP: Invalid input(s): POCBNP CBG: Recent Labs  Lab 02/16/19 2107 02/17/19 0741 02/17/19 1128 02/17/19 1634 02/18/19 1627  GLUCAP 129* 104* 126* 167* 148*    Time coordinating discharge:  36 minutes  Signed:  Orson Eva, DO [facility] Pager: [phone] 02/19/2019, 11:46 AM

## 2019-02-19 NOTE — Care Management Important Message (Signed)
Important Message  Patient Details  Name: [name] MRN: [id number] Date of Birth: 05-Jul-1928   Medicare Important Message Given:  Yes     Corey Harold 02/19/2019, 3:08 PM

## 2019-02-22 ENCOUNTER — Non-Acute Institutional Stay (SKILLED_NURSING_FACILITY): Payer: Medicare Other | Admitting: Adult Health

## 2019-02-22 ENCOUNTER — Encounter: Payer: Self-pay | Admitting: Adult Health

## 2019-02-22 DIAGNOSIS — M159 Polyosteoarthritis, unspecified: Secondary | ICD-10-CM

## 2019-02-22 DIAGNOSIS — E049 Nontoxic goiter, unspecified: Secondary | ICD-10-CM | POA: Diagnosis not present

## 2019-02-22 DIAGNOSIS — F339 Major depressive disorder, recurrent, unspecified: Secondary | ICD-10-CM

## 2019-02-22 DIAGNOSIS — F0390 Unspecified dementia without behavioral disturbance: Secondary | ICD-10-CM | POA: Diagnosis not present

## 2019-02-22 DIAGNOSIS — M1990 Unspecified osteoarthritis, unspecified site: Secondary | ICD-10-CM | POA: Insufficient documentation

## 2019-02-22 DIAGNOSIS — J9601 Acute respiratory failure with hypoxia: Secondary | ICD-10-CM | POA: Diagnosis not present

## 2019-02-22 DIAGNOSIS — K219 Gastro-esophageal reflux disease without esophagitis: Secondary | ICD-10-CM | POA: Insufficient documentation

## 2019-02-22 DIAGNOSIS — M8949 Other hypertrophic osteoarthropathy, multiple sites: Secondary | ICD-10-CM

## 2019-02-22 DIAGNOSIS — E46 Unspecified protein-calorie malnutrition: Secondary | ICD-10-CM | POA: Insufficient documentation

## 2019-02-22 DIAGNOSIS — I10 Essential (primary) hypertension: Secondary | ICD-10-CM | POA: Diagnosis not present

## 2019-02-22 DIAGNOSIS — E440 Moderate protein-calorie malnutrition: Secondary | ICD-10-CM

## 2019-02-22 HISTORY — DX: Major depressive disorder, recurrent, unspecified: F33.9

## 2019-02-22 NOTE — Progress Notes (Signed)
Location:    [facility] Room Number: 149D Place of Service:  SNF (31) Carleene Overlie NP    CODE STATUS: DNR  Allergies  Allergen Reactions  . Hydrocodone-Acetaminophen Nausea And Vomiting    Chief Complaint  Patient presents with  . Hospitalization Follow-up    Hospitalization Follow - up    HPI:  She is a 86 year old woman who has been hospitalized from 02-11-19 through 02-19-19. She was treated for acute respiratory failure and pneumonia. She continues to require supplemental 02. She is here for short term rehab with her goal to return to assisted living. There are no reports of uncontrolled pain. She continues to have a cough; no shortness of breath. No changes in appetite. She will continue to be followed for her chronic illnesses including: dementia; hypertension; depression.   Past Medical History:  Diagnosis Date  . Anxiety   . Dementia (HCC)   . GERD (gastroesophageal reflux disease)   . Goiter   . Hypertension   . Osteoarthritis   . Osteoporosis   . Vitamin D insufficiency     Past Surgical History:  Procedure Laterality Date  . APPENDECTOMY    . KIDNEY STONE SURGERY    . ORIF HIP FRACTURE Right 11/20/2012   Procedure: OPEN REDUCTION INTERNAL FIXATION RIGHT HIP;  Surgeon: Darreld Mclean, MD;  Location: [facility];  Service: Orthopedics;  Laterality: Right;  . stress fractures of legs    . TUBAL LIGATION      Social History   Socioeconomic History  . Marital status: Widowed    Spouse name: Not on file  . Number of children: Not on file  . Years of education: Not on file  . Highest education level: Not on file  Occupational History  . Not on file  Tobacco Use  . Smoking status: Never Smoker  . Smokeless tobacco: Never Used  Substance and Sexual Activity  . Alcohol use: No  . Drug use: No  . Sexual activity: Never  Other Topics Concern  . Not on file  Social History Narrative  . Not on file   Social Determinants of Health    Financial Resource Strain:   . Difficulty of Paying Living Expenses: Not on file  Food Insecurity:   . Worried About Programme researcher, broadcasting/film/video in the Last Year: Not on file  . Ran Out of Food in the Last Year: Not on file  Transportation Needs:   . Lack of Transportation (Medical): Not on file  . Lack of Transportation (Non-Medical): Not on file  Physical Activity:   . Days of Exercise per Week: Not on file  . Minutes of Exercise per Session: Not on file  Stress:   . Feeling of Stress : Not on file  Social Connections:   . Frequency of Communication with Friends and Family: Not on file  . Frequency of Social Gatherings with Friends and Family: Not on file  . Attends Religious Services: Not on file  . Active Member of Clubs or Organizations: Not on file  . Attends Banker Meetings: Not on file  . Marital Status: Not on file  Intimate Partner Violence:   . Fear of Current or Ex-Partner: Not on file  . Emotionally Abused: Not on file  . Physically Abused: Not on file  . Sexually Abused: Not on file   Family History  Problem Relation Age of Onset  . Heart attack Father   . Heart disease Neg Hx  VITAL SIGNS BP 127/65   Pulse 83   Temp 97.9 F (36.6 C) (Oral)   Resp 20   Ht 5\' 2"  (1.575 m)   Wt 121 lb 3.2 oz (55 kg)   SpO2 90%   BMI 22.17 kg/m   Outpatient Encounter Medications as of 02/22/2019  Medication Sig  . acetaminophen (TYLENOL) 500 MG tablet Take 1,000 mg by mouth 3 (three) times daily. For pain  . dextromethorphan-guaiFENesin (TUSSIN DM) 10-100 MG/5ML liquid Take 10 mLs by mouth every 6 (six) hours as needed for cough.  . donepezil (ARICEPT) 10 MG tablet Take 10 mg by mouth daily.  Marland Kitchen escitalopram (LEXAPRO) 10 MG tablet Take 10 mg by mouth daily.  Marland Kitchen esomeprazole (NEXIUM) 40 MG capsule Take 40 mg by mouth every morning.  . feeding supplement, ENSURE ENLIVE, (ENSURE ENLIVE) LIQD Take 237 mLs by mouth 3 (three) times daily between meals.  .  lidocaine (LIDODERM) 5 % Place 1 patch onto the skin daily. Remove & Discard patch within 12 hours or as directed by MD  . loperamide (IMODIUM) 2 MG capsule Take 2 mg by mouth every 4 (four) hours as needed for diarrhea or loose stools.  Marland Kitchen losartan (COZAAR) 100 MG tablet Take 100 mg by mouth daily.  . NON FORMULARY Diet - Dysphagia I NAS, ConCho  . NON FORMULARY Incentive Spirometer Special Instructions: while awake x 5 repetitions Every 2 Hours   No facility-administered encounter medications on file as of 02/22/2019.     SIGNIFICANT DIAGNOSTIC EXAMS  TODAY  02-11-19: chest x-ray:  1. Bilateral perihilar and bibasilar streaky densities concerning for developing infiltrate. Follow-up recommended. 2. Probable small bilateral pleural effusions.  02-17-19: ct of chest/angio of chest:  Patchy bilateral ground-glass opacities, most pronounced in the right upper lobe most compatible with pneumonia.     Small bilateral pleural effusions with bilateral lower lobe atelectasis or pneumonia. Cardiomegaly, coronary artery disease. Moderate-sized hiatal hernia. Aortic Atherosclerosis   02-18-19: 2-d echo:   1. Left ventricular ejection fraction, by visual estimation, is 70 to 75%. The left ventricle has hyperdynamic function. There is no left ventricular hypertrophy.  2. Left ventricular diastolic parameters are indeterminate.  3. The left ventricle has no regional wall motion abnormalities.  4. Global right ventricle was not well visualized.The right ventricular size is not well visualized. Right vetricular wall thickness was not assessed.  5. Left atrial size was not well visualized.  6. Right atrial size was not well visualized.  7. The mitral valve was not well visualized. No evidence of mitral valve regurgitation. No evidence of mitral stenosis.  8. The tricuspid valve is not well visualized.  9. The aortic valve was not well visualized. Aortic valve regurgitation is not visualized.  Technically difficult assement, grossly there is no significant aortic stenosis. 10. The pulmonic valve was not well visualized. Pulmonic valve regurgitation is not visualized. 11. The interatrial septum was not well visualized. 12. Technically difficult and limited study   LABS REVIEWED TODAY  02-11-19: wbc 4.2; hgb 12.9; hct 39.8 mcv 96.4 plt 211; glucose 112; bun 25; creat 0.83; k+ 3.1; na++ 134; ca 8.6; liver normal albumin 3.3 blood culture: no growth 02-14-19: glucose 129; bun 16; creat 0.56; k+ 3.8; na++ 140; ca 8.6 hgb a1c 6.3 02-17-19: wbc 7.6; hgb 11.2 hct 35.9; mcv 97.6 plt 256; glucose 114; bun 15; creat 0.48; k+ 4.1; na++ 138; ca 8.7; mag 2.1; tsh 0.369 free t4: 1.18; vit B12: 251 folate 7.3 d-dimer 1.51 02-19-19: wbc  8.6; hgb 11.0; hct 34.0; mcv 94.2 plt 313; glucose 115; bun 16; creat 0.63; k+ 3.6; na++ 134; ca 8.4 mag 1.9   Review of Systems  Unable to perform ROS: Dementia (unable to participate )    Physical Exam Constitutional:      General: She is not in acute distress.    Appearance: She is well-developed. She is not diaphoretic.  Neck:     Thyroid: No thyromegaly.  Cardiovascular:     Rate and Rhythm: Normal rate and regular rhythm.     Pulses: Normal pulses.     Heart sounds: Normal heart sounds.  Pulmonary:     Effort: Pulmonary effort is normal. No respiratory distress.     Comments: 02 dependent Breath sounds diminished in bases  Abdominal:     General: Bowel sounds are normal. There is no distension.     Palpations: Abdomen is soft.     Tenderness: There is no abdominal tenderness.  Musculoskeletal:        General: Normal range of motion.     Cervical back: Neck supple.     Right lower leg: No edema.     Left lower leg: No edema.     Comments: Kyphosis   Lymphadenopathy:     Cervical: No cervical adenopathy.  Skin:    General: Skin is warm and dry.     Comments: Has bruising on arms   Neurological:     Mental Status: She is alert. Mental status is  at baseline.  Psychiatric:        Mood and Affect: Mood normal.      ASSESSMENT/ PLAN:  TODAY  1. Acute respiratory failure with hypoxia: is stable is 02 dependent will use I/S every 2 hours while awake will monitor her status.   2. Essential hypertension: is stable b/p 127/65 will continue cozaar 100 mg daily   3. Goiter: tsh 0.369 will monitor   4. Dementia without behavioral disturbance unspecified dementia type: is stable weight is 121 pounds will continue aricept 10 mg daily   5. Primary osteoarthritis multiple joints: is stable will continue tylenol 1 gm three times daily has lidoderm patch daily   6. GERD without esophagitis: is stable will continue nexium 40 mg daily   7. Protein calorie malnutrition: is stable albumin 3.3 will continue supplements as directed  8. Major depression chronic: is stable will continue lexapro 10 mg daily   Will check cbc; bmp.   MD is aware of resident's narcotic use and is in agreement with current plan of care. We will attempt to wean resident as appropriate.  [name] NP [facility]  Contact [phone] Monday through Friday 8am- 5pm  After hours call [phone]

## 2019-02-23 ENCOUNTER — Other Ambulatory Visit: Payer: Self-pay | Admitting: *Deleted

## 2019-02-23 NOTE — Patient Outreach (Signed)
Screened for potential [facility] Care Management needs as a benefit of  [employer].  [name] is currently receiving skilled therapy at [facility].   Writer attended telephonic interdisciplinary team meeting to assess for disposition needs and transition plan for resident.   Facility reports member is max assistance with therapy and is not eating. Palliative Medicine Team consult was done while member was in the hospital. Family was not ready for comfort or hospice at that time. Member lived at [facility] prior.   Will continue to follow for transition plans and potential [facility] needs.   Kendra Noble, MSN-Ed, RN,BSN [facility] Post Acute Care Coordinator [phone] [facility]) [phone]  (Toll free office)

## 2019-02-24 ENCOUNTER — Non-Acute Institutional Stay (SKILLED_NURSING_FACILITY): Payer: Medicare Other | Admitting: Internal Medicine

## 2019-02-24 ENCOUNTER — Encounter: Payer: Self-pay | Admitting: Internal Medicine

## 2019-02-24 DIAGNOSIS — I10 Essential (primary) hypertension: Secondary | ICD-10-CM

## 2019-02-24 DIAGNOSIS — G9341 Metabolic encephalopathy: Secondary | ICD-10-CM

## 2019-02-24 DIAGNOSIS — J189 Pneumonia, unspecified organism: Secondary | ICD-10-CM | POA: Diagnosis not present

## 2019-02-24 DIAGNOSIS — J9601 Acute respiratory failure with hypoxia: Secondary | ICD-10-CM | POA: Diagnosis not present

## 2019-02-24 DIAGNOSIS — F0390 Unspecified dementia without behavioral disturbance: Secondary | ICD-10-CM | POA: Diagnosis not present

## 2019-02-24 DIAGNOSIS — F339 Major depressive disorder, recurrent, unspecified: Secondary | ICD-10-CM | POA: Diagnosis not present

## 2019-02-24 DIAGNOSIS — K219 Gastro-esophageal reflux disease without esophagitis: Secondary | ICD-10-CM | POA: Diagnosis not present

## 2019-02-24 NOTE — Progress Notes (Signed)
: Provider:  Hennie Duos., MD Location:  [facility] Room Number: 149-D Place of Service:  SNF ([phone])  PCP: Asencion Noble, MD Patient Care Team: Asencion Noble, MD as PCP - General (Internal Medicine) [name], MD as PCP - Cardiology (Cardiology)  Extended Emergency Contact Information Primary Emergency Contact: Vidhi, Delellis Mobile Phone: [phone] Relation: Son Secondary Emergency Contact: Mccullar,Greg Address: [address]          [place], [place] [address] [name] of [place] Phone: [phone] Mobile Phone: [phone] Relation: Son     Allergies: Hydrocodone-acetaminophen  Chief Complaint  Patient presents with  . New Admit To SNF    New admission to [facility]    HPI: Patient is an 86 y.o. female with dementia, hypertension, GERD, osteoporosis, who presented from assisted living with generalized weakness and hypoxia and increased confusion.  In the emergency department patient was afebrile but hypoxic with oxygenation of 86% on room air.  Chest x-ray at that time showed bilateral perihilar and bibasilar densities, bilateral patchy groundglass opacities, small bilateral pleural effusions.  Patient was started on Rocephin and Zithromax but remained hypoxic during the hospitalization despite treatment for her pneumonia.  Palliative care was consulted and patient's CODE STATUS was changed to DNR.  Patient finished 7 days of IV antibiotics and after being off of antibiotics for 3 days she continued to improve slightly.  She was treated IV Lasix x1 for fluid overload with improved oxygenation.  Patient is admitted to skilled nursing facility for residential care and OT/PT as she is able.  While at skilled nursing facility patient will be followed for dementia treated with Aricept hypertension treated with Cozaar and depression treated with Lexapro.  Past Medical History:  Diagnosis Date  . Anxiety   . Dementia ([place])   . GERD  (gastroesophageal reflux disease)   . Goiter   . Hypertension   . Osteoarthritis   . Osteoporosis   . Vitamin D insufficiency     Past Surgical History:  Procedure Laterality Date  . APPENDECTOMY    . KIDNEY STONE SURGERY    . ORIF HIP FRACTURE Right 11/20/2012   Procedure: OPEN REDUCTION INTERNAL FIXATION RIGHT HIP;  Surgeon: Sanjuana Kava, MD;  Location: [facility];  Service: Orthopedics;  Laterality: Right;  . stress fractures of legs    . TUBAL LIGATION      Allergies as of 02/24/2019      Reactions   Hydrocodone-acetaminophen Nausea And Vomiting      Medication List    Notice   This visit is during an admission. Changes to the med list made in this visit will be reflected in the After Visit Summary of the admission.    Current Outpatient Medications on File Prior to Visit  Medication Sig Dispense Refill  . acetaminophen (TYLENOL) 500 MG tablet Take 1,000 mg by mouth 3 (three) times daily. For pain    . Amino Acids-Protein Hydrolys (FEEDING SUPPLEMENT, PRO-STAT SUGAR FREE 64,) LIQD Take 30 mLs by mouth 3 (three) times daily with meals.    Marland Kitchen dextromethorphan-guaiFENesin (TUSSIN DM) 10-100 MG/5ML liquid Take 10 mLs by mouth every 6 (six) hours as needed for cough.    . donepezil (ARICEPT) 10 MG tablet Take 10 mg by mouth daily.    Marland Kitchen escitalopram (LEXAPRO) 10 MG tablet Take 10 mg by mouth daily.    Marland Kitchen esomeprazole (NEXIUM) 40 MG capsule Take 40 mg by mouth every morning.    Marland Kitchen  feeding supplement, ENSURE ENLIVE, (ENSURE ENLIVE) LIQD Take 237 mLs by mouth 3 (three) times daily between meals. 237 mL 12  . lidocaine (LIDODERM) 5 % Place 1 patch onto the skin daily. Remove & Discard patch within 12 hours or as directed by MD 30 patch 0  . loperamide (IMODIUM) 2 MG capsule Take 2 mg by mouth every 4 (four) hours as needed for diarrhea or loose stools.    Marland Kitchen losartan (COZAAR) 100 MG tablet Take 100 mg by mouth daily.    . NON FORMULARY Diet - Dysphagia I NAS, ConCho    . NON FORMULARY  Incentive Spirometer Special Instructions: while awake x 5 repetitions Every 2 Hours     No current facility-administered medications on file prior to visit.     No orders of the defined types were placed in this encounter.   Immunization History  Administered Date(s) Administered  . Influenza-Unspecified 11/26/2018  . MMR 04/06/2015  . Pneumococcal-Unspecified 11/16/2001  . Tdap 03/08/2011    Social History   Tobacco Use  . Smoking status: Never Smoker  . Smokeless tobacco: Never Used  Substance Use Topics  . Alcohol use: No    Family history is   Family History  Problem Relation Age of Onset  . Heart attack Father   . Heart disease Neg Hx       Review of Systems   unable to obtain secondary to dementia     Vitals:   02/24/19 1116  BP: 117/64  Pulse: 81  Resp: 20  Temp: 98.8 F (37.1 C)  SpO2: 90%    SpO2 Readings from Last 1 Encounters:  02/24/19 90%   Body mass index is 23.44 kg/m.     Physical Exam  GENERAL APPEARANCE: Alert,   No acute distress.  SKIN: No diaphoresis rash HEAD: Normocephalic, atraumatic  EYES: Conjunctiva/lids clear. Pupils round, reactive. EOMs intact.  EARS: External exam WNL, canals clear. Hearing grossly normal.  NOSE: No deformity or discharge.  MOUTH/THROAT: Lips w/o lesions  RESPIRATORY: Breathing is even, unlabored. Lung sounds are decreased at bases; patient with O2 nasal cannula CARDIOVASCULAR: Heart RRR no murmurs, rubs or gallops. No peripheral edema.   GASTROINTESTINAL: Abdomen is soft, non-tender, not distended w/ normal bowel sounds. GENITOURINARY: Bladder non tender, not distended  MUSCULOSKELETAL: No abnormal joints or musculature except for kyphosis NEUROLOGIC:  Cranial nerves 2-12 grossly intact. Moves all extremities  PSYCHIATRIC: Mood and affect with dementia, no behavioral issues  Patient Active Problem List   Diagnosis Date Noted  . Osteoarthritis 02/22/2019  . GERD without esophagitis  02/22/2019  . Protein-calorie malnutrition ([place]) 02/22/2019  . Major depression, recurrent, chronic ([place]) 02/22/2019  . Acute metabolic encephalopathy 35/57/3220  . Acute respiratory failure with hypoxia ([place]) 02/17/2019  . Failure to thrive in adult   . Hypokalemia 02/14/2019  . Weakness 02/14/2019  . Hypoxia 02/14/2019  . Dehydration 02/14/2019  . Metabolic encephalopathy 25/42/7062  . DNR (do not resuscitate) 02/14/2019  . Goals of care, counseling/discussion 02/14/2019  . Comfort measures only status 02/14/2019  . Dementia ([employer]) 02/12/2019  . CAP (community acquired pneumonia) 02/11/2019  . Chest pain 05/14/2016  . Goiter 05/14/2016  . Intertrochanteric fracture of right hip ([place]) 11/19/2012  . UTI (urinary tract infection) 11/19/2012  . Dizziness and giddiness 06/14/2012  . HTN (hypertension) 06/14/2012  . Difficulty in walking(719.7) 09/09/2011  . Osteoarthritis of leg 09/09/2011  . Abnormality of gait 09/09/2011  . Pain in joint, lower leg 09/09/2011  Labs reviewed: Basic Metabolic Panel:    Component Value Date/Time   NA 134 (L) 02/19/2019 0651   K 3.6 02/19/2019 0651   CL 98 02/19/2019 0651   CO2 25 02/19/2019 0651   GLUCOSE 115 (H) 02/19/2019 0651   BUN 16 02/19/2019 0651   CREATININE 0.63 02/19/2019 0651   CREATININE 0.93 (H) 12/04/2016 1348   CALCIUM 8.4 (L) 02/19/2019 0651   PROT 6.6 02/18/2019 0816   ALBUMIN 2.3 (L) 02/18/2019 0816   AST 31 02/18/2019 0816   ALT 40 02/18/2019 0816   ALKPHOS 42 02/18/2019 0816   BILITOT 0.8 02/18/2019 0816   GFRNONAA >60 02/19/2019 0651   GFRAA >60 02/19/2019 0651    Recent Labs    02/13/19 0959 02/14/19 0644 02/17/19 0600 02/18/19 0816 02/19/19 0651  NA 137   < > 138 136 134*  K 2.7*   < > 4.1 3.8 3.6  CL 104   < > 103 103 98  CO2 25   < > '25 26 25  '$ GLUCOSE 190*   < > 114* 114* 115*  BUN 14   < > '15 13 16  '$ CREATININE 0.70   < > 0.48 0.50 0.63  CALCIUM 8.3*   < > 8.7* 8.4* 8.4*  MG 1.8  --  2.1  --   1.9   < > = values in this interval not displayed.   Liver Function Tests: Recent Labs    02/11/19 0241 02/18/19 0816  AST 19 31  ALT 11 40  ALKPHOS 43 42  BILITOT 0.7 0.8  PROT 7.3 6.6  ALBUMIN 3.3* 2.3*   No results for input(s): LIPASE, AMYLASE in the last 8760 hours. Recent Labs    02/17/19 1659  AMMONIA 21   CBC: Recent Labs    02/11/19 0241 02/11/19 0241 02/12/19 0704 02/17/19 0600 02/19/19 0651  WBC 4.2   < > 5.7 7.6 8.6  NEUTROABS 3.1  --   --   --   --   HGB 12.9   < > 12.1 11.2* 11.0*  HCT 39.8   < > 38.1 35.9* 34.0*  MCV 96.4   < > 96.0 97.6 94.2  PLT 211   < > 181 256 313   < > = values in this interval not displayed.   Lipid No results for input(s): CHOL, HDL, LDLCALC, TRIG in the last 8760 hours.  Cardiac Enzymes: No results for input(s): CKTOTAL, CKMB, CKMBINDEX, TROPONINI in the last 8760 hours. BNP: Recent Labs    02/17/19 1659  BNP 119.0*   No results found for: [facility] Lab Results  Component Value Date   HGBA1C 6.3 (H) 02/14/2019   Lab Results  Component Value Date   TSH 0.369 02/17/2019   Lab Results  Component Value Date   VITAMINB12 251 02/17/2019   Lab Results  Component Value Date   FOLATE 7.3 02/17/2019   No results found for: IRON, TIBC, FERRITIN  Imaging and Procedures obtained prior to SNF admission: No results found.   Not all labs, radiology exams or other studies done during hospitalization come through on my EPIC note; however they are reviewed by me.    Assessment and Plan  Acute respiratory failure with hypoxia/bilateral lower lobe pneumonia/ fluid overload -patchy groundglass opacities by CT, D-dimer 1.51; Lasix 40 mg IV was given one-time; echo EF 70 to 75% SNF-patient admitted for residential care; patient was not treated for COVID-19 pneumonia which she most certainly had as patchy groundglass opacities pathognomonic for  Covid as well as an elevation in D-dimer, she should have gotten remdesivir and  Decadron despite her negative Covid test-high rate of false negatives-continue O2 5 L per cannula and try to wean if possible; patient was made DNR in the hospital per palliative care  Acute metabolic encephalopathy-multifactorial B12 was normal TSH 2.536, folic acid normal ammonia 21, UA without pyuria SNF-admitted for supportive care  Hypertension SNF-controlled; continue losartan 100 mg daily  Dementia without behaviors SNF-continue Aricept 10 mg daily  Depression SNF-almost impossible to tell if it is controlled or not; continue Lexapro 10 mg daily  GERD SNF-not stated as uncontrolled; continue Nexium 40 mg daily   Time spent greater than 45 minutes;> 50% of time with patient was spent reviewing records, labs, tests and studies, counseling and developing plan of care  Hennie Duos, MD

## 2019-02-26 ENCOUNTER — Encounter (HOSPITAL_COMMUNITY)
Admission: RE | Admit: 2019-02-26 | Discharge: 2019-02-26 | Disposition: A | Discharge status: Home / Self Care | Payer: Medicare Other | Source: Skilled Nursing Facility | Attending: Adult Health | Admitting: Adult Health

## 2019-02-26 DIAGNOSIS — I10 Essential (primary) hypertension: Secondary | ICD-10-CM | POA: Insufficient documentation

## 2019-02-26 DIAGNOSIS — D696 Thrombocytopenia, unspecified: Secondary | ICD-10-CM | POA: Insufficient documentation

## 2019-02-26 LAB — CBC
HCT: 37.2 % (ref 36.0–46.0)
Hemoglobin: 11.9 g/dL — ABNORMAL LOW (ref 12.0–15.0)
MCH: 31.2 pg (ref 26.0–34.0)
MCHC: 32 g/dL (ref 30.0–36.0)
MCV: 97.6 fL (ref 80.0–100.0)
Platelets: 337 10*3/uL (ref 150–400)
RBC: 3.81 MIL/uL — ABNORMAL LOW (ref 3.87–5.11)
RDW: 14 % (ref 11.5–15.5)
WBC: 11.8 10*3/uL — ABNORMAL HIGH (ref 4.0–10.5)
nRBC: 0 % (ref 0.0–0.2)

## 2019-02-26 LAB — BASIC METABOLIC PANEL
Anion gap: 10 (ref 5–15)
BUN: 14 mg/dL (ref 8–23)
CO2: 25 mmol/L (ref 22–32)
Calcium: 8.9 mg/dL (ref 8.9–10.3)
Chloride: 102 mmol/L (ref 98–111)
Creatinine, Ser: 0.66 mg/dL (ref 0.44–1.00)
GFR calc Af Amer: >60 mL/min (ref >60)
GFR calc non Af Amer: >60 mL/min (ref >60)
Glucose, Bld: 127 mg/dL — ABNORMAL HIGH (ref 70–99)
Potassium: 4.1 mmol/L (ref 3.5–5.1)
Sodium: 137 mmol/L (ref 135–145)

## 2019-03-01 ENCOUNTER — Encounter: Payer: Self-pay | Admitting: Internal Medicine

## 2019-03-01 ENCOUNTER — Non-Acute Institutional Stay (SKILLED_NURSING_FACILITY): Payer: Medicare Other | Admitting: Internal Medicine

## 2019-03-01 DIAGNOSIS — F0390 Unspecified dementia without behavioral disturbance: Secondary | ICD-10-CM

## 2019-03-01 DIAGNOSIS — F339 Major depressive disorder, recurrent, unspecified: Secondary | ICD-10-CM

## 2019-03-01 DIAGNOSIS — J9601 Acute respiratory failure with hypoxia: Secondary | ICD-10-CM | POA: Diagnosis not present

## 2019-03-01 DIAGNOSIS — M159 Polyosteoarthritis, unspecified: Secondary | ICD-10-CM

## 2019-03-01 DIAGNOSIS — M8949 Other hypertrophic osteoarthropathy, multiple sites: Secondary | ICD-10-CM | POA: Diagnosis not present

## 2019-03-01 DIAGNOSIS — D72829 Elevated white blood cell count, unspecified: Secondary | ICD-10-CM | POA: Diagnosis not present

## 2019-03-01 NOTE — Progress Notes (Signed)
Location:    [facility] Room Number: 149/D Place of Service:  SNF [phone]) Provider:  Rudi Coco, MD  Patient Care Team: Asencion Noble, MD as PCP - General (Internal Medicine) [name], MD as PCP - Cardiology (Cardiology)  Extended Emergency Contact Information Primary Emergency Contact: Sunita, Demond Mobile Phone: [phone] Relation: Son Secondary Emergency Contact: Guidotti,Greg Address: [address]          [place], [place] [address] [name] of [place] Phone: [phone] Mobile Phone: [phone] Relation: Son  Code Status:  DNR Goals of care: Advanced Directive information Advanced Directives 03/01/2019  Does Patient Have a Medical Advance Directive? Yes  Type of Paramedic of [place];Living will;Out of facility DNR (pink MOST or yellow form)  Does patient want to make changes to medical advance directive? No - Patient declined  Copy of [place] in Chart? Yes - validated most recent copy scanned in chart (See row information)  Would patient like information on creating a medical advance directive? -  Pre-existing out of facility DNR order (yellow form or pink MOST form) -     Chief Complaint  Patient presents with  . Acute Visit    Behaviors    HPI:  Pt is a 86 y.o. female seen today for an acute visit for follow-up of dementia with some behaviors. She is here for rehab after hospitalization for acute respiratory failure pneumonia.  She is on supplemental oxygen and per nursing staff is stable in that regard.  She continues to have some agitation however with some combativeness with staff.  She is on Aricept 10 mg a day as well as Lexapro 10 mg a day.  She does not appear to be having pain per staff they feel this is more related to possibly dementia-depression.  Vital signs appear to be stable.       Past Medical History:  Diagnosis Date  . Anxiety   .  Dementia ([place])   . GERD (gastroesophageal reflux disease)   . Goiter   . Hypertension   . Osteoarthritis   . Osteoporosis   . Vitamin D insufficiency    Past Surgical History:  Procedure Laterality Date  . APPENDECTOMY    . KIDNEY STONE SURGERY    . ORIF HIP FRACTURE Right 11/20/2012   Procedure: OPEN REDUCTION INTERNAL FIXATION RIGHT HIP;  Surgeon: Sanjuana Kava, MD;  Location: [facility];  Service: Orthopedics;  Laterality: Right;  . stress fractures of legs    . TUBAL LIGATION      Allergies  Allergen Reactions  . Hydrocodone-Acetaminophen Nausea And Vomiting    Outpatient Encounter Medications as of 03/01/2019  Medication Sig  . acetaminophen (TYLENOL) 500 MG tablet Take 1,000 mg by mouth 3 (three) times daily. For pain  . Amino Acids-Protein Hydrolys (FEEDING SUPPLEMENT, PRO-STAT SUGAR FREE 64,) LIQD Take 30 mLs by mouth 3 (three) times daily with meals.  Marland Kitchen dextromethorphan-guaiFENesin (TUSSIN DM) 10-100 MG/5ML liquid Take 10 mLs by mouth every 6 (six) hours as needed for cough.  . donepezil (ARICEPT) 10 MG tablet Take 10 mg by mouth daily.  Marland Kitchen escitalopram (LEXAPRO) 10 MG tablet Take 10 mg by mouth daily.  Marland Kitchen esomeprazole (NEXIUM) 40 MG capsule Take 40 mg by mouth every morning.  . feeding supplement, ENSURE ENLIVE, (ENSURE ENLIVE) LIQD Take 237 mLs by mouth 3 (three) times daily between meals.  . lidocaine (LIDODERM) 5 % Place 1 patch onto the  skin daily. Remove & Discard patch within 12 hours or as directed by MD  . loperamide (IMODIUM) 2 MG capsule Take 2 mg by mouth every 4 (four) hours as needed for diarrhea or loose stools.  Marland Kitchen losartan (COZAAR) 100 MG tablet Take 100 mg by mouth daily.  . NON FORMULARY Diet - Dysphagia I NAS, ConCho  . NON FORMULARY Incentive Spirometer Special Instructions: while awake x 5 repetitions Every 2 Hours   No facility-administered encounter medications on file as of 03/01/2019.    Review of Systems   Is unobtainable secondary to dementia  please see HPI  Immunization History  Administered Date(s) Administered  . Influenza-Unspecified 11/26/2018  . MMR 04/06/2015  . Pneumococcal-Unspecified 11/16/2001  . Tdap 03/08/2011   Pertinent  Health Maintenance Due  Topic Date Due  . PNA vac Low Risk Adult (2 of 2 - PCV13) 11/17/2002  . INFLUENZA VACCINE  Completed  . DEXA SCAN  Completed   No flowsheet data found. Functional Status Survey:    Vitals:   03/01/19 1147  BP: 110/70  Pulse: 83  Resp: 20  Temp: 98 F (36.7 C)  TempSrc: Oral  SpO2: 90%  Weight: 120 lb (54.4 kg)  Height: 5' (1.524 m)   Body mass index is 23.44 kg/m. Physical Exam   In general this is a somewhat frail elderly female she is in no distress-initially was a little resistant to exam but eventually was cooperative.  Her skin is warm and dry.  Eyes visual acuity appears to be intact sclera and conjunctive are clear.  Oropharynx clear mucous membranes moist.  Chest is clear to auscultation she did follow simple verbal commands in this regard there is no labored breathing.  Air entry is somewhat shallow  Heart is regular rate and rhythm without murmur gallop or rub she does not have significant edema.  Abdomen is soft does not appear to be tender there are positive bowel sounds.  Musculoskeletal Limited exam since she is in bed but appears able to move all her extremities at relative baseline she does not appear to have pain with movement.  Neurologic appears to be grossly intact her speech is clear could not really appreciate any true lateralizing findings.  Psych she is oriented to self will follow some simple verbal commands --initially was a little agitated with exam but was cooperative     Labs reviewed: Recent Labs    02/13/19 0959 02/14/19 0644 02/17/19 0600 02/17/19 0600 02/18/19 0816 02/19/19 0651 02/26/19 0700  NA 137   < > 138   < > 136 134* 137  K 2.7*   < > 4.1   < > 3.8 3.6 4.1  CL 104   < > 103   < > 103 98 102   CO2 25   < > 25   < > '26 25 25  '$ GLUCOSE 190*   < > 114*   < > 114* 115* 127*  BUN 14   < > 15   < > '13 16 14  '$ CREATININE 0.70   < > 0.48   < > 0.50 0.63 0.66  CALCIUM 8.3*   < > 8.7*   < > 8.4* 8.4* 8.9  MG 1.8  --  2.1  --   --  1.9  --    < > = values in this interval not displayed.   Recent Labs    02/11/19 0241 02/18/19 0816  AST 19 31  ALT 11 40  ALKPHOS 43  42  BILITOT 0.7 0.8  PROT 7.3 6.6  ALBUMIN 3.3* 2.3*   Recent Labs    02/11/19 0241 02/12/19 0704 02/17/19 0600 02/19/19 0651 02/26/19 0700  WBC 4.2   < > 7.6 8.6 11.8*  NEUTROABS 3.1  --   --   --   --   HGB 12.9   < > 11.2* 11.0* 11.9*  HCT 39.8   < > 35.9* 34.0* 37.2  MCV 96.4   < > 97.6 94.2 97.6  PLT 211   < > 256 313 337   < > = values in this interval not displayed.   Lab Results  Component Value Date   TSH 0.369 02/17/2019   Lab Results  Component Value Date   HGBA1C 6.3 (H) 02/14/2019   No results found for: CHOL, HDL, LDLCALC, LDLDIRECT, TRIG, CHOLHDL  Significant Diagnostic Results in last 30 days:  CT CHEST WO CONTRAST  Result Date: 02/17/2019 CLINICAL DATA:  Pneumonia, hypoxia EXAM: CT CHEST WITHOUT CONTRAST TECHNIQUE: Multidetector CT imaging of the chest was performed following the standard protocol without IV contrast. COMPARISON:  Chest x-ray 02/11/2019 FINDINGS: Cardiovascular: Cardiomegaly. Tortuous aorta with calcifications. No aneurysm. Scattered coronary artery calcifications. Mediastinum/Nodes: Moderate-sized hiatal hernia. No mediastinal, hilar, or axillary adenopathy. 3.1 cm thyroid isthmic nodule. No follow-up recommended unless clinically warranted (ref: J Am Coll Radiol. 2015 Feb;12(2): 143-50). Lungs/Pleura: Small bilateral pleural effusions with bilateral lower lobe atelectasis or pneumonia. There are scattered patchy ground-glass airspace opacities in both lungs, most pronounced in the right upper lobe most compatible with pneumonia. Upper Abdomen: Imaging into the upper abdomen  shows no acute findings. Musculoskeletal: Near complete collapse of the T11 vertebral body. Moderate compression fracture at T8. These are age indeterminate. Chest wall soft tissues unremarkable. IMPRESSION: Patchy bilateral ground-glass opacities, most pronounced in the right upper lobe most compatible with pneumonia. Small bilateral pleural effusions with bilateral lower lobe atelectasis or pneumonia. Cardiomegaly, coronary artery disease. Moderate-sized hiatal hernia. Aortic Atherosclerosis (ICD10-I70.0). Electronically Signed   By: Rolm Baptise M.D.   On: 02/17/2019 19:55   CT ANGIO CHEST PE W OR WO CONTRAST  Result Date: 02/17/2019 CLINICAL DATA:  Elevated D-dimer.  Pneumonia, hypoxia. EXAM: CT ANGIOGRAPHY CHEST WITH CONTRAST TECHNIQUE: Multidetector CT imaging of the chest was performed using the standard protocol during bolus administration of intravenous contrast. Multiplanar CT image reconstructions and MIPs were obtained to evaluate the vascular anatomy. CONTRAST:  163m OMNIPAQUE IOHEXOL 350 MG/ML SOLN COMPARISON:  Chest x-ray 02/11/2019.  Noncontrast CT earlier today. FINDINGS: Cardiovascular: Cardiomegaly. Tortuous aorta with calcifications. No aneurysm. No filling defects in the pulmonary arteries to suggest pulmonary emboli. Mediastinum/Nodes: No mediastinal, hilar, or axillary adenopathy. Moderate-sized hiatal hernia. Bilateral thyroid nodules. Large isthmic nodule measures 3.1 cm. Due to advanced age, No follow-up recommended unless clinically warranted (ref: J Am Coll Radiol. 2015 Feb;12(2): 143-50). Lungs/Pleura: Small bilateral pleural effusions. Bilateral lower lobe airspace opacities could reflect atelectasis or pneumonia. Ground-glass airspace opacities in the lingula, right upper lobe and right middle lobe concerning for pneumonia. Upper Abdomen: Imaging into the upper abdomen shows no acute findings. Musculoskeletal: Chest wall soft tissues are unremarkable. Severe compression deformity  at T11 with vertebral plana. Moderate compression fracture at T8 and through the superior endplate of L1. Review of the MIP images confirms the above findings. IMPRESSION: No evidence of pulmonary embolus. Patchy bilateral ground-glass opacities most compatible with pneumonia. Small bilateral effusions with bilateral lower lobe atelectasis or pneumonia. Cardiomegaly, tortuous aorta. Moderate-sized hiatal hernia. Aortic Atherosclerosis (  ICD10-I70.0). Electronically Signed   By: Rolm Baptise M.D.   On: 02/17/2019 19:53   DG Chest Port 1 View  Result Date: 02/11/2019 CLINICAL DATA:  86 year old female with weakness. EXAM: PORTABLE CHEST 1 VIEW COMPARISON:  Chest radiograph dated 14 18. FINDINGS: Bilateral perihilar and bibasilar streaky densities concerning for developing infiltrate. There is blunting of the costophrenic angles which may represent small pleural effusions. No pneumothorax. Stable cardiac silhouette. Atherosclerotic calcification of the aorta. No acute osseous pathology. IMPRESSION: 1. Bilateral perihilar and bibasilar streaky densities concerning for developing infiltrate. Follow-up recommended. 2. Probable small bilateral pleural effusions. Electronically Signed   By: Anner Crete M.D.   On: 02/11/2019 02:41   ECHOCARDIOGRAM COMPLETE  Result Date: 02/18/2019   ECHOCARDIOGRAM REPORT   Patient Name:   [name] Date of Exam: 02/18/2019 Medical Rec #:  [id number]     Height:       63.0 in Accession #:    [id number]    Weight:       125.0 lb Date of Birth:  Feb 12, 1928    BSA:          1.58 m Patient Age:    67 years      BP:           115/65 mmHg Patient Gender: F             HR:           78 bpm. Exam Location:  Inpatient Procedure: 2D Echo Indications:    dyspnea 786.09  History:        Patient has no prior history of Echocardiogram examinations.                 Risk Factors:Hypertension.  Sonographer:    Jannett Celestine RDCS (AE) Referring Phys: [phone] DAVID TAT  Sonographer Comments: No  apical window. challenging study (please see comments) IMPRESSIONS  1. Left ventricular ejection fraction, by visual estimation, is 70 to 75%. The left ventricle has hyperdynamic function. There is no left ventricular hypertrophy.  2. Left ventricular diastolic parameters are indeterminate.  3. The left ventricle has no regional wall motion abnormalities.  4. Global right ventricle was not well visualized.The right ventricular size is not well visualized. Right vetricular wall thickness was not assessed.  5. Left atrial size was not well visualized.  6. Right atrial size was not well visualized.  7. The mitral valve was not well visualized. No evidence of mitral valve regurgitation. No evidence of mitral stenosis.  8. The tricuspid valve is not well visualized.  9. The aortic valve was not well visualized. Aortic valve regurgitation is not visualized. Technically difficult assement, grossly there is no significant aortic stenosis. 10. The pulmonic valve was not well visualized. Pulmonic valve regurgitation is not visualized. 11. The interatrial septum was not well visualized. 12. Technically difficult and limited study FINDINGS  Left Ventricle: Left ventricular ejection fraction, by visual estimation, is 70 to 75%. The left ventricle has hyperdynamic function. The left ventricle has no regional wall motion abnormalities. There is no left ventricular hypertrophy. Left ventricular diastolic parameters are indeterminate. Right Ventricle: The right ventricular size is not well visualized. Right vetricular wall thickness was not assessed. Global RV systolic function is was not well visualized. Left Atrium: Left atrial size was not well visualized. Right Atrium: Right atrial size was not well visualized Pericardium: There is no evidence of pericardial effusion. Mitral Valve: The mitral valve was not well visualized. No evidence of mitral  valve regurgitation. No evidence of mitral valve stenosis by observation. Tricuspid  Valve: The tricuspid valve is not well visualized. Tricuspid valve regurgitation unable to assess TR. Aortic Valve: The aortic valve was not well visualized. . There is mild thickening and mild calcification of the aortic valve. Aortic valve regurgitation is not visualized. Technically difficult assement, grossly there is no significant aortic stenosis. Mild aortic valve annular calcification. There is mild thickening of the aortic valve. There is mild calcification of the aortic valve. Pulmonic Valve: The pulmonic valve was not well visualized. Pulmonic valve regurgitation is not visualized. Pulmonic regurgitation is not visualized. No evidence of pulmonic stenosis. Aorta: The aortic root is normal in size and structure. IAS/Shunts: The interatrial septum was not well visualized.  LEFT VENTRICLE PLAX 2D LVIDd:         4.68 cm LVIDs:         2.63 cm LV PW:         1.00 cm LV IVS:        0.92 cm LVOT diam:     1.90 cm LV SV:         76 ml LV SV Index:   47.78 LVOT Area:     2.84 cm  LEFT ATRIUM         Index LA diam:    3.80 cm 2.40 cm/m  AORTIC VALVE LVOT Vmax:   66.50 cm/s LVOT Vmean:  53.500 cm/s LVOT VTI:    0.124 m  AORTA Ao Root diam: 3.30 cm  SHUNTS Systemic VTI:  0.12 m Systemic Diam: 1.90 cm  Carlyle Dolly MD Electronically signed by Carlyle Dolly MD Signature Date/Time: 02/18/2019/5:14:04 PM    Final     Assessment/Plan  #1-dementia with  behavioral disturbance-and history of significant depression --she is on Aricept 10 mg a day-she is on Lexapro 10 mg a day with-I did discuss this with nurse practitioner Bard Herbert who has assessed patient previously-will increase her Lexapro to 20 mg a day and monitor-she was cooperative with exam today but apparently at times does have agitation with staff-at this point will monitor before adding an additional agent.   #2 history of acute respiratory failure with hypoxia-she is O2 dependent-but appears to be stable in this regards lung exam was fairly  benign --oxygen saturations have been in the 90s-at this point will monitor-.  3.  History of osteoarthritis multiple joints-she continues on Tylenol 1000 mg 3 times daily as well as a Lidoderm patch pain does not appear to be contributing to her agitation.  4.-I do note on recent lab her white count was mildly elevated at 11,800-will order a CBC with differential tomorrow to monitor-she does not show signs of infection nursing has not noted any fever or complaints of chills or complaints of dysuria or increased cough or congestion.  Or increased diarrhea.  [id number]

## 2019-03-02 ENCOUNTER — Other Ambulatory Visit (HOSPITAL_COMMUNITY)
Admission: RE | Admit: 2019-03-02 | Discharge: 2019-03-02 | Disposition: A | Discharge status: Home / Self Care | Payer: No Typology Code available for payment source | Source: Ambulatory Visit | Attending: Internal Medicine | Admitting: Internal Medicine

## 2019-03-02 ENCOUNTER — Other Ambulatory Visit: Payer: Self-pay | Admitting: *Deleted

## 2019-03-02 ENCOUNTER — Encounter: Payer: Self-pay | Admitting: Internal Medicine

## 2019-03-02 DIAGNOSIS — I10 Essential (primary) hypertension: Secondary | ICD-10-CM | POA: Diagnosis not present

## 2019-03-02 LAB — CBC WITH DIFFERENTIAL/PLATELET
Abs Immature Granulocytes: 0.01 10*3/uL (ref 0.00–0.07)
Basophils Absolute: 0 10*3/uL (ref 0.0–0.1)
Basophils Relative: 1 %
Eosinophils Absolute: 0.1 10*3/uL (ref 0.0–0.5)
Eosinophils Relative: 2 %
HCT: 36.8 % (ref 36.0–46.0)
Hemoglobin: 11.5 g/dL — ABNORMAL LOW (ref 12.0–15.0)
Immature Granulocytes: 0 %
Lymphocytes Relative: 30 %
Lymphs Abs: 1.2 10*3/uL (ref 0.7–4.0)
MCH: 30.5 pg (ref 26.0–34.0)
MCHC: 31.3 g/dL (ref 30.0–36.0)
MCV: 97.6 fL (ref 80.0–100.0)
Monocytes Absolute: 0.4 10*3/uL (ref 0.1–1.0)
Monocytes Relative: 9 %
Neutro Abs: 2.3 10*3/uL (ref 1.7–7.7)
Neutrophils Relative %: 58 %
Platelets: 286 10*3/uL (ref 150–400)
RBC: 3.77 MIL/uL — ABNORMAL LOW (ref 3.87–5.11)
RDW: 13.9 % (ref 11.5–15.5)
WBC: 4 10*3/uL (ref 4.0–10.5)
nRBC: 0 % (ref 0.0–0.2)

## 2019-03-02 NOTE — Patient Outreach (Signed)
Screened for potential [facility] Care Management needs as a benefit of  [employer].  Mrs. Bettinger is currently receiving skilled therapy at [facility].   Spoke with [facility] RN after she  attended telephonic interdisciplinary team meeting with [facility] facility staff.   Member remains max assist  with therapy. She is from [facility]. Transition plans pending on progress and if ALF will accept resident back.   Will continue to follow while remaining in skilled care at [facility].  Raiford Noble, MSN-Ed, RN,BSN [facility] Post Acute Care Coordinator [phone] [facility]) [phone]  (Toll free office)

## 2019-03-09 ENCOUNTER — Encounter: Payer: Self-pay | Admitting: Adult Health

## 2019-03-09 ENCOUNTER — Non-Acute Institutional Stay (SKILLED_NURSING_FACILITY): Payer: Medicare Other | Admitting: Adult Health

## 2019-03-09 DIAGNOSIS — F0390 Unspecified dementia without behavioral disturbance: Secondary | ICD-10-CM | POA: Diagnosis not present

## 2019-03-09 DIAGNOSIS — J9601 Acute respiratory failure with hypoxia: Secondary | ICD-10-CM | POA: Diagnosis not present

## 2019-03-09 DIAGNOSIS — I10 Essential (primary) hypertension: Secondary | ICD-10-CM | POA: Diagnosis not present

## 2019-03-09 NOTE — Progress Notes (Signed)
Location:    [facility] Room Number: 149D Place of Service:  SNF (31) Carleene Overlie NP    CODE STATUS: DNR  Allergies  Allergen Reactions  . Hydrocodone-Acetaminophen Nausea And Vomiting    Chief Complaint  Patient presents with  . Medical Management of Chronic Issues        Acute respiratory failure with hypoxia:Essential hypertension:  Dementia without behavioral disturbance unspecified dementia type:   Weekly follow up for the first 30 days post hospitalization.     HPI:  She is a 86 year old short term rehab patient being seen for the management of her chronic illnesses: respiratory failure; hypertension; dementia. She is losing weight. There are no reports of uncontrolled pain; no reports of agitation or anxiety.   Past Medical History:  Diagnosis Date  . Anxiety   . Dementia (HCC)   . GERD (gastroesophageal reflux disease)   . Goiter   . Hypertension   . Osteoarthritis   . Osteoporosis   . Vitamin D insufficiency     Past Surgical History:  Procedure Laterality Date  . APPENDECTOMY    . KIDNEY STONE SURGERY    . ORIF HIP FRACTURE Right 11/20/2012   Procedure: OPEN REDUCTION INTERNAL FIXATION RIGHT HIP;  Surgeon: Darreld Mclean, MD;  Location: [facility];  Service: Orthopedics;  Laterality: Right;  . stress fractures of legs    . TUBAL LIGATION      Social History   Socioeconomic History  . Marital status: Widowed    Spouse name: Not on file  . Number of children: Not on file  . Years of education: Not on file  . Highest education level: Not on file  Occupational History  . Not on file  Tobacco Use  . Smoking status: Never Smoker  . Smokeless tobacco: Never Used  Substance and Sexual Activity  . Alcohol use: No  . Drug use: No  . Sexual activity: Never  Other Topics Concern  . Not on file  Social History Narrative  . Not on file   Social Determinants of Health   Financial Resource Strain:   . Difficulty of Paying Living  Expenses: Not on file  Food Insecurity:   . Worried About Programme researcher, broadcasting/film/video in the Last Year: Not on file  . Ran Out of Food in the Last Year: Not on file  Transportation Needs:   . Lack of Transportation (Medical): Not on file  . Lack of Transportation (Non-Medical): Not on file  Physical Activity:   . Days of Exercise per Week: Not on file  . Minutes of Exercise per Session: Not on file  Stress:   . Feeling of Stress : Not on file  Social Connections:   . Frequency of Communication with Friends and Family: Not on file  . Frequency of Social Gatherings with Friends and Family: Not on file  . Attends Religious Services: Not on file  . Active Member of Clubs or Organizations: Not on file  . Attends Banker Meetings: Not on file  . Marital Status: Not on file  Intimate Partner Violence:   . Fear of Current or Ex-Partner: Not on file  . Emotionally Abused: Not on file  . Physically Abused: Not on file  . Sexually Abused: Not on file   Family History  Problem Relation Age of Onset  . Heart attack Father   . Heart disease Neg Hx       VITAL SIGNS BP Marland Kitchen)  120/55   Pulse (!) 59   Temp (!) 96.8 F (36 C) (Oral)   Resp 20   Ht 5' (1.524 m)   Wt 115 lb 6.4 oz (52.3 kg)   SpO2 90%   BMI 22.54 kg/m   Outpatient Encounter Medications as of 03/09/2019  Medication Sig  . acetaminophen (TYLENOL) 500 MG tablet Take 1,000 mg by mouth 3 (three) times daily. For pain  . Amino Acids-Protein Hydrolys (FEEDING SUPPLEMENT, PRO-STAT SUGAR FREE 64,) LIQD Take 30 mLs by mouth 3 (three) times daily with meals. due to very poor meal intake (1-25%)  . dextromethorphan-guaiFENesin (TUSSIN DM) 10-100 MG/5ML liquid Take 10 mLs by mouth every 6 (six) hours as needed for cough.  . donepezil (ARICEPT) 10 MG tablet Take 10 mg by mouth daily.  Marland Kitchen escitalopram (LEXAPRO) 20 MG tablet Take 20 mg by mouth daily.  Marland Kitchen esomeprazole (NEXIUM) 40 MG capsule Take 40 mg by mouth every morning.  .  feeding supplement, ENSURE ENLIVE, (ENSURE ENLIVE) LIQD Take 237 mLs by mouth 3 (three) times daily between meals.  . lidocaine (LIDODERM) 5 % Place 1 patch onto the skin daily. Remove & Discard patch within 12 hours or as directed by MD  . loperamide (IMODIUM) 2 MG capsule Take 2 mg by mouth every 4 (four) hours as needed for diarrhea or loose stools.  Marland Kitchen losartan (COZAAR) 100 MG tablet Take 100 mg by mouth daily.  . NON FORMULARY Diet - Dysphagia I NAS, ConCho  . NON FORMULARY Incentive Spirometer Special Instructions: while awake x 5 repetitions Every 2 Hours  . OXYGEN Inhale 3 L into the lungs continuous.  . [DISCONTINUED] escitalopram (LEXAPRO) 10 MG tablet Take 10 mg by mouth daily.   No facility-administered encounter medications on file as of 03/09/2019.     SIGNIFICANT DIAGNOSTIC EXAMS  PREVIOUS  02-11-19: chest x-ray:  1. Bilateral perihilar and bibasilar streaky densities concerning for developing infiltrate. Follow-up recommended. 2. Probable small bilateral pleural effusions.  02-17-19: ct of chest/angio of chest:  Patchy bilateral ground-glass opacities, most pronounced in the right upper lobe most compatible with pneumonia.     Small bilateral pleural effusions with bilateral lower lobe atelectasis or pneumonia. Cardiomegaly, coronary artery disease. Moderate-sized hiatal hernia. Aortic Atherosclerosis   02-18-19: 2-d echo:   1. Left ventricular ejection fraction, by visual estimation, is 70 to 75%. The left ventricle has hyperdynamic function. There is no left ventricular hypertrophy.  2. Left ventricular diastolic parameters are indeterminate.  3. The left ventricle has no regional wall motion abnormalities.  4. Global right ventricle was not well visualized.The right ventricular size is not well visualized. Right vetricular wall thickness was not assessed.  5. Left atrial size was not well visualized.  6. Right atrial size was not well visualized.  7. The mitral valve  was not well visualized. No evidence of mitral valve regurgitation. No evidence of mitral stenosis.  8. The tricuspid valve is not well visualized.  9. The aortic valve was not well visualized. Aortic valve regurgitation is not visualized. Technically difficult assement, grossly there is no significant aortic stenosis. 10. The pulmonic valve was not well visualized. Pulmonic valve regurgitation is not visualized. 11. The interatrial septum was not well visualized. 12. Technically difficult and limited study  NO NEW EXAMS.    LABS REVIEWED PREVIOUS   02-11-19: wbc 4.2; hgb 12.9; hct 39.8 mcv 96.4 plt 211; glucose 112; bun 25; creat 0.83; k+ 3.1; na++ 134; ca 8.6; liver normal albumin 3.3 blood  culture: no growth 02-14-19: glucose 129; bun 16; creat 0.56; k+ 3.8; na++ 140; ca 8.6 hgb a1c 6.3 02-17-19: wbc 7.6; hgb 11.2 hct 35.9; mcv 97.6 plt 256; glucose 114; bun 15; creat 0.48; k+ 4.1; na++ 138; ca 8.7; mag 2.1; tsh 0.369 free t4: 1.18; vit B12: 251 folate 7.3 d-dimer 1.51 02-19-19: wbc 8.6; hgb 11.0; hct 34.0; mcv 94.2 plt 313; glucose 115; bun 16; creat 0.63; k+ 3.6; na++ 134; ca 8.4 mag 1.9   NO NEW LABS.   Review of Systems  Unable to perform ROS: Dementia (unable to participate )   Physical Exam Constitutional:      General: She is not in acute distress.    Appearance: She is well-developed. She is not diaphoretic.  Neck:     Thyroid: No thyromegaly.  Cardiovascular:     Rate and Rhythm: Normal rate and regular rhythm.     Pulses: Normal pulses.     Heart sounds: Normal heart sounds.  Pulmonary:     Effort: Pulmonary effort is normal. No respiratory distress.     Comments: 02 dependent Breath sounds diminished in bases  Abdominal:     General: Bowel sounds are normal. There is no distension.     Palpations: Abdomen is soft.     Tenderness: There is no abdominal tenderness.  Musculoskeletal:        General: Normal range of motion.     Cervical back: Neck supple.     Right  lower leg: No edema.     Left lower leg: No edema.     Comments: Kyphosis   Lymphadenopathy:     Cervical: No cervical adenopathy.  Skin:    General: Skin is warm and dry.  Neurological:     Mental Status: She is alert. Mental status is at baseline.  Psychiatric:        Mood and Affect: Mood normal.    ASSESSMENT/ PLAN:  TODAY  1. Acute respiratory failure with hypoxia: is stable is 02 dependent will continue to monitor her status.   2. Essential hypertension: is stable b/p 120/55 will continue cozaar 100 mg daily   3. Dementia without behavioral disturbance unspecified dementia type: is losing weight from 121 pounds to her current weight of 115 pounds; will stop aricept due to weight loss and will begin remeron 7.5 mg nightly through 04-06-19 for appetite will monitor   PREVIOUS   4. Primary osteoarthritis multiple joints: is stable will continue tylenol 1 gm three times daily has lidoderm patch daily   5. GERD without esophagitis: is stable will continue nexium 40 mg daily   6. Protein calorie malnutrition: is stable albumin 3.3 will continue supplements as directed  7. Major depression chronic: is stable will continue lexapro 20 mg daily   8. Goiter: tsh 0.369 will monitor    MD is aware of resident's narcotic use and is in agreement with current plan of care. We will attempt to wean resident as appropriate.  [name] NP [facility]  Contact [phone] Monday through Friday 8am- 5pm  After hours call [phone]

## 2019-03-10 ENCOUNTER — Non-Acute Institutional Stay (SKILLED_NURSING_FACILITY): Payer: Medicare Other | Admitting: Adult Health

## 2019-03-10 ENCOUNTER — Encounter: Payer: Self-pay | Admitting: Adult Health

## 2019-03-10 DIAGNOSIS — F0390 Unspecified dementia without behavioral disturbance: Secondary | ICD-10-CM

## 2019-03-10 DIAGNOSIS — E440 Moderate protein-calorie malnutrition: Secondary | ICD-10-CM | POA: Diagnosis not present

## 2019-03-10 DIAGNOSIS — R627 Adult failure to thrive: Secondary | ICD-10-CM

## 2019-03-10 NOTE — Progress Notes (Signed)
Location:    [facility] Room Number: 149D Place of Service:  SNF (31) Carleene Overlie NP    CODE STATUS: DNR  Allergies  Allergen Reactions  . Hydrocodone-Acetaminophen Nausea And Vomiting    Chief Complaint  Patient presents with  . Acute Visit    Care Plan Meeting    HPI:  We have come together for her care plan meeting. BIMS unable mood 0. Has family present. Has had one fall. She has poor cognition. Is not making progress. She is losing weight we did stop the aricept yesterday and began remeron for 30 days. There are no reports of uncontrolled pain; no reports of agitation. She continues to be followed for her chronic illnesses including: dementia; failure to thrive malnutrition.   Past Medical History:  Diagnosis Date  . Anxiety   . Dementia (HCC)   . GERD (gastroesophageal reflux disease)   . Goiter   . Hypertension   . Osteoarthritis   . Osteoporosis   . Vitamin D insufficiency     Past Surgical History:  Procedure Laterality Date  . APPENDECTOMY    . KIDNEY STONE SURGERY    . ORIF HIP FRACTURE Right 11/20/2012   Procedure: OPEN REDUCTION INTERNAL FIXATION RIGHT HIP;  Surgeon: Darreld Mclean, MD;  Location: [facility];  Service: Orthopedics;  Laterality: Right;  . stress fractures of legs    . TUBAL LIGATION      Social History   Socioeconomic History  . Marital status: Widowed    Spouse name: Not on file  . Number of children: Not on file  . Years of education: Not on file  . Highest education level: Not on file  Occupational History  . Not on file  Tobacco Use  . Smoking status: Never Smoker  . Smokeless tobacco: Never Used  Substance and Sexual Activity  . Alcohol use: No  . Drug use: No  . Sexual activity: Never  Other Topics Concern  . Not on file  Social History Narrative  . Not on file   Social Determinants of Health   Financial Resource Strain:   . Difficulty of Paying Living Expenses: Not on file  Food  Insecurity:   . Worried About Programme researcher, broadcasting/film/video in the Last Year: Not on file  . Ran Out of Food in the Last Year: Not on file  Transportation Needs:   . Lack of Transportation (Medical): Not on file  . Lack of Transportation (Non-Medical): Not on file  Physical Activity:   . Days of Exercise per Week: Not on file  . Minutes of Exercise per Session: Not on file  Stress:   . Feeling of Stress : Not on file  Social Connections:   . Frequency of Communication with Friends and Family: Not on file  . Frequency of Social Gatherings with Friends and Family: Not on file  . Attends Religious Services: Not on file  . Active Member of Clubs or Organizations: Not on file  . Attends Banker Meetings: Not on file  . Marital Status: Not on file  Intimate Partner Violence:   . Fear of Current or Ex-Partner: Not on file  . Emotionally Abused: Not on file  . Physically Abused: Not on file  . Sexually Abused: Not on file   Family History  Problem Relation Age of Onset  . Heart attack Father   . Heart disease Neg Hx       VITAL SIGNS BP 123/72  Pulse 70   Temp (!) 97.2 F (36.2 C) (Oral)   Resp 20   Ht 5' (1.524 m)   Wt 115 lb 6.4 oz (52.3 kg)   SpO2 90%   BMI 22.54 kg/m   Outpatient Encounter Medications as of 03/10/2019  Medication Sig  . acetaminophen (TYLENOL) 500 MG tablet Take 1,000 mg by mouth 3 (three) times daily. For pain  . Amino Acids-Protein Hydrolys (FEEDING SUPPLEMENT, PRO-STAT SUGAR FREE 64,) LIQD Take 30 mLs by mouth 3 (three) times daily with meals. due to very poor meal intake (1-25%)  . dextromethorphan-guaiFENesin (TUSSIN DM) 10-100 MG/5ML liquid Take 10 mLs by mouth every 6 (six) hours as needed for cough.  . escitalopram (LEXAPRO) 20 MG tablet Take 20 mg by mouth daily.  Marland Kitchen esomeprazole (NEXIUM) 40 MG capsule Take 40 mg by mouth every morning.  . feeding supplement, ENSURE ENLIVE, (ENSURE ENLIVE) LIQD Take 237 mLs by mouth 3 (three) times daily  between meals.  . lidocaine (LIDODERM) 5 % Place 1 patch onto the skin daily. Remove & Discard patch within 12 hours or as directed by MD  . loperamide (IMODIUM) 2 MG capsule Take 2 mg by mouth every 4 (four) hours as needed for diarrhea or loose stools.  Marland Kitchen losartan (COZAAR) 100 MG tablet Take 100 mg by mouth daily.  . mirtazapine (REMERON) 7.5 MG tablet Take 7.5 mg by mouth at bedtime.  . NON FORMULARY Diet Change: Dysphagia 2 (ground), NAS, ConCho  . NON FORMULARY Incentive Spirometer Special Instructions: while awake x 5 repetitions Every 2 Hours  . OXYGEN Inhale 3 L into the lungs continuous.  . [DISCONTINUED] donepezil (ARICEPT) 10 MG tablet Take 10 mg by mouth daily.   No facility-administered encounter medications on file as of 03/10/2019.     SIGNIFICANT DIAGNOSTIC EXAMS   PREVIOUS  02-11-19: chest x-ray:  1. Bilateral perihilar and bibasilar streaky densities concerning for developing infiltrate. Follow-up recommended. 2. Probable small bilateral pleural effusions.  02-17-19: ct of chest/angio of chest:  Patchy bilateral ground-glass opacities, most pronounced in the right upper lobe most compatible with pneumonia.     Small bilateral pleural effusions with bilateral lower lobe atelectasis or pneumonia. Cardiomegaly, coronary artery disease. Moderate-sized hiatal hernia. Aortic Atherosclerosis   02-18-19: 2-d echo:   1. Left ventricular ejection fraction, by visual estimation, is 70 to 75%. The left ventricle has hyperdynamic function. There is no left ventricular hypertrophy.  2. Left ventricular diastolic parameters are indeterminate.  3. The left ventricle has no regional wall motion abnormalities.  4. Global right ventricle was not well visualized.The right ventricular size is not well visualized. Right vetricular wall thickness was not assessed.  5. Left atrial size was not well visualized.  6. Right atrial size was not well visualized.  7. The mitral valve was not well  visualized. No evidence of mitral valve regurgitation. No evidence of mitral stenosis.  8. The tricuspid valve is not well visualized.  9. The aortic valve was not well visualized. Aortic valve regurgitation is not visualized. Technically difficult assement, grossly there is no significant aortic stenosis. 10. The pulmonic valve was not well visualized. Pulmonic valve regurgitation is not visualized. 11. The interatrial septum was not well visualized. 12. Technically difficult and limited study  NO NEW EXAMS.    LABS REVIEWED PREVIOUS   02-11-19: wbc 4.2; hgb 12.9; hct 39.8 mcv 96.4 plt 211; glucose 112; bun 25; creat 0.83; k+ 3.1; na++ 134; ca 8.6; liver normal albumin 3.3 blood culture:  no growth 02-14-19: glucose 129; bun 16; creat 0.56; k+ 3.8; na++ 140; ca 8.6 hgb a1c 6.3 02-17-19: wbc 7.6; hgb 11.2 hct 35.9; mcv 97.6 plt 256; glucose 114; bun 15; creat 0.48; k+ 4.1; na++ 138; ca 8.7; mag 2.1; tsh 0.369 free t4: 1.18; vit B12: 251 folate 7.3 d-dimer 1.51 02-19-19: wbc 8.6; hgb 11.0; hct 34.0; mcv 94.2 plt 313; glucose 115; bun 16; creat 0.63; k+ 3.6; na++ 134; ca 8.4 mag 1.9   NO NEW LABS.   Review of Systems  Unable to perform ROS: Dementia (unable to participate )   Physical Exam Constitutional:      General: She is not in acute distress.    Appearance: She is well-developed. She is not diaphoretic.  Neck:     Thyroid: No thyromegaly.  Cardiovascular:     Rate and Rhythm: Normal rate and regular rhythm.     Heart sounds: Normal heart sounds.  Pulmonary:     Effort: Pulmonary effort is normal. No respiratory distress.     Comments: 02 dependent Breath sounds diminished in bases  Abdominal:     General: Bowel sounds are normal. There is no distension.     Palpations: Abdomen is soft.     Tenderness: There is no abdominal tenderness.  Musculoskeletal:        General: Normal range of motion.     Right lower leg: No edema.     Left lower leg: No edema.     Comments: Kyphosis    Lymphadenopathy:     Cervical: No cervical adenopathy.  Skin:    General: Skin is warm and dry.  Neurological:     Mental Status: She is alert. Mental status is at baseline.  Psychiatric:        Mood and Affect: Mood normal.       ASSESSMENT/ PLAN:  TODAY;   1. Dementia without behavioral disturbance unspecified dementia type 2. Failure to thrive 3. Moderate protein calorie malnutrition  Will continue current medications Will continue current plan of care Will continue to monitor her status.     MD is aware of resident's narcotic use and is in agreement with current plan of care. We will attempt to wean resident as appropriate.  [name] NP [facility]  Contact [phone] Monday through Friday 8am- 5pm  After hours call [phone]

## 2019-03-15 ENCOUNTER — Encounter: Payer: Self-pay | Admitting: Adult Health

## 2019-03-15 ENCOUNTER — Non-Acute Institutional Stay (SKILLED_NURSING_FACILITY): Payer: Medicare Other | Admitting: Adult Health

## 2019-03-15 DIAGNOSIS — E440 Moderate protein-calorie malnutrition: Secondary | ICD-10-CM

## 2019-03-15 DIAGNOSIS — K219 Gastro-esophageal reflux disease without esophagitis: Secondary | ICD-10-CM | POA: Diagnosis not present

## 2019-03-15 DIAGNOSIS — M8949 Other hypertrophic osteoarthropathy, multiple sites: Secondary | ICD-10-CM

## 2019-03-15 DIAGNOSIS — M159 Polyosteoarthritis, unspecified: Secondary | ICD-10-CM

## 2019-03-15 NOTE — Progress Notes (Signed)
Location:    [facility] Room Number: 149D Place of Service:  SNF (31) Carleene Overlie NP    CODE STATUS: DNR  Allergies  Allergen Reactions  . Hydrocodone-Acetaminophen Nausea And Vomiting    Chief Complaint  Patient presents with  . Medical Management of Chronic Issues       Primary osteoarthritis multiple joints:   GERD without esophagitis:  Protein calorie malnutrition   Weekly follow up for the first 30 days post hospitalization.     HPI:  She is a 86 year old short term rehab patient being seen for te management of her chronic illnesses: osteoarthritis; gerd; malnutrition. There are no reports of uncontrolled pain; does need 02. No cough or shortness of breath no reports of anxiety or agitation.   Past Medical History:  Diagnosis Date  . Anxiety   . Dementia (HCC)   . GERD (gastroesophageal reflux disease)   . Goiter   . Hypertension   . Osteoarthritis   . Osteoporosis   . Vitamin D insufficiency     Past Surgical History:  Procedure Laterality Date  . APPENDECTOMY    . KIDNEY STONE SURGERY    . ORIF HIP FRACTURE Right 11/20/2012   Procedure: OPEN REDUCTION INTERNAL FIXATION RIGHT HIP;  Surgeon: Darreld Mclean, MD;  Location: [facility];  Service: Orthopedics;  Laterality: Right;  . stress fractures of legs    . TUBAL LIGATION      Social History   Socioeconomic History  . Marital status: Widowed    Spouse name: Not on file  . Number of children: Not on file  . Years of education: Not on file  . Highest education level: Not on file  Occupational History  . Not on file  Tobacco Use  . Smoking status: Never Smoker  . Smokeless tobacco: Never Used  Substance and Sexual Activity  . Alcohol use: No  . Drug use: No  . Sexual activity: Never  Other Topics Concern  . Not on file  Social History Narrative  . Not on file   Social Determinants of Health   Financial Resource Strain:   . Difficulty of Paying Living Expenses: Not on file    Food Insecurity:   . Worried About Programme researcher, broadcasting/film/video in the Last Year: Not on file  . Ran Out of Food in the Last Year: Not on file  Transportation Needs:   . Lack of Transportation (Medical): Not on file  . Lack of Transportation (Non-Medical): Not on file  Physical Activity:   . Days of Exercise per Week: Not on file  . Minutes of Exercise per Session: Not on file  Stress:   . Feeling of Stress : Not on file  Social Connections:   . Frequency of Communication with Friends and Family: Not on file  . Frequency of Social Gatherings with Friends and Family: Not on file  . Attends Religious Services: Not on file  . Active Member of Clubs or Organizations: Not on file  . Attends Banker Meetings: Not on file  . Marital Status: Not on file  Intimate Partner Violence:   . Fear of Current or Ex-Partner: Not on file  . Emotionally Abused: Not on file  . Physically Abused: Not on file  . Sexually Abused: Not on file   Family History  Problem Relation Age of Onset  . Heart attack Father   . Heart disease Neg Hx       VITAL  SIGNS BP 120/66   Pulse 89   Temp (!) 97.2 F (36.2 C) (Oral)   Resp 20   Ht 5' (1.524 m)   Wt 115 lb 6.4 oz (52.3 kg)   SpO2 90%   BMI 22.54 kg/m   Outpatient Encounter Medications as of 03/15/2019  Medication Sig  . acetaminophen (TYLENOL) 500 MG tablet Take 1,000 mg by mouth 3 (three) times daily. For pain  . Amino Acids-Protein Hydrolys (FEEDING SUPPLEMENT, PRO-STAT SUGAR FREE 64,) LIQD Take 30 mLs by mouth 3 (three) times daily with meals. due to very poor meal intake (1-25%)  . dextromethorphan-guaiFENesin (TUSSIN DM) 10-100 MG/5ML liquid Take 10 mLs by mouth every 6 (six) hours as needed for cough.  . escitalopram (LEXAPRO) 20 MG tablet Take 20 mg by mouth daily.  Marland Kitchen esomeprazole (NEXIUM) 40 MG capsule Take 40 mg by mouth every morning.  . feeding supplement, ENSURE ENLIVE, (ENSURE ENLIVE) LIQD Take 237 mLs by mouth 3 (three) times  daily between meals.  . lidocaine (LIDODERM) 5 % Place 1 patch onto the skin daily. Remove & Discard patch within 12 hours or as directed by MD  . loperamide (IMODIUM) 2 MG capsule Take 2 mg by mouth every 4 (four) hours as needed for diarrhea or loose stools.  Marland Kitchen losartan (COZAAR) 100 MG tablet Take 100 mg by mouth daily.  . mirtazapine (REMERON) 7.5 MG tablet Take 7.5 mg by mouth at bedtime.  . NON FORMULARY Soft food diet (NAS, ConCho)  . NON FORMULARY Incentive Spirometer Special Instructions: while awake x 5 repetitions Every 2 Hours  . OXYGEN Inhale 3 L into the lungs continuous.   No facility-administered encounter medications on file as of 03/15/2019.     SIGNIFICANT DIAGNOSTIC EXAMS   PREVIOUS  02-11-19: chest x-ray:  1. Bilateral perihilar and bibasilar streaky densities concerning for developing infiltrate. Follow-up recommended. 2. Probable small bilateral pleural effusions.  02-17-19: ct of chest/angio of chest:  Patchy bilateral ground-glass opacities, most pronounced in the right upper lobe most compatible with pneumonia.     Small bilateral pleural effusions with bilateral lower lobe atelectasis or pneumonia. Cardiomegaly, coronary artery disease. Moderate-sized hiatal hernia. Aortic Atherosclerosis   02-18-19: 2-d echo:   1. Left ventricular ejection fraction, by visual estimation, is 70 to 75%. The left ventricle has hyperdynamic function. There is no left ventricular hypertrophy.  2. Left ventricular diastolic parameters are indeterminate.  3. The left ventricle has no regional wall motion abnormalities.  4. Global right ventricle was not well visualized.The right ventricular size is not well visualized. Right vetricular wall thickness was not assessed.  5. Left atrial size was not well visualized.  6. Right atrial size was not well visualized.  7. The mitral valve was not well visualized. No evidence of mitral valve regurgitation. No evidence of mitral stenosis.   8. The tricuspid valve is not well visualized.  9. The aortic valve was not well visualized. Aortic valve regurgitation is not visualized. Technically difficult assement, grossly there is no significant aortic stenosis. 10. The pulmonic valve was not well visualized. Pulmonic valve regurgitation is not visualized. 11. The interatrial septum was not well visualized. 12. Technically difficult and limited study  NO NEW EXAMS.    LABS REVIEWED PREVIOUS   02-11-19: wbc 4.2; hgb 12.9; hct 39.8 mcv 96.4 plt 211; glucose 112; bun 25; creat 0.83; k+ 3.1; na++ 134; ca 8.6; liver normal albumin 3.3 blood culture: no growth 02-14-19: glucose 129; bun 16; creat 0.56; k+ 3.8;  na++ 140; ca 8.6 hgb a1c 6.3 02-17-19: wbc 7.6; hgb 11.2 hct 35.9; mcv 97.6 plt 256; glucose 114; bun 15; creat 0.48; k+ 4.1; na++ 138; ca 8.7; mag 2.1; tsh 0.369 free t4: 1.18; vit B12: 251 folate 7.3 d-dimer 1.51 02-19-19: wbc 8.6; hgb 11.0; hct 34.0; mcv 94.2 plt 313; glucose 115; bun 16; creat 0.63; k+ 3.6; na++ 134; ca 8.4 mag 1.9   NO NEW LABS.   Review of Systems  Unable to perform ROS: Dementia (unable to participate )   Physical Exam Constitutional:      General: She is not in acute distress.    Appearance: She is well-developed. She is not diaphoretic.  Neck:     Thyroid: No thyromegaly.  Cardiovascular:     Rate and Rhythm: Normal rate and regular rhythm.     Pulses: Normal pulses.     Heart sounds: Normal heart sounds.  Pulmonary:     Effort: Pulmonary effort is normal. No respiratory distress.     Comments: 02 dependent Breath sounds diminished in bases  Abdominal:     General: Bowel sounds are normal. There is no distension.     Palpations: Abdomen is soft.     Tenderness: There is no abdominal tenderness.  Musculoskeletal:        General: Normal range of motion.     Cervical back: Neck supple.     Right lower leg: No edema.     Left lower leg: No edema.     Comments: Kyphosis   Lymphadenopathy:      Cervical: No cervical adenopathy.  Skin:    General: Skin is warm and dry.  Neurological:     Mental Status: She is alert. Mental status is at baseline.  Psychiatric:        Mood and Affect: Mood normal.    ASSESSMENT/ PLAN:  TODAY  1. Primary osteoarthritis multiple joints: is stable will continue tylenol 1 gm 3 times daily and has lidoderm patch  2. GERD without esophagitis: is stable will continue nexium 40 mg daily   3. Protein calorie malnutrition is stable albumin 3.3 will continue supplements as directed. Is losing weight to 115 pounds.   PREVIOUS   4. Major depression chronic: is stable will continue lexapro 20 mg daily   5. Goiter: tsh 0.369 will monitor   6. Acute respiratory failure with hypoxia: is stable is 02 dependent will continue to monitor her status.   7. Essential hypertension: is stable b/p 120/66 will continue cozaar 100 mg daily   8. Dementia without behavioral disturbance unspecified dementia type: is losing weight from 121 pounds to her current weight of 115 pounds; is off aricept due to weight loss  will continue remeron 7.5 mg nightly through 04-06-19 for appetite will monitor        MD is aware of resident's narcotic use and is in agreement with current plan of care. We will attempt to wean resident as appropriate.  Ok Edwards NP [facility]  Contact [phone] Monday through Friday 8am- 5pm  After hours call [phone]

## 2019-03-17 ENCOUNTER — Other Ambulatory Visit (HOSPITAL_COMMUNITY)
Admission: RE | Admit: 2019-03-17 | Discharge: 2019-03-17 | Disposition: A | Discharge status: Home / Self Care | Payer: Medicare Other | Source: Other Acute Inpatient Hospital | Attending: Internal Medicine | Admitting: Internal Medicine

## 2019-03-17 ENCOUNTER — Non-Acute Institutional Stay (SKILLED_NURSING_FACILITY): Payer: Medicare Other | Admitting: Internal Medicine

## 2019-03-17 DIAGNOSIS — M810 Age-related osteoporosis without current pathological fracture: Secondary | ICD-10-CM | POA: Diagnosis present

## 2019-03-17 DIAGNOSIS — I10 Essential (primary) hypertension: Secondary | ICD-10-CM | POA: Insufficient documentation

## 2019-03-17 DIAGNOSIS — K219 Gastro-esophageal reflux disease without esophagitis: Secondary | ICD-10-CM | POA: Insufficient documentation

## 2019-03-17 DIAGNOSIS — R195 Other fecal abnormalities: Secondary | ICD-10-CM | POA: Diagnosis not present

## 2019-03-17 DIAGNOSIS — R55 Syncope and collapse: Secondary | ICD-10-CM

## 2019-03-17 LAB — COMPREHENSIVE METABOLIC PANEL
ALT: 15 U/L (ref 0–44)
AST: 22 U/L (ref 15–41)
Albumin: 3.2 g/dL — ABNORMAL LOW (ref 3.5–5.0)
Alkaline Phosphatase: 46 U/L (ref 38–126)
Anion gap: 10 (ref 5–15)
BUN: 36 mg/dL — ABNORMAL HIGH (ref 8–23)
CO2: 23 mmol/L (ref 22–32)
Calcium: 9.4 mg/dL (ref 8.9–10.3)
Chloride: 104 mmol/L (ref 98–111)
Creatinine, Ser: 1.06 mg/dL — ABNORMAL HIGH (ref 0.44–1.00)
GFR calc Af Amer: 54 mL/min — ABNORMAL LOW (ref >60)
GFR calc non Af Amer: 47 mL/min — ABNORMAL LOW (ref >60)
Glucose, Bld: 156 mg/dL — ABNORMAL HIGH (ref 70–99)
Potassium: 3.9 mmol/L (ref 3.5–5.1)
Sodium: 137 mmol/L (ref 135–145)
Total Bilirubin: 0.7 mg/dL (ref 0.3–1.2)
Total Protein: 6.7 g/dL (ref 6.5–8.1)

## 2019-03-17 LAB — CBC WITH DIFFERENTIAL/PLATELET
Abs Immature Granulocytes: 0.03 10*3/uL (ref 0.00–0.07)
Basophils Absolute: 0.1 10*3/uL (ref 0.0–0.1)
Basophils Relative: 1 %
Eosinophils Absolute: 0 10*3/uL (ref 0.0–0.5)
Eosinophils Relative: 0 %
HCT: 39.5 % (ref 36.0–46.0)
Hemoglobin: 12.2 g/dL (ref 12.0–15.0)
Immature Granulocytes: 0 %
Lymphocytes Relative: 18 %
Lymphs Abs: 1.6 10*3/uL (ref 0.7–4.0)
MCH: 30.5 pg (ref 26.0–34.0)
MCHC: 30.9 g/dL (ref 30.0–36.0)
MCV: 98.8 fL (ref 80.0–100.0)
Monocytes Absolute: 0.7 10*3/uL (ref 0.1–1.0)
Monocytes Relative: 8 %
Neutro Abs: 6.7 10*3/uL (ref 1.7–7.7)
Neutrophils Relative %: 73 %
Platelets: 343 10*3/uL (ref 150–400)
RBC: 4 MIL/uL (ref 3.87–5.11)
RDW: 14.6 % (ref 11.5–15.5)
WBC: 9.1 10*3/uL (ref 4.0–10.5)
nRBC: 0 % (ref 0.0–0.2)

## 2019-03-17 LAB — OCCULT BLOOD X 1 CARD TO LAB, STOOL: Fecal Occult Bld: POSITIVE — AB

## 2019-03-21 ENCOUNTER — Encounter: Payer: Self-pay | Admitting: Internal Medicine

## 2019-03-21 ENCOUNTER — Encounter (HOSPITAL_COMMUNITY)
Admission: AD | Admit: 2019-03-21 | Discharge: 2019-03-21 | Disposition: A | Discharge status: Home / Self Care | Payer: Medicare Other | Source: Skilled Nursing Facility | Attending: Internal Medicine | Admitting: Internal Medicine

## 2019-03-21 DIAGNOSIS — K921 Melena: Secondary | ICD-10-CM | POA: Insufficient documentation

## 2019-03-21 DIAGNOSIS — R55 Syncope and collapse: Secondary | ICD-10-CM | POA: Insufficient documentation

## 2019-03-21 LAB — OCCULT BLOOD X 1 CARD TO LAB, STOOL: Fecal Occult Bld: POSITIVE — AB

## 2019-03-21 NOTE — Progress Notes (Signed)
Location:   [employer] of Service:   SNF Provider:Balbina Depace D Aliveah Gallant MD  Asencion Noble, MD  Patient Care Team: Asencion Noble, MD as PCP - General (Internal Medicine) [name], MD as PCP - Cardiology (Cardiology)  Extended Emergency Contact Information Primary Emergency Contact: [place], [place] Mobile Phone: [phone] Relation: Son Secondary Emergency Contact: Tidwell,Greg Address: [address]          [place], [place] [address] [name] of [place] Phone: [phone] Mobile Phone: [phone] Relation: Son    Allergies: Hydrocodone-acetaminophen  Chief Complaint  Patient presents with  . Acute Visit    HPI: Patient is 86 y.o. female who who nursing asked me to say because she vomited while she was on the toilet and passed a black stool.  The vomit was heme-negative but the black stool was heme positive.  Patient's blood pressure right after the incident was 83/55 but it improved after lying down.  Patient's color improved and patient is O2 saturation was good throughout.  Past Medical History:  Diagnosis Date  . Anxiety   . Dementia ([place])   . GERD (gastroesophageal reflux disease)   . Goiter   . Hypertension   . Osteoarthritis   . Osteoporosis   . Vitamin D insufficiency     Past Surgical History:  Procedure Laterality Date  . APPENDECTOMY    . KIDNEY STONE SURGERY    . ORIF HIP FRACTURE Right 11/20/2012   Procedure: OPEN REDUCTION INTERNAL FIXATION RIGHT HIP;  Surgeon: Sanjuana Kava, MD;  Location: [facility];  Service: Orthopedics;  Laterality: Right;  . stress fractures of legs    . TUBAL LIGATION      Allergies as of 03/17/2019      Reactions   Hydrocodone-acetaminophen Nausea And Vomiting      Medication List    Notice   This visit is during an admission. Changes to the med list made in this visit will be reflected in the After Visit Summary of the admission.    Current Outpatient Medications on File Prior to Visit  Medication  Sig Dispense Refill  . acetaminophen (TYLENOL) 500 MG tablet Take 1,000 mg by mouth 3 (three) times daily. For pain    . Amino Acids-Protein Hydrolys (FEEDING SUPPLEMENT, PRO-STAT SUGAR FREE 64,) LIQD Take 30 mLs by mouth 3 (three) times daily with meals. due to very poor meal intake (1-25%)    . dextromethorphan-guaiFENesin (TUSSIN DM) 10-100 MG/5ML liquid Take 10 mLs by mouth every 6 (six) hours as needed for cough.    . escitalopram (LEXAPRO) 20 MG tablet Take 20 mg by mouth daily.    Marland Kitchen esomeprazole (NEXIUM) 40 MG capsule Take 40 mg by mouth every morning.    . feeding supplement, ENSURE ENLIVE, (ENSURE ENLIVE) LIQD Take 237 mLs by mouth 3 (three) times daily between meals. 237 mL 12  . lidocaine (LIDODERM) 5 % Place 1 patch onto the skin daily. Remove & Discard patch within 12 hours or as directed by MD 30 patch 0  . loperamide (IMODIUM) 2 MG capsule Take 2 mg by mouth every 4 (four) hours as needed for diarrhea or loose stools.    Marland Kitchen losartan (COZAAR) 100 MG tablet Take 100 mg by mouth daily.    . mirtazapine (REMERON) 7.5 MG tablet Take 7.5 mg by mouth at bedtime.    . NON FORMULARY Soft food diet (NAS, ConCho)    . NON FORMULARY Incentive Spirometer Special Instructions: while awake x 5  repetitions Every 2 Hours    . OXYGEN Inhale 3 L into the lungs continuous.     No current facility-administered medications on file prior to visit.     No orders of the defined types were placed in this encounter.   Immunization History  Administered Date(s) Administered  . Influenza-Unspecified 11/26/2018  . MMR 04/06/2015  . Pneumococcal-Unspecified 11/16/2001  . Tdap 03/08/2011    Social History   Tobacco Use  . Smoking status: Never Smoker  . Smokeless tobacco: Never Used  Substance Use Topics  . Alcohol use: No    Review of Systems   GENERAL:  no fevers, fatigue, appetite changes SKIN: No itching, rash HEENT: No complaint RESPIRATORY: No cough, wheezing, SOB CARDIAC: No  chest pain, palpitations, lower extremity edema  GI: No abdominal pain, No N/V/D or constipation, No heartburn or reflux  GU: No dysuria, frequency or urgency, or incontinence  MUSCULOSKELETAL: No unrelieved bone/joint pain NEUROLOGIC: No headache, dizziness  PSYCHIATRIC: No overt anxiety or sadness  Vitals:   03/21/19 2048  BP: (!) 105/59  Pulse: 70  Resp: 20  Temp: 98.6 F (37 C)  SpO2: 97%   There is no height or weight on file to calculate BMI. Physical Exam  GENERAL APPEARANCE: Alert, conversant, No acute distress  SKIN: No diaphoresis rash HEENT: Unremarkable RESPIRATORY: Breathing is even, unlabored. Lung sounds are clear   CARDIOVASCULAR: Heart RRR no murmurs, rubs or gallops. No peripheral edema  GASTROINTESTINAL: Abdomen is soft, non-tender, not distended w/ normal bowel sounds.;  Black stool on toilet that was heme positive, vomit that was heme-negative GENITOURINARY: Bladder non tender, not distended  MUSCULOSKELETAL: No abnormal joints or musculature NEUROLOGIC: Cranial nerves 2-12 grossly intact. Moves all extremities PSYCHIATRIC: Mood and affect appropriate to situation, no behavioral issues  Patient Active Problem List   Diagnosis Date Noted  . Osteoarthritis 02/22/2019  . GERD without esophagitis 02/22/2019  . Protein-calorie malnutrition ([place]) 02/22/2019  . Major depression, recurrent, chronic ([place]) 02/22/2019  . Acute metabolic encephalopathy 18/29/9371  . Acute respiratory failure with hypoxia ([place]) 02/17/2019  . Failure to thrive in adult   . Hypokalemia 02/14/2019  . Weakness 02/14/2019  . Hypoxia 02/14/2019  . Dehydration 02/14/2019  . Metabolic encephalopathy 69/67/8938  . DNR (do not resuscitate) 02/14/2019  . Goals of care, counseling/discussion 02/14/2019  . Comfort measures only status 02/14/2019  . Dementia ([place]) 02/12/2019  . Community acquired bilateral lower lobe pneumonia 02/11/2019  . Chest pain 05/14/2016  . Goiter 05/14/2016  .  Intertrochanteric fracture of right hip ([place]) 11/19/2012  . UTI (urinary tract infection) 11/19/2012  . Dizziness and giddiness 06/14/2012  . HTN (hypertension) 06/14/2012  . Difficulty in walking(719.7) 09/09/2011  . Osteoarthritis of leg 09/09/2011  . Abnormality of gait 09/09/2011  . Pain in joint, lower leg 09/09/2011    CMP     Component Value Date/Time   NA 137 03/17/2019 1500   K 3.9 03/17/2019 1500   CL 104 03/17/2019 1500   CO2 23 03/17/2019 1500   GLUCOSE 156 (H) 03/17/2019 1500   BUN 36 (H) 03/17/2019 1500   CREATININE 1.06 (H) 03/17/2019 1500   CREATININE 0.93 (H) 12/04/2016 1348   CALCIUM 9.4 03/17/2019 1500   PROT 6.7 03/17/2019 1500   ALBUMIN 3.2 (L) 03/17/2019 1500   AST 22 03/17/2019 1500   ALT 15 03/17/2019 1500   ALKPHOS 46 03/17/2019 1500   BILITOT 0.7 03/17/2019 1500   GFRNONAA 47 (L) 03/17/2019 1500   GFRAA 54 (  L) 03/17/2019 1500   Recent Labs    02/13/19 0959 02/14/19 0644 02/17/19 0600 02/18/19 0816 02/19/19 0651 02/26/19 0700 03/17/19 1500  NA 137   < > 138   < > 134* 137 137  K 2.7*   < > 4.1   < > 3.6 4.1 3.9  CL 104   < > 103   < > 98 102 104  CO2 25   < > 25   < > _0 GLUCOSE 190*   < > 114*   < > 115* 127* 156*  BUN 14   < > 15   < > 16 14 36*  CREATININE 0.70   < > 0.48   < > 0.63 0.66 1.06*  CALCIUM 8.3*   < > 8.7*   < > 8.4* 8.9 9.4  MG 1.8  --  2.1  --  1.9  --   --    < > = values in this interval not displayed.   Recent Labs    02/11/19 0241 02/18/19 0816 03/17/19 1500  AST _1 ALT 11 40 15  ALKPHOS 43 42 46  BILITOT 0.7 0.8 0.7  PROT 7.3 6.6 6.7  ALBUMIN 3.3* 2.3* 3.2*   Recent Labs    02/11/19 0241 02/12/19 0704 02/26/19 0700 03/02/19 0800 03/17/19 1500  WBC 4.2   < > 11.8* 4.0 9.1  NEUTROABS 3.1  --   --  2.3 6.7  HGB 12.9   < > 11.9* 11.5* 12.2  HCT 39.8   < > 37.2 36.8 39.5  MCV 96.4   < > 97.6 97.6 98.8  PLT 211   < > 337 286 343   < > = values in this interval not displayed.   No  results for input(s): CHOL, LDLCALC, TRIG in the last 8760 hours.  Invalid input(s): HCL No results found for: MICROALBUR Lab Results  Component Value Date   TSH 0.369 02/17/2019   Lab Results  Component Value Date   HGBA1C 6.3 (H) 02/14/2019   No results found for: CHOL, HDL, LDLCALC, LDLDIRECT, TRIG, CHOLHDL  Significant Diagnostic Results in last 30 days:  No results found.  Assessment and Plan  Defecation syncope/heme positive stool-had a stat CBC run; patient's hemoglobin 12.2 with white count 9.1 with vital signs that have returned to normal.  I have ordered a CBC for Monday 2/15-consideration for sending patient to GI with family as part of the decision.    Hennie Duos, MD

## 2019-03-22 ENCOUNTER — Encounter: Payer: Self-pay | Admitting: Adult Health

## 2019-03-22 ENCOUNTER — Non-Acute Institutional Stay (SKILLED_NURSING_FACILITY): Payer: Medicare Other | Admitting: Adult Health

## 2019-03-22 ENCOUNTER — Encounter (HOSPITAL_COMMUNITY)
Admission: RE | Admit: 2019-03-22 | Discharge: 2019-03-22 | Disposition: A | Discharge status: Home / Self Care | Payer: Medicare Other | Source: Skilled Nursing Facility | Attending: *Deleted | Admitting: *Deleted

## 2019-03-22 DIAGNOSIS — F339 Major depressive disorder, recurrent, unspecified: Secondary | ICD-10-CM

## 2019-03-22 DIAGNOSIS — K921 Melena: Secondary | ICD-10-CM | POA: Diagnosis not present

## 2019-03-22 DIAGNOSIS — K219 Gastro-esophageal reflux disease without esophagitis: Secondary | ICD-10-CM | POA: Diagnosis not present

## 2019-03-22 DIAGNOSIS — E049 Nontoxic goiter, unspecified: Secondary | ICD-10-CM | POA: Diagnosis not present

## 2019-03-22 LAB — CBC
HCT: 36 % (ref 36.0–46.0)
Hemoglobin: 11 g/dL — ABNORMAL LOW (ref 12.0–15.0)
MCH: 30.5 pg (ref 26.0–34.0)
MCHC: 30.6 g/dL (ref 30.0–36.0)
MCV: 99.7 fL (ref 80.0–100.0)
Platelets: 265 10*3/uL (ref 150–400)
RBC: 3.61 MIL/uL — ABNORMAL LOW (ref 3.87–5.11)
RDW: 14.6 % (ref 11.5–15.5)
WBC: 5.1 10*3/uL (ref 4.0–10.5)
nRBC: 0 % (ref 0.0–0.2)

## 2019-03-22 NOTE — Progress Notes (Signed)
Location:    [facility] Room Number: 122/W Place of Service:  SNF (31)   CODE STATUS: DNR  Allergies  Allergen Reactions  . Hydrocodone-Acetaminophen Nausea And Vomiting   Chief Complaint  Patient presents with  . Medical Management of Chronic Issues         Major depression chronic:  Goiter:  GERD without esophagitis: weekly follow up for the first 30 days post hospitalization.      HPI:  She is a short term rehab patient being seen for the management of her chronic illnesses: depression; goiter; gerd. She has had a lower GI bleed with black tarry stools. Her hgb has remained stable. Due to her dementia and advanced age she is not appropriate for advanced workup. I have spoken with her son and he is in agreement with this plan of care. There are no reports of uncontrolled pain; no changes in appetite; no reports of anxiety.   Past Medical History:  Diagnosis Date  . Anxiety   . Dementia ([place])   . GERD (gastroesophageal reflux disease)   . Goiter   . Hypertension   . Osteoarthritis   . Osteoporosis   . Vitamin D insufficiency     Past Surgical History:  Procedure Laterality Date  . APPENDECTOMY    . KIDNEY STONE SURGERY    . ORIF HIP FRACTURE Right 11/20/2012   Procedure: OPEN REDUCTION INTERNAL FIXATION RIGHT HIP;  Surgeon: Sanjuana Kava, MD;  Location: [facility];  Service: Orthopedics;  Laterality: Right;  . stress fractures of legs    . TUBAL LIGATION      Social History   Socioeconomic History  . Marital status: Widowed    Spouse name: Not on file  . Number of children: Not on file  . Years of education: Not on file  . Highest education level: Not on file  Occupational History  . Not on file  Tobacco Use  . Smoking status: Never Smoker  . Smokeless tobacco: Never Used  Substance and Sexual Activity  . Alcohol use: No  . Drug use: No  . Sexual activity: Never  Other Topics Concern  . Not on file  Social History Narrative  . Not  on file   Social Determinants of Health   Financial Resource Strain:   . Difficulty of Paying Living Expenses: Not on file  Food Insecurity:   . Worried About Charity fundraiser in the Last Year: Not on file  . Ran Out of Food in the Last Year: Not on file  Transportation Needs:   . Lack of Transportation (Medical): Not on file  . Lack of Transportation (Non-Medical): Not on file  Physical Activity:   . Days of Exercise per Week: Not on file  . Minutes of Exercise per Session: Not on file  Stress:   . Feeling of Stress : Not on file  Social Connections:   . Frequency of Communication with Friends and Family: Not on file  . Frequency of Social Gatherings with Friends and Family: Not on file  . Attends Religious Services: Not on file  . Active Member of Clubs or Organizations: Not on file  . Attends Archivist Meetings: Not on file  . Marital Status: Not on file  Intimate Partner Violence:   . Fear of Current or Ex-Partner: Not on file  . Emotionally Abused: Not on file  . Physically Abused: Not on file  . Sexually Abused: Not on file   Family  History  Problem Relation Age of Onset  . Heart attack Father   . Heart disease Neg Hx       VITAL SIGNS BP (!) 105/59   Pulse 70   Temp 98.6 F (37 C) (Oral)   Resp 20   Ht 5' (1.524 m)   Wt 115 lb 6.4 oz (52.3 kg)   SpO2 96%   BMI 22.54 kg/m   Outpatient Encounter Medications as of 03/22/2019  Medication Sig  . acetaminophen (TYLENOL) 500 MG tablet Take 1,000 mg by mouth 3 (three) times daily. For pain  . Amino Acids-Protein Hydrolys (FEEDING SUPPLEMENT, PRO-STAT SUGAR FREE 64,) LIQD Take 30 mLs by mouth 3 (three) times daily with meals. due to very poor meal intake (1-25%)  . dextromethorphan-guaiFENesin (TUSSIN DM) 10-100 MG/5ML liquid Take 10 mLs by mouth every 6 (six) hours as needed for cough.  . escitalopram (LEXAPRO) 20 MG tablet Take 20 mg by mouth daily.  Marland Kitchen esomeprazole (NEXIUM) 40 MG capsule Take 40 mg  by mouth every morning.  . feeding supplement, ENSURE ENLIVE, (ENSURE ENLIVE) LIQD Take 237 mLs by mouth 3 (three) times daily between meals.  . lidocaine (LIDODERM) 5 % Place 1 patch onto the skin daily. Remove & Discard patch within 12 hours or as directed by MD  . loperamide (IMODIUM) 2 MG capsule Take 2 mg by mouth every 4 (four) hours as needed for diarrhea or loose stools.  Marland Kitchen losartan (COZAAR) 100 MG tablet Take 100 mg by mouth daily.  . mirtazapine (REMERON) 7.5 MG tablet Take 7.5 mg by mouth at bedtime.  . NON FORMULARY Soft food diet (NAS, ConCho)  . OXYGEN Inhale 3 L into the lungs continuous.  . [DISCONTINUED] NON FORMULARY Incentive Spirometer Special Instructions: while awake x 5 repetitions Every 2 Hours   No facility-administered encounter medications on file as of 03/22/2019.     SIGNIFICANT DIAGNOSTIC EXAMS   PREVIOUS  02-11-19: chest x-ray:  1. Bilateral perihilar and bibasilar streaky densities concerning for developing infiltrate. Follow-up recommended. 2. Probable small bilateral pleural effusions.  02-17-19: ct of chest/angio of chest:  Patchy bilateral ground-glass opacities, most pronounced in the right upper lobe most compatible with pneumonia.     Small bilateral pleural effusions with bilateral lower lobe atelectasis or pneumonia. Cardiomegaly, coronary artery disease. Moderate-sized hiatal hernia. Aortic Atherosclerosis   02-18-19: 2-d echo:   1. Left ventricular ejection fraction, by visual estimation, is 70 to 75%. The left ventricle has hyperdynamic function. There is no left ventricular hypertrophy.  2. Left ventricular diastolic parameters are indeterminate.  3. The left ventricle has no regional wall motion abnormalities.  4. Global right ventricle was not well visualized.The right ventricular size is not well visualized. Right vetricular wall thickness was not assessed.  5. Left atrial size was not well visualized.  6. Right atrial size was not well  visualized.  7. The mitral valve was not well visualized. No evidence of mitral valve regurgitation. No evidence of mitral stenosis.  8. The tricuspid valve is not well visualized.  9. The aortic valve was not well visualized. Aortic valve regurgitation is not visualized. Technically difficult assement, grossly there is no significant aortic stenosis. 10. The pulmonic valve was not well visualized. Pulmonic valve regurgitation is not visualized. 11. The interatrial septum was not well visualized. 12. Technically difficult and limited study  NO NEW EXAMS.    LABS REVIEWED PREVIOUS   02-11-19: wbc 4.2; hgb 12.9; hct 39.8 mcv 96.4 plt 211; glucose 112;  bun 25; creat 0.83; k+ 3.1; na++ 134; ca 8.6; liver normal albumin 3.3 blood culture: no growth 02-14-19: glucose 129; bun 16; creat 0.56; k+ 3.8; na++ 140; ca 8.6 hgb a1c 6.3 02-17-19: wbc 7.6; hgb 11.2 hct 35.9; mcv 97.6 plt 256; glucose 114; bun 15; creat 0.48; k+ 4.1; na++ 138; ca 8.7; mag 2.1; tsh 0.369 free t4: 1.18; vit B12: 251 folate 7.3 d-dimer 1.51 02-19-19: wbc 8.6; hgb 11.0; hct 34.0; mcv 94.2 plt 313; glucose 115; bun 16; creat 0.63; k+ 3.6; na++ 134; ca 8.4 mag 1.9   TODAY  02-26-19: wbc 11.8; hgb 11.9; hct 37.2; mcv 97.6 plt 337; glucose 127; bun 14; creat 0.66; k+ 4.1; na++ 137; ca 8.9;  03-02-19: wbc 4.0; hgb 11.5; hct 36.8; mcv 97.6 plt 286 03-17-19: wbc 9.1; hgb 12.2; hct 39.5; mcv 98.8 plt 343; glucose 156; bun 36; creat 1.06; k+ 3.9; na++ 137; ca 9.4 liver normal albumin 3.2 + guaiac 03-21-19: + guaiac  03-22-19: wbc 5.1; hgb 11.0; hct 36.0; mcv 99.7 plt 265  Review of Systems  Unable to perform ROS: Dementia (unable to participate )   Physical Exam Constitutional:      General: She is not in acute distress.    Appearance: She is well-developed. She is not diaphoretic.  Neck:     Thyroid: No thyromegaly.  Cardiovascular:     Rate and Rhythm: Normal rate and regular rhythm.     Pulses: Normal pulses.     Heart sounds:  Normal heart sounds.  Pulmonary:     Effort: Pulmonary effort is normal. No respiratory distress.     Breath sounds: Normal breath sounds.  Abdominal:     General: Bowel sounds are normal. There is no distension.     Palpations: Abdomen is soft.     Tenderness: There is no abdominal tenderness.  Musculoskeletal:        General: Normal range of motion.     Cervical back: Neck supple.     Right lower leg: No edema.     Left lower leg: No edema.     Comments: Kyphosis   Lymphadenopathy:     Cervical: No cervical adenopathy.  Skin:    General: Skin is warm and dry.  Neurological:     Mental Status: She is alert. Mental status is at baseline.  Psychiatric:        Mood and Affect: Mood normal.     ASSESSMENT/ PLAN:  TODAY  1. Major depression chronic: is stable will continue lexapro 20 mg daily   2. Goiter: is stable tsh 0.369 will monitor   3. GERD without esophagitis: without change in status: hgb is stable from lower GI bleed will increase nexium 40 mg twice daily   PREVIOUS   4. Dementia without behavioral disturbance unspecified dementia type: weight is presently stable from 121 pounds to her current weight of 115 pounds; is off aricept due to weight loss  will continue remeron 7.5 mg nightly through 04-06-19 for appetite will monitor   5. Primary osteoarthritis multiple joints: is stable will continue tylenol 1 gm 3 times daily and has lidoderm patch  6. Essential hypertension: is stable b/p 105/69 will continue cozaar 100 mg daily   7. Protein calorie malnutrition is stable albumin 3.3 will continue supplements as directed. Weight is stable at  115 pounds.     MD is aware of resident's narcotic use and is in agreement with current plan of care. We will attempt to wean resident  as appropriate.  Ok Edwards NP [facility]  Contact [phone] Monday through Friday 8am- 5pm  After hours call [phone]

## 2019-03-23 ENCOUNTER — Encounter: Payer: Self-pay | Admitting: Adult Health

## 2019-03-23 NOTE — Progress Notes (Signed)
Location:    [facility] Room Number: 122/W Place of Service:  SNF (31)   CODE STATUS: DNR  Allergies  Allergen Reactions  . Hydrocodone-Acetaminophen Nausea And Vomiting    Chief Complaint  Patient presents with  . Acute Visit    Lower GI Bleed    HPI:    Past Medical History:  Diagnosis Date  . Anxiety   . Dementia ([place])   . GERD (gastroesophageal reflux disease)   . Goiter   . Hypertension   . Osteoarthritis   . Osteoporosis   . Vitamin D insufficiency     Past Surgical History:  Procedure Laterality Date  . APPENDECTOMY    . KIDNEY STONE SURGERY    . ORIF HIP FRACTURE Right 11/20/2012   Procedure: OPEN REDUCTION INTERNAL FIXATION RIGHT HIP;  Surgeon: Sanjuana Kava, MD;  Location: [facility];  Service: Orthopedics;  Laterality: Right;  . stress fractures of legs    . TUBAL LIGATION      Social History   Socioeconomic History  . Marital status: Widowed    Spouse name: Not on file  . Number of children: Not on file  . Years of education: Not on file  . Highest education level: Not on file  Occupational History  . Not on file  Tobacco Use  . Smoking status: Never Smoker  . Smokeless tobacco: Never Used  Substance and Sexual Activity  . Alcohol use: No  . Drug use: No  . Sexual activity: Never  Other Topics Concern  . Not on file  Social History Narrative  . Not on file   Social Determinants of Health   Financial Resource Strain:   . Difficulty of Paying Living Expenses: Not on file  Food Insecurity:   . Worried About Charity fundraiser in the Last Year: Not on file  . Ran Out of Food in the Last Year: Not on file  Transportation Needs:   . Lack of Transportation (Medical): Not on file  . Lack of Transportation (Non-Medical): Not on file  Physical Activity:   . Days of Exercise per Week: Not on file  . Minutes of Exercise per Session: Not on file  Stress:   . Feeling of Stress : Not on file  Social Connections:   .  Frequency of Communication with Friends and Family: Not on file  . Frequency of Social Gatherings with Friends and Family: Not on file  . Attends Religious Services: Not on file  . Active Member of Clubs or Organizations: Not on file  . Attends Archivist Meetings: Not on file  . Marital Status: Not on file  Intimate Partner Violence:   . Fear of Current or Ex-Partner: Not on file  . Emotionally Abused: Not on file  . Physically Abused: Not on file  . Sexually Abused: Not on file   Family History  Problem Relation Age of Onset  . Heart attack Father   . Heart disease Neg Hx       VITAL SIGNS BP 121/70   Pulse 88   Temp 98.6 F (37 C) (Oral)   Resp 20   Ht 5' (1.524 m)   Wt 115 lb 6.4 oz (52.3 kg)   SpO2 97%   BMI 22.54 kg/m   Outpatient Encounter Medications as of 03/23/2019  Medication Sig  . acetaminophen (TYLENOL) 500 MG tablet Take 1,000 mg by mouth 3 (three) times daily. For pain  . Amino Acids-Protein Hydrolys (FEEDING SUPPLEMENT,  PRO-STAT SUGAR FREE 64,) LIQD Take 30 mLs by mouth 3 (three) times daily with meals. due to very poor meal intake (1-25%)  . dextromethorphan-guaiFENesin (TUSSIN DM) 10-100 MG/5ML liquid Take 10 mLs by mouth every 6 (six) hours as needed for cough.  . escitalopram (LEXAPRO) 20 MG tablet Take 20 mg by mouth daily.  Marland Kitchen esomeprazole (NEXIUM) 40 MG capsule Take 40 mg by mouth every morning.  . feeding supplement, ENSURE ENLIVE, (ENSURE ENLIVE) LIQD Take 237 mLs by mouth 3 (three) times daily between meals.  . lidocaine (LIDODERM) 5 % Place 1 patch onto the skin daily. Remove & Discard patch within 12 hours or as directed by MD  . loperamide (IMODIUM) 2 MG capsule Take 2 mg by mouth every 4 (four) hours as needed for diarrhea or loose stools.  Marland Kitchen losartan (COZAAR) 100 MG tablet Take 100 mg by mouth daily.  . mirtazapine (REMERON) 7.5 MG tablet Take 7.5 mg by mouth at bedtime.  . NON FORMULARY Soft food diet (NAS, ConCho)  . OXYGEN Inhale  3 L into the lungs continuous.   No facility-administered encounter medications on file as of 03/23/2019.     SIGNIFICANT DIAGNOSTIC EXAMS       ASSESSMENT/ PLAN:    MD is aware of resident's narcotic use and is in agreement with current plan of care. We will attempt to wean resident as appropriate.  [name] NP [facility] Adult Medicine  Contact [phone] Monday through Friday 8am- 5pm  After hours call [phone]

## 2019-03-24 ENCOUNTER — Encounter: Payer: Self-pay | Admitting: Internal Medicine

## 2019-03-24 ENCOUNTER — Non-Acute Institutional Stay (SKILLED_NURSING_FACILITY): Payer: Medicare Other | Admitting: Internal Medicine

## 2019-03-24 DIAGNOSIS — K219 Gastro-esophageal reflux disease without esophagitis: Secondary | ICD-10-CM | POA: Diagnosis not present

## 2019-03-24 DIAGNOSIS — I10 Essential (primary) hypertension: Secondary | ICD-10-CM | POA: Diagnosis not present

## 2019-03-24 DIAGNOSIS — M8949 Other hypertrophic osteoarthropathy, multiple sites: Secondary | ICD-10-CM | POA: Diagnosis not present

## 2019-03-24 DIAGNOSIS — M159 Polyosteoarthritis, unspecified: Secondary | ICD-10-CM

## 2019-03-24 NOTE — Progress Notes (Signed)
Location:  [place] Room Number: 122-W Place of Service:  SNF (31)  Asencion Noble, MD  Patient Care Team: Asencion Noble, MD as PCP - General (Internal Medicine) [name], MD as PCP - Cardiology (Cardiology)  Extended Emergency Contact Information Primary Emergency Contact: Loren, Vicens Mobile Phone: [phone] Relation: Son Secondary Emergency Contact: Mccorkel,Greg Address: [address]          [place], [place] [address] [name] of [place] Phone: [phone] Mobile Phone: [phone] Relation: Son    Allergies: Hydrocodone-acetaminophen  Chief Complaint  Patient presents with  . Medical Management of Chronic Issues    Routine [place] visit    HPI: Patient is an 86 y.o. female who is being seen for routine issues of hypertension, GERD, and osteoarthritis.  Past Medical History:  Diagnosis Date  . Anxiety   . Dementia ([place])   . GERD (gastroesophageal reflux disease)   . Goiter   . Hypertension   . Osteoarthritis   . Osteoporosis   . Vitamin D insufficiency     Past Surgical History:  Procedure Laterality Date  . APPENDECTOMY    . KIDNEY STONE SURGERY    . ORIF HIP FRACTURE Right 11/20/2012   Procedure: OPEN REDUCTION INTERNAL FIXATION RIGHT HIP;  Surgeon: Sanjuana Kava, MD;  Location: [facility];  Service: Orthopedics;  Laterality: Right;  . stress fractures of legs    . TUBAL LIGATION      Allergies as of 03/24/2019      Reactions   Hydrocodone-acetaminophen Nausea And Vomiting      Medication List    Notice   This visit is during an admission. Changes to the med list made in this visit will be reflected in the After Visit Summary of the admission.    Current Outpatient Medications on File Prior to Visit  Medication Sig Dispense Refill  . acetaminophen (TYLENOL) 500 MG tablet Take 1,000 mg by mouth 3 (three) times daily. For pain    . Amino Acids-Protein Hydrolys (FEEDING SUPPLEMENT, PRO-STAT SUGAR  FREE 64,) LIQD Take 30 mLs by mouth 3 (three) times daily with meals. due to very poor meal intake (1-25%)    . dextromethorphan-guaiFENesin (TUSSIN DM) 10-100 MG/5ML liquid Take 10 mLs by mouth every 6 (six) hours as needed for cough.    . escitalopram (LEXAPRO) 20 MG tablet Take 20 mg by mouth daily.    Marland Kitchen esomeprazole (NEXIUM) 40 MG capsule Take 40 mg by mouth every morning.    . feeding supplement, ENSURE ENLIVE, (ENSURE ENLIVE) LIQD Take 237 mLs by mouth 3 (three) times daily between meals. 237 mL 12  . lidocaine (LIDODERM) 5 % Place 1 patch onto the skin daily. Remove & Discard patch within 12 hours or as directed by MD 30 patch 0  . loperamide (IMODIUM) 2 MG capsule Take 2 mg by mouth every 4 (four) hours as needed for diarrhea or loose stools.    Marland Kitchen losartan (COZAAR) 100 MG tablet Take 100 mg by mouth daily.    . mirtazapine (REMERON) 7.5 MG tablet Take 7.5 mg by mouth at bedtime.    . NON FORMULARY Soft food diet (NAS, ConCho)    . OXYGEN Inhale 3 L into the lungs continuous.     No current facility-administered medications on file prior to visit.     No orders of the defined types were placed in this encounter.   Immunization History  Administered Date(s) Administered  . Influenza-Unspecified 11/26/2018  .  MMR 04/06/2015  . Pneumococcal Conjugate-13 04/06/2015  . Pneumococcal-Unspecified 11/16/2001  . Tdap 03/08/2011    Social History   Tobacco Use  . Smoking status: Never Smoker  . Smokeless tobacco: Never Used  Substance Use Topics  . Alcohol use: No    Review of Systems   unable to obtain secondary dementia     Vitals:   03/24/19 1100  BP: (!) 105/59  Pulse: 70  Resp: 20  Temp: 98.2 F (36.8 C)  SpO2: 90%   Body mass index is 22.54 kg/m. Physical Exam  GENERAL APPEARANCE: Alert, No acute distress  SKIN: No diaphoresis rash HEENT: Unremarkable RESPIRATORY: Breathing is even, unlabored. Lung sounds are clear   CARDIOVASCULAR: Heart RRR no murmurs,  rubs or gallops. No peripheral edema  GASTROINTESTINAL: Abdomen is soft, non-tender, not distended w/ normal bowel sounds.  GENITOURINARY: Bladder non tender, not distended  MUSCULOSKELETAL: No abnormal joints or musculature NEUROLOGIC: Cranial nerves 2-12 grossly intact. Moves all extremities PSYCHIATRIC: Dementia, no behavioral issues  Patient Active Problem List   Diagnosis Date Noted  . Vasovagal near syncope 03/21/2019  . Osteoarthritis 02/22/2019  . GERD without esophagitis 02/22/2019  . Protein-calorie malnutrition ([place]) 02/22/2019  . Major depression, recurrent, chronic ([place]) 02/22/2019  . Acute metabolic encephalopathy 32/95/1884  . Acute respiratory failure with hypoxia ([place]) 02/17/2019  . Failure to thrive in adult   . Hypokalemia 02/14/2019  . Weakness 02/14/2019  . Hypoxia 02/14/2019  . Dehydration 02/14/2019  . Metabolic encephalopathy 16/60/6301  . DNR (do not resuscitate) 02/14/2019  . Goals of care, counseling/discussion 02/14/2019  . Comfort measures only status 02/14/2019  . Dementia ([place]) 02/12/2019  . Community acquired bilateral lower lobe pneumonia 02/11/2019  . Chest pain 05/14/2016  . Goiter 05/14/2016  . Intertrochanteric fracture of right hip ([place]) 11/19/2012  . UTI (urinary tract infection) 11/19/2012  . Dizziness and giddiness 06/14/2012  . HTN (hypertension) 06/14/2012  . Difficulty in walking(719.7) 09/09/2011  . Osteoarthritis of leg 09/09/2011  . Abnormality of gait 09/09/2011  . Pain in joint, lower leg 09/09/2011    CMP     Component Value Date/Time   NA 137 03/17/2019 1500   K 3.9 03/17/2019 1500   CL 104 03/17/2019 1500   CO2 23 03/17/2019 1500   GLUCOSE 156 (H) 03/17/2019 1500   BUN 36 (H) 03/17/2019 1500   CREATININE 1.06 (H) 03/17/2019 1500   CREATININE 0.93 (H) 12/04/2016 1348   CALCIUM 9.4 03/17/2019 1500   PROT 6.7 03/17/2019 1500   ALBUMIN 3.2 (L) 03/17/2019 1500   AST 22 03/17/2019 1500   ALT 15 03/17/2019 1500    ALKPHOS 46 03/17/2019 1500   BILITOT 0.7 03/17/2019 1500   GFRNONAA 47 (L) 03/17/2019 1500   GFRAA 54 (L) 03/17/2019 1500   Recent Labs    02/13/19 0959 02/14/19 0644 02/17/19 0600 02/18/19 0816 02/19/19 0651 02/26/19 0700 03/17/19 1500  NA 137   < > 138   < > 134* 137 137  K 2.7*   < > 4.1   < > 3.6 4.1 3.9  CL 104   < > 103   < > 98 102 104  CO2 25   < > 25   < > '25 25 23  '$ GLUCOSE 190*   < > 114*   < > 115* 127* 156*  BUN 14   < > 15   < > 16 14 36*  CREATININE 0.70   < > 0.48   < > 0.63 0.66  1.06*  CALCIUM 8.3*   < > 8.7*   < > 8.4* 8.9 9.4  MG 1.8  --  2.1  --  1.9  --   --    < > = values in this interval not displayed.   Recent Labs    02/11/19 0241 02/18/19 0816 03/17/19 1500  AST '19 31 22  '$ ALT 11 40 15  ALKPHOS 43 42 46  BILITOT 0.7 0.8 0.7  PROT 7.3 6.6 6.7  ALBUMIN 3.3* 2.3* 3.2*   Recent Labs    02/11/19 0241 02/12/19 0704 03/02/19 0800 03/17/19 1500 03/22/19 0604  WBC 4.2   < > 4.0 9.1 5.1  NEUTROABS 3.1  --  2.3 6.7  --   HGB 12.9   < > 11.5* 12.2 11.0*  HCT 39.8   < > 36.8 39.5 36.0  MCV 96.4   < > 97.6 98.8 99.7  PLT 211   < > 286 343 265   < > = values in this interval not displayed.   No results for input(s): CHOL, LDLCALC, TRIG in the last 8760 hours.  Invalid input(s): HCL No results found for: MICROALBUR Lab Results  Component Value Date   TSH 0.369 02/17/2019   Lab Results  Component Value Date   HGBA1C 6.3 (H) 02/14/2019   No results found for: CHOL, HDL, LDLCALC, LDLDIRECT, TRIG, CHOLHDL  Significant Diagnostic Results in last 30 days:  No results found.  Assessment and Plan  HTN (hypertension) Controlled; continue Cozaar 100 mg daily  GERD without esophagitis Stable; continue Nexium 40 mg daily  Osteoarthritis Patient controlled; continue Tylenol 1000 mg 3 times a day    Hennie Duos, MD

## 2019-03-25 ENCOUNTER — Other Ambulatory Visit (HOSPITAL_COMMUNITY)
Admission: AD | Admit: 2019-03-25 | Discharge: 2019-03-25 | Disposition: A | Discharge status: Home / Self Care | Payer: Medicare Other | Source: Skilled Nursing Facility | Attending: Internal Medicine | Admitting: Internal Medicine

## 2019-03-25 DIAGNOSIS — K921 Melena: Secondary | ICD-10-CM | POA: Insufficient documentation

## 2019-03-25 LAB — OCCULT BLOOD X 1 CARD TO LAB, STOOL
Fecal Occult Bld: NEGATIVE
Fecal Occult Bld: NEGATIVE
Fecal Occult Bld: NEGATIVE

## 2019-03-26 NOTE — Progress Notes (Signed)
This encounter was created in error - please disregard.

## 2019-03-28 ENCOUNTER — Encounter: Payer: Self-pay | Admitting: Internal Medicine

## 2019-03-28 NOTE — Assessment & Plan Note (Signed)
Patient controlled; continue Tylenol 1000 mg 3 times a day

## 2019-03-28 NOTE — Assessment & Plan Note (Signed)
Stable; continue Nexium 40 mg daily

## 2019-03-28 NOTE — Assessment & Plan Note (Signed)
Controlled; continue Cozaar 100 mg daily

## 2019-04-20 ENCOUNTER — Non-Acute Institutional Stay (SKILLED_NURSING_FACILITY): Payer: Medicare Other | Admitting: Adult Health

## 2019-04-20 ENCOUNTER — Encounter: Payer: Self-pay | Admitting: Adult Health

## 2019-04-20 DIAGNOSIS — F0390 Unspecified dementia without behavioral disturbance: Secondary | ICD-10-CM | POA: Diagnosis not present

## 2019-04-20 DIAGNOSIS — I10 Essential (primary) hypertension: Secondary | ICD-10-CM

## 2019-04-20 DIAGNOSIS — M8949 Other hypertrophic osteoarthropathy, multiple sites: Secondary | ICD-10-CM | POA: Diagnosis not present

## 2019-04-20 DIAGNOSIS — M159 Polyosteoarthritis, unspecified: Secondary | ICD-10-CM

## 2019-04-20 NOTE — Progress Notes (Addendum)
Location:    [facility] Room Number: 122/W Place of Service:  SNF (31)   CODE STATUS: DNR  Allergies  Allergen Reactions  . Hydrocodone-Acetaminophen Nausea And Vomiting    Chief Complaint  Patient presents with  . Medical Management of Chronic Issues       Dementia without behavioral disturbance unspecified dementia type     Primary osteoarthritis multiple joints  Essential hypertension:    HPI:  She is a 86 year old long term resident of this facility being seen for the management of her chronic illnesses: dementia; osteoarthritis; hypertension. There are no reports of uncontrolled pain; no changes in appetite no reports of agitation or anxiety.   Past Medical History:  Diagnosis Date  . Anxiety   . Dementia (HCC)   . GERD (gastroesophageal reflux disease)   . Goiter   . Hypertension   . Osteoarthritis   . Osteoporosis   . Vitamin D insufficiency     Past Surgical History:  Procedure Laterality Date  . APPENDECTOMY    . KIDNEY STONE SURGERY    . ORIF HIP FRACTURE Right 11/20/2012   Procedure: OPEN REDUCTION INTERNAL FIXATION RIGHT HIP;  Surgeon: Darreld Mclean, MD;  Location: [facility];  Service: Orthopedics;  Laterality: Right;  . stress fractures of legs    . TUBAL LIGATION      Social History   Socioeconomic History  . Marital status: Widowed    Spouse name: Not on file  . Number of children: Not on file  . Years of education: Not on file  . Highest education level: Not on file  Occupational History  . Not on file  Tobacco Use  . Smoking status: Never Smoker  . Smokeless tobacco: Never Used  Substance and Sexual Activity  . Alcohol use: No  . Drug use: No  . Sexual activity: Never  Other Topics Concern  . Not on file  Social History Narrative  . Not on file   Social Determinants of Health   Financial Resource Strain:   . Difficulty of Paying Living Expenses:   Food Insecurity:   . Worried About Programme researcher, broadcasting/film/video in the  Last Year:   . Barista in the Last Year:   Transportation Needs:   . Freight forwarder (Medical):   Marland Kitchen Lack of Transportation (Non-Medical):   Physical Activity:   . Days of Exercise per Week:   . Minutes of Exercise per Session:   Stress:   . Feeling of Stress :   Social Connections:   . Frequency of Communication with Friends and Family:   . Frequency of Social Gatherings with Friends and Family:   . Attends Religious Services:   . Active Member of Clubs or Organizations:   . Attends Banker Meetings:   Marland Kitchen Marital Status:   Intimate Partner Violence:   . Fear of Current or Ex-Partner:   . Emotionally Abused:   Marland Kitchen Physically Abused:   . Sexually Abused:    Family History  Problem Relation Age of Onset  . Heart attack Father   . Heart disease Neg Hx       VITAL SIGNS BP (!) 105/53   Pulse 64   Temp 98.1 F (36.7 C) (Oral)   Resp 18   Ht 5' (1.524 m)   Wt 115 lb 12.8 oz (52.5 kg)   SpO2 94%   BMI 22.62 kg/m   Outpatient Encounter Medications as of 04/20/2019  Medication Sig  . acetaminophen (TYLENOL) 500 MG tablet Take 1,000 mg by mouth 3 (three) times daily. For pain  . Amino Acids-Protein Hydrolys (FEEDING SUPPLEMENT, PRO-STAT SUGAR FREE 64,) LIQD Take 30 mLs by mouth 3 (three) times daily with meals. due to very poor meal intake (1-25%)  . dextromethorphan-guaiFENesin (TUSSIN DM) 10-100 MG/5ML liquid Take 10 mLs by mouth every 6 (six) hours as needed for cough.  . escitalopram (LEXAPRO) 20 MG tablet Take 20 mg by mouth daily.  Marland Kitchen esomeprazole (NEXIUM) 40 MG capsule Take 40 mg by mouth 2 (two) times daily before a meal.   . feeding supplement, ENSURE ENLIVE, (ENSURE ENLIVE) LIQD Take 237 mLs by mouth 3 (three) times daily between meals.  Marland Kitchen loperamide (IMODIUM) 2 MG capsule Take 2 mg by mouth every 4 (four) hours as needed for diarrhea or loose stools.  Marland Kitchen losartan (COZAAR) 100 MG tablet Take 100 mg by mouth daily.  . NON FORMULARY Soft food  diet (NAS, ConCho)  . OXYGEN Inhale 3 L into the lungs continuous.  . mirtazapine (REMERON) 7.5 MG tablet Take 7.5 mg by mouth at bedtime.  . [DISCONTINUED] lidocaine (LIDODERM) 5 % Place 1 patch onto the skin daily. Remove & Discard patch within 12 hours or as directed by MD   No facility-administered encounter medications on file as of 04/20/2019.     SIGNIFICANT DIAGNOSTIC EXAMS   PREVIOUS  02-11-19: chest x-ray:  1. Bilateral perihilar and bibasilar streaky densities concerning for developing infiltrate. Follow-up recommended. 2. Probable small bilateral pleural effusions.  02-17-19: ct of chest/angio of chest:  Patchy bilateral ground-glass opacities, most pronounced in the right upper lobe most compatible with pneumonia.     Small bilateral pleural effusions with bilateral lower lobe atelectasis or pneumonia. Cardiomegaly, coronary artery disease. Moderate-sized hiatal hernia. Aortic Atherosclerosis   02-18-19: 2-d echo:   1. Left ventricular ejection fraction, by visual estimation, is 70 to 75%. The left ventricle has hyperdynamic function. There is no left ventricular hypertrophy.  2. Left ventricular diastolic parameters are indeterminate.  3. The left ventricle has no regional wall motion abnormalities.  4. Global right ventricle was not well visualized.The right ventricular size is not well visualized. Right vetricular wall thickness was not assessed.  5. Left atrial size was not well visualized.  6. Right atrial size was not well visualized.  7. The mitral valve was not well visualized. No evidence of mitral valve regurgitation. No evidence of mitral stenosis.  8. The tricuspid valve is not well visualized.  9. The aortic valve was not well visualized. Aortic valve regurgitation is not visualized. Technically difficult assement, grossly there is no significant aortic stenosis. 10. The pulmonic valve was not well visualized. Pulmonic valve regurgitation is not  visualized. 11. The interatrial septum was not well visualized. 12. Technically difficult and limited study  NO NEW EXAMS.    LABS REVIEWED PREVIOUS   02-11-19: wbc 4.2; hgb 12.9; hct 39.8 mcv 96.4 plt 211; glucose 112; bun 25; creat 0.83; k+ 3.1; na++ 134; ca 8.6; liver normal albumin 3.3 blood culture: no growth 02-14-19: glucose 129; bun 16; creat 0.56; k+ 3.8; na++ 140; ca 8.6 hgb a1c 6.3 02-17-19: wbc 7.6; hgb 11.2 hct 35.9; mcv 97.6 plt 256; glucose 114; bun 15; creat 0.48; k+ 4.1; na++ 138; ca 8.7; mag 2.1; tsh 0.369 free t4: 1.18; vit B12: 251 folate 7.3 d-dimer 1.51 02-19-19: wbc 8.6; hgb 11.0; hct 34.0; mcv 94.2 plt 313; glucose 115; bun 16; creat 0.63; k+  3.6; na++ 134; ca 8.4 mag 1.9  02-26-19: wbc 11.8; hgb 11.9; hct 37.2; mcv 97.6 plt 337; glucose 127; bun 14; creat 0.66; k+ 4.1; na++ 137; ca 8.9;  03-02-19: wbc 4.0; hgb 11.5; hct 36.8; mcv 97.6 plt 286 03-17-19: wbc 9.1; hgb 12.2; hct 39.5; mcv 98.8 plt 343; glucose 156; bun 36; creat 1.06; k+ 3.9; na++ 137; ca 9.4 liver normal albumin 3.2 + guaiac 03-21-19: + guaiac  03-22-19: wbc 5.1; hgb 11.0; hct 36.0; mcv 99.7 plt 265 03-25-19: guaiac neg X 3   NO NEW LABS.    Review of Systems  Unable to perform ROS: Dementia (unable to participate )   Physical Exam Constitutional:      General: She is not in acute distress.    Appearance: She is well-developed. She is not diaphoretic.  Neck:     Thyroid: Thyroid mass and thyromegaly present.  Cardiovascular:     Rate and Rhythm: Normal rate and regular rhythm.     Pulses: Normal pulses.     Heart sounds: Normal heart sounds.  Pulmonary:     Effort: Pulmonary effort is normal. No respiratory distress.     Breath sounds: Normal breath sounds.  Abdominal:     General: Bowel sounds are normal. There is no distension.     Palpations: Abdomen is soft.     Tenderness: There is no abdominal tenderness.  Musculoskeletal:        General: Normal range of motion.     Cervical back: Neck  supple.     Right lower leg: No edema.     Left lower leg: No edema.     Comments: Kyphosis   Lymphadenopathy:     Cervical: No cervical adenopathy.  Skin:    General: Skin is warm and dry.  Neurological:     Mental Status: She is alert. Mental status is at baseline.  Psychiatric:        Mood and Affect: Mood normal.     ASSESSMENT/ PLAN:  TODAY  1. Dementia without behavioral disturbance unspecified dementia type weight is stable at 115 pounds; the aricept was stopped due to weight loss from 121 pounds;   2. Primary osteoarthritis multiple joints is stable will continue tylenol 1 gm three times daily  3. Essential hypertension: is stable b/p 105/53 will continue cozaar 100 mg daily   PREVIOUS   4. Protein calorie malnutrition is stable albumin 3.3 will continue supplements as directed. Weight is stable at  115 pounds.   5. Major depression chronic: is stable will continue lexapro 20 mg daily   6. Goiter: is stable tsh 0.369 will monitor will get follow up labs.   7. GERD without esophagitis: without change in status: hgb is stable from lower GI bleed will continue nexium 40 mg twice daily        MD is aware of resident's narcotic use and is in agreement with current plan of care. We will attempt to wean resident as appropriate.  Ok Edwards NP [facility]  Contact [phone] Monday through Friday 8am- 5pm  After hours call [phone]

## 2019-04-22 ENCOUNTER — Other Ambulatory Visit (HOSPITAL_COMMUNITY)
Admission: RE | Admit: 2019-04-22 | Discharge: 2019-04-22 | Disposition: A | Discharge status: Home / Self Care | Payer: Medicare Other | Source: Skilled Nursing Facility | Attending: Adult Health | Admitting: Adult Health

## 2019-04-22 DIAGNOSIS — E049 Nontoxic goiter, unspecified: Secondary | ICD-10-CM | POA: Insufficient documentation

## 2019-04-22 LAB — TSH: TSH: 0.317 u[IU]/mL — ABNORMAL LOW (ref 0.350–4.500)

## 2019-04-22 LAB — T4, FREE: Free T4: 0.98 ng/dL (ref 0.61–1.12)

## 2019-04-23 LAB — T3, FREE: T3, Free: 2.9 pg/mL (ref 2.0–4.4)

## 2019-05-06 DIAGNOSIS — Z23 Encounter for immunization: Secondary | ICD-10-CM | POA: Diagnosis not present

## 2019-05-11 DIAGNOSIS — Z1159 Encounter for screening for other viral diseases: Secondary | ICD-10-CM | POA: Diagnosis not present

## 2019-05-11 DIAGNOSIS — J9601 Acute respiratory failure with hypoxia: Secondary | ICD-10-CM | POA: Diagnosis not present

## 2019-05-26 ENCOUNTER — Non-Acute Institutional Stay (SKILLED_NURSING_FACILITY): Payer: Medicare Other | Admitting: Adult Health

## 2019-05-26 ENCOUNTER — Encounter: Payer: Self-pay | Admitting: Adult Health

## 2019-05-26 DIAGNOSIS — E440 Moderate protein-calorie malnutrition: Secondary | ICD-10-CM | POA: Diagnosis not present

## 2019-05-26 DIAGNOSIS — F339 Major depressive disorder, recurrent, unspecified: Secondary | ICD-10-CM | POA: Diagnosis not present

## 2019-05-26 DIAGNOSIS — E049 Nontoxic goiter, unspecified: Secondary | ICD-10-CM | POA: Diagnosis not present

## 2019-05-26 NOTE — Progress Notes (Signed)
Location:    [facility] Room Number: 122/W Place of Service:  SNF (31)   CODE STATUS: DNR  Allergies  Allergen Reactions  . Hydrocodone-Acetaminophen Nausea And Vomiting    Chief Complaint  Patient presents with  . Medical Management of Chronic Issues         Protein calorie malnutrition:   Major depression chronic   Goiter:     HPI:  She is a 86 year old long term resident of this facility being seen for the management of her chronic illnesses; goiter; malnutrition; depression. There are no reports of anxiety or agitation; no reports of uncontrolled pain; she is slowly losing weight.   Past Medical History:  Diagnosis Date  . Anxiety   . Dementia (HCC)   . GERD (gastroesophageal reflux disease)   . Goiter   . Hypertension   . Osteoarthritis   . Osteoporosis   . Vitamin D insufficiency     Past Surgical History:  Procedure Laterality Date  . APPENDECTOMY    . KIDNEY STONE SURGERY    . ORIF HIP FRACTURE Right 11/20/2012   Procedure: OPEN REDUCTION INTERNAL FIXATION RIGHT HIP;  Surgeon: Darreld Mclean, MD;  Location: [facility];  Service: Orthopedics;  Laterality: Right;  . stress fractures of legs    . TUBAL LIGATION      Social History   Socioeconomic History  . Marital status: Widowed    Spouse name: Not on file  . Number of children: Not on file  . Years of education: Not on file  . Highest education level: Not on file  Occupational History  . Not on file  Tobacco Use  . Smoking status: Never Smoker  . Smokeless tobacco: Never Used  Substance and Sexual Activity  . Alcohol use: No  . Drug use: No  . Sexual activity: Never  Other Topics Concern  . Not on file  Social History Narrative  . Not on file   Social Determinants of Health   Financial Resource Strain:   . Difficulty of Paying Living Expenses:   Food Insecurity:   . Worried About Programme researcher, broadcasting/film/video in the Last Year:   . Barista in the Last Year:     Transportation Needs:   . Freight forwarder (Medical):   Marland Kitchen Lack of Transportation (Non-Medical):   Physical Activity:   . Days of Exercise per Week:   . Minutes of Exercise per Session:   Stress:   . Feeling of Stress :   Social Connections:   . Frequency of Communication with Friends and Family:   . Frequency of Social Gatherings with Friends and Family:   . Attends Religious Services:   . Active Member of Clubs or Organizations:   . Attends Banker Meetings:   Marland Kitchen Marital Status:   Intimate Partner Violence:   . Fear of Current or Ex-Partner:   . Emotionally Abused:   Marland Kitchen Physically Abused:   . Sexually Abused:    Family History  Problem Relation Age of Onset  . Heart attack Father   . Heart disease Neg Hx       VITAL SIGNS BP 135/72   Pulse 73   Temp 98.2 F (36.8 C) (Oral)   Resp 16   Ht 5' (1.524 m)   Wt 113 lb (51.3 kg)   SpO2 93%   BMI 22.07 kg/m   Outpatient Encounter Medications as of 05/26/2019  Medication Sig  . acetaminophen (  TYLENOL) 500 MG tablet Take 1,000 mg by mouth 3 (three) times daily. For pain  . Amino Acids-Protein Hydrolys (FEEDING SUPPLEMENT, PRO-STAT SUGAR FREE 64,) LIQD Take 30 mLs by mouth 3 (three) times daily with meals. due to very poor meal intake (1-25%)  . dextromethorphan-guaiFENesin (TUSSIN DM) 10-100 MG/5ML liquid Take 10 mLs by mouth every 6 (six) hours as needed for cough.  . escitalopram (LEXAPRO) 20 MG tablet Take 20 mg by mouth daily.  Marland Kitchen esomeprazole (NEXIUM) 40 MG capsule Take 40 mg by mouth 2 (two) times daily before a meal.   . feeding supplement, ENSURE ENLIVE, (ENSURE ENLIVE) LIQD Take 237 mLs by mouth 3 (three) times daily between meals.  Marland Kitchen loperamide (IMODIUM) 2 MG capsule Take 2 mg by mouth every 4 (four) hours as needed for diarrhea or loose stools.  Marland Kitchen losartan (COZAAR) 100 MG tablet Take 100 mg by mouth daily.  . magnesium hydroxide (MILK OF MAGNESIA) 400 MG/5ML suspension Take 30 mLs by mouth daily  as needed (For Constipation).  . mirtazapine (REMERON) 7.5 MG tablet Take 7.5 mg by mouth at bedtime.  . NON FORMULARY Ground meat with gravy and soft food diet (NAS, on CHO)  . OXYGEN Inhale 3 L into the lungs continuous.  . [DISCONTINUED] mirtazapine (REMERON) 7.5 MG tablet Take 7.5 mg by mouth at bedtime.   No facility-administered encounter medications on file as of 05/26/2019.     SIGNIFICANT DIAGNOSTIC EXAMS   PREVIOUS  02-11-19: chest x-ray:  1. Bilateral perihilar and bibasilar streaky densities concerning for developing infiltrate. Follow-up recommended. 2. Probable small bilateral pleural effusions.  02-17-19: ct of chest/angio of chest:  Patchy bilateral ground-glass opacities, most pronounced in the right upper lobe most compatible with pneumonia.     Small bilateral pleural effusions with bilateral lower lobe atelectasis or pneumonia. Cardiomegaly, coronary artery disease. Moderate-sized hiatal hernia. Aortic Atherosclerosis   02-18-19: 2-d echo:   1. Left ventricular ejection fraction, by visual estimation, is 70 to 75%. The left ventricle has hyperdynamic function. There is no left ventricular hypertrophy.  2. Left ventricular diastolic parameters are indeterminate.  3. The left ventricle has no regional wall motion abnormalities.  4. Global right ventricle was not well visualized.The right ventricular size is not well visualized. Right vetricular wall thickness was not assessed.  5. Left atrial size was not well visualized.  6. Right atrial size was not well visualized.  7. The mitral valve was not well visualized. No evidence of mitral valve regurgitation. No evidence of mitral stenosis.  8. The tricuspid valve is not well visualized.  9. The aortic valve was not well visualized. Aortic valve regurgitation is not visualized. Technically difficult assement, grossly there is no significant aortic stenosis. 10. The pulmonic valve was not well visualized. Pulmonic valve  regurgitation is not visualized. 11. The interatrial septum was not well visualized. 12. Technically difficult and limited study  NO NEW EXAMS.    LABS REVIEWED PREVIOUS   02-11-19: wbc 4.2; hgb 12.9; hct 39.8 mcv 96.4 plt 211; glucose 112; bun 25; creat 0.83; k+ 3.1; na++ 134; ca 8.6; liver normal albumin 3.3 blood culture: no growth 02-14-19: glucose 129; bun 16; creat 0.56; k+ 3.8; na++ 140; ca 8.6 hgb a1c 6.3 02-17-19: wbc 7.6; hgb 11.2 hct 35.9; mcv 97.6 plt 256; glucose 114; bun 15; creat 0.48; k+ 4.1; na++ 138; ca 8.7; mag 2.1; tsh 0.369 free t4: 1.18; vit B12: 251 folate 7.3 d-dimer 1.51 02-19-19: wbc 8.6; hgb 11.0; hct 34.0; mcv  94.2 plt 313; glucose 115; bun 16; creat 0.63; k+ 3.6; na++ 134; ca 8.4 mag 1.9  02-26-19: wbc 11.8; hgb 11.9; hct 37.2; mcv 97.6 plt 337; glucose 127; bun 14; creat 0.66; k+ 4.1; na++ 137; ca 8.9;  03-02-19: wbc 4.0; hgb 11.5; hct 36.8; mcv 97.6 plt 286 03-17-19: wbc 9.1; hgb 12.2; hct 39.5; mcv 98.8 plt 343; glucose 156; bun 36; creat 1.06; k+ 3.9; na++ 137; ca 9.4 liver normal albumin 3.2 + guaiac 03-21-19: + guaiac  03-22-19: wbc 5.1; hgb 11.0; hct 36.0; mcv 99.7 plt 265 03-25-19: guaiac neg X 3   TODAY  04-22-19: tsh 0.317 free T3: 2.9 free T4; 0.98   Review of Systems  Unable to perform ROS: Dementia (unable to participate )    Physical Exam Constitutional:      General: She is not in acute distress.    Appearance: She is not diaphoretic.     Comments: thin  Neck:     Thyroid: Thyroid mass and thyromegaly present.  Cardiovascular:     Rate and Rhythm: Normal rate and regular rhythm.     Heart sounds: Normal heart sounds.  Pulmonary:     Effort: Pulmonary effort is normal. No respiratory distress.     Breath sounds: Normal breath sounds.  Abdominal:     General: Bowel sounds are normal. There is no distension.     Palpations: Abdomen is soft.     Tenderness: There is no abdominal tenderness.  Musculoskeletal:        General: Normal range of  motion.     Right lower leg: No edema.     Left lower leg: No edema.     Comments: Kyphosis   Lymphadenopathy:     Cervical: No cervical adenopathy.  Skin:    General: Skin is warm and dry.  Neurological:     Mental Status: She is alert. Mental status is at baseline.  Psychiatric:        Mood and Affect: Mood normal.       ASSESSMENT/ PLAN:  TODAY  1. Protein calorie malnutrition: is without change albumin 3.3 will continue supplements as directed weight is 113 pounds .  2. Major depression chronic is stable will continue lexapro 20 mg daily   3. Goiter: is stable tsh 0.317 free t4: 0.98  will continue to monitor   PREVIOUS   4. Protein calorie malnutrition is stable albumin 3.3 will continue supplements as directed. Weight is 113 pounds.   5. Major depression chronic: is stable will continue lexapro 20 mg daily   6. Goiter: is stable tsh 0.369 will monitor will get follow up labs.   7. GERD without esophagitis: without change in status: hgb is stable from lower GI bleed will continue nexium 40 mg twice daily    1. Dementia without behavioral disturbance unspecified dementia type weight is stable at 113 pounds; the aricept was stopped due to weight loss from 121 pounds; weight loss is an unfortunate outcome in the late stages of disease process.   2. Primary osteoarthritis multiple joints is stable will continue tylenol 1 gm three times daily  3. Essential hypertension: is stable b/p 135/72 will continue cozaar 100 mg daily          MD is aware of resident's narcotic use and is in agreement with current plan of care. We will attempt to wean resident as appropriate.  Ok Edwards NP [facility]  Contact [phone] Monday through Friday 8am- 5pm  After  hours call [phone]

## 2019-06-02 DIAGNOSIS — R279 Unspecified lack of coordination: Secondary | ICD-10-CM | POA: Diagnosis not present

## 2019-06-02 DIAGNOSIS — J9601 Acute respiratory failure with hypoxia: Secondary | ICD-10-CM | POA: Diagnosis not present

## 2019-06-02 DIAGNOSIS — Z1159 Encounter for screening for other viral diseases: Secondary | ICD-10-CM | POA: Diagnosis not present

## 2019-06-02 DIAGNOSIS — M1990 Unspecified osteoarthritis, unspecified site: Secondary | ICD-10-CM | POA: Diagnosis not present

## 2019-06-02 DIAGNOSIS — Z23 Encounter for immunization: Secondary | ICD-10-CM | POA: Diagnosis not present

## 2019-06-02 DIAGNOSIS — M6281 Muscle weakness (generalized): Secondary | ICD-10-CM | POA: Diagnosis not present

## 2019-06-03 ENCOUNTER — Encounter: Payer: Self-pay | Admitting: Adult Health

## 2019-06-03 ENCOUNTER — Other Ambulatory Visit (HOSPITAL_COMMUNITY)
Admission: RE | Admit: 2019-06-03 | Discharge: 2019-06-03 | Disposition: A | Discharge status: Home / Self Care | Payer: Medicare Other | Source: Skilled Nursing Facility | Attending: Adult Health | Admitting: Adult Health

## 2019-06-03 ENCOUNTER — Non-Acute Institutional Stay (SKILLED_NURSING_FACILITY): Payer: Medicare Other | Admitting: Adult Health

## 2019-06-03 DIAGNOSIS — F339 Major depressive disorder, recurrent, unspecified: Secondary | ICD-10-CM | POA: Diagnosis not present

## 2019-06-03 DIAGNOSIS — G9341 Metabolic encephalopathy: Secondary | ICD-10-CM | POA: Insufficient documentation

## 2019-06-03 DIAGNOSIS — R627 Adult failure to thrive: Secondary | ICD-10-CM | POA: Diagnosis not present

## 2019-06-03 DIAGNOSIS — F0390 Unspecified dementia without behavioral disturbance: Secondary | ICD-10-CM

## 2019-06-03 LAB — CBC WITH DIFFERENTIAL/PLATELET
Abs Immature Granulocytes: 0.02 10*3/uL (ref 0.00–0.07)
Basophils Absolute: 0 10*3/uL (ref 0.0–0.1)
Basophils Relative: 0 %
Eosinophils Absolute: 0 10*3/uL (ref 0.0–0.5)
Eosinophils Relative: 0 %
HCT: 34.2 % — ABNORMAL LOW (ref 36.0–46.0)
Hemoglobin: 10.8 g/dL — ABNORMAL LOW (ref 12.0–15.0)
Immature Granulocytes: 0 %
Lymphocytes Relative: 10 %
Lymphs Abs: 0.6 10*3/uL — ABNORMAL LOW (ref 0.7–4.0)
MCH: 29.6 pg (ref 26.0–34.0)
MCHC: 31.6 g/dL (ref 30.0–36.0)
MCV: 93.7 fL (ref 80.0–100.0)
Monocytes Absolute: 0.5 10*3/uL (ref 0.1–1.0)
Monocytes Relative: 7 %
Neutro Abs: 5.2 10*3/uL (ref 1.7–7.7)
Neutrophils Relative %: 83 %
Platelets: 185 10*3/uL (ref 150–400)
RBC: 3.65 MIL/uL — ABNORMAL LOW (ref 3.87–5.11)
RDW: 14.5 % (ref 11.5–15.5)
WBC: 6.3 10*3/uL (ref 4.0–10.5)
nRBC: 0 % (ref 0.0–0.2)

## 2019-06-03 LAB — BASIC METABOLIC PANEL
Anion gap: 11 (ref 5–15)
BUN: 21 mg/dL (ref 8–23)
CO2: 24 mmol/L (ref 22–32)
Calcium: 9 mg/dL (ref 8.9–10.3)
Chloride: 105 mmol/L (ref 98–111)
Creatinine, Ser: 0.67 mg/dL (ref 0.44–1.00)
GFR calc Af Amer: >60 mL/min (ref >60)
GFR calc non Af Amer: >60 mL/min (ref >60)
Glucose, Bld: 117 mg/dL — ABNORMAL HIGH (ref 70–99)
Potassium: 3.7 mmol/L (ref 3.5–5.1)
Sodium: 140 mmol/L (ref 135–145)

## 2019-06-03 NOTE — Progress Notes (Signed)
Location:    [place] Room Number: 122/W Place of Service:  SNF (31)   CODE STATUS: DNR  Allergies  Allergen Reactions  . Hydrocodone-Acetaminophen Nausea And Vomiting    Chief Complaint  Patient presents with  . Acute Visit    Care Plan Meeting     HPI:  We have come together for a care plan meeting. Unable to do BIMS; mood 9/30. There are no reports of falls. She is incontinent of bowel and bladder and required extensive assist with adls. She is slowly losing weight with her current weight at 113 pounds. The remeron has not been effective in helping with her appetite. She will continue to be followed for her chronic illnesses including: failure to thrive; dementia; depression.   Past Medical History:  Diagnosis Date  . Anxiety   . Dementia ([place])   . GERD (gastroesophageal reflux disease)   . Goiter   . Hypertension   . Osteoarthritis   . Osteoporosis   . Vitamin D insufficiency     Past Surgical History:  Procedure Laterality Date  . APPENDECTOMY    . KIDNEY STONE SURGERY    . ORIF HIP FRACTURE Right 11/20/2012   Procedure: OPEN REDUCTION INTERNAL FIXATION RIGHT HIP;  Surgeon: Sanjuana Kava, MD;  Location: [facility];  Service: Orthopedics;  Laterality: Right;  . stress fractures of legs    . TUBAL LIGATION      Social History   Socioeconomic History  . Marital status: Widowed    Spouse name: Not on file  . Number of children: Not on file  . Years of education: Not on file  . Highest education level: Not on file  Occupational History  . Not on file  Tobacco Use  . Smoking status: Never Smoker  . Smokeless tobacco: Never Used  Substance and Sexual Activity  . Alcohol use: No  . Drug use: No  . Sexual activity: Never  Other Topics Concern  . Not on file  Social History Narrative  . Not on file   Social Determinants of Health   Financial Resource Strain:   . Difficulty of Paying Living Expenses:   Food Insecurity:   . Worried  About Charity fundraiser in the Last Year:   . Arboriculturist in the Last Year:   Transportation Needs:   . Film/video editor (Medical):   Marland Kitchen Lack of Transportation (Non-Medical):   Physical Activity:   . Days of Exercise per Week:   . Minutes of Exercise per Session:   Stress:   . Feeling of Stress :   Social Connections:   . Frequency of Communication with Friends and Family:   . Frequency of Social Gatherings with Friends and Family:   . Attends Religious Services:   . Active Member of Clubs or Organizations:   . Attends Archivist Meetings:   Marland Kitchen Marital Status:   Intimate Partner Violence:   . Fear of Current or Ex-Partner:   . Emotionally Abused:   Marland Kitchen Physically Abused:   . Sexually Abused:    Family History  Problem Relation Age of Onset  . Heart attack Father   . Heart disease Neg Hx       VITAL SIGNS BP (!) 124/59   Pulse 63   Temp (!) 100.9 F (38.3 C)   Resp 16   Ht 5' (1.524 m)   Wt 113 lb (51.3 kg)   SpO2 97%   BMI 22.07  kg/m   Outpatient Encounter Medications as of 06/03/2019  Medication Sig  . acetaminophen (TYLENOL) 500 MG tablet Take 1,000 mg by mouth 3 (three) times daily. For pain  . Amino Acids-Protein Hydrolys (FEEDING SUPPLEMENT, PRO-STAT SUGAR FREE 64,) LIQD Take 30 mLs by mouth 3 (three) times daily with meals. due to very poor meal intake (1-25%)  . dextromethorphan-guaiFENesin (TUSSIN DM) 10-100 MG/5ML liquid Take 10 mLs by mouth every 6 (six) hours as needed for cough.  . escitalopram (LEXAPRO) 20 MG tablet Take 20 mg by mouth daily.  Marland Kitchen esomeprazole (NEXIUM) 40 MG capsule Take 40 mg by mouth 2 (two) times daily before a meal.   . feeding supplement, ENSURE ENLIVE, (ENSURE ENLIVE) LIQD Take 237 mLs by mouth 3 (three) times daily between meals.  Marland Kitchen loperamide (IMODIUM) 2 MG capsule Take 2 mg by mouth every 4 (four) hours as needed for diarrhea or loose stools.  Marland Kitchen losartan (COZAAR) 100 MG tablet Take 100 mg by mouth daily.  .  magnesium hydroxide (MILK OF MAGNESIA) 400 MG/5ML suspension Take 30 mLs by mouth daily as needed (For Constipation).  . NON FORMULARY Ground meat with gravy and soft food diet (NAS, on CHO)  . OXYGEN Inhale 3 L into the lungs continuous.  . [DISCONTINUED] mirtazapine (REMERON) 7.5 MG tablet Take 7.5 mg by mouth at bedtime.   No facility-administered encounter medications on file as of 06/03/2019.     SIGNIFICANT DIAGNOSTIC EXAMS   PREVIOUS  02-11-19: chest x-ray:  1. Bilateral perihilar and bibasilar streaky densities concerning for developing infiltrate. Follow-up recommended. 2. Probable small bilateral pleural effusions.  02-17-19: ct of chest/angio of chest:  Patchy bilateral ground-glass opacities, most pronounced in the right upper lobe most compatible with pneumonia.     Small bilateral pleural effusions with bilateral lower lobe atelectasis or pneumonia. Cardiomegaly, coronary artery disease. Moderate-sized hiatal hernia. Aortic Atherosclerosis   02-18-19: 2-d echo:   1. Left ventricular ejection fraction, by visual estimation, is 70 to 75%. The left ventricle has hyperdynamic function. There is no left ventricular hypertrophy.  2. Left ventricular diastolic parameters are indeterminate.  3. The left ventricle has no regional wall motion abnormalities.  4. Global right ventricle was not well visualized.The right ventricular size is not well visualized. Right vetricular wall thickness was not assessed.  5. Left atrial size was not well visualized.  6. Right atrial size was not well visualized.  7. The mitral valve was not well visualized. No evidence of mitral valve regurgitation. No evidence of mitral stenosis.  8. The tricuspid valve is not well visualized.  9. The aortic valve was not well visualized. Aortic valve regurgitation is not visualized. Technically difficult assement, grossly there is no significant aortic stenosis. 10. The pulmonic valve was not well visualized.  Pulmonic valve regurgitation is not visualized. 11. The interatrial septum was not well visualized. 12. Technically difficult and limited study  NO NEW EXAMS.    LABS REVIEWED PREVIOUS   02-11-19: wbc 4.2; hgb 12.9; hct 39.8 mcv 96.4 plt 211; glucose 112; bun 25; creat 0.83; k+ 3.1; na++ 134; ca 8.6; liver normal albumin 3.3 blood culture: no growth 02-14-19: glucose 129; bun 16; creat 0.56; k+ 3.8; na++ 140; ca 8.6 hgb a1c 6.3 02-17-19: wbc 7.6; hgb 11.2 hct 35.9; mcv 97.6 plt 256; glucose 114; bun 15; creat 0.48; k+ 4.1; na++ 138; ca 8.7; mag 2.1; tsh 0.369 free t4: 1.18; vit B12: 251 folate 7.3 d-dimer 1.51 02-19-19: wbc 8.6; hgb 11.0; hct 34.0;  mcv 94.2 plt 313; glucose 115; bun 16; creat 0.63; k+ 3.6; na++ 134; ca 8.4 mag 1.9  02-26-19: wbc 11.8; hgb 11.9; hct 37.2; mcv 97.6 plt 337; glucose 127; bun 14; creat 0.66; k+ 4.1; na++ 137; ca 8.9;  03-02-19: wbc 4.0; hgb 11.5; hct 36.8; mcv 97.6 plt 286 03-17-19: wbc 9.1; hgb 12.2; hct 39.5; mcv 98.8 plt 343; glucose 156; bun 36; creat 1.06; k+ 3.9; na++ 137; ca 9.4 liver normal albumin 3.2 + guaiac 03-21-19: + guaiac  03-22-19: wbc 5.1; hgb 11.0; hct 36.0; mcv 99.7 plt 265 03-25-19: guaiac neg X 3  04-22-19: tsh 0.317 free T3: 2.9 free T4; 0.98  NO NEW LABS.   Review of Systems  Unable to perform ROS: Dementia (unable to participate )    Physical Exam Constitutional:      General: She is not in acute distress.    Appearance: She is well-developed. She is not diaphoretic.  Neck:     Thyroid: Thyroid mass and thyromegaly present.  Cardiovascular:     Rate and Rhythm: Normal rate and regular rhythm.     Heart sounds: Normal heart sounds.  Pulmonary:     Effort: Pulmonary effort is normal. No respiratory distress.     Breath sounds: Normal breath sounds.  Abdominal:     General: Bowel sounds are normal. There is no distension.     Palpations: Abdomen is soft.     Tenderness: There is no abdominal tenderness.  Musculoskeletal:         General: Normal range of motion.     Right lower leg: No edema.     Left lower leg: No edema.     Comments: Kyphosis   Lymphadenopathy:     Cervical: No cervical adenopathy.  Skin:    General: Skin is warm and dry.  Neurological:     Mental Status: She is alert. Mental status is at baseline.  Psychiatric:        Mood and Affect: Mood normal.       ASSESSMENT/ PLAN:  TODAY  1. Failure to thrive in adult 2. Dementia without behavioral disturbance unspecified dementia type 3. Major depression recurrent chronic  Will stop remeron as this medication is ineffective  Will continue current plan of care Will continue to monitor her status.      MD is aware of resident's narcotic use and is in agreement with current plan of care. We will attempt to wean resident as appropriate.  [name] NP [facility]  Contact [phone] Monday through Friday 8am- 5pm  After hours call [phone]

## 2019-06-04 DIAGNOSIS — J9601 Acute respiratory failure with hypoxia: Secondary | ICD-10-CM | POA: Diagnosis not present

## 2019-06-04 DIAGNOSIS — M6281 Muscle weakness (generalized): Secondary | ICD-10-CM | POA: Diagnosis not present

## 2019-06-04 DIAGNOSIS — R279 Unspecified lack of coordination: Secondary | ICD-10-CM | POA: Diagnosis not present

## 2019-06-04 DIAGNOSIS — M1990 Unspecified osteoarthritis, unspecified site: Secondary | ICD-10-CM | POA: Diagnosis not present

## 2019-06-23 ENCOUNTER — Encounter: Payer: Self-pay | Admitting: Adult Health

## 2019-06-23 ENCOUNTER — Non-Acute Institutional Stay (SKILLED_NURSING_FACILITY): Payer: Medicare Other | Admitting: Adult Health

## 2019-06-23 DIAGNOSIS — R627 Adult failure to thrive: Secondary | ICD-10-CM

## 2019-06-23 DIAGNOSIS — F0390 Unspecified dementia without behavioral disturbance: Secondary | ICD-10-CM

## 2019-06-23 NOTE — Progress Notes (Signed)
Location:    [place] Room Number: 122/W Place of Service:  SNF (31)   CODE STATUS: DNR  Allergies  Allergen Reactions  . Hydrocodone-Acetaminophen Nausea And Vomiting    Chief Complaint  Patient presents with  . Acute Visit    Appetite     HPI:  Her appetite is poor since completing her remeron therapy. Her weight has remained stable. She has tolerated the remeron without any difficulties. No reports of insomnia or excessive drowsiness during the day. Staff reports that she was eating better on this medication  Past Medical History:  Diagnosis Date  . Anxiety   . Dementia ([place])   . GERD (gastroesophageal reflux disease)   . Goiter   . Hypertension   . Osteoarthritis   . Osteoporosis   . Vitamin D insufficiency     Past Surgical History:  Procedure Laterality Date  . APPENDECTOMY    . KIDNEY STONE SURGERY    . ORIF HIP FRACTURE Right 11/20/2012   Procedure: OPEN REDUCTION INTERNAL FIXATION RIGHT HIP;  Surgeon: Sanjuana Kava, MD;  Location: [facility];  Service: Orthopedics;  Laterality: Right;  . stress fractures of legs    . TUBAL LIGATION      Social History   Socioeconomic History  . Marital status: Widowed    Spouse name: Not on file  . Number of children: Not on file  . Years of education: Not on file  . Highest education level: Not on file  Occupational History  . Not on file  Tobacco Use  . Smoking status: Never Smoker  . Smokeless tobacco: Never Used  Substance and Sexual Activity  . Alcohol use: No  . Drug use: No  . Sexual activity: Never  Other Topics Concern  . Not on file  Social History Narrative  . Not on file   Social Determinants of Health   Financial Resource Strain:   . Difficulty of Paying Living Expenses:   Food Insecurity:   . Worried About Charity fundraiser in the Last Year:   . Arboriculturist in the Last Year:   Transportation Needs:   . Film/video editor (Medical):   Marland Kitchen Lack of  Transportation (Non-Medical):   Physical Activity:   . Days of Exercise per Week:   . Minutes of Exercise per Session:   Stress:   . Feeling of Stress :   Social Connections:   . Frequency of Communication with Friends and Family:   . Frequency of Social Gatherings with Friends and Family:   . Attends Religious Services:   . Active Member of Clubs or Organizations:   . Attends Archivist Meetings:   Marland Kitchen Marital Status:   Intimate Partner Violence:   . Fear of Current or Ex-Partner:   . Emotionally Abused:   Marland Kitchen Physically Abused:   . Sexually Abused:    Family History  Problem Relation Age of Onset  . Heart attack Father   . Heart disease Neg Hx       VITAL SIGNS BP 133/73   Pulse 66   Temp 98.6 F (37 C) (Oral)   Resp 16   Ht 5' (1.524 m)   Wt 116 lb 9.6 oz (52.9 kg)   SpO2 93%   BMI 22.77 kg/m   Outpatient Encounter Medications as of 06/23/2019  Medication Sig  . acetaminophen (TYLENOL) 500 MG tablet Take 1,000 mg by mouth 3 (three) times daily. For pain  . dextromethorphan-guaiFENesin (  TUSSIN DM) 10-100 MG/5ML liquid Take 10 mLs by mouth every 6 (six) hours as needed for cough.  . escitalopram (LEXAPRO) 20 MG tablet Take 20 mg by mouth daily.  Marland Kitchen esomeprazole (NEXIUM) 40 MG capsule Take 40 mg by mouth 2 (two) times daily before a meal.   . feeding supplement, ENSURE ENLIVE, (ENSURE ENLIVE) LIQD Take 237 mLs by mouth 3 (three) times daily between meals.  Marland Kitchen loperamide (IMODIUM) 2 MG capsule Take 2 mg by mouth every 4 (four) hours as needed for diarrhea or loose stools.  Marland Kitchen losartan (COZAAR) 100 MG tablet Take 100 mg by mouth daily.  . magnesium hydroxide (MILK OF MAGNESIA) 400 MG/5ML suspension Take 30 mLs by mouth daily as needed (For Constipation).  . NON FORMULARY Mechanical soft diet with ground meat  . OXYGEN Inhale 3 L into the lungs continuous.  . [DISCONTINUED] Amino Acids-Protein Hydrolys (FEEDING SUPPLEMENT, PRO-STAT SUGAR FREE 64,) LIQD Take 30 mLs by  mouth 3 (three) times daily with meals. due to very poor meal intake (1-25%)   No facility-administered encounter medications on file as of 06/23/2019.     SIGNIFICANT DIAGNOSTIC EXAMS  PREVIOUS  02-11-19: chest x-ray:  1. Bilateral perihilar and bibasilar streaky densities concerning for developing infiltrate. Follow-up recommended. 2. Probable small bilateral pleural effusions.  02-17-19: ct of chest/angio of chest:  Patchy bilateral ground-glass opacities, most pronounced in the right upper lobe most compatible with pneumonia.     Small bilateral pleural effusions with bilateral lower lobe atelectasis or pneumonia. Cardiomegaly, coronary artery disease. Moderate-sized hiatal hernia. Aortic Atherosclerosis   02-18-19: 2-d echo:   1. Left ventricular ejection fraction, by visual estimation, is 70 to 75%. The left ventricle has hyperdynamic function. There is no left ventricular hypertrophy.  2. Left ventricular diastolic parameters are indeterminate.  3. The left ventricle has no regional wall motion abnormalities.  4. Global right ventricle was not well visualized.The right ventricular size is not well visualized. Right vetricular wall thickness was not assessed.  5. Left atrial size was not well visualized.  6. Right atrial size was not well visualized.  7. The mitral valve was not well visualized. No evidence of mitral valve regurgitation. No evidence of mitral stenosis.  8. The tricuspid valve is not well visualized.  9. The aortic valve was not well visualized. Aortic valve regurgitation is not visualized. Technically difficult assement, grossly there is no significant aortic stenosis. 10. The pulmonic valve was not well visualized. Pulmonic valve regurgitation is not visualized. 11. The interatrial septum was not well visualized. 12. Technically difficult and limited study  NO NEW EXAMS.    LABS REVIEWED PREVIOUS   02-11-19: wbc 4.2; hgb 12.9; hct 39.8 mcv 96.4 plt 211; glucose  112; bun 25; creat 0.83; k+ 3.1; na++ 134; ca 8.6; liver normal albumin 3.3 blood culture: no growth 02-14-19: glucose 129; bun 16; creat 0.56; k+ 3.8; na++ 140; ca 8.6 hgb a1c 6.3 02-17-19: wbc 7.6; hgb 11.2 hct 35.9; mcv 97.6 plt 256; glucose 114; bun 15; creat 0.48; k+ 4.1; na++ 138; ca 8.7; mag 2.1; tsh 0.369 free t4: 1.18; vit B12: 251 folate 7.3 d-dimer 1.51 02-19-19: wbc 8.6; hgb 11.0; hct 34.0; mcv 94.2 plt 313; glucose 115; bun 16; creat 0.63; k+ 3.6; na++ 134; ca 8.4 mag 1.9  02-26-19: wbc 11.8; hgb 11.9; hct 37.2; mcv 97.6 plt 337; glucose 127; bun 14; creat 0.66; k+ 4.1; na++ 137; ca 8.9;  03-02-19: wbc 4.0; hgb 11.5; hct 36.8; mcv 97.6 plt  286 03-17-19: wbc 9.1; hgb 12.2; hct 39.5; mcv 98.8 plt 343; glucose 156; bun 36; creat 1.06; k+ 3.9; na++ 137; ca 9.4 liver normal albumin 3.2 + guaiac 03-21-19: + guaiac  03-22-19: wbc 5.1; hgb 11.0; hct 36.0; mcv 99.7 plt 265 03-25-19: guaiac neg X 3  04-22-19: tsh 0.317 free T3: 2.9 free T4; 0.98  NO NEW LABS.    Review of Systems  Unable to perform ROS: Dementia (unable to participate )    Physical Exam Constitutional:      General: She is not in acute distress.    Appearance: She is well-developed. She is not diaphoretic.  Neck:     Thyroid: Thyroid mass and thyromegaly present.  Cardiovascular:     Rate and Rhythm: Normal rate and regular rhythm.     Heart sounds: Normal heart sounds.  Pulmonary:     Effort: Pulmonary effort is normal. No respiratory distress.     Breath sounds: Normal breath sounds.  Abdominal:     General: Bowel sounds are normal. There is no distension.     Palpations: Abdomen is soft.     Tenderness: There is no abdominal tenderness.  Musculoskeletal:        General: Normal range of motion.     Cervical back: Neck supple.     Right lower leg: No edema.     Left lower leg: No edema.     Comments: Kyphosis   Lymphadenopathy:     Cervical: No cervical adenopathy.  Skin:    General: Skin is warm and dry.    Neurological:     Mental Status: She is alert. Mental status is at baseline.  Psychiatric:        Mood and Affect: Mood normal.     ASSESSMENT/ PLAN:  TODAY  1. Failure to thrive in adult 2. Dementia without behavioral disturbance unspecified dementia type  Will restart remeron 7.5 mg nightly to help maintain her appetite and weight.     MD is aware of resident's narcotic use and is in agreement with current plan of care. We will attempt to wean resident as appropriate.  [name] NP [facility]  Contact [phone] Monday through Friday 8am- 5pm  After hours call [phone]

## 2019-06-25 ENCOUNTER — Non-Acute Institutional Stay (SKILLED_NURSING_FACILITY): Payer: Medicare Other | Admitting: Adult Health

## 2019-06-25 ENCOUNTER — Encounter: Payer: Self-pay | Admitting: Adult Health

## 2019-06-25 DIAGNOSIS — Z0000 Encounter for general adult medical examination without abnormal findings: Secondary | ICD-10-CM | POA: Diagnosis not present

## 2019-06-25 NOTE — Progress Notes (Signed)
Subjective:   [name] is a 86 y.o. female who presents for Medicare Annual (Subsequent) preventive examination.long term resident of [facility]   Review of Systems:  Review of Systems  Unable to perform ROS: Dementia (unable to participate )    Cardiac Risk Factors include: advanced age (>68mn, >>11women);hypertension;sedentary lifestyle     Objective:     Vitals: BP 133/73   Pulse 66   Temp 98.6 F (37 C) (Oral)   Resp 16   Ht 5' (1.524 m)   Wt 116 lb 9.6 oz (52.9 kg)   SpO2 92%   BMI 22.77 kg/m   Body mass index is 22.77 kg/m.  Advanced Directives 06/25/2019 06/23/2019 06/03/2019 05/26/2019 04/20/2019 03/24/2019 03/23/2019  Does Patient Have a Medical Advance Directive? Yes Yes Yes Yes Yes Yes Yes  Type of Advance Directive Out of facility DNR (pink MOST or yellow form) [place] Point[place] of facility DNR (pink MOST or yellow form);Living will [place]Living will;Out of facility DNR (pink MOST or yellow form) [place] (pink MOST or yellow form);Living will HSierra ViewOut of facility DNR (pink MOST or yellow form);Living will HPoplarvilleLiving will [place]Living will;Out of facility DNR (pink MOST or yellow form)  Does patient want to make changes to medical advance directive? No - Patient declined No - Patient declined No - Patient declined No - Patient declined No - Patient declined No - Patient declined No - Patient declined  Copy of [place]in Chart? - Yes - validated most recent copy scanned in chart (See row information) Yes - validated most recent copy scanned in chart (See row information) Yes - validated most recent copy scanned in chart (See row information) Yes - validated most recent copy scanned in chart (See row information) Yes - validated most recent copy scanned in chart (See row information) Yes - validated most recent  copy scanned in chart (See row information)  Would patient like information on creating a medical advance directive? No - Patient declined - - - - - -  Pre-existing out of facility DNR order (yellow form or pink MOST form) - - - - - - -    Tobacco Social History   Tobacco Use  Smoking Status Never Smoker  Smokeless Tobacco Never Used     Counseling given: Not Answered   Clinical Intake:  Pre-visit preparation completed: Yes  Pain : No/denies pain     BMI - recorded: 22.77 Nutritional Status: BMI of 19-24  Normal Diabetes: No     Interpreter Needed?: No     Past Medical History:  Diagnosis Date  . Anxiety   . Dementia ([place]   . GERD (gastroesophageal reflux disease)   . Goiter   . Hypertension   . Osteoarthritis   . Osteoporosis   . Vitamin D insufficiency    Past Surgical History:  Procedure Laterality Date  . APPENDECTOMY    . KIDNEY STONE SURGERY    . ORIF HIP FRACTURE Right 11/20/2012   Procedure: OPEN REDUCTION INTERNAL FIXATION RIGHT HIP;  Surgeon: WSanjuana Kava MD;  Location: [facility];  Service: Orthopedics;  Laterality: Right;  . stress fractures of legs    . TUBAL LIGATION     Family History  Problem Relation Age of Onset  . Heart attack Father   . Heart disease Neg Hx    Social History   Socioeconomic History  .  Marital status: Widowed    Spouse name: Not on file  . Number of children: Not on file  . Years of education: Not on file  . Highest education level: Not on file  Occupational History  . Not on file  Tobacco Use  . Smoking status: Never Smoker  . Smokeless tobacco: Never Used  Substance and Sexual Activity  . Alcohol use: No  . Drug use: No  . Sexual activity: Never  Other Topics Concern  . Not on file  Social History Narrative  . Not on file   Social Determinants of Health   Financial Resource Strain:   . Difficulty of Paying Living Expenses:   Food Insecurity:   . Worried About Charity fundraiser in the Last Year:    . Arboriculturist in the Last Year:   Transportation Needs:   . Film/video editor (Medical):   Marland Kitchen Lack of Transportation (Non-Medical):   Physical Activity:   . Days of Exercise per Week:   . Minutes of Exercise per Session:   Stress:   . Feeling of Stress :   Social Connections:   . Frequency of Communication with Friends and Family:   . Frequency of Social Gatherings with Friends and Family:   . Attends Religious Services:   . Active Member of Clubs or Organizations:   . Attends Archivist Meetings:   Marland Kitchen Marital Status:     Outpatient Encounter Medications as of 06/25/2019  Medication Sig  . acetaminophen (TYLENOL) 500 MG tablet Take 1,000 mg by mouth 3 (three) times daily. For pain  . dextromethorphan-guaiFENesin (TUSSIN DM) 10-100 MG/5ML liquid Take 10 mLs by mouth every 6 (six) hours as needed for cough.  . escitalopram (LEXAPRO) 20 MG tablet Take 20 mg by mouth daily.  Marland Kitchen esomeprazole (NEXIUM) 40 MG capsule Take 40 mg by mouth 2 (two) times daily before a meal.   . feeding supplement, ENSURE ENLIVE, (ENSURE ENLIVE) LIQD Take 237 mLs by mouth 3 (three) times daily between meals.  Marland Kitchen loperamide (IMODIUM) 2 MG capsule Take 2 mg by mouth every 4 (four) hours as needed for diarrhea or loose stools.  Marland Kitchen losartan (COZAAR) 100 MG tablet Take 100 mg by mouth daily.  . magnesium hydroxide (MILK OF MAGNESIA) 400 MG/5ML suspension Take 30 mLs by mouth daily as needed (For Constipation).  . mirtazapine (REMERON) 7.5 MG tablet Take 7.5 mg by mouth at bedtime.  . NON FORMULARY Mechanical soft diet with ground meat  . OXYGEN Inhale 3 L into the lungs continuous.   No facility-administered encounter medications on file as of 06/25/2019.    Activities of Daily Living In your present state of health, do you have any difficulty performing the following activities: 06/25/2019 02/11/2019  Hearing? N N  Vision? N N  Difficulty concentrating or making decisions? Tempie Donning  Walking or climbing  stairs? Y N  Dressing or bathing? Y Y  Doing errands, shopping? Y N  Preparing Food and eating ? Y -  Using the Toilet? Y -  In the past six months, have you accidently leaked urine? Y -  Do you have problems with loss of bowel control? Y -  Managing your Medications? Y -  Managing your Finances? Y -  Housekeeping or managing your Housekeeping? Y -  Some recent data might be hidden    Patient Care Team: Gerlene Fee, NP as PCP - General (Geriatric Medicine) [name], MD as PCP -  Cardiology [facility] [place] [facility]    Assessment:   This is a routine wellness examination for [name].  Exercise Activities and Dietary recommendations Current Exercise Habits: The patient does not participate in regular exercise at present  Goals    . DIET - INCREASE WATER INTAKE    . Follow up with Primary Care Provider    . General - Client will not be readmitted within 30 days (C-SNP)       Fall Risk Fall Risk  06/25/2019  Falls in the past year? 0  Risk for fall due to : Impaired balance/gait;Impaired mobility   Is the patient's home free of loose throw rugs in walkways, pet beds, electrical cords, etc?   yes      Grab bars in the bathroom? yes      Handrails on the stairs?   n/a      Adequate lighting?   yes  Timed Get Up and Go performed: unable to perform   Depression Screen PHQ 2/9 Scores 06/25/2019  Exception Documentation Medical reason     Cognitive Function        Immunization History  Administered Date(s) Administered  . Influenza-Unspecified 11/26/2018  . MMR 04/06/2015  . Moderna SARS-COVID-2 Vaccination 05/06/2019, 06/02/2019  . Pneumococcal Conjugate-13 04/06/2015  . Pneumococcal-Unspecified 11/16/2001  . Tdap 03/08/2011    Qualifies for Shingles Vaccine?long term resident of [facility]   Screening Tests Health Maintenance  Topic Date Due  . INFLUENZA VACCINE  09/05/2019  . TETANUS/TDAP  03/07/2021  . DEXA SCAN   Completed  . COVID-19 Vaccine  Completed  . PNA vac Low Risk Adult  Completed    Cancer Screenings: Lung: Low Dose CT Chest recommended if Age 65-80 years, 30 pack-year currently smoking OR have quit w/in 15years. Patient does not  qualify. Breast:  Up to date on Mammogram? n/a   Up to date of Bone Density/Dexa? n/a Colorectal: n/a  Additional Screenings: n/a: Hepatitis C Screening:      Plan:     I have personally reviewed and noted the following in the patient's chart:   . Medical and social history . Use of alcohol, tobacco or illicit drugs  . Current medications and supplements . Functional ability and status . Nutritional status . Physical activity . Advanced directives . List of other physicians . Hospitalizations, surgeries, and ER visits in previous 12 months . Vitals . Screenings to include cognitive, depression, and falls . Referrals and appointments  In addition, I have reviewed and discussed with patient certain preventive protocols, quality metrics, and best practice recommendations. A written personalized care plan for preventive services as well as general preventive health recommendations were provided to patient.     Gerlene Fee, NP  06/25/2019

## 2019-06-25 NOTE — Patient Instructions (Signed)
  [name] , Thank you for taking time to come for your Medicare Wellness Visit. I appreciate your ongoing commitment to your health goals. Please review the following plan we discussed and let me know if I can assist you in the future.   These are the goals we discussed: Goals    . DIET - INCREASE WATER INTAKE    . Follow up with Primary Care Provider    . General - Client will not be readmitted within 30 days (C-SNP)       This is a list of the screening recommended for you and due dates:  Health Maintenance  Topic Date Due  . Flu Shot  09/05/2019  . Tetanus Vaccine  03/07/2021  . DEXA scan (bone density measurement)  Completed  . COVID-19 Vaccine  Completed  . Pneumonia vaccines  Completed

## 2019-06-29 ENCOUNTER — Non-Acute Institutional Stay (SKILLED_NURSING_FACILITY): Payer: Medicare Other | Admitting: Adult Health

## 2019-06-29 ENCOUNTER — Encounter: Payer: Self-pay | Admitting: Adult Health

## 2019-06-29 DIAGNOSIS — E049 Nontoxic goiter, unspecified: Secondary | ICD-10-CM | POA: Diagnosis not present

## 2019-06-29 DIAGNOSIS — F339 Major depressive disorder, recurrent, unspecified: Secondary | ICD-10-CM

## 2019-06-29 DIAGNOSIS — E440 Moderate protein-calorie malnutrition: Secondary | ICD-10-CM

## 2019-06-29 NOTE — Progress Notes (Signed)
Location:    [facility]  Nursing Home Room Number: 122/W Place of Service:  SNF (31)   CODE STATUS: DNR  Allergies  Allergen Reactions  . Hydrocodone-Acetaminophen Nausea And Vomiting    Chief Complaint  Patient presents with  . Medical Management of Chronic Issues            Protein calorie malnutrition   Major depression chronic  Goiter:     HPI:  She is a 86 year old long term resident of this facility being seen for the management of her chronic illnesses; malnutrition; goiter; depression. There are no reports of uncontrolled pain; no agitation no anxiety; no changes in appetite.   Past Medical History:  Diagnosis Date  . Anxiety   . Dementia (HCC)   . GERD (gastroesophageal reflux disease)   . Goiter   . Hypertension   . Osteoarthritis   . Osteoporosis   . Vitamin D insufficiency     Past Surgical History:  Procedure Laterality Date  . APPENDECTOMY    . KIDNEY STONE SURGERY    . ORIF HIP FRACTURE Right 11/20/2012   Procedure: OPEN REDUCTION INTERNAL FIXATION RIGHT HIP;  Surgeon: Darreld Mclean, MD;  Location: [facility];  Service: Orthopedics;  Laterality: Right;  . stress fractures of legs    . TUBAL LIGATION      Social History   Socioeconomic History  . Marital status: Widowed    Spouse name: Not on file  . Number of children: Not on file  . Years of education: Not on file  . Highest education level: Not on file  Occupational History  . Not on file  Tobacco Use  . Smoking status: Never Smoker  . Smokeless tobacco: Never Used  Substance and Sexual Activity  . Alcohol use: No  . Drug use: No  . Sexual activity: Never  Other Topics Concern  . Not on file  Social History Narrative  . Not on file   Social Determinants of Health   Financial Resource Strain:   . Difficulty of Paying Living Expenses:   Food Insecurity:   . Worried About Programme researcher, broadcasting/film/video in the Last Year:   . Barista in the Last Year:   Transportation Needs:     . Freight forwarder (Medical):   Marland Kitchen Lack of Transportation (Non-Medical):   Physical Activity:   . Days of Exercise per Week:   . Minutes of Exercise per Session:   Stress:   . Feeling of Stress :   Social Connections:   . Frequency of Communication with Friends and Family:   . Frequency of Social Gatherings with Friends and Family:   . Attends Religious Services:   . Active Member of Clubs or Organizations:   . Attends Banker Meetings:   Marland Kitchen Marital Status:   Intimate Partner Violence:   . Fear of Current or Ex-Partner:   . Emotionally Abused:   Marland Kitchen Physically Abused:   . Sexually Abused:    Family History  Problem Relation Age of Onset  . Heart attack Father   . Heart disease Neg Hx       VITAL SIGNS BP (!) 141/62   Pulse 64   Temp 97.7 F (36.5 C) (Oral)   Resp 16   Ht 5' (1.524 m)   Wt 116 lb 9.6 oz (52.9 kg)   SpO2 91%   BMI 22.77 kg/m   Outpatient Encounter Medications as of 06/29/2019  Medication Sig  .  acetaminophen (TYLENOL) 500 MG tablet Take 1,000 mg by mouth 3 (three) times daily. For pain  . dextromethorphan-guaiFENesin (TUSSIN DM) 10-100 MG/5ML liquid Take 10 mLs by mouth every 6 (six) hours as needed for cough.  . escitalopram (LEXAPRO) 20 MG tablet Take 20 mg by mouth daily.  Marland Kitchen esomeprazole (NEXIUM) 40 MG capsule Take 40 mg by mouth 2 (two) times daily before a meal.   . feeding supplement, ENSURE ENLIVE, (ENSURE ENLIVE) LIQD Take 237 mLs by mouth 3 (three) times daily between meals.  Marland Kitchen loperamide (IMODIUM) 2 MG capsule Take 2 mg by mouth every 4 (four) hours as needed for diarrhea or loose stools.  Marland Kitchen losartan (COZAAR) 100 MG tablet Take 100 mg by mouth daily.  . magnesium hydroxide (MILK OF MAGNESIA) 400 MG/5ML suspension Take 30 mLs by mouth daily as needed (For Constipation).  . mirtazapine (REMERON) 7.5 MG tablet Take 7.5 mg by mouth at bedtime.  . NON FORMULARY Mechanical soft diet with ground meat  . OXYGEN Inhale 3 L into the  lungs continuous.   No facility-administered encounter medications on file as of 06/29/2019.     SIGNIFICANT DIAGNOSTIC EXAMS   PREVIOUS  02-11-19: chest x-ray:  1. Bilateral perihilar and bibasilar streaky densities concerning for developing infiltrate. Follow-up recommended. 2. Probable small bilateral pleural effusions.  02-17-19: ct of chest/angio of chest:  Patchy bilateral ground-glass opacities, most pronounced in the right upper lobe most compatible with pneumonia.     Small bilateral pleural effusions with bilateral lower lobe atelectasis or pneumonia. Cardiomegaly, coronary artery disease. Moderate-sized hiatal hernia. Aortic Atherosclerosis   02-18-19: 2-d echo:   1. Left ventricular ejection fraction, by visual estimation, is 70 to 75%. The left ventricle has hyperdynamic function. There is no left ventricular hypertrophy.  2. Left ventricular diastolic parameters are indeterminate.  3. The left ventricle has no regional wall motion abnormalities.  4. Global right ventricle was not well visualized.The right ventricular size is not well visualized. Right vetricular wall thickness was not assessed.  5. Left atrial size was not well visualized.  6. Right atrial size was not well visualized.  7. The mitral valve was not well visualized. No evidence of mitral valve regurgitation. No evidence of mitral stenosis.  8. The tricuspid valve is not well visualized.  9. The aortic valve was not well visualized. Aortic valve regurgitation is not visualized. Technically difficult assement, grossly there is no significant aortic stenosis. 10. The pulmonic valve was not well visualized. Pulmonic valve regurgitation is not visualized. 11. The interatrial septum was not well visualized. 12. Technically difficult and limited study  NO NEW EXAMS.    LABS REVIEWED PREVIOUS   02-11-19: wbc 4.2; hgb 12.9; hct 39.8 mcv 96.4 plt 211; glucose 112; bun 25; creat 0.83; k+ 3.1; na++ 134; ca 8.6; liver  normal albumin 3.3 blood culture: no growth 02-14-19: glucose 129; bun 16; creat 0.56; k+ 3.8; na++ 140; ca 8.6 hgb a1c 6.3 02-17-19: wbc 7.6; hgb 11.2 hct 35.9; mcv 97.6 plt 256; glucose 114; bun 15; creat 0.48; k+ 4.1; na++ 138; ca 8.7; mag 2.1; tsh 0.369 free t4: 1.18; vit B12: 251 folate 7.3 d-dimer 1.51 02-19-19: wbc 8.6; hgb 11.0; hct 34.0; mcv 94.2 plt 313; glucose 115; bun 16; creat 0.63; k+ 3.6; na++ 134; ca 8.4 mag 1.9  02-26-19: wbc 11.8; hgb 11.9; hct 37.2; mcv 97.6 plt 337; glucose 127; bun 14; creat 0.66; k+ 4.1; na++ 137; ca 8.9;  03-02-19: wbc 4.0; hgb 11.5; hct 36.8;  mcv 97.6 plt 286 03-17-19: wbc 9.1; hgb 12.2; hct 39.5; mcv 98.8 plt 343; glucose 156; bun 36; creat 1.06; k+ 3.9; na++ 137; ca 9.4 liver normal albumin 3.2 + guaiac 03-21-19: + guaiac  03-22-19: wbc 5.1; hgb 11.0; hct 36.0; mcv 99.7 plt 265 03-25-19: guaiac neg X 3  04-22-19: tsh 0.317 free T3: 2.9 free T4; 0.98  TODAY  06-03-19: wbc 6.3; hgb 10.8; hct 34.2; mcv 93.7 plt 185; glucose 117; bun 21; creat 0.67; k+ 3.7; na++ 140; ca 9.0    Review of Systems  Unable to perform ROS: Dementia (unable to participate )   Physical Exam Constitutional:      General: She is not in acute distress.    Appearance: She is well-developed. She is not diaphoretic.     Comments: thin  Neck:     Thyroid: Thyromegaly present.  Cardiovascular:     Rate and Rhythm: Normal rate and regular rhythm.     Pulses: Normal pulses.     Heart sounds: Normal heart sounds.  Pulmonary:     Effort: Pulmonary effort is normal. No respiratory distress.     Breath sounds: Normal breath sounds.  Abdominal:     General: Bowel sounds are normal. There is no distension.     Palpations: Abdomen is soft.     Tenderness: There is no abdominal tenderness.  Musculoskeletal:        General: Normal range of motion.     Cervical back: Neck supple.     Right lower leg: No edema.     Left lower leg: No edema.     Comments: Kyphosis   Lymphadenopathy:      Cervical: No cervical adenopathy.  Skin:    General: Skin is warm and dry.  Neurological:     Mental Status: She is alert. Mental status is at baseline.  Psychiatric:        Mood and Affect: Mood normal.       ASSESSMENT/ PLAN:  TODAY  1. Protein calorie malnutrition is stable albumin 3.3 weight is 113 pounds will continue supplements as directed  2. Major depression chronic is stable will continue lexapro 20 mg daily   3. Goiter: is stable tsh 0.369 will monitor    PREVIOUS   4. GERD without esophagitis: without change in status: hgb is stable from lower GI bleed will continue nexium 40 mg twice daily   5. Dementia without behavioral disturbance unspecified dementia type weight is stable at 113 pounds; the aricept was stopped due to weight loss from 121 pounds; weight loss is an unfortunate outcome in the late stages of disease process.   6. Primary osteoarthritis multiple joints is stable will continue tylenol 1 gm three times daily  7. Essential hypertension: is stable b/p 141/62 will continue cozaar 100 mg daily   8. Protein calorie malnutrition: is without change albumin 3.3 will continue supplements as directed weight is 116 pounds .  9. Major depression chronic is stable will continue lexapro 20 mg daily     MD is aware of resident's narcotic use and is in agreement with current plan of care. We will attempt to wean resident as appropriate.  Ok Edwards NP [facility]  Contact [phone] Monday through Friday 8am- 5pm  After hours call [phone]

## 2019-07-21 ENCOUNTER — Encounter: Payer: Self-pay | Admitting: Adult Health

## 2019-07-21 ENCOUNTER — Non-Acute Institutional Stay: Payer: Self-pay | Admitting: Adult Health

## 2019-07-21 DIAGNOSIS — R627 Adult failure to thrive: Secondary | ICD-10-CM | POA: Diagnosis not present

## 2019-07-21 DIAGNOSIS — F339 Major depressive disorder, recurrent, unspecified: Secondary | ICD-10-CM

## 2019-07-21 NOTE — Progress Notes (Signed)
Location:    [facility] Room Number: 122/W Place of Service:  SNF (31)   CODE STATUS: DNR  Allergies  Allergen Reactions   Hydrocodone-Acetaminophen Nausea And Vomiting    Chief Complaint  Patient presents with   Acute Visit    Medication Review    HPI:  We have come together for her medication review. She is presently lexapro 20 mg daily. She had been taking remeron 7.5 mg for appetite; however; she failed coming off this medication and was restarted on 06-23-19. Her weight is stable; has gained 2 pounds. There are no reports of anxiety or depressive thoughts. No reports of agitation or insomnia. She is due for a dose reduction.   Past Medical History:  Diagnosis Date   Anxiety    Dementia ([place])    GERD (gastroesophageal reflux disease)    Goiter    Hypertension    Osteoarthritis    Osteoporosis    Vitamin D insufficiency     Past Surgical History:  Procedure Laterality Date   APPENDECTOMY     KIDNEY STONE SURGERY     ORIF HIP FRACTURE Right 11/20/2012   Procedure: OPEN REDUCTION INTERNAL FIXATION RIGHT HIP;  Surgeon: Sanjuana Kava, MD;  Location: [facility];  Service: Orthopedics;  Laterality: Right;   stress fractures of legs     TUBAL LIGATION      Social History   Socioeconomic History   Marital status: Widowed    Spouse name: Not on file   Number of children: Not on file   Years of education: Not on file   Highest education level: Not on file  Occupational History   Not on file  Tobacco Use   Smoking status: Never Smoker   Smokeless tobacco: Never Used  Vaping Use   Vaping Use: Never used  Substance and Sexual Activity   Alcohol use: No   Drug use: No   Sexual activity: Never  Other Topics Concern   Not on file  Social History Narrative   Not on file   Social Determinants of Health   Financial Resource Strain:    Difficulty of Paying Living Expenses:   Food Insecurity:    Worried About  Charity fundraiser in the Last Year:    Arboriculturist in the Last Year:   Transportation Needs:    Film/video editor (Medical):    Lack of Transportation (Non-Medical):   Physical Activity:    Days of Exercise per Week:    Minutes of Exercise per Session:   Stress:    Feeling of Stress :   Social Connections:    Frequency of Communication with Friends and Family:    Frequency of Social Gatherings with Friends and Family:    Attends Religious Services:    Active Member of Clubs or Organizations:    Attends Music therapist:    Marital Status:   Intimate Partner Violence:    Fear of Current or Ex-Partner:    Emotionally Abused:    Physically Abused:    Sexually Abused:    Family History  Problem Relation Age of Onset   Heart attack Father    Heart disease Neg Hx       VITAL SIGNS BP (!) 158/69    Pulse 64    Temp 97.9 F (36.6 C) (Oral)    Ht 5' (1.524 m)    Wt 115 lb 9.6 oz (52.4 kg)    SpO2 90%  BMI 22.58 kg/m   Outpatient Encounter Medications as of 07/21/2019  Medication Sig   acetaminophen (TYLENOL) 500 MG tablet Take 1,000 mg by mouth 3 (three) times daily. For pain   dextromethorphan-guaiFENesin (TUSSIN DM) 10-100 MG/5ML liquid Take 10 mLs by mouth every 6 (six) hours as needed for cough.   escitalopram (LEXAPRO) 20 MG tablet Take 20 mg by mouth daily.   esomeprazole (NEXIUM) 40 MG capsule Take 40 mg by mouth 2 (two) times daily before a meal.    feeding supplement, ENSURE ENLIVE, (ENSURE ENLIVE) LIQD Take 237 mLs by mouth 3 (three) times daily between meals.   loperamide (IMODIUM) 2 MG capsule Take 2 mg by mouth every 4 (four) hours as needed for diarrhea or loose stools.   losartan (COZAAR) 100 MG tablet Take 100 mg by mouth daily.   magnesium hydroxide (MILK OF MAGNESIA) 400 MG/5ML suspension Take 30 mLs by mouth daily as needed (For Constipation).   mirtazapine (REMERON) 7.5 MG tablet Take 7.5 mg by mouth at  bedtime.   NON FORMULARY Mechanical soft diet with ground meat   OXYGEN Inhale 3 L into the lungs continuous.   No facility-administered encounter medications on file as of 07/21/2019.     SIGNIFICANT DIAGNOSTIC EXAMS   PREVIOUS  02-11-19: chest x-ray:  1. Bilateral perihilar and bibasilar streaky densities concerning for developing infiltrate. Follow-up recommended. 2. Probable small bilateral pleural effusions.  02-17-19: ct of chest/angio of chest:  Patchy bilateral ground-glass opacities, most pronounced in the right upper lobe most compatible with pneumonia.     Small bilateral pleural effusions with bilateral lower lobe atelectasis or pneumonia. Cardiomegaly, coronary artery disease. Moderate-sized hiatal hernia. Aortic Atherosclerosis   02-18-19: 2-d echo:   1. Left ventricular ejection fraction, by visual estimation, is 70 to 75%. The left ventricle has hyperdynamic function. There is no left ventricular hypertrophy.  2. Left ventricular diastolic parameters are indeterminate.  3. The left ventricle has no regional wall motion abnormalities.  4. Global right ventricle was not well visualized.The right ventricular size is not well visualized. Right vetricular wall thickness was not assessed.  5. Left atrial size was not well visualized.  6. Right atrial size was not well visualized.  7. The mitral valve was not well visualized. No evidence of mitral valve regurgitation. No evidence of mitral stenosis.  8. The tricuspid valve is not well visualized.  9. The aortic valve was not well visualized. Aortic valve regurgitation is not visualized. Technically difficult assement, grossly there is no significant aortic stenosis. 10. The pulmonic valve was not well visualized. Pulmonic valve regurgitation is not visualized. 11. The interatrial septum was not well visualized. 12. Technically difficult and limited study  NO NEW EXAMS.    LABS REVIEWED PREVIOUS   02-11-19: wbc 4.2; hgb  12.9; hct 39.8 mcv 96.4 plt 211; glucose 112; bun 25; creat 0.83; k+ 3.1; na++ 134; ca 8.6; liver normal albumin 3.3 blood culture: no growth 02-14-19: glucose 129; bun 16; creat 0.56; k+ 3.8; na++ 140; ca 8.6 hgb a1c 6.3 02-17-19: wbc 7.6; hgb 11.2 hct 35.9; mcv 97.6 plt 256; glucose 114; bun 15; creat 0.48; k+ 4.1; na++ 138; ca 8.7; mag 2.1; tsh 0.369 free t4: 1.18; vit B12: 251 folate 7.3 d-dimer 1.51 02-19-19: wbc 8.6; hgb 11.0; hct 34.0; mcv 94.2 plt 313; glucose 115; bun 16; creat 0.63; k+ 3.6; na++ 134; ca 8.4 mag 1.9  02-26-19: wbc 11.8; hgb 11.9; hct 37.2; mcv 97.6 plt 337; glucose 127; bun 14;  creat 0.66; k+ 4.1; na++ 137; ca 8.9;  03-02-19: wbc 4.0; hgb 11.5; hct 36.8; mcv 97.6 plt 286 03-17-19: wbc 9.1; hgb 12.2; hct 39.5; mcv 98.8 plt 343; glucose 156; bun 36; creat 1.06; k+ 3.9; na++ 137; ca 9.4 liver normal albumin 3.2 + guaiac 03-21-19: + guaiac  03-22-19: wbc 5.1; hgb 11.0; hct 36.0; mcv 99.7 plt 265 03-25-19: guaiac neg X 3  04-22-19: tsh 0.317 free T3: 2.9 free T4; 0.98 06-03-19: wbc 6.3; hgb 10.8; hct 34.2; mcv 93.7 plt 185; glucose 117; bun 21; creat 0.67; k+ 3.7; na++ 140; ca 9.0   NO NEW LABS.   Review of Systems  Unable to perform ROS: Dementia (unable to participate )   Physical Exam Constitutional:      General: She is not in acute distress.    Appearance: She is well-developed. She is not diaphoretic.     Comments: thin  Neck:     Thyroid: Thyromegaly present.  Cardiovascular:     Rate and Rhythm: Normal rate and regular rhythm.     Heart sounds: Normal heart sounds.  Pulmonary:     Effort: Pulmonary effort is normal. No respiratory distress.     Breath sounds: Normal breath sounds.  Abdominal:     General: Bowel sounds are normal. There is no distension.     Palpations: Abdomen is soft.     Tenderness: There is no abdominal tenderness.  Musculoskeletal:        General: Normal range of motion.     Right lower leg: No edema.     Left lower leg: No edema.      Comments: Kyphosis   Lymphadenopathy:     Cervical: No cervical adenopathy.  Skin:    General: Skin is warm and dry.  Neurological:     Mental Status: She is alert. Mental status is at baseline.  Psychiatric:        Mood and Affect: Mood normal.       ASSESSMENT/ PLAN:  TODAY  1. Major depression recurrent chronic 2. Failure to thrive in adult  Will continue remeron 7.5 mg nightly  Will lower her lexapro to 15 mg daily  Will continue to monitor her status.     MD is aware of resident's narcotic use and is in agreement with current plan of care. We will attempt to wean resident as appropriate.  [name] NP [facility]  Contact [phone] Monday through Friday 8am- 5pm  After hours call [phone]

## 2019-07-26 DIAGNOSIS — L603 Nail dystrophy: Secondary | ICD-10-CM | POA: Diagnosis not present

## 2019-07-26 DIAGNOSIS — I739 Peripheral vascular disease, unspecified: Secondary | ICD-10-CM | POA: Diagnosis not present

## 2019-07-26 DIAGNOSIS — M2041 Other hammer toe(s) (acquired), right foot: Secondary | ICD-10-CM | POA: Diagnosis not present

## 2019-07-26 DIAGNOSIS — M2042 Other hammer toe(s) (acquired), left foot: Secondary | ICD-10-CM | POA: Diagnosis not present

## 2019-07-26 DIAGNOSIS — R262 Difficulty in walking, not elsewhere classified: Secondary | ICD-10-CM | POA: Diagnosis not present

## 2019-07-26 DIAGNOSIS — B351 Tinea unguium: Secondary | ICD-10-CM | POA: Diagnosis not present

## 2019-07-28 ENCOUNTER — Non-Acute Institutional Stay (SKILLED_NURSING_FACILITY): Payer: Medicare Other | Admitting: Adult Health

## 2019-07-28 ENCOUNTER — Encounter: Payer: Self-pay | Admitting: Adult Health

## 2019-07-28 DIAGNOSIS — K219 Gastro-esophageal reflux disease without esophagitis: Secondary | ICD-10-CM

## 2019-07-28 DIAGNOSIS — F0390 Unspecified dementia without behavioral disturbance: Secondary | ICD-10-CM | POA: Diagnosis not present

## 2019-07-28 DIAGNOSIS — M159 Polyosteoarthritis, unspecified: Secondary | ICD-10-CM

## 2019-07-28 DIAGNOSIS — M8949 Other hypertrophic osteoarthropathy, multiple sites: Secondary | ICD-10-CM | POA: Diagnosis not present

## 2019-07-28 NOTE — Progress Notes (Signed)
Location:    [facility] Room Number: 122/W Place of Service:  SNF (31)   CODE STATUS: DNR  Allergies  Allergen Reactions  . Hydrocodone-Acetaminophen Nausea And Vomiting    Chief Complaint  Patient presents with  . Medical Management of Chronic Issues          GERD without esophagitis:    Dementia without behavioral disturbance unspecified dementia type:     Primary osteoarthritis multiple joints     HPI:  She is a 86 year old long term resident of this facility being seen for the management of her chronic illnesses: gerd; dementia; osteoarthritis. There are no reports of heart burn; no reports of changes in appetite; no reports of agitation no reports of uncontrolled pain.   Past Medical History:  Diagnosis Date  . Anxiety   . Dementia ([place])   . GERD (gastroesophageal reflux disease)   . Goiter   . Hypertension   . Osteoarthritis   . Osteoporosis   . Vitamin D insufficiency     Past Surgical History:  Procedure Laterality Date  . APPENDECTOMY    . KIDNEY STONE SURGERY    . ORIF HIP FRACTURE Right 11/20/2012   Procedure: OPEN REDUCTION INTERNAL FIXATION RIGHT HIP;  Surgeon: Sanjuana Kava, MD;  Location: [facility];  Service: Orthopedics;  Laterality: Right;  . stress fractures of legs    . TUBAL LIGATION      Social History   Socioeconomic History  . Marital status: Widowed    Spouse name: Not on file  . Number of children: Not on file  . Years of education: Not on file  . Highest education level: Not on file  Occupational History  . Not on file  Tobacco Use  . Smoking status: Never Smoker  . Smokeless tobacco: Never Used  Vaping Use  . Vaping Use: Never used  Substance and Sexual Activity  . Alcohol use: No  . Drug use: No  . Sexual activity: Never  Other Topics Concern  . Not on file  Social History Narrative  . Not on file   Social Determinants of Health   Financial Resource Strain:   . Difficulty of Paying Living Expenses:    Food Insecurity:   . Worried About Charity fundraiser in the Last Year:   . Arboriculturist in the Last Year:   Transportation Needs:   . Film/video editor (Medical):   Marland Kitchen Lack of Transportation (Non-Medical):   Physical Activity:   . Days of Exercise per Week:   . Minutes of Exercise per Session:   Stress:   . Feeling of Stress :   Social Connections:   . Frequency of Communication with Friends and Family:   . Frequency of Social Gatherings with Friends and Family:   . Attends Religious Services:   . Active Member of Clubs or Organizations:   . Attends Archivist Meetings:   Marland Kitchen Marital Status:   Intimate Partner Violence:   . Fear of Current or Ex-Partner:   . Emotionally Abused:   Marland Kitchen Physically Abused:   . Sexually Abused:    Family History  Problem Relation Age of Onset  . Heart attack Father   . Heart disease Neg Hx       VITAL SIGNS BP 130/66   Pulse 62   Temp 98.4 F (36.9 C) (Oral)   Resp 20   Ht 5' (1.524 m)   Wt 115 lb 9.6 oz (  52.4 kg)   SpO2 93%   BMI 22.58 kg/m   Outpatient Encounter Medications as of 07/28/2019  Medication Sig  . acetaminophen (TYLENOL) 500 MG tablet Take 1,000 mg by mouth 3 (three) times daily. For pain  . dextromethorphan-guaiFENesin (TUSSIN DM) 10-100 MG/5ML liquid Take 10 mLs by mouth every 6 (six) hours as needed for cough.  . escitalopram (LEXAPRO) 20 MG tablet Take 15 mg by mouth daily.   Marland Kitchen esomeprazole (NEXIUM) 40 MG capsule Take 40 mg by mouth 2 (two) times daily before a meal.   . feeding supplement, ENSURE ENLIVE, (ENSURE ENLIVE) LIQD Take 237 mLs by mouth 3 (three) times daily between meals.  Marland Kitchen loperamide (IMODIUM) 2 MG capsule Take 2 mg by mouth every 4 (four) hours as needed for diarrhea or loose stools.  Marland Kitchen losartan (COZAAR) 100 MG tablet Take 100 mg by mouth daily.  . magnesium hydroxide (MILK OF MAGNESIA) 400 MG/5ML suspension Take 30 mLs by mouth daily as needed (For Constipation).  . mirtazapine  (REMERON) 7.5 MG tablet Take 7.5 mg by mouth at bedtime.  . NON FORMULARY Mechanical soft diet with ground meat  . OXYGEN Inhale 3 L into the lungs continuous.   No facility-administered encounter medications on file as of 07/28/2019.     SIGNIFICANT DIAGNOSTIC EXAMS   PREVIOUS  02-11-19: chest x-ray:  1. Bilateral perihilar and bibasilar streaky densities concerning for developing infiltrate. Follow-up recommended. 2. Probable small bilateral pleural effusions.  02-17-19: ct of chest/angio of chest:  Patchy bilateral ground-glass opacities, most pronounced in the right upper lobe most compatible with pneumonia.     Small bilateral pleural effusions with bilateral lower lobe atelectasis or pneumonia. Cardiomegaly, coronary artery disease. Moderate-sized hiatal hernia. Aortic Atherosclerosis   02-18-19: 2-d echo:   1. Left ventricular ejection fraction, by visual estimation, is 70 to 75%. The left ventricle has hyperdynamic function. There is no left ventricular hypertrophy.  2. Left ventricular diastolic parameters are indeterminate.  3. The left ventricle has no regional wall motion abnormalities.  4. Global right ventricle was not well visualized.The right ventricular size is not well visualized. Right vetricular wall thickness was not assessed.  5. Left atrial size was not well visualized.  6. Right atrial size was not well visualized.  7. The mitral valve was not well visualized. No evidence of mitral valve regurgitation. No evidence of mitral stenosis.  8. The tricuspid valve is not well visualized.  9. The aortic valve was not well visualized. Aortic valve regurgitation is not visualized. Technically difficult assement, grossly there is no significant aortic stenosis. 10. The pulmonic valve was not well visualized. Pulmonic valve regurgitation is not visualized. 11. The interatrial septum was not well visualized. 12. Technically difficult and limited study  NO NEW EXAMS.      LABS REVIEWED PREVIOUS   02-11-19: wbc 4.2; hgb 12.9; hct 39.8 mcv 96.4 plt 211; glucose 112; bun 25; creat 0.83; k+ 3.1; na++ 134; ca 8.6; liver normal albumin 3.3 blood culture: no growth 02-14-19: glucose 129; bun 16; creat 0.56; k+ 3.8; na++ 140; ca 8.6 hgb a1c 6.3 02-17-19: wbc 7.6; hgb 11.2 hct 35.9; mcv 97.6 plt 256; glucose 114; bun 15; creat 0.48; k+ 4.1; na++ 138; ca 8.7; mag 2.1; tsh 0.369 free t4: 1.18; vit B12: 251 folate 7.3 d-dimer 1.51 02-19-19: wbc 8.6; hgb 11.0; hct 34.0; mcv 94.2 plt 313; glucose 115; bun 16; creat 0.63; k+ 3.6; na++ 134; ca 8.4 mag 1.9  02-26-19: wbc 11.8; hgb 11.9;  hct 37.2; mcv 97.6 plt 337; glucose 127; bun 14; creat 0.66; k+ 4.1; na++ 137; ca 8.9;  03-02-19: wbc 4.0; hgb 11.5; hct 36.8; mcv 97.6 plt 286 03-17-19: wbc 9.1; hgb 12.2; hct 39.5; mcv 98.8 plt 343; glucose 156; bun 36; creat 1.06; k+ 3.9; na++ 137; ca 9.4 liver normal albumin 3.2 + guaiac 03-21-19: + guaiac  03-22-19: wbc 5.1; hgb 11.0; hct 36.0; mcv 99.7 plt 265 03-25-19: guaiac neg X 3  04-22-19: tsh 0.317 free T3: 2.9 free T4; 0.98 06-03-19: wbc 6.3; hgb 10.8; hct 34.2; mcv 93.7 plt 185; glucose 117; bun 21; creat 0.67; k+ 3.7; na++ 140; ca 9.0   NO NEW LABS.   Review of Systems  Unable to perform ROS: Dementia (unable to participate )   Physical Exam Constitutional:      General: She is not in acute distress.    Appearance: She is well-developed. She is not diaphoretic.  Neck:     Thyroid: Thyromegaly present.  Cardiovascular:     Rate and Rhythm: Normal rate and regular rhythm.     Pulses: Normal pulses.     Heart sounds: Normal heart sounds.  Pulmonary:     Effort: Pulmonary effort is normal. No respiratory distress.     Breath sounds: Normal breath sounds.  Abdominal:     General: Bowel sounds are normal. There is no distension.     Palpations: Abdomen is soft.     Tenderness: There is no abdominal tenderness.  Musculoskeletal:        General: Normal range of motion.     Cervical  back: Neck supple.     Right lower leg: No edema.     Left lower leg: No edema.     Comments: Kyphosis   Lymphadenopathy:     Cervical: No cervical adenopathy.  Skin:    General: Skin is warm and dry.  Neurological:     Mental Status: She is alert. Mental status is at baseline.  Psychiatric:        Mood and Affect: Mood normal.      ASSESSMENT/ PLAN:  TODAY  1. GERD without esophagitis: is stable history of lower GI bleed; will continue nexium 40 mg twice daily   2. Dementia without behavioral disturbance unspecified dementia type: is stable weight is presently stable 115 pounds; had been at 121 pounds; is off aricept. Weight loss is an unfortunate outcome at the last stages of this disease process. Is on remeron 7.5 mg nightly for appetite.   3.  Primary osteoarthritis multiple joints is stable will continue tylenol 1 gm three times daily   PREVIOUS    4. Essential hypertension: is stable b/p 130/66 will continue cozaar 100 mg daily   5. Protein calorie malnutrition: is without change albumin 3.3 will continue supplements as directed weight is 115 pounds .  6. Major depression chronic is stable will continue lexapro 15 mg daily     MD is aware of resident's narcotic use and is in agreement with current plan of care. We will attempt to wean resident as appropriate.  [name] NP [facility]  Contact [phone] Monday through Friday 8am- 5pm  After hours call [phone]

## 2019-08-20 ENCOUNTER — Encounter: Payer: Self-pay | Admitting: Adult Health

## 2019-08-20 ENCOUNTER — Non-Acute Institutional Stay (SKILLED_NURSING_FACILITY): Payer: Medicare Other | Admitting: Adult Health

## 2019-08-20 DIAGNOSIS — F339 Major depressive disorder, recurrent, unspecified: Secondary | ICD-10-CM | POA: Diagnosis not present

## 2019-08-20 DIAGNOSIS — I10 Essential (primary) hypertension: Secondary | ICD-10-CM | POA: Diagnosis not present

## 2019-08-20 DIAGNOSIS — E440 Moderate protein-calorie malnutrition: Secondary | ICD-10-CM | POA: Diagnosis not present

## 2019-08-20 NOTE — Progress Notes (Signed)
Location:    [facility] Room Number: 122/W Place of Service:  SNF (31)   CODE STATUS: DNR  Allergies  Allergen Reactions  . Hydrocodone-Acetaminophen Nausea And Vomiting   Chief Complaint  Patient presents with  . Medical Management of Chronic Issues         Essential hypertension:  Protein calorie malnutrition  Major depression chronic      HPI:  She is a 86 year old long term resident of this facility being seen for the management of her chronic illnesses: Essential hypertension. Protein calorie malnutrition is stable albumin 3.3 will continue supplements as directed  Major depression chronic there are no reports of uncontrolled pain; no changes in appetite; no reports of anxiety or agitation.   Past Medical History:  Diagnosis Date  . Anxiety   . Dementia (HCC)   . GERD (gastroesophageal reflux disease)   . Goiter   . Hypertension   . Osteoarthritis   . Osteoporosis   . Vitamin D insufficiency     Past Surgical History:  Procedure Laterality Date  . APPENDECTOMY    . KIDNEY STONE SURGERY    . ORIF HIP FRACTURE Right 11/20/2012   Procedure: OPEN REDUCTION INTERNAL FIXATION RIGHT HIP;  Surgeon: Darreld Mclean, MD;  Location: [facility];  Service: Orthopedics;  Laterality: Right;  . stress fractures of legs    . TUBAL LIGATION      Social History   Socioeconomic History  . Marital status: Widowed    Spouse name: Not on file  . Number of children: Not on file  . Years of education: Not on file  . Highest education level: Not on file  Occupational History  . Not on file  Tobacco Use  . Smoking status: Never Smoker  . Smokeless tobacco: Never Used  Vaping Use  . Vaping Use: Never used  Substance and Sexual Activity  . Alcohol use: No  . Drug use: No  . Sexual activity: Never  Other Topics Concern  . Not on file  Social History Narrative  . Not on file   Social Determinants of Health   Financial Resource Strain:   . Difficulty of  Paying Living Expenses:   Food Insecurity:   . Worried About Programme researcher, broadcasting/film/video in the Last Year:   . Barista in the Last Year:   Transportation Needs:   . Freight forwarder (Medical):   Marland Kitchen Lack of Transportation (Non-Medical):   Physical Activity:   . Days of Exercise per Week:   . Minutes of Exercise per Session:   Stress:   . Feeling of Stress :   Social Connections:   . Frequency of Communication with Friends and Family:   . Frequency of Social Gatherings with Friends and Family:   . Attends Religious Services:   . Active Member of Clubs or Organizations:   . Attends Banker Meetings:   Marland Kitchen Marital Status:   Intimate Partner Violence:   . Fear of Current or Ex-Partner:   . Emotionally Abused:   Marland Kitchen Physically Abused:   . Sexually Abused:    Family History  Problem Relation Age of Onset  . Heart attack Father   . Heart disease Neg Hx       VITAL SIGNS BP (!) 141/70   Pulse 63   Temp 98.1 F (36.7 C) (Oral)   Resp 18   Ht 5' (1.524 m)   Wt 116 lb (52.6 kg)  SpO2 95%   BMI 22.65 kg/m   Outpatient Encounter Medications as of 08/20/2019  Medication Sig  . acetaminophen (TYLENOL) 500 MG tablet Take 1,000 mg by mouth 3 (three) times daily. For pain  . dextromethorphan-guaiFENesin (TUSSIN DM) 10-100 MG/5ML liquid Take 10 mLs by mouth every 6 (six) hours as needed for cough.  . escitalopram (LEXAPRO) 20 MG tablet Take 15 mg by mouth daily.   Marland Kitchen esomeprazole (NEXIUM) 40 MG capsule Take 40 mg by mouth 2 (two) times daily before a meal.   . feeding supplement, ENSURE ENLIVE, (ENSURE ENLIVE) LIQD Take 237 mLs by mouth 3 (three) times daily between meals.  Marland Kitchen loperamide (IMODIUM) 2 MG capsule Take 2 mg by mouth every 4 (four) hours as needed for diarrhea or loose stools.  Marland Kitchen losartan (COZAAR) 100 MG tablet Take 100 mg by mouth daily.  . magnesium hydroxide (MILK OF MAGNESIA) 400 MG/5ML suspension Take 30 mLs by mouth daily as needed (For Constipation).    . mirtazapine (REMERON) 7.5 MG tablet Take 7.5 mg by mouth at bedtime.  . NON FORMULARY Mechanical soft diet with ground meat  . OXYGEN Inhale 3 L into the lungs continuous.   No facility-administered encounter medications on file as of 08/20/2019.     SIGNIFICANT DIAGNOSTIC EXAMS   PREVIOUS  02-11-19: chest x-ray:  1. Bilateral perihilar and bibasilar streaky densities concerning for developing infiltrate. Follow-up recommended. 2. Probable small bilateral pleural effusions.  02-17-19: ct of chest/angio of chest:  Patchy bilateral ground-glass opacities, most pronounced in the right upper lobe most compatible with pneumonia.     Small bilateral pleural effusions with bilateral lower lobe atelectasis or pneumonia. Cardiomegaly, coronary artery disease. Moderate-sized hiatal hernia. Aortic Atherosclerosis   02-18-19: 2-d echo:   1. Left ventricular ejection fraction, by visual estimation, is 70 to 75%. The left ventricle has hyperdynamic function. There is no left ventricular hypertrophy.  2. Left ventricular diastolic parameters are indeterminate.  3. The left ventricle has no regional wall motion abnormalities.  4. Global right ventricle was not well visualized.The right ventricular size is not well visualized. Right vetricular wall thickness was not assessed.  5. Left atrial size was not well visualized.  6. Right atrial size was not well visualized.  7. The mitral valve was not well visualized. No evidence of mitral valve regurgitation. No evidence of mitral stenosis.  8. The tricuspid valve is not well visualized.  9. The aortic valve was not well visualized. Aortic valve regurgitation is not visualized. Technically difficult assement, grossly there is no significant aortic stenosis. 10. The pulmonic valve was not well visualized. Pulmonic valve regurgitation is not visualized. 11. The interatrial septum was not well visualized. 12. Technically difficult and limited study  NO  NEW EXAMS.    LABS REVIEWED PREVIOUS   02-11-19: wbc 4.2; hgb 12.9; hct 39.8 mcv 96.4 plt 211; glucose 112; bun 25; creat 0.83; k+ 3.1; na++ 134; ca 8.6; liver normal albumin 3.3 blood culture: no growth 02-14-19: glucose 129; bun 16; creat 0.56; k+ 3.8; na++ 140; ca 8.6 hgb a1c 6.3 02-17-19: wbc 7.6; hgb 11.2 hct 35.9; mcv 97.6 plt 256; glucose 114; bun 15; creat 0.48; k+ 4.1; na++ 138; ca 8.7; mag 2.1; tsh 0.369 free t4: 1.18; vit B12: 251 folate 7.3 d-dimer 1.51 02-19-19: wbc 8.6; hgb 11.0; hct 34.0; mcv 94.2 plt 313; glucose 115; bun 16; creat 0.63; k+ 3.6; na++ 134; ca 8.4 mag 1.9  02-26-19: wbc 11.8; hgb 11.9; hct 37.2; mcv 97.6  plt 337; glucose 127; bun 14; creat 0.66; k+ 4.1; na++ 137; ca 8.9;  03-02-19: wbc 4.0; hgb 11.5; hct 36.8; mcv 97.6 plt 286 03-17-19: wbc 9.1; hgb 12.2; hct 39.5; mcv 98.8 plt 343; glucose 156; bun 36; creat 1.06; k+ 3.9; na++ 137; ca 9.4 liver normal albumin 3.2 + guaiac 03-21-19: + guaiac  03-22-19: wbc 5.1; hgb 11.0; hct 36.0; mcv 99.7 plt 265 03-25-19: guaiac neg X 3  04-22-19: tsh 0.317 free T3: 2.9 free T4; 0.98 06-03-19: wbc 6.3; hgb 10.8; hct 34.2; mcv 93.7 plt 185; glucose 117; bun 21; creat 0.67; k+ 3.7; na++ 140; ca 9.0   NO NEW LABS.    Review of Systems  Unable to perform ROS: Dementia (unable to participate )    Physical Exam Constitutional:      General: She is not in acute distress.    Appearance: She is well-developed. She is not diaphoretic.  Neck:     Thyroid: Thyromegaly present.  Cardiovascular:     Rate and Rhythm: Normal rate and regular rhythm.     Heart sounds: Normal heart sounds.  Pulmonary:     Effort: Pulmonary effort is normal. No respiratory distress.     Breath sounds: Normal breath sounds.  Abdominal:     General: Bowel sounds are normal. There is no distension.     Palpations: Abdomen is soft.     Tenderness: There is no abdominal tenderness.  Musculoskeletal:        General: Normal range of motion.     Right lower leg: No  edema.     Left lower leg: No edema.     Comments: Kyphosis   Lymphadenopathy:     Cervical: No cervical adenopathy.  Skin:    General: Skin is warm and dry.  Neurological:     Mental Status: She is alert. Mental status is at baseline.  Psychiatric:        Mood and Affect: Mood normal.      ASSESSMENT/ PLAN:  TODAY  1. Essential hypertension: is stable b/p 141/70 will continue cozaar 100 mg daily   2. Protein calorie malnutrition is stable albumin 3.3 will continue supplements as directed   3. Major depression chronic is stable will continue lexapro 15 mg daily    PREVIOUS    4. GERD without esophagitis: is stable history of lower GI bleed; will continue nexium 40 mg twice daily   5. Dementia without behavioral disturbance unspecified dementia type: is stable weight is presently stable 116 pounds; had been at 121 pounds; is off aricept. Weight loss is an unfortunate outcome at the last stages of this disease process. Is on remeron 7.5 mg nightly for appetite.   6  Primary osteoarthritis multiple joints is stable will continue tylenol 1 gm three times daily         MD is aware of resident's narcotic use and is in agreement with current plan of care. We will attempt to wean resident as appropriate.  [name] NP [facility]  Contact [phone] Monday through Friday 8am- 5pm  After hours call [phone]

## 2019-09-02 ENCOUNTER — Encounter: Payer: Self-pay | Admitting: Adult Health

## 2019-09-02 ENCOUNTER — Non-Acute Institutional Stay (SKILLED_NURSING_FACILITY): Payer: Medicare Other | Admitting: Adult Health

## 2019-09-02 DIAGNOSIS — F0390 Unspecified dementia without behavioral disturbance: Secondary | ICD-10-CM | POA: Diagnosis not present

## 2019-09-02 DIAGNOSIS — I10 Essential (primary) hypertension: Secondary | ICD-10-CM | POA: Diagnosis not present

## 2019-09-02 DIAGNOSIS — F339 Major depressive disorder, recurrent, unspecified: Secondary | ICD-10-CM | POA: Diagnosis not present

## 2019-09-02 NOTE — Progress Notes (Signed)
Location:    [facility] Room Number: 122/W Place of Service:  SNF (31)   CODE STATUS: DNR  Allergies  Allergen Reactions  . Hydrocodone-Acetaminophen Nausea And Vomiting    Chief Complaint  Patient presents with  . Acute Visit    Care Plan Meeting    HPI:  We have come together for her care plan meeting. Unable to do BIMS mood 2/30. There are no reports of falls. She does attend some activities. Her weight is stable remeron remains effective. She requires extensive assistance for her adls and she is incontinent of bladder and bowel. There are no reports of uncontrolled pain; no reports of agitation or anxiety. She continues to be followed for her chronic illnesses including: Dementia without behavioral disturbance unspecified dementia type Essential hypertension. Major depression chronic recurrent  Past Medical History:  Diagnosis Date  . Anxiety   . Dementia (HCC)   . GERD (gastroesophageal reflux disease)   . Goiter   . Hypertension   . Osteoarthritis   . Osteoporosis   . Vitamin D insufficiency     Past Surgical History:  Procedure Laterality Date  . APPENDECTOMY    . KIDNEY STONE SURGERY    . ORIF HIP FRACTURE Right 11/20/2012   Procedure: OPEN REDUCTION INTERNAL FIXATION RIGHT HIP;  Surgeon: Darreld Mclean, MD;  Location: [facility];  Service: Orthopedics;  Laterality: Right;  . stress fractures of legs    . TUBAL LIGATION      Social History   Socioeconomic History  . Marital status: Widowed    Spouse name: Not on file  . Number of children: Not on file  . Years of education: Not on file  . Highest education level: Not on file  Occupational History  . Not on file  Tobacco Use  . Smoking status: Never Smoker  . Smokeless tobacco: Never Used  Vaping Use  . Vaping Use: Never used  Substance and Sexual Activity  . Alcohol use: No  . Drug use: No  . Sexual activity: Never  Other Topics Concern  . Not on file  Social History  Narrative  . Not on file   Social Determinants of Health   Financial Resource Strain:   . Difficulty of Paying Living Expenses:   Food Insecurity:   . Worried About Programme researcher, broadcasting/film/video in the Last Year:   . Barista in the Last Year:   Transportation Needs:   . Freight forwarder (Medical):   Marland Kitchen Lack of Transportation (Non-Medical):   Physical Activity:   . Days of Exercise per Week:   . Minutes of Exercise per Session:   Stress:   . Feeling of Stress :   Social Connections:   . Frequency of Communication with Friends and Family:   . Frequency of Social Gatherings with Friends and Family:   . Attends Religious Services:   . Active Member of Clubs or Organizations:   . Attends Banker Meetings:   Marland Kitchen Marital Status:   Intimate Partner Violence:   . Fear of Current or Ex-Partner:   . Emotionally Abused:   Marland Kitchen Physically Abused:   . Sexually Abused:    Family History  Problem Relation Age of Onset  . Heart attack Father   . Heart disease Neg Hx       VITAL SIGNS BP (!) 138/70   Pulse 62   Temp 97.9 F (36.6 C) (Oral)   Resp 18   Ht  5' (1.524 m)   Wt 116 lb (52.6 kg)   SpO2 97%   BMI 22.65 kg/m   Outpatient Encounter Medications as of 09/02/2019  Medication Sig  . acetaminophen (TYLENOL) 500 MG tablet Take 1,000 mg by mouth 3 (three) times daily. For pain  . dextromethorphan-guaiFENesin (TUSSIN DM) 10-100 MG/5ML liquid Take 10 mLs by mouth every 6 (six) hours as needed for cough.  . escitalopram (LEXAPRO) 20 MG tablet Take 15 mg by mouth daily.   Marland Kitchen esomeprazole (NEXIUM) 40 MG capsule Take 40 mg by mouth 2 (two) times daily before a meal.   . feeding supplement, ENSURE ENLIVE, (ENSURE ENLIVE) LIQD Take 237 mLs by mouth 3 (three) times daily between meals.  Marland Kitchen loperamide (IMODIUM) 2 MG capsule Take 2 mg by mouth every 4 (four) hours as needed for diarrhea or loose stools.  Marland Kitchen losartan (COZAAR) 100 MG tablet Take 100 mg by mouth daily.  . magnesium  hydroxide (MILK OF MAGNESIA) 400 MG/5ML suspension Take 30 mLs by mouth daily as needed (For Constipation).  . mirtazapine (REMERON) 7.5 MG tablet Take 7.5 mg by mouth at bedtime.  . NON FORMULARY Mechanical soft diet with ground meat  . OXYGEN Inhale 3 L into the lungs continuous.   No facility-administered encounter medications on file as of 09/02/2019.     SIGNIFICANT DIAGNOSTIC EXAMS   PREVIOUS  02-11-19: chest x-ray:  1. Bilateral perihilar and bibasilar streaky densities concerning for developing infiltrate. Follow-up recommended. 2. Probable small bilateral pleural effusions.  02-17-19: ct of chest/angio of chest:  Patchy bilateral ground-glass opacities, most pronounced in the right upper lobe most compatible with pneumonia.     Small bilateral pleural effusions with bilateral lower lobe atelectasis or pneumonia. Cardiomegaly, coronary artery disease. Moderate-sized hiatal hernia. Aortic Atherosclerosis   02-18-19: 2-d echo:   1. Left ventricular ejection fraction, by visual estimation, is 70 to 75%. The left ventricle has hyperdynamic function. There is no left ventricular hypertrophy.  2. Left ventricular diastolic parameters are indeterminate.  3. The left ventricle has no regional wall motion abnormalities.  4. Global right ventricle was not well visualized.The right ventricular size is not well visualized. Right vetricular wall thickness was not assessed.  5. Left atrial size was not well visualized.  6. Right atrial size was not well visualized.  7. The mitral valve was not well visualized. No evidence of mitral valve regurgitation. No evidence of mitral stenosis.  8. The tricuspid valve is not well visualized.  9. The aortic valve was not well visualized. Aortic valve regurgitation is not visualized. Technically difficult assement, grossly there is no significant aortic stenosis. 10. The pulmonic valve was not well visualized. Pulmonic valve regurgitation is not  visualized. 11. The interatrial septum was not well visualized. 12. Technically difficult and limited study  NO NEW EXAMS.    LABS REVIEWED PREVIOUS   02-11-19: wbc 4.2; hgb 12.9; hct 39.8 mcv 96.4 plt 211; glucose 112; bun 25; creat 0.83; k+ 3.1; na++ 134; ca 8.6; liver normal albumin 3.3 blood culture: no growth 02-14-19: glucose 129; bun 16; creat 0.56; k+ 3.8; na++ 140; ca 8.6 hgb a1c 6.3 02-17-19: wbc 7.6; hgb 11.2 hct 35.9; mcv 97.6 plt 256; glucose 114; bun 15; creat 0.48; k+ 4.1; na++ 138; ca 8.7; mag 2.1; tsh 0.369 free t4: 1.18; vit B12: 251 folate 7.3 d-dimer 1.51 02-19-19: wbc 8.6; hgb 11.0; hct 34.0; mcv 94.2 plt 313; glucose 115; bun 16; creat 0.63; k+ 3.6; na++ 134; ca 8.4 mag  1.9  02-26-19: wbc 11.8; hgb 11.9; hct 37.2; mcv 97.6 plt 337; glucose 127; bun 14; creat 0.66; k+ 4.1; na++ 137; ca 8.9;  03-02-19: wbc 4.0; hgb 11.5; hct 36.8; mcv 97.6 plt 286 03-17-19: wbc 9.1; hgb 12.2; hct 39.5; mcv 98.8 plt 343; glucose 156; bun 36; creat 1.06; k+ 3.9; na++ 137; ca 9.4 liver normal albumin 3.2 + guaiac 03-21-19: + guaiac  03-22-19: wbc 5.1; hgb 11.0; hct 36.0; mcv 99.7 plt 265 03-25-19: guaiac neg X 3  04-22-19: tsh 0.317 free T3: 2.9 free T4; 0.98 06-03-19: wbc 6.3; hgb 10.8; hct 34.2; mcv 93.7 plt 185; glucose 117; bun 21; creat 0.67; k+ 3.7; na++ 140; ca 9.0   NO NEW LABS.   Review of Systems  Unable to perform ROS: Dementia (uanbl to participate )   Physical Exam Constitutional:      General: She is not in acute distress.    Appearance: She is well-developed. She is not diaphoretic.  Neck:     Thyroid: Thyromegaly present.  Cardiovascular:     Rate and Rhythm: Normal rate and regular rhythm.     Pulses: Normal pulses.     Heart sounds: Normal heart sounds.  Pulmonary:     Effort: Pulmonary effort is normal. No respiratory distress.     Breath sounds: Normal breath sounds.  Abdominal:     General: Bowel sounds are normal. There is no distension.     Palpations: Abdomen is  soft.     Tenderness: There is no abdominal tenderness.  Musculoskeletal:        General: Normal range of motion.     Right lower leg: No edema.     Left lower leg: No edema.     Comments: Kyphosis   Lymphadenopathy:     Cervical: No cervical adenopathy.  Skin:    General: Skin is warm and dry.  Neurological:     Mental Status: She is alert. Mental status is at baseline.  Psychiatric:        Mood and Affect: Mood normal.      ASSESSMENT/ PLAN:  TODAY  1.Dementia without behavioral disturbance unspecified dementia type 2. Essential hypertension 3. Major depression chronic recurrent  Will continue current medications Will continue current plan of care Will continue to monitor her status.    MD is aware of resident's narcotic use and is in agreement with current plan of care. We will attempt to wean resident as appropriate.  [name] NP [facility]  Contact [phone] Monday through Friday 8am- 5pm  After hours call [phone]

## 2019-09-08 DIAGNOSIS — H43813 Vitreous degeneration, bilateral: Secondary | ICD-10-CM | POA: Diagnosis not present

## 2019-09-08 DIAGNOSIS — H2513 Age-related nuclear cataract, bilateral: Secondary | ICD-10-CM | POA: Diagnosis not present

## 2019-09-08 DIAGNOSIS — H524 Presbyopia: Secondary | ICD-10-CM | POA: Diagnosis not present

## 2019-09-23 DIAGNOSIS — J9601 Acute respiratory failure with hypoxia: Secondary | ICD-10-CM | POA: Diagnosis not present

## 2019-09-23 DIAGNOSIS — Z1159 Encounter for screening for other viral diseases: Secondary | ICD-10-CM | POA: Diagnosis not present

## 2019-09-24 ENCOUNTER — Encounter: Payer: Self-pay | Admitting: Adult Health

## 2019-09-24 ENCOUNTER — Non-Acute Institutional Stay (SKILLED_NURSING_FACILITY): Payer: Medicare Other | Admitting: Adult Health

## 2019-09-24 DIAGNOSIS — K219 Gastro-esophageal reflux disease without esophagitis: Secondary | ICD-10-CM | POA: Diagnosis not present

## 2019-09-24 DIAGNOSIS — M8949 Other hypertrophic osteoarthropathy, multiple sites: Secondary | ICD-10-CM | POA: Diagnosis not present

## 2019-09-24 DIAGNOSIS — F0390 Unspecified dementia without behavioral disturbance: Secondary | ICD-10-CM

## 2019-09-24 DIAGNOSIS — M159 Polyosteoarthritis, unspecified: Secondary | ICD-10-CM

## 2019-09-24 NOTE — Progress Notes (Signed)
Location:    [facility] Room Number: 122/W Place of Service:  SNF (31)   CODE STATUS: DNR  Allergies  Allergen Reactions   Hydrocodone-Acetaminophen Nausea And Vomiting    Chief Complaint  Patient presents with   Medical Management of Chronic Issues         GERD without esophagitis     Dementia without behavioral disturbance unspecified dementia type:  Primary osteoarthritis multiple joints:    HPI:  She is a 86 year old long term resident of this facility being seen for the management of her chronic illnesses: GERD without esophagitis Dementia without behavioral disturbance unspecified dementia type:   Primary osteoarthritis multiple joints:. There are no reports of uncontrolled pain; her appetite and weight are stable; no reports of agitation or anxiety.   Past Medical History:  Diagnosis Date   Anxiety    Dementia (HCC)    GERD (gastroesophageal reflux disease)    Goiter    Hypertension    Osteoarthritis    Osteoporosis    Vitamin D insufficiency     Past Surgical History:  Procedure Laterality Date   APPENDECTOMY     KIDNEY STONE SURGERY     ORIF HIP FRACTURE Right 11/20/2012   Procedure: OPEN REDUCTION INTERNAL FIXATION RIGHT HIP;  Surgeon: Darreld Mclean, MD;  Location: [facility];  Service: Orthopedics;  Laterality: Right;   stress fractures of legs     TUBAL LIGATION      Social History   Socioeconomic History   Marital status: Widowed    Spouse name: Not on file   Number of children: Not on file   Years of education: Not on file   Highest education level: Not on file  Occupational History   Not on file  Tobacco Use   Smoking status: Never Smoker   Smokeless tobacco: Never Used  Vaping Use   Vaping Use: Never used  Substance and Sexual Activity   Alcohol use: No   Drug use: No   Sexual activity: Never  Other Topics Concern   Not on file  Social History Narrative   Not on file   Social  Determinants of Health   Financial Resource Strain:    Difficulty of Paying Living Expenses: Not on file  Food Insecurity:    Worried About Running Out of Food in the Last Year: Not on file   Ran Out of Food in the Last Year: Not on file  Transportation Needs:    Lack of Transportation (Medical): Not on file   Lack of Transportation (Non-Medical): Not on file  Physical Activity:    Days of Exercise per Week: Not on file   Minutes of Exercise per Session: Not on file  Stress:    Feeling of Stress : Not on file  Social Connections:    Frequency of Communication with Friends and Family: Not on file   Frequency of Social Gatherings with Friends and Family: Not on file   Attends Religious Services: Not on file   Active Member of Clubs or Organizations: Not on file   Attends Banker Meetings: Not on file   Marital Status: Not on file  Intimate Partner Violence:    Fear of Current or Ex-Partner: Not on file   Emotionally Abused: Not on file   Physically Abused: Not on file   Sexually Abused: Not on file   Family History  Problem Relation Age of Onset   Heart attack Father    Heart  disease Neg Hx       VITAL SIGNS BP 119/69    Pulse 62    Temp 97.6 F (36.4 C) (Oral)    Resp 18    Ht 5' (1.524 m)    Wt 115 lb (52.2 kg)    SpO2 96%    BMI 22.46 kg/m   Outpatient Encounter Medications as of 09/24/2019  Medication Sig   acetaminophen (TYLENOL) 500 MG tablet Take 1,000 mg by mouth 3 (three) times daily. For pain   dextromethorphan-guaiFENesin (TUSSIN DM) 10-100 MG/5ML liquid Take 10 mLs by mouth every 6 (six) hours as needed for cough.   escitalopram (LEXAPRO) 20 MG tablet Take 15 mg by mouth daily.    esomeprazole (NEXIUM) 40 MG capsule Take 40 mg by mouth 2 (two) times daily before a meal.    feeding supplement, ENSURE ENLIVE, (ENSURE ENLIVE) LIQD Take 237 mLs by mouth 3 (three) times daily between meals.   loperamide (IMODIUM) 2 MG capsule  Take 2 mg by mouth every 4 (four) hours as needed for diarrhea or loose stools.   losartan (COZAAR) 100 MG tablet Take 100 mg by mouth daily.   magnesium hydroxide (MILK OF MAGNESIA) 400 MG/5ML suspension Take 30 mLs by mouth daily as needed (For Constipation).   mirtazapine (REMERON) 7.5 MG tablet Take 7.5 mg by mouth at bedtime.   NON FORMULARY Mechanical soft diet with ground meat   OXYGEN Inhale 3 L into the lungs continuous.   No facility-administered encounter medications on file as of 09/24/2019.     SIGNIFICANT DIAGNOSTIC EXAMS   PREVIOUS  02-11-19: chest x-ray:  1. Bilateral perihilar and bibasilar streaky densities concerning for developing infiltrate. Follow-up recommended. 2. Probable small bilateral pleural effusions.  02-17-19: ct of chest/angio of chest:  Patchy bilateral ground-glass opacities, most pronounced in the right upper lobe most compatible with pneumonia.     Small bilateral pleural effusions with bilateral lower lobe atelectasis or pneumonia. Cardiomegaly, coronary artery disease. Moderate-sized hiatal hernia. Aortic Atherosclerosis   02-18-19: 2-d echo:   1. Left ventricular ejection fraction, by visual estimation, is 70 to 75%. The left ventricle has hyperdynamic function. There is no left ventricular hypertrophy.  2. Left ventricular diastolic parameters are indeterminate.  3. The left ventricle has no regional wall motion abnormalities.  4. Global right ventricle was not well visualized.The right ventricular size is not well visualized. Right vetricular wall thickness was not assessed.  5. Left atrial size was not well visualized.  6. Right atrial size was not well visualized.  7. The mitral valve was not well visualized. No evidence of mitral valve regurgitation. No evidence of mitral stenosis.  8. The tricuspid valve is not well visualized.  9. The aortic valve was not well visualized. Aortic valve regurgitation is not visualized. Technically  difficult assement, grossly there is no significant aortic stenosis. 10. The pulmonic valve was not well visualized. Pulmonic valve regurgitation is not visualized. 11. The interatrial septum was not well visualized. 12. Technically difficult and limited study  NO NEW EXAMS.    LABS REVIEWED PREVIOUS   02-11-19: wbc 4.2; hgb 12.9; hct 39.8 mcv 96.4 plt 211; glucose 112; bun 25; creat 0.83; k+ 3.1; na++ 134; ca 8.6; liver normal albumin 3.3 blood culture: no growth 02-14-19: glucose 129; bun 16; creat 0.56; k+ 3.8; na++ 140; ca 8.6 hgb a1c 6.3 02-17-19: wbc 7.6; hgb 11.2 hct 35.9; mcv 97.6 plt 256; glucose 114; bun 15; creat 0.48; k+ 4.1; na++ 138; ca  8.7; mag 2.1; tsh 0.369 free t4: 1.18; vit B12: 251 folate 7.3 d-dimer 1.51 02-19-19: wbc 8.6; hgb 11.0; hct 34.0; mcv 94.2 plt 313; glucose 115; bun 16; creat 0.63; k+ 3.6; na++ 134; ca 8.4 mag 1.9  02-26-19: wbc 11.8; hgb 11.9; hct 37.2; mcv 97.6 plt 337; glucose 127; bun 14; creat 0.66; k+ 4.1; na++ 137; ca 8.9;  03-02-19: wbc 4.0; hgb 11.5; hct 36.8; mcv 97.6 plt 286 03-17-19: wbc 9.1; hgb 12.2; hct 39.5; mcv 98.8 plt 343; glucose 156; bun 36; creat 1.06; k+ 3.9; na++ 137; ca 9.4 liver normal albumin 3.2 + guaiac 03-21-19: + guaiac  03-22-19: wbc 5.1; hgb 11.0; hct 36.0; mcv 99.7 plt 265 03-25-19: guaiac neg X 3  04-22-19: tsh 0.317 free T3: 2.9 free T4; 0.98 06-03-19: wbc 6.3; hgb 10.8; hct 34.2; mcv 93.7 plt 185; glucose 117; bun 21; creat 0.67; k+ 3.7; na++ 140; ca 9.0   NO NEW LABS.    Review of Systems  Unable to perform ROS: Dementia (unable to participate )    Physical Exam Constitutional:      General: She is not in acute distress.    Appearance: She is well-developed. She is not diaphoretic.  Neck:     Thyroid: Thyromegaly present.  Cardiovascular:     Rate and Rhythm: Normal rate and regular rhythm.     Heart sounds: Normal heart sounds.  Pulmonary:     Effort: Pulmonary effort is normal. No respiratory distress.     Breath  sounds: Normal breath sounds.  Abdominal:     General: Bowel sounds are normal. There is no distension.     Palpations: Abdomen is soft.     Tenderness: There is no abdominal tenderness.  Musculoskeletal:        General: Normal range of motion.     Right lower leg: No edema.     Left lower leg: No edema.     Comments: Kyphosis   Lymphadenopathy:     Cervical: No cervical adenopathy.  Skin:    General: Skin is warm and dry.  Neurological:     Mental Status: She is alert. Mental status is at baseline.  Psychiatric:        Mood and Affect: Mood normal.       ASSESSMENT/ PLAN:  TODAY  1. GERD without esophagitis is stable has history of GI bleed; will continue nexium 40 mg twice daily  2. Dementia without behavioral disturbance unspecified dementia type: is stable weight is 115 pounds is off aricept; is on remeron to help with appetite.   3. Primary osteoarthritis multiple joints: is stable will continue tylenol 1 gm three times daily   PREVIOUS   4. Essential hypertension: is stable b/p 119/69 will continue cozaar 100 mg daily   5. Protein calorie malnutrition is stable albumin 3.3 will continue supplements as directed   6. Major depression chronic is stable will continue lexapro 15 mg daily     MD is aware of resident's narcotic use and is in agreement with current plan of care. We will attempt to wean resident as appropriate.  [name] NP [facility]  Contact [phone] Monday through Friday 8am- 5pm  After hours call [phone]

## 2019-09-27 DIAGNOSIS — J9601 Acute respiratory failure with hypoxia: Secondary | ICD-10-CM | POA: Diagnosis not present

## 2019-09-27 DIAGNOSIS — Z1159 Encounter for screening for other viral diseases: Secondary | ICD-10-CM | POA: Diagnosis not present

## 2019-10-01 ENCOUNTER — Non-Acute Institutional Stay: Payer: Self-pay | Admitting: Adult Health

## 2019-10-01 ENCOUNTER — Encounter: Payer: Self-pay | Admitting: Adult Health

## 2019-10-01 DIAGNOSIS — F0390 Unspecified dementia without behavioral disturbance: Secondary | ICD-10-CM

## 2019-10-01 DIAGNOSIS — F339 Major depressive disorder, recurrent, unspecified: Secondary | ICD-10-CM | POA: Diagnosis not present

## 2019-10-01 DIAGNOSIS — B351 Tinea unguium: Secondary | ICD-10-CM | POA: Diagnosis not present

## 2019-10-01 DIAGNOSIS — I739 Peripheral vascular disease, unspecified: Secondary | ICD-10-CM | POA: Diagnosis not present

## 2019-10-01 DIAGNOSIS — M2042 Other hammer toe(s) (acquired), left foot: Secondary | ICD-10-CM | POA: Diagnosis not present

## 2019-10-01 DIAGNOSIS — M2041 Other hammer toe(s) (acquired), right foot: Secondary | ICD-10-CM | POA: Diagnosis not present

## 2019-10-01 DIAGNOSIS — L603 Nail dystrophy: Secondary | ICD-10-CM | POA: Diagnosis not present

## 2019-10-05 NOTE — Patient Instructions (Signed)
Location:   [facility] Room Number: 122/W Place of Service:  SNF (31)   CODE STATUS: dnr  Allergies  Allergen Reactions   Hydrocodone-Acetaminophen Nausea And Vomiting    Chief Complaint  Patient presents with   Acute Visit    medication review     HPI:    Past Medical History:  Diagnosis Date   Anxiety    Dementia (HCC)    GERD (gastroesophageal reflux disease)    Goiter    Hypertension    Osteoarthritis    Osteoporosis    Vitamin D insufficiency     Past Surgical History:  Procedure Laterality Date   APPENDECTOMY     KIDNEY STONE SURGERY     ORIF HIP FRACTURE Right 11/20/2012   Procedure: OPEN REDUCTION INTERNAL FIXATION RIGHT HIP;  Surgeon: Darreld Mclean, MD;  Location: [facility];  Service: Orthopedics;  Laterality: Right;   stress fractures of legs     TUBAL LIGATION      Social History   Socioeconomic History   Marital status: Widowed    Spouse name: Not on file   Number of children: Not on file   Years of education: Not on file   Highest education level: Not on file  Occupational History   Not on file  Tobacco Use   Smoking status: Never Smoker   Smokeless tobacco: Never Used  Vaping Use   Vaping Use: Never used  Substance and Sexual Activity   Alcohol use: No   Drug use: No   Sexual activity: Never  Other Topics Concern   Not on file  Social History Narrative   Not on file   Social Determinants of Health   Financial Resource Strain:    Difficulty of Paying Living Expenses: Not on file  Food Insecurity:    Worried About Running Out of Food in the Last Year: Not on file   Ran Out of Food in the Last Year: Not on file  Transportation Needs:    Lack of Transportation (Medical): Not on file   Lack of Transportation (Non-Medical): Not on file  Physical Activity:    Days of Exercise per Week: Not on file   Minutes of Exercise per Session: Not on file  Stress:    Feeling of Stress : Not on  file  Social Connections:    Frequency of Communication with Friends and Family: Not on file   Frequency of Social Gatherings with Friends and Family: Not on file   Attends Religious Services: Not on file   Active Member of Clubs or Organizations: Not on file   Attends Banker Meetings: Not on file   Marital Status: Not on file  Intimate Partner Violence:    Fear of Current or Ex-Partner: Not on file   Emotionally Abused: Not on file   Physically Abused: Not on file   Sexually Abused: Not on file   Family History  Problem Relation Age of Onset   Heart attack Father    Heart disease Neg Hx       VITAL SIGNS BP 118/60    Pulse 62    Temp 98.4 F (36.9 C) (Oral)    Resp 18    Ht 5' (1.524 m)    Wt 115 lb (52.2 kg)    SpO2 94%    BMI 22.46 kg/m   Outpatient Encounter Medications as of 10/01/2019  Medication Sig   acetaminophen (TYLENOL) 500 MG tablet Take 1,000 mg by mouth 3 (  three) times daily. For pain   dextromethorphan-guaiFENesin (TUSSIN DM) 10-100 MG/5ML liquid Take 10 mLs by mouth every 6 (six) hours as needed for cough.   escitalopram (LEXAPRO) 20 MG tablet Take 15 mg by mouth daily.    esomeprazole (NEXIUM) 40 MG capsule Take 40 mg by mouth 2 (two) times daily before a meal.    feeding supplement, ENSURE ENLIVE, (ENSURE ENLIVE) LIQD Take 237 mLs by mouth 3 (three) times daily between meals.   loperamide (IMODIUM) 2 MG capsule Take 2 mg by mouth every 4 (four) hours as needed for diarrhea or loose stools.   losartan (COZAAR) 100 MG tablet Take 100 mg by mouth daily.   magnesium hydroxide (MILK OF MAGNESIA) 400 MG/5ML suspension Take 30 mLs by mouth daily as needed (For Constipation).   mirtazapine (REMERON) 7.5 MG tablet Take 7.5 mg by mouth at bedtime.   NON FORMULARY Mechanical soft diet with ground meat   OXYGEN Inhale 3 L into the lungs continuous.   No facility-administered encounter medications on file as of 10/01/2019.      SIGNIFICANT DIAGNOSTIC EXAMS   PREVIOUS  02-11-19: chest x-ray:  1. Bilateral perihilar and bibasilar streaky densities concerning for developing infiltrate. Follow-up recommended. 2. Probable small bilateral pleural effusions.  02-17-19: ct of chest/angio of chest:  Patchy bilateral ground-glass opacities, most pronounced in the right upper lobe most compatible with pneumonia.     Small bilateral pleural effusions with bilateral lower lobe atelectasis or pneumonia. Cardiomegaly, coronary artery disease. Moderate-sized hiatal hernia. Aortic Atherosclerosis   02-18-19: 2-d echo:   1. Left ventricular ejection fraction, by visual estimation, is 70 to 75%. The left ventricle has hyperdynamic function. There is no left ventricular hypertrophy.  2. Left ventricular diastolic parameters are indeterminate.  3. The left ventricle has no regional wall motion abnormalities.  4. Global right ventricle was not well visualized.The right ventricular size is not well visualized. Right vetricular wall thickness was not assessed.  5. Left atrial size was not well visualized.  6. Right atrial size was not well visualized.  7. The mitral valve was not well visualized. No evidence of mitral valve regurgitation. No evidence of mitral stenosis.  8. The tricuspid valve is not well visualized.  9. The aortic valve was not well visualized. Aortic valve regurgitation is not visualized. Technically difficult assement, grossly there is no significant aortic stenosis. 10. The pulmonic valve was not well visualized. Pulmonic valve regurgitation is not visualized. 11. The interatrial septum was not well visualized. 12. Technically difficult and limited study  NO NEW EXAMS.    LABS REVIEWED PREVIOUS   02-11-19: wbc 4.2; hgb 12.9; hct 39.8 mcv 96.4 plt 211; glucose 112; bun 25; creat 0.83; k+ 3.1; na++ 134; ca 8.6; liver normal albumin 3.3 blood culture: no growth 02-14-19: glucose 129; bun 16; creat 0.56; k+  3.8; na++ 140; ca 8.6 hgb a1c 6.3 02-17-19: wbc 7.6; hgb 11.2 hct 35.9; mcv 97.6 plt 256; glucose 114; bun 15; creat 0.48; k+ 4.1; na++ 138; ca 8.7; mag 2.1; tsh 0.369 free t4: 1.18; vit B12: 251 folate 7.3 d-dimer 1.51 02-19-19: wbc 8.6; hgb 11.0; hct 34.0; mcv 94.2 plt 313; glucose 115; bun 16; creat 0.63; k+ 3.6; na++ 134; ca 8.4 mag 1.9  02-26-19: wbc 11.8; hgb 11.9; hct 37.2; mcv 97.6 plt 337; glucose 127; bun 14; creat 0.66; k+ 4.1; na++ 137; ca 8.9;  03-02-19: wbc 4.0; hgb 11.5; hct 36.8; mcv 97.6 plt 286 03-17-19: wbc 9.1; hgb 12.2; hct  39.5; mcv 98.8 plt 343; glucose 156; bun 36; creat 1.06; k+ 3.9; na++ 137; ca 9.4 liver normal albumin 3.2 + guaiac 03-21-19: + guaiac  03-22-19: wbc 5.1; hgb 11.0; hct 36.0; mcv 99.7 plt 265 03-25-19: guaiac neg X 3  04-22-19: tsh 0.317 free T3: 2.9 free T4; 0.98 06-03-19: wbc 6.3; hgb 10.8; hct 34.2; mcv 93.7 plt 185; glucose 117; bun 21; creat 0.67; k+ 3.7; na++ 140; ca 9.0   NO NEW LABS.   Marland Kitchenros     ASSESSMENT/ PLAN:    MD is aware of resident's narcotic use and is in agreement with current plan of care. We will attempt to wean resident as appropriate.  [name] NP [facility]  Contact [phone] Monday through Friday 8am- 5pm  After hours call [phone]

## 2019-10-05 NOTE — Progress Notes (Signed)
Location:   [facility] Room Number: 122/W Place of Service:  SNF (31)   CODE STATUS: dnr  Allergies  Allergen Reactions  . Hydrocodone-Acetaminophen Nausea And Vomiting    Chief Complaint  Patient presents with  . Acute Visit    medication review     HPI:  She is presently taking lexapro 15 mg daily. She was started on remeron 7.5 mg nightly for appetite; when she completed the 30 day coarse her appetite declined once again. Her weight is stable at this time. There are no reports of agitation; no anxiety; no reports of uncontrolled pain.   Past Medical History:  Diagnosis Date  . Anxiety   . Dementia (HCC)   . GERD (gastroesophageal reflux disease)   . Goiter   . Hypertension   . Osteoarthritis   . Osteoporosis   . Vitamin D insufficiency     Past Surgical History:  Procedure Laterality Date  . APPENDECTOMY    . KIDNEY STONE SURGERY    . ORIF HIP FRACTURE Right 11/20/2012   Procedure: OPEN REDUCTION INTERNAL FIXATION RIGHT HIP;  Surgeon: Darreld Mclean, MD;  Location: [facility];  Service: Orthopedics;  Laterality: Right;  . stress fractures of legs    . TUBAL LIGATION      Social History   Socioeconomic History  . Marital status: Widowed    Spouse name: Not on file  . Number of children: Not on file  . Years of education: Not on file  . Highest education level: Not on file  Occupational History  . Not on file  Tobacco Use  . Smoking status: Never Smoker  . Smokeless tobacco: Never Used  Vaping Use  . Vaping Use: Never used  Substance and Sexual Activity  . Alcohol use: No  . Drug use: No  . Sexual activity: Never  Other Topics Concern  . Not on file  Social History Narrative  . Not on file   Social Determinants of Health   Financial Resource Strain:   . Difficulty of Paying Living Expenses: Not on file  Food Insecurity:   . Worried About Programme researcher, broadcasting/film/video in the Last Year: Not on file  . Ran Out of Food in the Last Year: Not on file    Transportation Needs:   . Lack of Transportation (Medical): Not on file  . Lack of Transportation (Non-Medical): Not on file  Physical Activity:   . Days of Exercise per Week: Not on file  . Minutes of Exercise per Session: Not on file  Stress:   . Feeling of Stress : Not on file  Social Connections:   . Frequency of Communication with Friends and Family: Not on file  . Frequency of Social Gatherings with Friends and Family: Not on file  . Attends Religious Services: Not on file  . Active Member of Clubs or Organizations: Not on file  . Attends Banker Meetings: Not on file  . Marital Status: Not on file  Intimate Partner Violence:   . Fear of Current or Ex-Partner: Not on file  . Emotionally Abused: Not on file  . Physically Abused: Not on file  . Sexually Abused: Not on file   Family History  Problem Relation Age of Onset  . Heart attack Father   . Heart disease Neg Hx       VITAL SIGNS BP 118/60   Pulse 62   Temp 98.4 F (36.9 C) (Oral)   Resp 18  Ht 5' (1.524 m)   Wt 115 lb (52.2 kg)   SpO2 94%   BMI 22.46 kg/m   Outpatient Encounter Medications as of 10/01/2019  Medication Sig  . acetaminophen (TYLENOL) 500 MG tablet Take 1,000 mg by mouth 3 (three) times daily. For pain  . dextromethorphan-guaiFENesin (TUSSIN DM) 10-100 MG/5ML liquid Take 10 mLs by mouth every 6 (six) hours as needed for cough.  . escitalopram (LEXAPRO) 20 MG tablet Take 15 mg by mouth daily.   Marland Kitchen esomeprazole (NEXIUM) 40 MG capsule Take 40 mg by mouth 2 (two) times daily before a meal.   . feeding supplement, ENSURE ENLIVE, (ENSURE ENLIVE) LIQD Take 237 mLs by mouth 3 (three) times daily between meals.  Marland Kitchen loperamide (IMODIUM) 2 MG capsule Take 2 mg by mouth every 4 (four) hours as needed for diarrhea or loose stools.  Marland Kitchen losartan (COZAAR) 100 MG tablet Take 100 mg by mouth daily.  . magnesium hydroxide (MILK OF MAGNESIA) 400 MG/5ML suspension Take 30 mLs by mouth daily as needed  (For Constipation).  . mirtazapine (REMERON) 7.5 MG tablet Take 7.5 mg by mouth at bedtime.  . NON FORMULARY Mechanical soft diet with ground meat  . OXYGEN Inhale 3 L into the lungs continuous.   No facility-administered encounter medications on file as of 10/01/2019.     SIGNIFICANT DIAGNOSTIC EXAMS   PREVIOUS  02-11-19: chest x-ray:  1. Bilateral perihilar and bibasilar streaky densities concerning for developing infiltrate. Follow-up recommended. 2. Probable small bilateral pleural effusions.  02-17-19: ct of chest/angio of chest:  Patchy bilateral ground-glass opacities, most pronounced in the right upper lobe most compatible with pneumonia.     Small bilateral pleural effusions with bilateral lower lobe atelectasis or pneumonia. Cardiomegaly, coronary artery disease. Moderate-sized hiatal hernia. Aortic Atherosclerosis   02-18-19: 2-d echo:   1. Left ventricular ejection fraction, by visual estimation, is 70 to 75%. The left ventricle has hyperdynamic function. There is no left ventricular hypertrophy.  2. Left ventricular diastolic parameters are indeterminate.  3. The left ventricle has no regional wall motion abnormalities.  4. Global right ventricle was not well visualized.The right ventricular size is not well visualized. Right vetricular wall thickness was not assessed.  5. Left atrial size was not well visualized.  6. Right atrial size was not well visualized.  7. The mitral valve was not well visualized. No evidence of mitral valve regurgitation. No evidence of mitral stenosis.  8. The tricuspid valve is not well visualized.  9. The aortic valve was not well visualized. Aortic valve regurgitation is not visualized. Technically difficult assement, grossly there is no significant aortic stenosis. 10. The pulmonic valve was not well visualized. Pulmonic valve regurgitation is not visualized. 11. The interatrial septum was not well visualized. 12. Technically difficult and  limited study  NO NEW EXAMS.    LABS REVIEWED PREVIOUS   02-11-19: wbc 4.2; hgb 12.9; hct 39.8 mcv 96.4 plt 211; glucose 112; bun 25; creat 0.83; k+ 3.1; na++ 134; ca 8.6; liver normal albumin 3.3 blood culture: no growth 02-14-19: glucose 129; bun 16; creat 0.56; k+ 3.8; na++ 140; ca 8.6 hgb a1c 6.3 02-17-19: wbc 7.6; hgb 11.2 hct 35.9; mcv 97.6 plt 256; glucose 114; bun 15; creat 0.48; k+ 4.1; na++ 138; ca 8.7; mag 2.1; tsh 0.369 free t4: 1.18; vit B12: 251 folate 7.3 d-dimer 1.51 02-19-19: wbc 8.6; hgb 11.0; hct 34.0; mcv 94.2 plt 313; glucose 115; bun 16; creat 0.63; k+ 3.6; na++ 134; ca 8.4  mag 1.9  02-26-19: wbc 11.8; hgb 11.9; hct 37.2; mcv 97.6 plt 337; glucose 127; bun 14; creat 0.66; k+ 4.1; na++ 137; ca 8.9;  03-02-19: wbc 4.0; hgb 11.5; hct 36.8; mcv 97.6 plt 286 03-17-19: wbc 9.1; hgb 12.2; hct 39.5; mcv 98.8 plt 343; glucose 156; bun 36; creat 1.06; k+ 3.9; na++ 137; ca 9.4 liver normal albumin 3.2 + guaiac 03-21-19: + guaiac  03-22-19: wbc 5.1; hgb 11.0; hct 36.0; mcv 99.7 plt 265 03-25-19: guaiac neg X 3  04-22-19: tsh 0.317 free T3: 2.9 free T4; 0.98 06-03-19: wbc 6.3; hgb 10.8; hct 34.2; mcv 93.7 plt 185; glucose 117; bun 21; creat 0.67; k+ 3.7; na++ 140; ca 9.0   NO NEW LABS.   Review of Systems  Unable to perform ROS: Dementia (unable to participate)    Physical Exam Constitutional:      General: She is not in acute distress.    Appearance: She is well-developed. She is not diaphoretic.  Neck:     Thyroid: Thyromegaly present.  Cardiovascular:     Rate and Rhythm: Normal rate and regular rhythm.     Heart sounds: Normal heart sounds.  Pulmonary:     Effort: Pulmonary effort is normal. No respiratory distress.     Breath sounds: Normal breath sounds.  Abdominal:     General: Bowel sounds are normal. There is no distension.     Palpations: Abdomen is soft.     Tenderness: There is no abdominal tenderness.  Musculoskeletal:        General: Normal range of motion.      Cervical back: Neck supple.     Right lower leg: No edema.     Left lower leg: No edema.     Comments: Kyphosis   Lymphadenopathy:     Cervical: No cervical adenopathy.  Skin:    General: Skin is warm and dry.  Neurological:     Mental Status: She is alert. Mental status is at baseline.  Psychiatric:        Mood and Affect: Mood normal.       ASSESSMENT/ PLAN:  TODAY  1. Major depression recurrent chronic 2. Dementia without behavioral disturbance unspecified dementia type  Will continue lexapro 15 mg daily  Will continue remeron 7.5 mg nightly  Will continue to monitor her status.   MD is aware of resident's narcotic use and is in agreement with current plan of care. We will attempt to wean resident as appropriate.  [name] NP [facility]  Contact [phone] Monday through Friday 8am- 5pm  After hours call [phone]

## 2019-10-27 DIAGNOSIS — R279 Unspecified lack of coordination: Secondary | ICD-10-CM | POA: Diagnosis not present

## 2019-10-27 DIAGNOSIS — M6281 Muscle weakness (generalized): Secondary | ICD-10-CM | POA: Diagnosis not present

## 2019-10-27 DIAGNOSIS — M159 Polyosteoarthritis, unspecified: Secondary | ICD-10-CM | POA: Diagnosis not present

## 2019-10-27 DIAGNOSIS — J9611 Chronic respiratory failure with hypoxia: Secondary | ICD-10-CM | POA: Diagnosis not present

## 2019-10-28 DIAGNOSIS — J9611 Chronic respiratory failure with hypoxia: Secondary | ICD-10-CM | POA: Diagnosis not present

## 2019-10-28 DIAGNOSIS — M6281 Muscle weakness (generalized): Secondary | ICD-10-CM | POA: Diagnosis not present

## 2019-10-28 DIAGNOSIS — M159 Polyosteoarthritis, unspecified: Secondary | ICD-10-CM | POA: Diagnosis not present

## 2019-10-28 DIAGNOSIS — R279 Unspecified lack of coordination: Secondary | ICD-10-CM | POA: Diagnosis not present

## 2019-10-29 DIAGNOSIS — M6281 Muscle weakness (generalized): Secondary | ICD-10-CM | POA: Diagnosis not present

## 2019-10-29 DIAGNOSIS — M159 Polyosteoarthritis, unspecified: Secondary | ICD-10-CM | POA: Diagnosis not present

## 2019-10-29 DIAGNOSIS — R279 Unspecified lack of coordination: Secondary | ICD-10-CM | POA: Diagnosis not present

## 2019-10-29 DIAGNOSIS — J9611 Chronic respiratory failure with hypoxia: Secondary | ICD-10-CM | POA: Diagnosis not present

## 2019-11-01 ENCOUNTER — Non-Acute Institutional Stay (SKILLED_NURSING_FACILITY): Payer: Medicare Other | Admitting: Adult Health

## 2019-11-01 ENCOUNTER — Encounter: Payer: Self-pay | Admitting: Adult Health

## 2019-11-01 DIAGNOSIS — I10 Essential (primary) hypertension: Secondary | ICD-10-CM

## 2019-11-01 DIAGNOSIS — J9611 Chronic respiratory failure with hypoxia: Secondary | ICD-10-CM | POA: Diagnosis not present

## 2019-11-01 DIAGNOSIS — R279 Unspecified lack of coordination: Secondary | ICD-10-CM | POA: Diagnosis not present

## 2019-11-01 DIAGNOSIS — F339 Major depressive disorder, recurrent, unspecified: Secondary | ICD-10-CM

## 2019-11-01 DIAGNOSIS — E440 Moderate protein-calorie malnutrition: Secondary | ICD-10-CM

## 2019-11-01 DIAGNOSIS — M6281 Muscle weakness (generalized): Secondary | ICD-10-CM | POA: Diagnosis not present

## 2019-11-01 DIAGNOSIS — M159 Polyosteoarthritis, unspecified: Secondary | ICD-10-CM | POA: Diagnosis not present

## 2019-11-01 NOTE — Progress Notes (Signed)
Location:    [facility] Room Number: 122-W Place of Service:  SNF (31)   CODE STATUS: DNR  Allergies  Allergen Reactions  . Hydrocodone-Acetaminophen Nausea And Vomiting    Chief Complaint  Patient presents with  . Medical Management of Chronic Issues           Essential hypertension:  Protein calorie malnutrition  Major depression chronic    HPI:  She is a 86 year old long term resident of this facility is being seen for the management of her chronic illnesses; Essential hypertension:  Protein calorie malnutrition  Major depression chronic. There are no reports of uncontrolled pain; no reports of anxiety or agitation; no reports of insomnia.   Past Medical History:  Diagnosis Date  . Anxiety   . Dementia (HCC)   . GERD (gastroesophageal reflux disease)   . Goiter   . Hypertension   . Osteoarthritis   . Osteoporosis   . Vitamin D insufficiency     Past Surgical History:  Procedure Laterality Date  . APPENDECTOMY    . KIDNEY STONE SURGERY    . ORIF HIP FRACTURE Right 11/20/2012   Procedure: OPEN REDUCTION INTERNAL FIXATION RIGHT HIP;  Surgeon: Darreld Mclean, MD;  Location: [facility];  Service: Orthopedics;  Laterality: Right;  . stress fractures of legs    . TUBAL LIGATION      Social History   Socioeconomic History  . Marital status: Widowed    Spouse name: Not on file  . Number of children: Not on file  . Years of education: Not on file  . Highest education level: Not on file  Occupational History  . Not on file  Tobacco Use  . Smoking status: Never Smoker  . Smokeless tobacco: Never Used  Vaping Use  . Vaping Use: Never used  Substance and Sexual Activity  . Alcohol use: No  . Drug use: No  . Sexual activity: Never  Other Topics Concern  . Not on file  Social History Narrative  . Not on file   Social Determinants of Health   Financial Resource Strain:   . Difficulty of Paying Living Expenses: Not on file  Food Insecurity:    . Worried About Programme researcher, broadcasting/film/video in the Last Year: Not on file  . Ran Out of Food in the Last Year: Not on file  Transportation Needs:   . Lack of Transportation (Medical): Not on file  . Lack of Transportation (Non-Medical): Not on file  Physical Activity:   . Days of Exercise per Week: Not on file  . Minutes of Exercise per Session: Not on file  Stress:   . Feeling of Stress : Not on file  Social Connections:   . Frequency of Communication with Friends and Family: Not on file  . Frequency of Social Gatherings with Friends and Family: Not on file  . Attends Religious Services: Not on file  . Active Member of Clubs or Organizations: Not on file  . Attends Banker Meetings: Not on file  . Marital Status: Not on file  Intimate Partner Violence:   . Fear of Current or Ex-Partner: Not on file  . Emotionally Abused: Not on file  . Physically Abused: Not on file  . Sexually Abused: Not on file   Family History  Problem Relation Age of Onset  . Heart attack Father   . Heart disease Neg Hx       VITAL SIGNS BP (!) 141/70  Pulse 67   Temp 98.1 F (36.7 C)   Resp 18   Ht 5' (1.524 m)   Wt 115 lb 6.4 oz (52.3 kg)   SpO2 91%   BMI 22.54 kg/m   Outpatient Encounter Medications as of 11/01/2019  Medication Sig  . acetaminophen (TYLENOL) 500 MG tablet Take 1,000 mg by mouth 3 (three) times daily. For pain  . dextromethorphan-guaiFENesin (TUSSIN DM) 10-100 MG/5ML liquid Take 10 mLs by mouth every 6 (six) hours as needed for cough.  . escitalopram (LEXAPRO) 5 MG tablet Take 15 mg by mouth daily.  Marland Kitchen esomeprazole (NEXIUM) 40 MG capsule Take 40 mg by mouth 2 (two) times daily before a meal.   . feeding supplement, ENSURE ENLIVE, (ENSURE ENLIVE) LIQD Take 237 mLs by mouth 3 (three) times daily between meals.  Marland Kitchen loperamide (IMODIUM) 2 MG capsule Take 2 mg by mouth every 4 (four) hours as needed for diarrhea or loose stools.  Marland Kitchen losartan (COZAAR) 100 MG tablet Take 100 mg  by mouth daily.  . magnesium hydroxide (MILK OF MAGNESIA) 400 MG/5ML suspension Take 30 mLs by mouth daily as needed (For Constipation).  . mirtazapine (REMERON) 7.5 MG tablet Take 7.5 mg by mouth at bedtime.  . NON FORMULARY Mechanical soft diet with ground meat  . OXYGEN Inhale 3 L into the lungs continuous.  . [DISCONTINUED] escitalopram (LEXAPRO) 20 MG tablet Take 15 mg by mouth daily.    No facility-administered encounter medications on file as of 11/01/2019.     SIGNIFICANT DIAGNOSTIC EXAMS   PREVIOUS  02-11-19: chest x-ray:  1. Bilateral perihilar and bibasilar streaky densities concerning for developing infiltrate. Follow-up recommended. 2. Probable small bilateral pleural effusions.  02-17-19: ct of chest/angio of chest:  Patchy bilateral ground-glass opacities, most pronounced in the right upper lobe most compatible with pneumonia.     Small bilateral pleural effusions with bilateral lower lobe atelectasis or pneumonia. Cardiomegaly, coronary artery disease. Moderate-sized hiatal hernia. Aortic Atherosclerosis   02-18-19: 2-d echo:   1. Left ventricular ejection fraction, by visual estimation, is 70 to 75%. The left ventricle has hyperdynamic function. There is no left ventricular hypertrophy.  2. Left ventricular diastolic parameters are indeterminate.  3. The left ventricle has no regional wall motion abnormalities.  4. Global right ventricle was not well visualized.The right ventricular size is not well visualized. Right vetricular wall thickness was not assessed.  5. Left atrial size was not well visualized.  6. Right atrial size was not well visualized.  7. The mitral valve was not well visualized. No evidence of mitral valve regurgitation. No evidence of mitral stenosis.  8. The tricuspid valve is not well visualized.  9. The aortic valve was not well visualized. Aortic valve regurgitation is not visualized. Technically difficult assement, grossly there is no  significant aortic stenosis. 10. The pulmonic valve was not well visualized. Pulmonic valve regurgitation is not visualized. 11. The interatrial septum was not well visualized. 12. Technically difficult and limited study  NO NEW EXAMS.    LABS REVIEWED PREVIOUS   02-11-19: wbc 4.2; hgb 12.9; hct 39.8 mcv 96.4 plt 211; glucose 112; bun 25; creat 0.83; k+ 3.1; na++ 134; ca 8.6; liver normal albumin 3.3 blood culture: no growth 02-14-19: glucose 129; bun 16; creat 0.56; k+ 3.8; na++ 140; ca 8.6 hgb a1c 6.3 02-17-19: wbc 7.6; hgb 11.2 hct 35.9; mcv 97.6 plt 256; glucose 114; bun 15; creat 0.48; k+ 4.1; na++ 138; ca 8.7; mag 2.1; tsh 0.369 free t4:  1.18; vit B12: 251 folate 7.3 d-dimer 1.51 02-19-19: wbc 8.6; hgb 11.0; hct 34.0; mcv 94.2 plt 313; glucose 115; bun 16; creat 0.63; k+ 3.6; na++ 134; ca 8.4 mag 1.9  02-26-19: wbc 11.8; hgb 11.9; hct 37.2; mcv 97.6 plt 337; glucose 127; bun 14; creat 0.66; k+ 4.1; na++ 137; ca 8.9;  03-02-19: wbc 4.0; hgb 11.5; hct 36.8; mcv 97.6 plt 286 03-17-19: wbc 9.1; hgb 12.2; hct 39.5; mcv 98.8 plt 343; glucose 156; bun 36; creat 1.06; k+ 3.9; na++ 137; ca 9.4 liver normal albumin 3.2 + guaiac 03-21-19: + guaiac  03-22-19: wbc 5.1; hgb 11.0; hct 36.0; mcv 99.7 plt 265 03-25-19: guaiac neg X 3  04-22-19: tsh 0.317 free T3: 2.9 free T4; 0.98 06-03-19: wbc 6.3; hgb 10.8; hct 34.2; mcv 93.7 plt 185; glucose 117; bun 21; creat 0.67; k+ 3.7; na++ 140; ca 9.0   NO NEW LABS.    Review of Systems  Unable to perform ROS: Dementia (unable to participate )    Physical Exam Constitutional:      General: She is not in acute distress.    Appearance: She is well-developed. She is not diaphoretic.  Neck:     Thyroid: Thyromegaly present.  Cardiovascular:     Rate and Rhythm: Normal rate and regular rhythm.     Pulses: Normal pulses.     Heart sounds: Normal heart sounds.  Pulmonary:     Effort: Pulmonary effort is normal. No respiratory distress.     Breath sounds: Normal  breath sounds.     Comments: 02 Abdominal:     General: Bowel sounds are normal. There is no distension.     Palpations: Abdomen is soft.     Tenderness: There is no abdominal tenderness.  Musculoskeletal:        General: Normal range of motion.     Cervical back: Neck supple.     Right lower leg: No edema.     Left lower leg: No edema.     Comments: Kyphosis   Lymphadenopathy:     Cervical: No cervical adenopathy.  Skin:    General: Skin is warm and dry.  Neurological:     Mental Status: She is alert. Mental status is at baseline.  Psychiatric:        Mood and Affect: Mood normal.      ASSESSMENT/ PLAN:  TODAY  1. Essential hypertension: is stable b/p 141/70 will continue cozaar 100 mg daily   2. Protein calorie malnutrition is stable albumin 3.3 will continue supplements as directed  3. Major depression chronic is stable will continue lexapro 15 mg daily   PREVIOUS   4. GERD without esophagitis is stable has history of GI bleed; will continue nexium 40 mg twice daily  5. Dementia without behavioral disturbance unspecified dementia type: is stable weight is 115 pounds is off aricept; is on remeron 7.5 mg nightly to help maintain appetite.   6. Primary osteoarthritis multiple joints: is stable will continue tylenol 1 gm three times daily   7. Goiter: is stable tsh 0.317 will monitor   Will setup dexa scan.    MD is aware of resident's narcotic use and is in agreement with current plan of care. We will attempt to wean resident as appropriate.  [name] NP [facility]  Contact [phone] Monday through Friday 8am- 5pm  After hours call [phone]

## 2019-11-02 DIAGNOSIS — M6281 Muscle weakness (generalized): Secondary | ICD-10-CM | POA: Diagnosis not present

## 2019-11-02 DIAGNOSIS — R279 Unspecified lack of coordination: Secondary | ICD-10-CM | POA: Diagnosis not present

## 2019-11-02 DIAGNOSIS — M159 Polyosteoarthritis, unspecified: Secondary | ICD-10-CM | POA: Diagnosis not present

## 2019-11-02 DIAGNOSIS — J9611 Chronic respiratory failure with hypoxia: Secondary | ICD-10-CM | POA: Diagnosis not present

## 2019-11-03 DIAGNOSIS — R279 Unspecified lack of coordination: Secondary | ICD-10-CM | POA: Diagnosis not present

## 2019-11-03 DIAGNOSIS — M159 Polyosteoarthritis, unspecified: Secondary | ICD-10-CM | POA: Diagnosis not present

## 2019-11-03 DIAGNOSIS — J9611 Chronic respiratory failure with hypoxia: Secondary | ICD-10-CM | POA: Diagnosis not present

## 2019-11-03 DIAGNOSIS — M6281 Muscle weakness (generalized): Secondary | ICD-10-CM | POA: Diagnosis not present

## 2019-11-04 DIAGNOSIS — J9611 Chronic respiratory failure with hypoxia: Secondary | ICD-10-CM | POA: Diagnosis not present

## 2019-11-04 DIAGNOSIS — M159 Polyosteoarthritis, unspecified: Secondary | ICD-10-CM | POA: Diagnosis not present

## 2019-11-04 DIAGNOSIS — M6281 Muscle weakness (generalized): Secondary | ICD-10-CM | POA: Diagnosis not present

## 2019-11-04 DIAGNOSIS — R279 Unspecified lack of coordination: Secondary | ICD-10-CM | POA: Diagnosis not present

## 2019-11-05 DIAGNOSIS — M159 Polyosteoarthritis, unspecified: Secondary | ICD-10-CM | POA: Diagnosis not present

## 2019-11-05 DIAGNOSIS — R279 Unspecified lack of coordination: Secondary | ICD-10-CM | POA: Diagnosis not present

## 2019-11-05 DIAGNOSIS — J9611 Chronic respiratory failure with hypoxia: Secondary | ICD-10-CM | POA: Diagnosis not present

## 2019-11-05 DIAGNOSIS — M6281 Muscle weakness (generalized): Secondary | ICD-10-CM | POA: Diagnosis not present

## 2019-11-07 DIAGNOSIS — J9611 Chronic respiratory failure with hypoxia: Secondary | ICD-10-CM | POA: Diagnosis not present

## 2019-11-07 DIAGNOSIS — M159 Polyosteoarthritis, unspecified: Secondary | ICD-10-CM | POA: Diagnosis not present

## 2019-11-07 DIAGNOSIS — M6281 Muscle weakness (generalized): Secondary | ICD-10-CM | POA: Diagnosis not present

## 2019-11-07 DIAGNOSIS — R279 Unspecified lack of coordination: Secondary | ICD-10-CM | POA: Diagnosis not present

## 2019-11-08 DIAGNOSIS — M6281 Muscle weakness (generalized): Secondary | ICD-10-CM | POA: Diagnosis not present

## 2019-11-08 DIAGNOSIS — J9611 Chronic respiratory failure with hypoxia: Secondary | ICD-10-CM | POA: Diagnosis not present

## 2019-11-08 DIAGNOSIS — R279 Unspecified lack of coordination: Secondary | ICD-10-CM | POA: Diagnosis not present

## 2019-11-08 DIAGNOSIS — M159 Polyosteoarthritis, unspecified: Secondary | ICD-10-CM | POA: Diagnosis not present

## 2019-11-10 DIAGNOSIS — M159 Polyosteoarthritis, unspecified: Secondary | ICD-10-CM | POA: Diagnosis not present

## 2019-11-10 DIAGNOSIS — R279 Unspecified lack of coordination: Secondary | ICD-10-CM | POA: Diagnosis not present

## 2019-11-10 DIAGNOSIS — J9611 Chronic respiratory failure with hypoxia: Secondary | ICD-10-CM | POA: Diagnosis not present

## 2019-11-10 DIAGNOSIS — M6281 Muscle weakness (generalized): Secondary | ICD-10-CM | POA: Diagnosis not present

## 2019-11-11 DIAGNOSIS — J9611 Chronic respiratory failure with hypoxia: Secondary | ICD-10-CM | POA: Diagnosis not present

## 2019-11-11 DIAGNOSIS — R279 Unspecified lack of coordination: Secondary | ICD-10-CM | POA: Diagnosis not present

## 2019-11-11 DIAGNOSIS — M159 Polyosteoarthritis, unspecified: Secondary | ICD-10-CM | POA: Diagnosis not present

## 2019-11-11 DIAGNOSIS — M6281 Muscle weakness (generalized): Secondary | ICD-10-CM | POA: Diagnosis not present

## 2019-11-13 DIAGNOSIS — M159 Polyosteoarthritis, unspecified: Secondary | ICD-10-CM | POA: Diagnosis not present

## 2019-11-13 DIAGNOSIS — M6281 Muscle weakness (generalized): Secondary | ICD-10-CM | POA: Diagnosis not present

## 2019-11-13 DIAGNOSIS — R279 Unspecified lack of coordination: Secondary | ICD-10-CM | POA: Diagnosis not present

## 2019-11-13 DIAGNOSIS — J9611 Chronic respiratory failure with hypoxia: Secondary | ICD-10-CM | POA: Diagnosis not present

## 2019-11-15 DIAGNOSIS — M159 Polyosteoarthritis, unspecified: Secondary | ICD-10-CM | POA: Diagnosis not present

## 2019-11-15 DIAGNOSIS — R279 Unspecified lack of coordination: Secondary | ICD-10-CM | POA: Diagnosis not present

## 2019-11-15 DIAGNOSIS — J9611 Chronic respiratory failure with hypoxia: Secondary | ICD-10-CM | POA: Diagnosis not present

## 2019-11-15 DIAGNOSIS — M6281 Muscle weakness (generalized): Secondary | ICD-10-CM | POA: Diagnosis not present

## 2019-11-16 DIAGNOSIS — M159 Polyosteoarthritis, unspecified: Secondary | ICD-10-CM | POA: Diagnosis not present

## 2019-11-16 DIAGNOSIS — J9611 Chronic respiratory failure with hypoxia: Secondary | ICD-10-CM | POA: Diagnosis not present

## 2019-11-16 DIAGNOSIS — R279 Unspecified lack of coordination: Secondary | ICD-10-CM | POA: Diagnosis not present

## 2019-11-16 DIAGNOSIS — M6281 Muscle weakness (generalized): Secondary | ICD-10-CM | POA: Diagnosis not present

## 2019-11-17 DIAGNOSIS — M6281 Muscle weakness (generalized): Secondary | ICD-10-CM | POA: Diagnosis not present

## 2019-11-17 DIAGNOSIS — J9611 Chronic respiratory failure with hypoxia: Secondary | ICD-10-CM | POA: Diagnosis not present

## 2019-11-17 DIAGNOSIS — R279 Unspecified lack of coordination: Secondary | ICD-10-CM | POA: Diagnosis not present

## 2019-11-17 DIAGNOSIS — M159 Polyosteoarthritis, unspecified: Secondary | ICD-10-CM | POA: Diagnosis not present

## 2019-11-18 ENCOUNTER — Non-Acute Institutional Stay (SKILLED_NURSING_FACILITY): Payer: Medicare Other | Admitting: Adult Health

## 2019-11-18 ENCOUNTER — Encounter: Payer: Self-pay | Admitting: Adult Health

## 2019-11-18 DIAGNOSIS — J9611 Chronic respiratory failure with hypoxia: Secondary | ICD-10-CM | POA: Diagnosis not present

## 2019-11-18 DIAGNOSIS — M159 Polyosteoarthritis, unspecified: Secondary | ICD-10-CM | POA: Diagnosis not present

## 2019-11-18 DIAGNOSIS — F0390 Unspecified dementia without behavioral disturbance: Secondary | ICD-10-CM | POA: Diagnosis not present

## 2019-11-18 DIAGNOSIS — F339 Major depressive disorder, recurrent, unspecified: Secondary | ICD-10-CM

## 2019-11-18 DIAGNOSIS — E440 Moderate protein-calorie malnutrition: Secondary | ICD-10-CM | POA: Diagnosis not present

## 2019-11-18 DIAGNOSIS — R279 Unspecified lack of coordination: Secondary | ICD-10-CM | POA: Diagnosis not present

## 2019-11-18 DIAGNOSIS — M6281 Muscle weakness (generalized): Secondary | ICD-10-CM | POA: Diagnosis not present

## 2019-11-18 NOTE — Progress Notes (Signed)
Location:    [facility] Room Number: 122/W Place of Service:  SNF (31)   CODE STATUS: DNR  Allergies  Allergen Reactions  . Hydrocodone-Acetaminophen Nausea And Vomiting    Chief Complaint  Patient presents with  . Acute Visit    Care Plan Meeting    HPI:  We have come together for her care plan meeting. BIMS unable mood 4/30. No falls. Requires extensive assist with her adls; is non-ambulatory. Is incontinent of bladder and bowel. She is on remeron; has a poor appetite; however; her weight is 120.8 pounds which is up 6 pounds over the past quarter. There are no reports of uncontrolled pain; no reports of agitation or anxiety. She continues to be followed for her chronic illnesses including:  Dementia without behavioral disturbance unspecified dementia type  Major depression recurrent chronic  Moderate protein calorie malnutrition  Past Medical History:  Diagnosis Date  . Anxiety   . Dementia (HCC)   . GERD (gastroesophageal reflux disease)   . Goiter   . Hypertension   . Osteoarthritis   . Osteoporosis   . Vitamin D insufficiency     Past Surgical History:  Procedure Laterality Date  . APPENDECTOMY    . KIDNEY STONE SURGERY    . ORIF HIP FRACTURE Right 11/20/2012   Procedure: OPEN REDUCTION INTERNAL FIXATION RIGHT HIP;  Surgeon: Darreld Mclean, MD;  Location: [facility];  Service: Orthopedics;  Laterality: Right;  . stress fractures of legs    . TUBAL LIGATION      Social History   Socioeconomic History  . Marital status: Widowed    Spouse name: Not on file  . Number of children: Not on file  . Years of education: Not on file  . Highest education level: Not on file  Occupational History  . Not on file  Tobacco Use  . Smoking status: Never Smoker  . Smokeless tobacco: Never Used  Vaping Use  . Vaping Use: Never used  Substance and Sexual Activity  . Alcohol use: No  . Drug use: No  . Sexual activity: Never  Other Topics Concern  . Not  on file  Social History Narrative  . Not on file   Social Determinants of Health   Financial Resource Strain:   . Difficulty of Paying Living Expenses: Not on file  Food Insecurity:   . Worried About Programme researcher, broadcasting/film/video in the Last Year: Not on file  . Ran Out of Food in the Last Year: Not on file  Transportation Needs:   . Lack of Transportation (Medical): Not on file  . Lack of Transportation (Non-Medical): Not on file  Physical Activity:   . Days of Exercise per Week: Not on file  . Minutes of Exercise per Session: Not on file  Stress:   . Feeling of Stress : Not on file  Social Connections:   . Frequency of Communication with Friends and Family: Not on file  . Frequency of Social Gatherings with Friends and Family: Not on file  . Attends Religious Services: Not on file  . Active Member of Clubs or Organizations: Not on file  . Attends Banker Meetings: Not on file  . Marital Status: Not on file  Intimate Partner Violence:   . Fear of Current or Ex-Partner: Not on file  . Emotionally Abused: Not on file  . Physically Abused: Not on file  . Sexually Abused: Not on file   Family History  Problem Relation  Age of Onset  . Heart attack Father   . Heart disease Neg Hx       VITAL SIGNS BP 128/62   Pulse 64   Temp (!) 97 F (36.1 C)   Resp 18   Ht 5' (1.524 m)   Wt 120 lb 12.8 oz (54.8 kg)   SpO2 93%   BMI 23.59 kg/m   Outpatient Encounter Medications as of 11/18/2019  Medication Sig  . acetaminophen (TYLENOL) 325 MG tablet Take 650 mg by mouth in the morning, at noon, and at bedtime.  Marland Kitchen dextromethorphan-guaiFENesin (TUSSIN DM) 10-100 MG/5ML liquid Take 10 mLs by mouth every 6 (six) hours as needed for cough.  . escitalopram (LEXAPRO) 5 MG tablet Take 15 mg by mouth daily.  Marland Kitchen esomeprazole (NEXIUM) 40 MG capsule Take 40 mg by mouth 2 (two) times daily before a meal.   . feeding supplement (ENSURE ENLIVE / ENSURE PLUS) LIQD Take 237 mLs by mouth 2  (two) times daily between meals.  Marland Kitchen loperamide (IMODIUM) 2 MG capsule Take 2 mg by mouth every 4 (four) hours as needed for diarrhea or loose stools.  Marland Kitchen losartan (COZAAR) 100 MG tablet Take 100 mg by mouth daily.  . magnesium hydroxide (MILK OF MAGNESIA) 400 MG/5ML suspension Take 30 mLs by mouth daily as needed (For Constipation).  . mirtazapine (REMERON) 7.5 MG tablet Take 7.5 mg by mouth at bedtime.  . NON FORMULARY Mechanical soft diet with ground meat  . OXYGEN Inhale 3 L into the lungs continuous.  . [DISCONTINUED] acetaminophen (TYLENOL) 500 MG tablet Take 1,000 mg by mouth 3 (three) times daily. For pain  . [DISCONTINUED] feeding supplement, ENSURE ENLIVE, (ENSURE ENLIVE) LIQD Take 237 mLs by mouth 3 (three) times daily between meals. (Patient taking differently: Take 237 mLs by mouth 2 (two) times daily between meals. )   No facility-administered encounter medications on file as of 11/18/2019.     SIGNIFICANT DIAGNOSTIC EXAMS   PREVIOUS  02-11-19: chest x-ray:  1. Bilateral perihilar and bibasilar streaky densities concerning for developing infiltrate. Follow-up recommended. 2. Probable small bilateral pleural effusions.  02-17-19: ct of chest/angio of chest:  Patchy bilateral ground-glass opacities, most pronounced in the right upper lobe most compatible with pneumonia.     Small bilateral pleural effusions with bilateral lower lobe atelectasis or pneumonia. Cardiomegaly, coronary artery disease. Moderate-sized hiatal hernia. Aortic Atherosclerosis   02-18-19: 2-d echo:   1. Left ventricular ejection fraction, by visual estimation, is 70 to 75%. The left ventricle has hyperdynamic function. There is no left ventricular hypertrophy.  2. Left ventricular diastolic parameters are indeterminate.  3. The left ventricle has no regional wall motion abnormalities.  4. Global right ventricle was not well visualized.The right ventricular size is not well visualized. Right vetricular  wall thickness was not assessed.  5. Left atrial size was not well visualized.  6. Right atrial size was not well visualized.  7. The mitral valve was not well visualized. No evidence of mitral valve regurgitation. No evidence of mitral stenosis.  8. The tricuspid valve is not well visualized.  9. The aortic valve was not well visualized. Aortic valve regurgitation is not visualized. Technically difficult assement, grossly there is no significant aortic stenosis. 10. The pulmonic valve was not well visualized. Pulmonic valve regurgitation is not visualized. 11. The interatrial septum was not well visualized. 12. Technically difficult and limited study  NO NEW EXAMS.    LABS REVIEWED PREVIOUS   02-11-19: wbc 4.2; hgb 12.9;  hct 39.8 mcv 96.4 plt 211; glucose 112; bun 25; creat 0.83; k+ 3.1; na++ 134; ca 8.6; liver normal albumin 3.3 blood culture: no growth 02-14-19: glucose 129; bun 16; creat 0.56; k+ 3.8; na++ 140; ca 8.6 hgb a1c 6.3 02-17-19: wbc 7.6; hgb 11.2 hct 35.9; mcv 97.6 plt 256; glucose 114; bun 15; creat 0.48; k+ 4.1; na++ 138; ca 8.7; mag 2.1; tsh 0.369 free t4: 1.18; vit B12: 251 folate 7.3 d-dimer 1.51 02-19-19: wbc 8.6; hgb 11.0; hct 34.0; mcv 94.2 plt 313; glucose 115; bun 16; creat 0.63; k+ 3.6; na++ 134; ca 8.4 mag 1.9  02-26-19: wbc 11.8; hgb 11.9; hct 37.2; mcv 97.6 plt 337; glucose 127; bun 14; creat 0.66; k+ 4.1; na++ 137; ca 8.9;  03-02-19: wbc 4.0; hgb 11.5; hct 36.8; mcv 97.6 plt 286 03-17-19: wbc 9.1; hgb 12.2; hct 39.5; mcv 98.8 plt 343; glucose 156; bun 36; creat 1.06; k+ 3.9; na++ 137; ca 9.4 liver normal albumin 3.2 + guaiac 03-21-19: + guaiac  03-22-19: wbc 5.1; hgb 11.0; hct 36.0; mcv 99.7 plt 265 03-25-19: guaiac neg X 3  04-22-19: tsh 0.317 free T3: 2.9 free T4; 0.98 06-03-19: wbc 6.3; hgb 10.8; hct 34.2; mcv 93.7 plt 185; glucose 117; bun 21; creat 0.67; k+ 3.7; na++ 140; ca 9.0   NO NEW LABS.   Review of Systems  Unable to perform ROS: Dementia (unable to  participate )    Physical Exam Constitutional:      General: She is not in acute distress.    Appearance: She is well-developed. She is not diaphoretic.  Neck:     Thyroid: Thyromegaly present.  Cardiovascular:     Rate and Rhythm: Normal rate and regular rhythm.     Pulses: Normal pulses.     Heart sounds: Normal heart sounds.  Pulmonary:     Effort: Pulmonary effort is normal. No respiratory distress.     Breath sounds: Normal breath sounds.     Comments: 02 Abdominal:     General: Bowel sounds are normal. There is no distension.     Palpations: Abdomen is soft.     Tenderness: There is no abdominal tenderness.  Musculoskeletal:        General: Normal range of motion.     Right lower leg: No edema.     Left lower leg: No edema.     Comments: Kyphosis   Lymphadenopathy:     Cervical: No cervical adenopathy.  Skin:    General: Skin is warm and dry.  Neurological:     Mental Status: She is alert. Mental status is at baseline.  Psychiatric:        Mood and Affect: Mood normal.      ASSESSMENT/ PLAN:  TODAY  1. Dementia without behavioral disturbance unspecified dementia type 2. Major depression recurrent chronic 3. Moderate protein calorie malnutrition  Will continue current medications Will continue current plan of care 'will continue to monitor her status.   MD is aware of resident's narcotic use and is in agreement with current plan of care. We will attempt to wean resident as appropriate.  [name] NP [facility]  Contact [phone] Monday through Friday 8am- 5pm  After hours call [phone]

## 2019-11-30 ENCOUNTER — Encounter: Payer: Self-pay | Admitting: Adult Health

## 2019-11-30 ENCOUNTER — Non-Acute Institutional Stay (SKILLED_NURSING_FACILITY): Payer: Medicare Other | Admitting: Adult Health

## 2019-11-30 DIAGNOSIS — M159 Polyosteoarthritis, unspecified: Secondary | ICD-10-CM

## 2019-11-30 DIAGNOSIS — K219 Gastro-esophageal reflux disease without esophagitis: Secondary | ICD-10-CM | POA: Diagnosis not present

## 2019-11-30 DIAGNOSIS — F0390 Unspecified dementia without behavioral disturbance: Secondary | ICD-10-CM | POA: Diagnosis not present

## 2019-11-30 DIAGNOSIS — M8949 Other hypertrophic osteoarthropathy, multiple sites: Secondary | ICD-10-CM

## 2019-11-30 NOTE — Progress Notes (Signed)
Location:    [facility] Room Number: 122/W Place of Service:  SNF (31)   CODE STATUS: DNR  Allergies  Allergen Reactions  . Hydrocodone-Acetaminophen Nausea And Vomiting    Chief Complaint  Patient presents with  . Medical Management of Chronic Issues          GERD without esophagitis:  Dementia without behavioral disturbance unspecified dementia type:  Primary osteoarthritis multiple joints    HPI:  She is a 86 year old long term resident of this facility being seen for the management of her chronic illnesses: GERD without esophagitis:  Dementia without behavioral disturbance unspecified dementia type:  Primary osteoarthritis multiple joints. There are no reports of cough; shortness of breath. No reports of uncontrolled pain; no heart burn.   Past Medical History:  Diagnosis Date  . Anxiety   . Dementia (HCC)   . GERD (gastroesophageal reflux disease)   . Goiter   . Hypertension   . Osteoarthritis   . Osteoporosis   . Vitamin D insufficiency     Past Surgical History:  Procedure Laterality Date  . APPENDECTOMY    . KIDNEY STONE SURGERY    . ORIF HIP FRACTURE Right 11/20/2012   Procedure: OPEN REDUCTION INTERNAL FIXATION RIGHT HIP;  Surgeon: Darreld Mclean, MD;  Location: [facility];  Service: Orthopedics;  Laterality: Right;  . stress fractures of legs    . TUBAL LIGATION      Social History   Socioeconomic History  . Marital status: Widowed    Spouse name: Not on file  . Number of children: Not on file  . Years of education: Not on file  . Highest education level: Not on file  Occupational History  . Not on file  Tobacco Use  . Smoking status: Never Smoker  . Smokeless tobacco: Never Used  Vaping Use  . Vaping Use: Never used  Substance and Sexual Activity  . Alcohol use: No  . Drug use: No  . Sexual activity: Never  Other Topics Concern  . Not on file  Social History Narrative  . Not on file   Social Determinants of Health    Financial Resource Strain:   . Difficulty of Paying Living Expenses: Not on file  Food Insecurity:   . Worried About Programme researcher, broadcasting/film/video in the Last Year: Not on file  . Ran Out of Food in the Last Year: Not on file  Transportation Needs:   . Lack of Transportation (Medical): Not on file  . Lack of Transportation (Non-Medical): Not on file  Physical Activity:   . Days of Exercise per Week: Not on file  . Minutes of Exercise per Session: Not on file  Stress:   . Feeling of Stress : Not on file  Social Connections:   . Frequency of Communication with Friends and Family: Not on file  . Frequency of Social Gatherings with Friends and Family: Not on file  . Attends Religious Services: Not on file  . Active Member of Clubs or Organizations: Not on file  . Attends Banker Meetings: Not on file  . Marital Status: Not on file  Intimate Partner Violence:   . Fear of Current or Ex-Partner: Not on file  . Emotionally Abused: Not on file  . Physically Abused: Not on file  . Sexually Abused: Not on file   Family History  Problem Relation Age of Onset  . Heart attack Father   . Heart disease Neg Hx  VITAL SIGNS BP 132/69   Pulse (!) 59   Temp 98 F (36.7 C)   Resp 18   Ht 5' (1.524 m)   Wt 120 lb 12.8 oz (54.8 kg)   SpO2 90%   BMI 23.59 kg/m   Outpatient Encounter Medications as of 11/30/2019  Medication Sig  . acetaminophen (TYLENOL) 325 MG tablet Take 650 mg by mouth in the morning, at noon, and at bedtime.  Marland Kitchen dextromethorphan-guaiFENesin (TUSSIN DM) 10-100 MG/5ML liquid Take 10 mLs by mouth every 6 (six) hours as needed for cough.  . escitalopram (LEXAPRO) 5 MG tablet Take 15 mg by mouth daily.  Marland Kitchen esomeprazole (NEXIUM) 40 MG capsule Take 40 mg by mouth 2 (two) times daily before a meal.   . feeding supplement (ENSURE ENLIVE / ENSURE PLUS) LIQD Take 237 mLs by mouth 2 (two) times daily between meals.  Marland Kitchen loperamide (IMODIUM) 2 MG capsule Take 2 mg by mouth  every 4 (four) hours as needed for diarrhea or loose stools.  Marland Kitchen losartan (COZAAR) 100 MG tablet Take 100 mg by mouth daily.  . magnesium hydroxide (MILK OF MAGNESIA) 400 MG/5ML suspension Take 30 mLs by mouth daily as needed (For Constipation).  . mirtazapine (REMERON) 7.5 MG tablet Take 7.5 mg by mouth at bedtime.  . NON FORMULARY Mechanical soft diet with ground meat  . OXYGEN Inhale 3 L into the lungs continuous.   No facility-administered encounter medications on file as of 11/30/2019.     SIGNIFICANT DIAGNOSTIC EXAMS  PREVIOUS  02-11-19: chest x-ray:  1. Bilateral perihilar and bibasilar streaky densities concerning for developing infiltrate. Follow-up recommended. 2. Probable small bilateral pleural effusions.  02-17-19: ct of chest/angio of chest:  Patchy bilateral ground-glass opacities, most pronounced in the right upper lobe most compatible with pneumonia.     Small bilateral pleural effusions with bilateral lower lobe atelectasis or pneumonia. Cardiomegaly, coronary artery disease. Moderate-sized hiatal hernia. Aortic Atherosclerosis   02-18-19: 2-d echo:   1. Left ventricular ejection fraction, by visual estimation, is 70 to 75%. The left ventricle has hyperdynamic function. There is no left ventricular hypertrophy.  2. Left ventricular diastolic parameters are indeterminate.  3. The left ventricle has no regional wall motion abnormalities.  4. Global right ventricle was not well visualized.The right ventricular size is not well visualized. Right vetricular wall thickness was not assessed.  5. Left atrial size was not well visualized.  6. Right atrial size was not well visualized.  7. The mitral valve was not well visualized. No evidence of mitral valve regurgitation. No evidence of mitral stenosis.  8. The tricuspid valve is not well visualized.  9. The aortic valve was not well visualized. Aortic valve regurgitation is not visualized. Technically difficult assement,  grossly there is no significant aortic stenosis. 10. The pulmonic valve was not well visualized. Pulmonic valve regurgitation is not visualized. 11. The interatrial septum was not well visualized. 12. Technically difficult and limited study  NO NEW EXAMS.    LABS REVIEWED PREVIOUS   02-11-19: wbc 4.2; hgb 12.9; hct 39.8 mcv 96.4 plt 211; glucose 112; bun 25; creat 0.83; k+ 3.1; na++ 134; ca 8.6; liver normal albumin 3.3 blood culture: no growth 02-14-19: glucose 129; bun 16; creat 0.56; k+ 3.8; na++ 140; ca 8.6 hgb a1c 6.3 02-17-19: wbc 7.6; hgb 11.2 hct 35.9; mcv 97.6 plt 256; glucose 114; bun 15; creat 0.48; k+ 4.1; na++ 138; ca 8.7; mag 2.1; tsh 0.369 free t4: 1.18; vit B12: 251 folate 7.3  d-dimer 1.51 02-19-19: wbc 8.6; hgb 11.0; hct 34.0; mcv 94.2 plt 313; glucose 115; bun 16; creat 0.63; k+ 3.6; na++ 134; ca 8.4 mag 1.9  02-26-19: wbc 11.8; hgb 11.9; hct 37.2; mcv 97.6 plt 337; glucose 127; bun 14; creat 0.66; k+ 4.1; na++ 137; ca 8.9;  03-02-19: wbc 4.0; hgb 11.5; hct 36.8; mcv 97.6 plt 286 03-17-19: wbc 9.1; hgb 12.2; hct 39.5; mcv 98.8 plt 343; glucose 156; bun 36; creat 1.06; k+ 3.9; na++ 137; ca 9.4 liver normal albumin 3.2 + guaiac 03-21-19: + guaiac  03-22-19: wbc 5.1; hgb 11.0; hct 36.0; mcv 99.7 plt 265 03-25-19: guaiac neg X 3  04-22-19: tsh 0.317 free T3: 2.9 free T4; 0.98 06-03-19: wbc 6.3; hgb 10.8; hct 34.2; mcv 93.7 plt 185; glucose 117; bun 21; creat 0.67; k+ 3.7; na++ 140; ca 9.0   NO NEW LABS.    Review of Systems  Unable to perform ROS: Dementia (unable to participate )    Physical Exam Constitutional:      General: She is not in acute distress.    Appearance: She is well-developed. She is not diaphoretic.  Neck:     Thyroid: Thyromegaly present.  Cardiovascular:     Rate and Rhythm: Normal rate and regular rhythm.     Heart sounds: Normal heart sounds.  Pulmonary:     Effort: Pulmonary effort is normal. No respiratory distress.     Breath sounds: Normal breath  sounds.     Comments: 02 Abdominal:     General: Bowel sounds are normal. There is no distension.     Palpations: Abdomen is soft.     Tenderness: There is no abdominal tenderness.  Musculoskeletal:        General: Normal range of motion.     Cervical back: Neck supple.     Right lower leg: No edema.     Left lower leg: No edema.     Comments: Kyphosis   Lymphadenopathy:     Cervical: No cervical adenopathy.  Skin:    General: Skin is warm and dry.  Neurological:     Mental Status: She is alert. Mental status is at baseline.  Psychiatric:        Mood and Affect: Mood normal.     ASSESSMENT/ PLAN:  TODAY  1. GERD without esophagitis: is stable has history of GI bleed; will continue nexium 40 mg twice daily   2. Dementia without behavioral disturbance unspecified dementia type: is stable weight is 120 pounds; is off aricept remains on remeron 7.5 mg nightly to maintain appetite.   3. Primary osteoarthritis multiple joints is stable will continue tylenol 1 gm three times daily   PREVIOUS   4. Goiter: is stable tsh 0.317 will monitor   5. Essential hypertension: is stable b/p 132/69 will continue cozaar 100 mg daily   6. Protein calorie malnutrition is stable albumin 3.3 will continue supplements as directed  7. Major depression chronic is stable will continue lexapro 15 mg daily    Will get cbc; cmp tsh free t3 free t4     MD is aware of resident's narcotic use and is in agreement with current plan of care. We will attempt to wean resident as appropriate.  [name] NP [facility]  Contact [phone] Monday through Friday 8am- 5pm  After hours call [phone]

## 2019-12-06 ENCOUNTER — Encounter: Payer: Self-pay | Admitting: Adult Health

## 2019-12-06 ENCOUNTER — Other Ambulatory Visit (HOSPITAL_COMMUNITY)
Admission: RE | Admit: 2019-12-06 | Discharge: 2019-12-06 | Disposition: A | Discharge status: Home / Self Care | Payer: Medicare Other | Source: Skilled Nursing Facility | Attending: Adult Health | Admitting: Adult Health

## 2019-12-06 DIAGNOSIS — I10 Essential (primary) hypertension: Secondary | ICD-10-CM | POA: Diagnosis not present

## 2019-12-06 LAB — COMPREHENSIVE METABOLIC PANEL
ALT: 11 U/L (ref 0–44)
AST: 17 U/L (ref 15–41)
Albumin: 3 g/dL — ABNORMAL LOW (ref 3.5–5.0)
Alkaline Phosphatase: 39 U/L (ref 38–126)
Anion gap: 6 (ref 5–15)
BUN: 17 mg/dL (ref 8–23)
CO2: 29 mmol/L (ref 22–32)
Calcium: 8.7 mg/dL — ABNORMAL LOW (ref 8.9–10.3)
Chloride: 105 mmol/L (ref 98–111)
Creatinine, Ser: 0.65 mg/dL (ref 0.44–1.00)
GFR, Estimated: >60 mL/min (ref >60)
Glucose, Bld: 81 mg/dL (ref 70–99)
Potassium: 3.8 mmol/L (ref 3.5–5.1)
Sodium: 140 mmol/L (ref 135–145)
Total Bilirubin: 0.5 mg/dL (ref 0.3–1.2)
Total Protein: 6 g/dL — ABNORMAL LOW (ref 6.5–8.1)

## 2019-12-06 LAB — CBC
HCT: 34.2 % — ABNORMAL LOW (ref 36.0–46.0)
Hemoglobin: 10.7 g/dL — ABNORMAL LOW (ref 12.0–15.0)
MCH: 30.3 pg (ref 26.0–34.0)
MCHC: 31.3 g/dL (ref 30.0–36.0)
MCV: 96.9 fL (ref 80.0–100.0)
Platelets: 199 10*3/uL (ref 150–400)
RBC: 3.53 MIL/uL — ABNORMAL LOW (ref 3.87–5.11)
RDW: 14 % (ref 11.5–15.5)
WBC: 5.6 10*3/uL (ref 4.0–10.5)
nRBC: 0 % (ref 0.0–0.2)

## 2019-12-06 LAB — TSH: TSH: 0.491 u[IU]/mL (ref 0.350–4.500)

## 2019-12-06 LAB — T4, FREE: Free T4: 0.74 ng/dL (ref 0.61–1.12)

## 2019-12-06 NOTE — Progress Notes (Signed)
Location:    [facility] Room Number: 122/W Place of Service:  SNF (31)   CODE STATUS: DNR  Allergies  Allergen Reactions  . Hydrocodone-Acetaminophen Nausea And Vomiting    Chief Complaint  Patient presents with  . Acute Visit    TSH    HPI:    Past Medical History:  Diagnosis Date  . Anxiety   . Dementia (HCC)   . GERD (gastroesophageal reflux disease)   . Goiter   . Hypertension   . Osteoarthritis   . Osteoporosis   . Vitamin D insufficiency     Past Surgical History:  Procedure Laterality Date  . APPENDECTOMY    . KIDNEY STONE SURGERY    . ORIF HIP FRACTURE Right 11/20/2012   Procedure: OPEN REDUCTION INTERNAL FIXATION RIGHT HIP;  Surgeon: Darreld Mclean, MD;  Location: [facility];  Service: Orthopedics;  Laterality: Right;  . stress fractures of legs    . TUBAL LIGATION      Social History   Socioeconomic History  . Marital status: Widowed    Spouse name: Not on file  . Number of children: Not on file  . Years of education: Not on file  . Highest education level: Not on file  Occupational History  . Not on file  Tobacco Use  . Smoking status: Never Smoker  . Smokeless tobacco: Never Used  Vaping Use  . Vaping Use: Never used  Substance and Sexual Activity  . Alcohol use: No  . Drug use: No  . Sexual activity: Never  Other Topics Concern  . Not on file  Social History Narrative  . Not on file   Social Determinants of Health   Financial Resource Strain:   . Difficulty of Paying Living Expenses: Not on file  Food Insecurity:   . Worried About Programme researcher, broadcasting/film/video in the Last Year: Not on file  . Ran Out of Food in the Last Year: Not on file  Transportation Needs:   . Lack of Transportation (Medical): Not on file  . Lack of Transportation (Non-Medical): Not on file  Physical Activity:   . Days of Exercise per Week: Not on file  . Minutes of Exercise per Session: Not on file  Stress:   . Feeling of Stress : Not on file   Social Connections:   . Frequency of Communication with Friends and Family: Not on file  . Frequency of Social Gatherings with Friends and Family: Not on file  . Attends Religious Services: Not on file  . Active Member of Clubs or Organizations: Not on file  . Attends Banker Meetings: Not on file  . Marital Status: Not on file  Intimate Partner Violence:   . Fear of Current or Ex-Partner: Not on file  . Emotionally Abused: Not on file  . Physically Abused: Not on file  . Sexually Abused: Not on file   Family History  Problem Relation Age of Onset  . Heart attack Father   . Heart disease Neg Hx       VITAL SIGNS BP (!) 146/68   Pulse (!) 56   Temp 97.6 F (36.4 C)   Resp 18   Ht 5' (1.524 m)   Wt 120 lb 12.8 oz (54.8 kg)   SpO2 98%   BMI 23.59 kg/m   Outpatient Encounter Medications as of 12/06/2019  Medication Sig  . acetaminophen (TYLENOL) 325 MG tablet Take 650 mg by mouth in the morning, at noon,  and at bedtime.  Marland Kitchen dextromethorphan-guaiFENesin (TUSSIN DM) 10-100 MG/5ML liquid Take 10 mLs by mouth every 6 (six) hours as needed for cough.  . escitalopram (LEXAPRO) 5 MG tablet Take 15 mg by mouth daily.  Marland Kitchen esomeprazole (NEXIUM) 40 MG capsule Take 40 mg by mouth 2 (two) times daily before a meal.   . feeding supplement (ENSURE ENLIVE / ENSURE PLUS) LIQD Take 237 mLs by mouth 2 (two) times daily between meals.  Marland Kitchen loperamide (IMODIUM) 2 MG capsule Take 2 mg by mouth every 4 (four) hours as needed for diarrhea or loose stools.  Marland Kitchen losartan (COZAAR) 100 MG tablet Take 100 mg by mouth daily.  . magnesium hydroxide (MILK OF MAGNESIA) 400 MG/5ML suspension Take 30 mLs by mouth daily as needed (For Constipation).  . mirtazapine (REMERON) 7.5 MG tablet Take 7.5 mg by mouth at bedtime.  . NON FORMULARY Mechanical soft diet with ground meat  . OXYGEN Inhale 3 L into the lungs continuous.   No facility-administered encounter medications on file as of 12/06/2019.      SIGNIFICANT DIAGNOSTIC EXAMS       ASSESSMENT/ PLAN:    MD is aware of resident's narcotic use and is in agreement with current plan of care. We will attempt to wean resident as appropriate.  [name] NP [facility]  Contact [phone] Monday through Friday 8am- 5pm  After hours call [phone]

## 2019-12-07 LAB — T3, FREE: T3, Free: 2.5 pg/mL (ref 2.0–4.4)

## 2019-12-09 DIAGNOSIS — Z23 Encounter for immunization: Secondary | ICD-10-CM | POA: Diagnosis not present

## 2019-12-10 NOTE — Progress Notes (Signed)
This encounter was created in error - please disregard.

## 2019-12-16 ENCOUNTER — Non-Acute Institutional Stay (SKILLED_NURSING_FACILITY): Payer: Medicare Other | Admitting: Adult Health

## 2019-12-16 DIAGNOSIS — R937 Abnormal findings on diagnostic imaging of other parts of musculoskeletal system: Secondary | ICD-10-CM | POA: Diagnosis not present

## 2019-12-16 DIAGNOSIS — M810 Age-related osteoporosis without current pathological fracture: Secondary | ICD-10-CM | POA: Diagnosis not present

## 2019-12-17 ENCOUNTER — Encounter: Payer: Self-pay | Admitting: Adult Health

## 2019-12-17 DIAGNOSIS — L603 Nail dystrophy: Secondary | ICD-10-CM | POA: Diagnosis not present

## 2019-12-17 DIAGNOSIS — I739 Peripheral vascular disease, unspecified: Secondary | ICD-10-CM | POA: Diagnosis not present

## 2019-12-17 DIAGNOSIS — M79675 Pain in left toe(s): Secondary | ICD-10-CM | POA: Diagnosis not present

## 2019-12-17 DIAGNOSIS — B351 Tinea unguium: Secondary | ICD-10-CM | POA: Diagnosis not present

## 2019-12-17 DIAGNOSIS — M79674 Pain in right toe(s): Secondary | ICD-10-CM | POA: Diagnosis not present

## 2019-12-17 DIAGNOSIS — M810 Age-related osteoporosis without current pathological fracture: Secondary | ICD-10-CM | POA: Insufficient documentation

## 2019-12-17 NOTE — Progress Notes (Signed)
Location:   [facility] Room Number: 222 Place of Service:  SNF (31)   CODE STATUS: dnr  Allergies  Allergen Reactions  . Hydrocodone-Acetaminophen Nausea And Vomiting    Chief Complaint  Patient presents with  . Acute Visit    osteoporosis     HPI:  She has a t score of -5.678. she has a history of right hip fracture in 2014. There are no reports of recent falls; no reports of recent injuries. She is at significant risk for future fractures. There are no reports of uncontrolled pain present. She is wheelchair bound.   Past Medical History:  Diagnosis Date  . Anxiety   . Dementia (HCC)   . GERD (gastroesophageal reflux disease)   . Goiter   . Hypertension   . Osteoarthritis   . Osteoporosis   . Vitamin D insufficiency     Past Surgical History:  Procedure Laterality Date  . APPENDECTOMY    . KIDNEY STONE SURGERY    . ORIF HIP FRACTURE Right 11/20/2012   Procedure: OPEN REDUCTION INTERNAL FIXATION RIGHT HIP;  Surgeon: Darreld Mclean, MD;  Location: [facility];  Service: Orthopedics;  Laterality: Right;  . stress fractures of legs    . TUBAL LIGATION      Social History   Socioeconomic History  . Marital status: Widowed    Spouse name: Not on file  . Number of children: Not on file  . Years of education: Not on file  . Highest education level: Not on file  Occupational History  . Not on file  Tobacco Use  . Smoking status: Never Smoker  . Smokeless tobacco: Never Used  Vaping Use  . Vaping Use: Never used  Substance and Sexual Activity  . Alcohol use: No  . Drug use: No  . Sexual activity: Never  Other Topics Concern  . Not on file  Social History Narrative  . Not on file   Social Determinants of Health   Financial Resource Strain:   . Difficulty of Paying Living Expenses: Not on file  Food Insecurity:   . Worried About Programme researcher, broadcasting/film/video in the Last Year: Not on file  . Ran Out of Food in the Last Year: Not on file    Transportation Needs:   . Lack of Transportation (Medical): Not on file  . Lack of Transportation (Non-Medical): Not on file  Physical Activity:   . Days of Exercise per Week: Not on file  . Minutes of Exercise per Session: Not on file  Stress:   . Feeling of Stress : Not on file  Social Connections:   . Frequency of Communication with Friends and Family: Not on file  . Frequency of Social Gatherings with Friends and Family: Not on file  . Attends Religious Services: Not on file  . Active Member of Clubs or Organizations: Not on file  . Attends Banker Meetings: Not on file  . Marital Status: Not on file  Intimate Partner Violence:   . Fear of Current or Ex-Partner: Not on file  . Emotionally Abused: Not on file  . Physically Abused: Not on file  . Sexually Abused: Not on file   Family History  Problem Relation Age of Onset  . Heart attack Father   . Heart disease Neg Hx       VITAL SIGNS BP 134/71   Pulse 74   Temp (!) 97.3 F (36.3 C)   Ht 5' (1.524 m)  Wt 121 lb (54.9 kg)   BMI 23.63 kg/m   Outpatient Encounter Medications as of 12/16/2019  Medication Sig  . acetaminophen (TYLENOL) 325 MG tablet Take 650 mg by mouth in the morning, at noon, and at bedtime.  Marland Kitchen dextromethorphan-guaiFENesin (TUSSIN DM) 10-100 MG/5ML liquid Take 10 mLs by mouth every 6 (six) hours as needed for cough.  . escitalopram (LEXAPRO) 5 MG tablet Take 15 mg by mouth daily.  Marland Kitchen esomeprazole (NEXIUM) 40 MG capsule Take 40 mg by mouth 2 (two) times daily before a meal.   . feeding supplement (ENSURE ENLIVE / ENSURE PLUS) LIQD Take 237 mLs by mouth 2 (two) times daily between meals.  Marland Kitchen loperamide (IMODIUM) 2 MG capsule Take 2 mg by mouth every 4 (four) hours as needed for diarrhea or loose stools.  Marland Kitchen losartan (COZAAR) 100 MG tablet Take 100 mg by mouth daily.  . magnesium hydroxide (MILK OF MAGNESIA) 400 MG/5ML suspension Take 30 mLs by mouth daily as needed (For Constipation).  .  mirtazapine (REMERON) 7.5 MG tablet Take 7.5 mg by mouth at bedtime.  . NON FORMULARY Mechanical soft diet with ground meat  . OXYGEN Inhale 3 L into the lungs continuous.   No facility-administered encounter medications on file as of 12/16/2019.     SIGNIFICANT DIAGNOSTIC EXAMS    PREVIOUS  02-11-19: chest x-ray:  1. Bilateral perihilar and bibasilar streaky densities concerning for developing infiltrate. Follow-up recommended. 2. Probable small bilateral pleural effusions.  02-17-19: ct of chest/angio of chest:  Patchy bilateral ground-glass opacities, most pronounced in the right upper lobe most compatible with pneumonia.     Small bilateral pleural effusions with bilateral lower lobe atelectasis or pneumonia. Cardiomegaly, coronary artery disease. Moderate-sized hiatal hernia. Aortic Atherosclerosis   02-18-19: 2-d echo:   1. Left ventricular ejection fraction, by visual estimation, is 70 to 75%. The left ventricle has hyperdynamic function. There is no left ventricular hypertrophy.  2. Left ventricular diastolic parameters are indeterminate.  3. The left ventricle has no regional wall motion abnormalities.  4. Global right ventricle was not well visualized.The right ventricular size is not well visualized. Right vetricular wall thickness was not assessed.  5. Left atrial size was not well visualized.  6. Right atrial size was not well visualized.  7. The mitral valve was not well visualized. No evidence of mitral valve regurgitation. No evidence of mitral stenosis.  8. The tricuspid valve is not well visualized.  9. The aortic valve was not well visualized. Aortic valve regurgitation is not visualized. Technically difficult assement, grossly there is no significant aortic stenosis. 10. The pulmonic valve was not well visualized. Pulmonic valve regurgitation is not visualized. 11. The interatrial septum was not well visualized. 12. Technically difficult and limited study  TODAY    12-16-19: DEXA: t score -5.678    LABS REVIEWED PREVIOUS   02-11-19: wbc 4.2; hgb 12.9; hct 39.8 mcv 96.4 plt 211; glucose 112; bun 25; creat 0.83; k+ 3.1; na++ 134; ca 8.6; liver normal albumin 3.3 blood culture: no growth 02-14-19: glucose 129; bun 16; creat 0.56; k+ 3.8; na++ 140; ca 8.6 hgb a1c 6.3 02-17-19: wbc 7.6; hgb 11.2 hct 35.9; mcv 97.6 plt 256; glucose 114; bun 15; creat 0.48; k+ 4.1; na++ 138; ca 8.7; mag 2.1; tsh 0.369 free t4: 1.18; vit B12: 251 folate 7.3 d-dimer 1.51 02-19-19: wbc 8.6; hgb 11.0; hct 34.0; mcv 94.2 plt 313; glucose 115; bun 16; creat 0.63; k+ 3.6; na++ 134; ca 8.4 mag 1.9  02-26-19: wbc 11.8; hgb 11.9; hct 37.2; mcv 97.6 plt 337; glucose 127; bun 14; creat 0.66; k+ 4.1; na++ 137; ca 8.9;  03-02-19: wbc 4.0; hgb 11.5; hct 36.8; mcv 97.6 plt 286 03-17-19: wbc 9.1; hgb 12.2; hct 39.5; mcv 98.8 plt 343; glucose 156; bun 36; creat 1.06; k+ 3.9; na++ 137; ca 9.4 liver normal albumin 3.2 + guaiac 03-21-19: + guaiac  03-22-19: wbc 5.1; hgb 11.0; hct 36.0; mcv 99.7 plt 265 03-25-19: guaiac neg X 3  04-22-19: tsh 0.317 free T3: 2.9 free T4; 0.98 06-03-19: wbc 6.3; hgb 10.8; hct 34.2; mcv 93.7 plt 185; glucose 117; bun 21; creat 0.67; k+ 3.7; na++ 140; ca 9.0   TODAY  12-06-19; wbc 5.6; hgb 10.7; hct 34.2; mcv 996.9 plt 199; glucose 81; bun 17; creat 0.65; k+ 3.8; na++ 140; ca 8.7; liver normal albumin 3.0 tsh 0.491 free t3: 2.5 free t4: 0.74   Review of Systems  Unable to perform ROS: Dementia (unable to participate )    Physical Exam Constitutional:      General: She is not in acute distress.    Appearance: She is well-developed. She is not diaphoretic.  Neck:     Thyroid: No thyromegaly.  Cardiovascular:     Rate and Rhythm: Normal rate and regular rhythm.     Pulses: Normal pulses.     Heart sounds: Normal heart sounds.  Pulmonary:     Effort: Pulmonary effort is normal. No respiratory distress.     Breath sounds: Normal breath sounds.     Comments:  02 Abdominal:     General: Bowel sounds are normal. There is no distension.     Palpations: Abdomen is soft.     Tenderness: There is no abdominal tenderness.  Musculoskeletal:        General: Normal range of motion.     Cervical back: Neck supple.     Right lower leg: No edema.     Left lower leg: No edema.     Comments: Kyphosis   Lymphadenopathy:     Cervical: No cervical adenopathy.  Skin:    General: Skin is warm and dry.  Neurological:     Mental Status: She is alert. Mental status is at baseline.  Psychiatric:        Mood and Affect: Mood normal.       ASSESSMENT/ PLAN:  TODAY  1. Post menopausal osteoporosis: is worse: t score -5.678.  Will begin the following:  tums ex three times daily for ca++ supplement Vit D 2,000 units daily for supplement Fosamax 70 mg weekly  Will monitor her status.   MD is aware of resident's narcotic use and is in agreement with current plan of care. We will attempt to wean resident as appropriate.  [name] NP [facility]  Contact [phone] Monday through Friday 8am- 5pm  After hours call [phone]

## 2019-12-31 ENCOUNTER — Non-Acute Institutional Stay (SKILLED_NURSING_FACILITY): Payer: Medicare Other | Admitting: Adult Health

## 2019-12-31 ENCOUNTER — Encounter: Payer: Self-pay | Admitting: Adult Health

## 2019-12-31 DIAGNOSIS — E440 Moderate protein-calorie malnutrition: Secondary | ICD-10-CM | POA: Diagnosis not present

## 2019-12-31 DIAGNOSIS — E049 Nontoxic goiter, unspecified: Secondary | ICD-10-CM | POA: Diagnosis not present

## 2019-12-31 DIAGNOSIS — I10 Essential (primary) hypertension: Secondary | ICD-10-CM

## 2019-12-31 DIAGNOSIS — I700 Atherosclerosis of aorta: Secondary | ICD-10-CM

## 2019-12-31 NOTE — Progress Notes (Signed)
Location:   [facility] Home Room Number: 222 Place of Service:  SNF (31)   CODE STATUS: dnr  Allergies  Allergen Reactions   Hydrocodone-Acetaminophen Nausea And Vomiting    Chief Complaint  Patient presents with   Medical Management of Chronic Issues           Goiter:  Essential hypertension:  Protein calorie malnutrition:   Aortic atherosclerosis    HPI:  She is a 86 year old long term resident of this facility being seen for the management of her chronic illnesses: Goiter:  Essential hypertension:  Protein calorie malnutrition:   Aortic atherosclerosis. There are no reports of cough; no shortness of breath no uncontrolled pain.   Past Medical History:  Diagnosis Date   Anxiety    Dementia (HCC)    GERD (gastroesophageal reflux disease)    Goiter    Hypertension    Osteoarthritis    Osteoporosis    Vitamin D insufficiency     Past Surgical History:  Procedure Laterality Date   APPENDECTOMY     KIDNEY STONE SURGERY     ORIF HIP FRACTURE Right 11/20/2012   Procedure: OPEN REDUCTION INTERNAL FIXATION RIGHT HIP;  Surgeon: Darreld McleanWayne Keeling, MD;  Location: [facility];  Service: Orthopedics;  Laterality: Right;   stress fractures of legs     TUBAL LIGATION      Social History   Socioeconomic History   Marital status: Widowed    Spouse name: Not on file   Number of children: Not on file   Years of education: Not on file   Highest education level: Not on file  Occupational History   Not on file  Tobacco Use   Smoking status: Never Smoker   Smokeless tobacco: Never Used  Vaping Use   Vaping Use: Never used  Substance and Sexual Activity   Alcohol use: No   Drug use: No   Sexual activity: Never  Other Topics Concern   Not on file  Social History Narrative   Not on file   Social Determinants of Health   Financial Resource Strain:    Difficulty of Paying Living Expenses: Not on file  Food Insecurity:    Worried  About Running Out of Food in the Last Year: Not on file   Ran Out of Food in the Last Year: Not on file  Transportation Needs:    Lack of Transportation (Medical): Not on file   Lack of Transportation (Non-Medical): Not on file  Physical Activity:    Days of Exercise per Week: Not on file   Minutes of Exercise per Session: Not on file  Stress:    Feeling of Stress : Not on file  Social Connections:    Frequency of Communication with Friends and Family: Not on file   Frequency of Social Gatherings with Friends and Family: Not on file   Attends Religious Services: Not on file   Active Member of Clubs or Organizations: Not on file   Attends BankerClub or Organization Meetings: Not on file   Marital Status: Not on file  Intimate Partner Violence:    Fear of Current or Ex-Partner: Not on file   Emotionally Abused: Not on file   Physically Abused: Not on file   Sexually Abused: Not on file   Family History  Problem Relation Age of Onset   Heart attack Father    Heart disease Neg Hx       VITAL SIGNS BP 139/84  Pulse 80    Temp (!) 97.1 F (36.2 C)    Resp 16    Ht 5' (1.524 m)    Wt 121 lb (54.9 kg)    BMI 23.63 kg/m   Outpatient Encounter Medications as of 12/31/2019  Medication Sig   alendronate (FOSAMAX) 70 MG tablet Take 70 mg by mouth once a week. Take with a full glass of water on an empty stomach.   calcium carbonate (TUMS EX) 750 MG chewable tablet Chew 1 tablet by mouth 3 (three) times daily.   Vitamin D, Ergocalciferol, (DRISDOL) 1.25 MG (50000 UNIT) CAPS capsule Take 50,000 Units by mouth every 7 (seven) days.   acetaminophen (TYLENOL) 325 MG tablet Take 650 mg by mouth in the morning, at noon, and at bedtime.   dextromethorphan-guaiFENesin (TUSSIN DM) 10-100 MG/5ML liquid Take 10 mLs by mouth every 6 (six) hours as needed for cough.   escitalopram (LEXAPRO) 5 MG tablet Take 15 mg by mouth daily.   esomeprazole (NEXIUM) 40 MG capsule Take 40 mg by  mouth 2 (two) times daily before a meal.    feeding supplement (ENSURE ENLIVE / ENSURE PLUS) LIQD Take 237 mLs by mouth 2 (two) times daily between meals.   loperamide (IMODIUM) 2 MG capsule Take 2 mg by mouth every 4 (four) hours as needed for diarrhea or loose stools.   losartan (COZAAR) 100 MG tablet Take 100 mg by mouth daily.   magnesium hydroxide (MILK OF MAGNESIA) 400 MG/5ML suspension Take 30 mLs by mouth daily as needed (For Constipation).   mirtazapine (REMERON) 7.5 MG tablet Take 7.5 mg by mouth at bedtime.   NON FORMULARY Mechanical soft diet with ground meat   OXYGEN Inhale 3 L into the lungs continuous.   No facility-administered encounter medications on file as of 12/31/2019.     SIGNIFICANT DIAGNOSTIC EXAMS  PREVIOUS  02-11-19: chest x-ray:  1. Bilateral perihilar and bibasilar streaky densities concerning for developing infiltrate. Follow-up recommended. 2. Probable small bilateral pleural effusions.  02-17-19: ct of chest/angio of chest:  Patchy bilateral ground-glass opacities, most pronounced in the right upper lobe most compatible with pneumonia.     Small bilateral pleural effusions with bilateral lower lobe atelectasis or pneumonia. Cardiomegaly, coronary artery disease. Moderate-sized hiatal hernia. Aortic Atherosclerosis   02-18-19: 2-d echo:   1. Left ventricular ejection fraction, by visual estimation, is 70 to 75%. The left ventricle has hyperdynamic function. There is no left ventricular hypertrophy.  2. Left ventricular diastolic parameters are indeterminate.  3. The left ventricle has no regional wall motion abnormalities.  4. Global right ventricle was not well visualized.The right ventricular size is not well visualized. Right vetricular wall thickness was not assessed.  5. Left atrial size was not well visualized.  6. Right atrial size was not well visualized.  7. The mitral valve was not well visualized. No evidence of mitral valve  regurgitation. No evidence of mitral stenosis.  8. The tricuspid valve is not well visualized.  9. The aortic valve was not well visualized. Aortic valve regurgitation is not visualized. Technically difficult assement, grossly there is no significant aortic stenosis. 10. The pulmonic valve was not well visualized. Pulmonic valve regurgitation is not visualized. 11. The interatrial septum was not well visualized. 12. Technically difficult and limited study  12-16-19: DEXA: t score -5.678   NO NEW EXAMS.    LABS REVIEWED PREVIOUS   02-11-19: wbc 4.2; hgb 12.9; hct 39.8 mcv 96.4 plt 211; glucose 112; bun 25; creat  0.83; k+ 3.1; na++ 134; ca 8.6; liver normal albumin 3.3 blood culture: no growth 02-14-19: glucose 129; bun 16; creat 0.56; k+ 3.8; na++ 140; ca 8.6 hgb a1c 6.3 02-17-19: wbc 7.6; hgb 11.2 hct 35.9; mcv 97.6 plt 256; glucose 114; bun 15; creat 0.48; k+ 4.1; na++ 138; ca 8.7; mag 2.1; tsh 0.369 free t4: 1.18; vit B12: 251 folate 7.3 d-dimer 1.51 02-19-19: wbc 8.6; hgb 11.0; hct 34.0; mcv 94.2 plt 313; glucose 115; bun 16; creat 0.63; k+ 3.6; na++ 134; ca 8.4 mag 1.9  02-26-19: wbc 11.8; hgb 11.9; hct 37.2; mcv 97.6 plt 337; glucose 127; bun 14; creat 0.66; k+ 4.1; na++ 137; ca 8.9;  03-02-19: wbc 4.0; hgb 11.5; hct 36.8; mcv 97.6 plt 286 03-17-19: wbc 9.1; hgb 12.2; hct 39.5; mcv 98.8 plt 343; glucose 156; bun 36; creat 1.06; k+ 3.9; na++ 137; ca 9.4 liver normal albumin 3.2 + guaiac 03-21-19: + guaiac  03-22-19: wbc 5.1; hgb 11.0; hct 36.0; mcv 99.7 plt 265 03-25-19: guaiac neg X 3  04-22-19: tsh 0.317 free T3: 2.9 free T4; 0.98 06-03-19: wbc 6.3; hgb 10.8; hct 34.2; mcv 93.7 plt 185; glucose 117; bun 21; creat 0.67; k+ 3.7; na++ 140; ca 9.0  12-06-19; wbc 5.6; hgb 10.7; hct 34.2; mcv 996.9 plt 199; glucose 81; bun 17; creat 0.65; k+ 3.8; na++ 140; ca 8.7; liver normal albumin 3.0 tsh 0.491 free t3: 2.5 free t4: 0.74  NO NEW LABS.   Review of Systems  Unable to perform ROS: Dementia (unable  to participate )    Physical Exam Constitutional:      General: She is not in acute distress.    Appearance: She is well-developed. She is not diaphoretic.     Comments: thin  Neck:     Thyroid: No thyromegaly.  Cardiovascular:     Rate and Rhythm: Normal rate and regular rhythm.     Pulses: Normal pulses.     Heart sounds: Normal heart sounds.  Pulmonary:     Effort: Pulmonary effort is normal. No respiratory distress.     Breath sounds: Normal breath sounds.     Comments: 02 Abdominal:     General: Bowel sounds are normal. There is no distension.     Palpations: Abdomen is soft.     Tenderness: There is no abdominal tenderness.  Musculoskeletal:        General: Normal range of motion.     Cervical back: Neck supple.     Right lower leg: No edema.     Left lower leg: No edema.     Comments: Kyphosis   Lymphadenopathy:     Cervical: No cervical adenopathy.  Skin:    General: Skin is warm and dry.  Neurological:     Mental Status: She is alert. Mental status is at baseline.  Psychiatric:        Mood and Affect: Mood normal.     ASSESSMENT/ PLAN:  TODAY  1. Goiter: is stable tsh 0.941 will monitor   2. Essential hypertension: is stable b/p 139/84 will continue cozaar 100 mg daily   3. Protein calorie malnutrition: is without change albumin 3.0 will continue supplements as directed.   4. Aortic atherosclerosis: is without change will monitor   PREVIOUS   5. Major depression chronic is stable will continue lexapro 15 mg daily   6. GERD without esophagitis: is stable has history of GI bleed; will continue nexium 40 mg twice daily   7. Dementia without  behavioral disturbance unspecified dementia type: is stable weight is 121 pounds; is off aricept remains on remeron 7.5 mg nightly to maintain appetite.   8. Primary osteoarthritis multiple joints is stable will continue tylenol 1 gm three times daily  9. Post menopausal osteoporosis: is stable t score -5.678   Will continue fosamax 70 mg weekly and is on vitamin d and calcium     MD is aware of resident's narcotic use and is in agreement with current plan of care. We will attempt to wean resident as appropriate.  [name] NP [facility]  Contact [phone] Monday through Friday 8am- 5pm  After hours call [phone]

## 2020-01-03 DIAGNOSIS — I700 Atherosclerosis of aorta: Secondary | ICD-10-CM | POA: Insufficient documentation

## 2020-01-04 DIAGNOSIS — Z1159 Encounter for screening for other viral diseases: Secondary | ICD-10-CM | POA: Diagnosis not present

## 2020-01-04 DIAGNOSIS — J9601 Acute respiratory failure with hypoxia: Secondary | ICD-10-CM | POA: Diagnosis not present

## 2020-01-18 ENCOUNTER — Non-Acute Institutional Stay: Payer: Self-pay | Admitting: Adult Health

## 2020-01-18 ENCOUNTER — Encounter: Payer: Self-pay | Admitting: Adult Health

## 2020-01-18 DIAGNOSIS — E440 Moderate protein-calorie malnutrition: Secondary | ICD-10-CM | POA: Diagnosis not present

## 2020-01-18 DIAGNOSIS — F339 Major depressive disorder, recurrent, unspecified: Secondary | ICD-10-CM

## 2020-01-18 NOTE — Progress Notes (Signed)
Location:  [facility] Room Number: 122-W Place of Service:  SNF (31)   CODE STATUS: DNR  Allergies  Allergen Reactions  . Hydrocodone-Acetaminophen Nausea And Vomiting    Chief Complaint  Patient presents with  . Acute Visit    Medication review     HPI:  She is presently taking remeron 7.5 mg nightly to help with her appetite; when she comes off this medication she does stop eating. The care plan team, we have decided that this medication is important for her quality of life. She is taking lexapro 15 mg daily. There are no reports of agitation; anxiety. She does engage with those around her. She is due for a GDR. The care plan team, we have decided to lower her lexapro dose.    Past Medical History:  Diagnosis Date  . Anxiety   . Dementia (HCC)   . GERD (gastroesophageal reflux disease)   . Goiter   . Hypertension   . Osteoarthritis   . Osteoporosis   . Vitamin D insufficiency     Past Surgical History:  Procedure Laterality Date  . APPENDECTOMY    . KIDNEY STONE SURGERY    . ORIF HIP FRACTURE Right 11/20/2012   Procedure: OPEN REDUCTION INTERNAL FIXATION RIGHT HIP;  Surgeon: Darreld Mclean, MD;  Location: [facility];  Service: Orthopedics;  Laterality: Right;  . stress fractures of legs    . TUBAL LIGATION      Social History   Socioeconomic History  . Marital status: Widowed    Spouse name: Not on file  . Number of children: Not on file  . Years of education: Not on file  . Highest education level: Not on file  Occupational History  . Not on file  Tobacco Use  . Smoking status: Never Smoker  . Smokeless tobacco: Never Used  Vaping Use  . Vaping Use: Never used  Substance and Sexual Activity  . Alcohol use: No  . Drug use: No  . Sexual activity: Never  Other Topics Concern  . Not on file  Social History Narrative  . Not on file   Social Determinants of Health   Financial Resource Strain: Not on file  Food Insecurity: Not on  file  Transportation Needs: Not on file  Physical Activity: Not on file  Stress: Not on file  Social Connections: Not on file  Intimate Partner Violence: Not on file   Family History  Problem Relation Age of Onset  . Heart attack Father   . Heart disease Neg Hx       VITAL SIGNS BP (!) 138/55   Pulse (!) 58   Temp 98 F (36.7 C)   Resp 18   Ht 5' (1.524 m)   Wt 120 lb 12.8 oz (54.8 kg)   SpO2 95%   BMI 23.59 kg/m   Outpatient Encounter Medications as of 01/18/2020  Medication Sig  . acetaminophen (TYLENOL) 325 MG tablet Take 650 mg by mouth in the morning, at noon, and at bedtime.  Marland Kitchen alendronate (FOSAMAX) 70 MG tablet Take 70 mg by mouth once a week. Take with a full glass of water on an empty stomach.  . calcium carbonate (TUMS EX) 750 MG chewable tablet Chew 1 tablet by mouth 3 (three) times daily.  Marland Kitchen dextromethorphan-guaiFENesin (TUSSIN DM) 10-100 MG/5ML liquid Take 10 mLs by mouth every 6 (six) hours as needed for cough.  . escitalopram (LEXAPRO) 5 MG tablet Take 15 mg by mouth daily.  Marland Kitchen  esomeprazole (NEXIUM) 40 MG capsule Take 40 mg by mouth 2 (two) times daily before a meal.   . feeding supplement (ENSURE ENLIVE / ENSURE PLUS) LIQD Take 237 mLs by mouth 2 (two) times daily between meals.  Marland Kitchen loperamide (IMODIUM) 2 MG capsule Take 2 mg by mouth every 4 (four) hours as needed for diarrhea or loose stools.  Marland Kitchen losartan (COZAAR) 100 MG tablet Take 100 mg by mouth daily.  . magnesium hydroxide (MILK OF MAGNESIA) 400 MG/5ML suspension Take 30 mLs by mouth daily as needed (For Constipation).  . mirtazapine (REMERON) 7.5 MG tablet Take 7.5 mg by mouth at bedtime.  . NON FORMULARY Mechanical soft diet with ground meat  . OXYGEN Inhale 3 L into the lungs continuous.  . Vitamin D, Ergocalciferol, (DRISDOL) 1.25 MG (50000 UNIT) CAPS capsule Take 50,000 Units by mouth every 7 (seven) days.   No facility-administered encounter medications on file as of 01/18/2020.      SIGNIFICANT DIAGNOSTIC EXAMS  PREVIOUS  02-11-19: chest x-ray:  1. Bilateral perihilar and bibasilar streaky densities concerning for developing infiltrate. Follow-up recommended. 2. Probable small bilateral pleural effusions.  02-17-19: ct of chest/angio of chest:  Patchy bilateral ground-glass opacities, most pronounced in the right upper lobe most compatible with pneumonia.     Small bilateral pleural effusions with bilateral lower lobe atelectasis or pneumonia. Cardiomegaly, coronary artery disease. Moderate-sized hiatal hernia. Aortic Atherosclerosis   02-18-19: 2-d echo:   1. Left ventricular ejection fraction, by visual estimation, is 70 to 75%. The left ventricle has hyperdynamic function. There is no left ventricular hypertrophy.  2. Left ventricular diastolic parameters are indeterminate.  3. The left ventricle has no regional wall motion abnormalities.  4. Global right ventricle was not well visualized.The right ventricular size is not well visualized. Right vetricular wall thickness was not assessed.  5. Left atrial size was not well visualized.  6. Right atrial size was not well visualized.  7. The mitral valve was not well visualized. No evidence of mitral valve regurgitation. No evidence of mitral stenosis.  8. The tricuspid valve is not well visualized.  9. The aortic valve was not well visualized. Aortic valve regurgitation is not visualized. Technically difficult assement, grossly there is no significant aortic stenosis. 10. The pulmonic valve was not well visualized. Pulmonic valve regurgitation is not visualized. 11. The interatrial septum was not well visualized. 12. Technically difficult and limited study  12-16-19: DEXA: t score -5.678   NO NEW EXAMS.    LABS REVIEWED PREVIOUS   02-11-19: wbc 4.2; hgb 12.9; hct 39.8 mcv 96.4 plt 211; glucose 112; bun 25; creat 0.83; k+ 3.1; na++ 134; ca 8.6; liver normal albumin 3.3 blood culture: no growth 02-14-19:  glucose 129; bun 16; creat 0.56; k+ 3.8; na++ 140; ca 8.6 hgb a1c 6.3 02-17-19: wbc 7.6; hgb 11.2 hct 35.9; mcv 97.6 plt 256; glucose 114; bun 15; creat 0.48; k+ 4.1; na++ 138; ca 8.7; mag 2.1; tsh 0.369 free t4: 1.18; vit B12: 251 folate 7.3 d-dimer 1.51 02-19-19: wbc 8.6; hgb 11.0; hct 34.0; mcv 94.2 plt 313; glucose 115; bun 16; creat 0.63; k+ 3.6; na++ 134; ca 8.4 mag 1.9  02-26-19: wbc 11.8; hgb 11.9; hct 37.2; mcv 97.6 plt 337; glucose 127; bun 14; creat 0.66; k+ 4.1; na++ 137; ca 8.9;  03-02-19: wbc 4.0; hgb 11.5; hct 36.8; mcv 97.6 plt 286 03-17-19: wbc 9.1; hgb 12.2; hct 39.5; mcv 98.8 plt 343; glucose 156; bun 36; creat 1.06; k+ 3.9; na++  137; ca 9.4 liver normal albumin 3.2 + guaiac 03-21-19: + guaiac  03-22-19: wbc 5.1; hgb 11.0; hct 36.0; mcv 99.7 plt 265 03-25-19: guaiac neg X 3  04-22-19: tsh 0.317 free T3: 2.9 free T4; 0.98 06-03-19: wbc 6.3; hgb 10.8; hct 34.2; mcv 93.7 plt 185; glucose 117; bun 21; creat 0.67; k+ 3.7; na++ 140; ca 9.0  12-06-19; wbc 5.6; hgb 10.7; hct 34.2; mcv 996.9 plt 199; glucose 81; bun 17; creat 0.65; k+ 3.8; na++ 140; ca 8.7; liver normal albumin 3.0 tsh 0.491 free t3: 2.5 free t4: 0.74  NO NEW LABS.   Review of Systems  Unable to perform ROS: Dementia (unable to participate )     Physical Exam Constitutional:      General: She is not in acute distress.    Appearance: She is well-developed and well-nourished. She is not diaphoretic.     Comments: thin  Neck:     Thyroid: No thyromegaly.  Cardiovascular:     Rate and Rhythm: Normal rate and regular rhythm.     Pulses: Normal pulses and intact distal pulses.     Heart sounds: Normal heart sounds.  Pulmonary:     Effort: Pulmonary effort is normal. No respiratory distress.     Breath sounds: Normal breath sounds.     Comments: 02 Abdominal:     General: Bowel sounds are normal. There is no distension.     Palpations: Abdomen is soft.     Tenderness: There is no abdominal tenderness.  Musculoskeletal:         General: No edema. Normal range of motion.     Cervical back: Neck supple.     Right lower leg: No edema.     Left lower leg: No edema.     Comments: Kyphosis   Lymphadenopathy:     Cervical: No cervical adenopathy.  Skin:    General: Skin is warm and dry.  Neurological:     Mental Status: She is alert. Mental status is at baseline.  Psychiatric:        Mood and Affect: Mood and affect and mood normal.       ASSESSMENT/ PLAN:  TODAY  1. Protein calorie malnutrition moderate 2. Major depression recurrent chronic  Will lower her lexapro to 10 mg daily Will keep remeron 7.5 mg nightly at this time she failed wean X1.    MD is aware of resident's narcotic use and is in agreement with current plan of care. We will attempt to wean resident as appropriate.  [name] NP [facility]  Contact [phone] Monday through Friday 8am- 5pm  After hours call [phone]

## 2020-02-01 ENCOUNTER — Encounter: Payer: Self-pay | Admitting: Adult Health

## 2020-02-01 ENCOUNTER — Non-Acute Institutional Stay (SKILLED_NURSING_FACILITY): Payer: Medicare Other | Admitting: Adult Health

## 2020-02-01 DIAGNOSIS — K219 Gastro-esophageal reflux disease without esophagitis: Secondary | ICD-10-CM

## 2020-02-01 DIAGNOSIS — F0150 Vascular dementia without behavioral disturbance: Secondary | ICD-10-CM

## 2020-02-01 DIAGNOSIS — F339 Major depressive disorder, recurrent, unspecified: Secondary | ICD-10-CM

## 2020-02-01 NOTE — Progress Notes (Signed)
Location:  [facility] Room Number: 122/W Place of Service:  SNF (31)   CODE STATUS: DNR  Allergies  Allergen Reactions  . Hydrocodone-Acetaminophen Nausea And Vomiting    Chief Complaint  Patient presents with  . Medical Management of Chronic Issues         Major depression chronic:   GERD without esophagitis:  Vascular dementia without behavioral disturbance      HPI:  She is a 86 year old long term resident of this facility being seen for the management of her chronic illnesses: Major depression chronic:   GERD without esophagitis:  Vascular dementia without behavioral disturbance. There are no reports of agitation or anxiety. There are no reports of GI distress. Her weight is stable at 120 pounds; with a 1 pound weight loss over the past month.   Past Medical History:  Diagnosis Date  . Anxiety   . Dementia (HCC)   . GERD (gastroesophageal reflux disease)   . Goiter   . Hypertension   . Osteoarthritis   . Osteoporosis   . Vitamin D insufficiency     Past Surgical History:  Procedure Laterality Date  . APPENDECTOMY    . KIDNEY STONE SURGERY    . ORIF HIP FRACTURE Right 11/20/2012   Procedure: OPEN REDUCTION INTERNAL FIXATION RIGHT HIP;  Surgeon: Darreld Mclean, MD;  Location: [facility];  Service: Orthopedics;  Laterality: Right;  . stress fractures of legs    . TUBAL LIGATION      Social History   Socioeconomic History  . Marital status: Widowed    Spouse name: Not on file  . Number of children: Not on file  . Years of education: Not on file  . Highest education level: Not on file  Occupational History  . Not on file  Tobacco Use  . Smoking status: Never Smoker  . Smokeless tobacco: Never Used  Vaping Use  . Vaping Use: Never used  Substance and Sexual Activity  . Alcohol use: No  . Drug use: No  . Sexual activity: Never  Other Topics Concern  . Not on file  Social History Narrative  . Not on file   Social Determinants of  Health   Financial Resource Strain: Not on file  Food Insecurity: Not on file  Transportation Needs: Not on file  Physical Activity: Not on file  Stress: Not on file  Social Connections: Not on file  Intimate Partner Violence: Not on file   Family History  Problem Relation Age of Onset  . Heart attack Father   . Heart disease Neg Hx       VITAL SIGNS BP 123/71   Pulse 66   Temp (!) 97.5 F (36.4 C)   Resp 18   Ht 5' (1.524 m)   Wt 120 lb 12.8 oz (54.8 kg)   SpO2 94%   BMI 23.59 kg/m   Outpatient Encounter Medications as of 02/01/2020  Medication Sig  . acetaminophen (TYLENOL) 325 MG tablet Take 650 mg by mouth in the morning, at noon, and at bedtime.  Marland Kitchen alendronate (FOSAMAX) 70 MG tablet Take 70 mg by mouth once a week. Take with a full glass of water on an empty stomach.  . calcium carbonate (TUMS EX) 750 MG chewable tablet Chew 1 tablet by mouth 3 (three) times daily.  . Cholecalciferol 50 MCG (2000 UT) CAPS Take 1 capsule by mouth daily in the afternoon. Special Instructions: for supplement for osteoporosis  . dextromethorphan-guaiFENesin (ROBITUSSIN-DM) 10-100  MG/5ML liquid Take 10 mLs by mouth every 6 (six) hours as needed for cough.  . escitalopram (LEXAPRO) 5 MG tablet Take 15 mg by mouth daily.  Marland Kitchen esomeprazole (NEXIUM) 40 MG capsule Take 40 mg by mouth 2 (two) times daily before a meal.   . feeding supplement (ENSURE ENLIVE / ENSURE PLUS) LIQD Take 237 mLs by mouth 2 (two) times daily between meals.  Marland Kitchen loperamide (IMODIUM) 2 MG capsule Take 2 mg by mouth every 4 (four) hours as needed for diarrhea or loose stools.  Marland Kitchen losartan (COZAAR) 100 MG tablet Take 100 mg by mouth daily.  . magnesium hydroxide (MILK OF MAGNESIA) 400 MG/5ML suspension Take 30 mLs by mouth daily as needed (For Constipation).  . mirtazapine (REMERON) 7.5 MG tablet Take 7.5 mg by mouth at bedtime.  . NON FORMULARY Mechanical soft diet with ground meat  . OXYGEN Inhale 3 L into the lungs  continuous.  . [DISCONTINUED] Vitamin D, Ergocalciferol, (DRISDOL) 1.25 MG (50000 UNIT) CAPS capsule Take 50,000 Units by mouth every 7 (seven) days.   No facility-administered encounter medications on file as of 02/01/2020.     SIGNIFICANT DIAGNOSTIC EXAMS   PREVIOUS  02-11-19: chest x-ray:  1. Bilateral perihilar and bibasilar streaky densities concerning for developing infiltrate. Follow-up recommended. 2. Probable small bilateral pleural effusions.  02-17-19: ct of chest/angio of chest:  Patchy bilateral ground-glass opacities, most pronounced in the right upper lobe most compatible with pneumonia.     Small bilateral pleural effusions with bilateral lower lobe atelectasis or pneumonia. Cardiomegaly, coronary artery disease. Moderate-sized hiatal hernia. Aortic Atherosclerosis   02-18-19: 2-d echo:   1. Left ventricular ejection fraction, by visual estimation, is 70 to 75%. The left ventricle has hyperdynamic function. There is no left ventricular hypertrophy.  2. Left ventricular diastolic parameters are indeterminate.  3. The left ventricle has no regional wall motion abnormalities.  4. Global right ventricle was not well visualized.The right ventricular size is not well visualized. Right vetricular wall thickness was not assessed.  5. Left atrial size was not well visualized.  6. Right atrial size was not well visualized.  7. The mitral valve was not well visualized. No evidence of mitral valve regurgitation. No evidence of mitral stenosis.  8. The tricuspid valve is not well visualized.  9. The aortic valve was not well visualized. Aortic valve regurgitation is not visualized. Technically difficult assement, grossly there is no significant aortic stenosis. 10. The pulmonic valve was not well visualized. Pulmonic valve regurgitation is not visualized. 11. The interatrial septum was not well visualized. 12. Technically difficult and limited study  12-16-19: DEXA: t score -5.678    NO NEW EXAMS.    LABS REVIEWED PREVIOUS   02-11-19: wbc 4.2; hgb 12.9; hct 39.8 mcv 96.4 plt 211; glucose 112; bun 25; creat 0.83; k+ 3.1; na++ 134; ca 8.6; liver normal albumin 3.3 blood culture: no growth 02-14-19: glucose 129; bun 16; creat 0.56; k+ 3.8; na++ 140; ca 8.6 hgb a1c 6.3 02-17-19: wbc 7.6; hgb 11.2 hct 35.9; mcv 97.6 plt 256; glucose 114; bun 15; creat 0.48; k+ 4.1; na++ 138; ca 8.7; mag 2.1; tsh 0.369 free t4: 1.18; vit B12: 251 folate 7.3 d-dimer 1.51 02-19-19: wbc 8.6; hgb 11.0; hct 34.0; mcv 94.2 plt 313; glucose 115; bun 16; creat 0.63; k+ 3.6; na++ 134; ca 8.4 mag 1.9  02-26-19: wbc 11.8; hgb 11.9; hct 37.2; mcv 97.6 plt 337; glucose 127; bun 14; creat 0.66; k+ 4.1; na++ 137; ca 8.9;  03-02-19: wbc 4.0; hgb 11.5; hct 36.8; mcv 97.6 plt 286 03-17-19: wbc 9.1; hgb 12.2; hct 39.5; mcv 98.8 plt 343; glucose 156; bun 36; creat 1.06; k+ 3.9; na++ 137; ca 9.4 liver normal albumin 3.2 + guaiac 03-21-19: + guaiac  03-22-19: wbc 5.1; hgb 11.0; hct 36.0; mcv 99.7 plt 265 03-25-19: guaiac neg X 3  04-22-19: tsh 0.317 free T3: 2.9 free T4; 0.98 06-03-19: wbc 6.3; hgb 10.8; hct 34.2; mcv 93.7 plt 185; glucose 117; bun 21; creat 0.67; k+ 3.7; na++ 140; ca 9.0  12-06-19; wbc 5.6; hgb 10.7; hct 34.2; mcv 996.9 plt 199; glucose 81; bun 17; creat 0.65; k+ 3.8; na++ 140; ca 8.7; liver normal albumin 3.0 tsh 0.491 free t3: 2.5 free t4: 0.74  NO NEW LABS.   Review of Systems  Unable to perform ROS: Dementia (unable to participate )    Physical Exam Constitutional:      General: She is not in acute distress.    Appearance: She is well-developed and well-nourished. She is not diaphoretic.     Comments: thin  Neck:     Thyroid: No thyromegaly.  Cardiovascular:     Rate and Rhythm: Normal rate and regular rhythm.     Pulses: Intact distal pulses.     Heart sounds: Normal heart sounds.  Pulmonary:     Effort: Pulmonary effort is normal. No respiratory distress.     Breath sounds: Normal breath  sounds.     Comments: 02 Abdominal:     General: Bowel sounds are normal. There is no distension.     Palpations: Abdomen is soft.     Tenderness: There is no abdominal tenderness.  Musculoskeletal:        General: No edema. Normal range of motion.     Cervical back: Neck supple.     Right lower leg: No edema.     Left lower leg: No edema.     Comments: Kyphosis   Lymphadenopathy:     Cervical: No cervical adenopathy.  Skin:    General: Skin is warm and dry.  Neurological:     Mental Status: She is alert.  Psychiatric:        Mood and Affect: Mood and affect and mood normal.      ASSESSMENT/ PLAN:  TODAY  1. Major depression chronic: is stable will continue lexapro 10 mg daily is tolerating wean.   2. GERD without esophagitis: is stable has history of GI bleed; will continue nexium 40 mg twice daily   3. Vascular dementia without behavioral disturbance weight is stable at 120 pounds; is off aricept; remains on remeron 7.5 mg to maintain appetite; (has failed one wean)    PREVIOUS   4. Primary osteoarthritis multiple joints is stable will continue tylenol 1 gm three times daily  5. Post menopausal osteoporosis: is stable t score -5.678  Will continue fosamax 70 mg weekly and is on vitamin d and calcium    6. Goiter: is stable tsh 0.941 will monitor   7. Essential hypertension: is stable b/p 123/71 will continue cozaar 100 mg daily   8. Protein calorie malnutrition: is without change albumin 3.0 will continue supplements as directed.   9. Aortic atherosclerosis: is without change will monitor     MD is aware of resident's narcotic use and is in agreement with current plan of care. We will attempt to wean resident as appropriate.  [name] NP [facility]  Contact [phone] Monday through Friday 8am- 5pm  After hours call [phone]

## 2020-02-02 DIAGNOSIS — Z1159 Encounter for screening for other viral diseases: Secondary | ICD-10-CM | POA: Diagnosis not present

## 2020-02-02 DIAGNOSIS — J9601 Acute respiratory failure with hypoxia: Secondary | ICD-10-CM | POA: Diagnosis not present

## 2020-02-03 DIAGNOSIS — R279 Unspecified lack of coordination: Secondary | ICD-10-CM | POA: Diagnosis not present

## 2020-02-03 DIAGNOSIS — J9611 Chronic respiratory failure with hypoxia: Secondary | ICD-10-CM | POA: Diagnosis not present

## 2020-02-03 DIAGNOSIS — M159 Polyosteoarthritis, unspecified: Secondary | ICD-10-CM | POA: Diagnosis not present

## 2020-02-03 DIAGNOSIS — M6281 Muscle weakness (generalized): Secondary | ICD-10-CM | POA: Diagnosis not present

## 2020-02-04 ENCOUNTER — Encounter: Payer: Self-pay | Admitting: Internal Medicine

## 2020-02-04 ENCOUNTER — Other Ambulatory Visit (HOSPITAL_COMMUNITY)
Admission: RE | Admit: 2020-02-04 | Discharge: 2020-02-04 | Disposition: A | Discharge status: Home / Self Care | Payer: Medicare Other | Source: Skilled Nursing Facility | Attending: Adult Health | Admitting: Adult Health

## 2020-02-04 DIAGNOSIS — R279 Unspecified lack of coordination: Secondary | ICD-10-CM | POA: Diagnosis not present

## 2020-02-04 DIAGNOSIS — J9611 Chronic respiratory failure with hypoxia: Secondary | ICD-10-CM | POA: Insufficient documentation

## 2020-02-04 DIAGNOSIS — J9601 Acute respiratory failure with hypoxia: Secondary | ICD-10-CM | POA: Diagnosis not present

## 2020-02-04 DIAGNOSIS — M159 Polyosteoarthritis, unspecified: Secondary | ICD-10-CM | POA: Diagnosis not present

## 2020-02-04 DIAGNOSIS — Z1159 Encounter for screening for other viral diseases: Secondary | ICD-10-CM | POA: Diagnosis not present

## 2020-02-04 DIAGNOSIS — U071 COVID-19: Secondary | ICD-10-CM | POA: Insufficient documentation

## 2020-02-04 DIAGNOSIS — M6281 Muscle weakness (generalized): Secondary | ICD-10-CM | POA: Diagnosis not present

## 2020-02-04 LAB — C-REACTIVE PROTEIN: CRP: 0.6 mg/dL (ref <1.0)

## 2020-02-04 LAB — D-DIMER, QUANTITATIVE: D-Dimer, Quant: 4.46 ug/mL-FEU — ABNORMAL HIGH (ref 0.00–0.50)

## 2020-02-07 DIAGNOSIS — R279 Unspecified lack of coordination: Secondary | ICD-10-CM | POA: Diagnosis not present

## 2020-02-07 DIAGNOSIS — J9611 Chronic respiratory failure with hypoxia: Secondary | ICD-10-CM | POA: Diagnosis not present

## 2020-02-07 DIAGNOSIS — M6281 Muscle weakness (generalized): Secondary | ICD-10-CM | POA: Diagnosis not present

## 2020-02-07 DIAGNOSIS — J9601 Acute respiratory failure with hypoxia: Secondary | ICD-10-CM | POA: Diagnosis not present

## 2020-02-07 DIAGNOSIS — M159 Polyosteoarthritis, unspecified: Secondary | ICD-10-CM | POA: Diagnosis not present

## 2020-02-07 DIAGNOSIS — Z1159 Encounter for screening for other viral diseases: Secondary | ICD-10-CM | POA: Diagnosis not present

## 2020-02-08 DIAGNOSIS — M6281 Muscle weakness (generalized): Secondary | ICD-10-CM | POA: Diagnosis not present

## 2020-02-08 DIAGNOSIS — J9611 Chronic respiratory failure with hypoxia: Secondary | ICD-10-CM | POA: Diagnosis not present

## 2020-02-08 DIAGNOSIS — R279 Unspecified lack of coordination: Secondary | ICD-10-CM | POA: Diagnosis not present

## 2020-02-08 DIAGNOSIS — M159 Polyosteoarthritis, unspecified: Secondary | ICD-10-CM | POA: Diagnosis not present

## 2020-02-09 DIAGNOSIS — M6281 Muscle weakness (generalized): Secondary | ICD-10-CM | POA: Diagnosis not present

## 2020-02-09 DIAGNOSIS — R279 Unspecified lack of coordination: Secondary | ICD-10-CM | POA: Diagnosis not present

## 2020-02-09 DIAGNOSIS — J9611 Chronic respiratory failure with hypoxia: Secondary | ICD-10-CM | POA: Diagnosis not present

## 2020-02-09 DIAGNOSIS — M159 Polyosteoarthritis, unspecified: Secondary | ICD-10-CM | POA: Diagnosis not present

## 2020-02-10 DIAGNOSIS — M6281 Muscle weakness (generalized): Secondary | ICD-10-CM | POA: Diagnosis not present

## 2020-02-10 DIAGNOSIS — J9611 Chronic respiratory failure with hypoxia: Secondary | ICD-10-CM | POA: Diagnosis not present

## 2020-02-10 DIAGNOSIS — R279 Unspecified lack of coordination: Secondary | ICD-10-CM | POA: Diagnosis not present

## 2020-02-10 DIAGNOSIS — M159 Polyosteoarthritis, unspecified: Secondary | ICD-10-CM | POA: Diagnosis not present

## 2020-02-11 DIAGNOSIS — R279 Unspecified lack of coordination: Secondary | ICD-10-CM | POA: Diagnosis not present

## 2020-02-11 DIAGNOSIS — J9611 Chronic respiratory failure with hypoxia: Secondary | ICD-10-CM | POA: Diagnosis not present

## 2020-02-11 DIAGNOSIS — M159 Polyosteoarthritis, unspecified: Secondary | ICD-10-CM | POA: Diagnosis not present

## 2020-02-11 DIAGNOSIS — M6281 Muscle weakness (generalized): Secondary | ICD-10-CM | POA: Diagnosis not present

## 2020-02-14 DIAGNOSIS — M159 Polyosteoarthritis, unspecified: Secondary | ICD-10-CM | POA: Diagnosis not present

## 2020-02-14 DIAGNOSIS — J9611 Chronic respiratory failure with hypoxia: Secondary | ICD-10-CM | POA: Diagnosis not present

## 2020-02-14 DIAGNOSIS — R279 Unspecified lack of coordination: Secondary | ICD-10-CM | POA: Diagnosis not present

## 2020-02-14 DIAGNOSIS — M6281 Muscle weakness (generalized): Secondary | ICD-10-CM | POA: Diagnosis not present

## 2020-02-15 DIAGNOSIS — R279 Unspecified lack of coordination: Secondary | ICD-10-CM | POA: Diagnosis not present

## 2020-02-15 DIAGNOSIS — M6281 Muscle weakness (generalized): Secondary | ICD-10-CM | POA: Diagnosis not present

## 2020-02-15 DIAGNOSIS — J9611 Chronic respiratory failure with hypoxia: Secondary | ICD-10-CM | POA: Diagnosis not present

## 2020-02-15 DIAGNOSIS — M159 Polyosteoarthritis, unspecified: Secondary | ICD-10-CM | POA: Diagnosis not present

## 2020-02-16 DIAGNOSIS — J9611 Chronic respiratory failure with hypoxia: Secondary | ICD-10-CM | POA: Diagnosis not present

## 2020-02-16 DIAGNOSIS — M6281 Muscle weakness (generalized): Secondary | ICD-10-CM | POA: Diagnosis not present

## 2020-02-16 DIAGNOSIS — M159 Polyosteoarthritis, unspecified: Secondary | ICD-10-CM | POA: Diagnosis not present

## 2020-02-16 DIAGNOSIS — Z1159 Encounter for screening for other viral diseases: Secondary | ICD-10-CM | POA: Diagnosis not present

## 2020-02-16 DIAGNOSIS — J9601 Acute respiratory failure with hypoxia: Secondary | ICD-10-CM | POA: Diagnosis not present

## 2020-02-16 DIAGNOSIS — R279 Unspecified lack of coordination: Secondary | ICD-10-CM | POA: Diagnosis not present

## 2020-02-17 DIAGNOSIS — M159 Polyosteoarthritis, unspecified: Secondary | ICD-10-CM | POA: Diagnosis not present

## 2020-02-17 DIAGNOSIS — R279 Unspecified lack of coordination: Secondary | ICD-10-CM | POA: Diagnosis not present

## 2020-02-17 DIAGNOSIS — J9611 Chronic respiratory failure with hypoxia: Secondary | ICD-10-CM | POA: Diagnosis not present

## 2020-02-17 DIAGNOSIS — M6281 Muscle weakness (generalized): Secondary | ICD-10-CM | POA: Diagnosis not present

## 2020-02-18 ENCOUNTER — Encounter: Payer: Self-pay | Admitting: Adult Health

## 2020-02-18 ENCOUNTER — Non-Acute Institutional Stay (SKILLED_NURSING_FACILITY): Payer: Medicare Other | Admitting: Adult Health

## 2020-02-18 DIAGNOSIS — M6281 Muscle weakness (generalized): Secondary | ICD-10-CM | POA: Diagnosis not present

## 2020-02-18 DIAGNOSIS — L03211 Cellulitis of face: Secondary | ICD-10-CM | POA: Diagnosis not present

## 2020-02-18 DIAGNOSIS — J9611 Chronic respiratory failure with hypoxia: Secondary | ICD-10-CM | POA: Diagnosis not present

## 2020-02-18 DIAGNOSIS — R279 Unspecified lack of coordination: Secondary | ICD-10-CM | POA: Diagnosis not present

## 2020-02-18 DIAGNOSIS — M159 Polyosteoarthritis, unspecified: Secondary | ICD-10-CM | POA: Diagnosis not present

## 2020-02-18 NOTE — Progress Notes (Signed)
Location:  [facility] Room Number: 122-W Place of Service:  SNF (31)   CODE STATUS: DNR  Allergies  Allergen Reactions  . Hydrocodone-Acetaminophen Nausea And Vomiting    Chief Complaint  Patient presents with  . Acute Visit    Right jaw redness    HPI:  Staff report that this AM her right jaw is swollen and red and warm. She does not indicate any pain; she is unable to cooperate with an oral exam. There are no reports of fevers present.   Past Medical History:  Diagnosis Date  . Anxiety   . Dementia (HCC)   . GERD (gastroesophageal reflux disease)   . Goiter   . Hypertension   . Osteoarthritis   . Osteoporosis   . Vitamin D insufficiency     Past Surgical History:  Procedure Laterality Date  . APPENDECTOMY    . KIDNEY STONE SURGERY    . ORIF HIP FRACTURE Right 11/20/2012   Procedure: OPEN REDUCTION INTERNAL FIXATION RIGHT HIP;  Surgeon: Darreld Mclean, MD;  Location: [facility];  Service: Orthopedics;  Laterality: Right;  . stress fractures of legs    . TUBAL LIGATION      Social History   Socioeconomic History  . Marital status: Widowed    Spouse name: Not on file  . Number of children: Not on file  . Years of education: Not on file  . Highest education level: Not on file  Occupational History  . Not on file  Tobacco Use  . Smoking status: Never Smoker  . Smokeless tobacco: Never Used  Vaping Use  . Vaping Use: Never used  Substance and Sexual Activity  . Alcohol use: No  . Drug use: No  . Sexual activity: Never  Other Topics Concern  . Not on file  Social History Narrative  . Not on file   Social Determinants of Health   Financial Resource Strain: Not on file  Food Insecurity: Not on file  Transportation Needs: Not on file  Physical Activity: Not on file  Stress: Not on file  Social Connections: Not on file  Intimate Partner Violence: Not on file   Family History  Problem Relation Age of Onset  . Heart attack Father    . Heart disease Neg Hx       VITAL SIGNS BP 139/69   Pulse 63   Temp 98.4 F (36.9 C)   Resp 18   Ht 5' (1.524 m)   Wt 120 lb 12.8 oz (54.8 kg)   SpO2 96%   BMI 23.59 kg/m   Outpatient Encounter Medications as of 02/18/2020  Medication Sig  . acetaminophen (TYLENOL) 325 MG tablet Take 650 mg by mouth in the morning, at noon, and at bedtime.  Marland Kitchen alendronate (FOSAMAX) 70 MG tablet Take 70 mg by mouth once a week. Take with a full glass of water on an empty stomach.  Marland Kitchen amoxicillin-clavulanate (AUGMENTIN) 500-125 MG tablet Take 1 tablet by mouth 2 (two) times daily.  Marland Kitchen apixaban (ELIQUIS) 2.5 MG TABS tablet Take 2.5 mg by mouth 2 (two) times daily.  . calcium carbonate (TUMS EX) 750 MG chewable tablet Chew 1 tablet by mouth 3 (three) times daily.  . Cholecalciferol 50 MCG (2000 UT) CAPS Take 1 capsule by mouth daily in the afternoon. Special Instructions: for supplement for osteoporosis  . dextromethorphan-guaiFENesin (ROBITUSSIN-DM) 10-100 MG/5ML liquid Take 10 mLs by mouth every 6 (six) hours as needed for cough.  . escitalopram (LEXAPRO) 5  MG tablet Take 10 mg by mouth daily.  Marland Kitchen esomeprazole (NEXIUM) 40 MG capsule Take 40 mg by mouth daily at 12 noon.  . feeding supplement (ENSURE ENLIVE / ENSURE PLUS) LIQD Take 237 mLs by mouth 2 (two) times daily between meals.  Marland Kitchen loperamide (IMODIUM) 2 MG capsule Take 2 mg by mouth every 4 (four) hours as needed for diarrhea or loose stools.  Marland Kitchen losartan (COZAAR) 100 MG tablet Take 100 mg by mouth daily.  . magnesium hydroxide (MILK OF MAGNESIA) 400 MG/5ML suspension Take 30 mLs by mouth daily as needed (For Constipation).  . mirtazapine (REMERON) 7.5 MG tablet Take 7.5 mg by mouth at bedtime.  . NON FORMULARY Mechanical soft diet with ground meat  . OXYGEN Inhale 3 L into the lungs continuous.   No facility-administered encounter medications on file as of 02/18/2020.     SIGNIFICANT DIAGNOSTIC EXAMS   PREVIOUS  02-11-19: chest x-ray:  1.  Bilateral perihilar and bibasilar streaky densities concerning for developing infiltrate. Follow-up recommended. 2. Probable small bilateral pleural effusions.  02-17-19: ct of chest/angio of chest:  Patchy bilateral ground-glass opacities, most pronounced in the right upper lobe most compatible with pneumonia.     Small bilateral pleural effusions with bilateral lower lobe atelectasis or pneumonia. Cardiomegaly, coronary artery disease. Moderate-sized hiatal hernia. Aortic Atherosclerosis   02-18-19: 2-d echo:   1. Left ventricular ejection fraction, by visual estimation, is 70 to 75%. The left ventricle has hyperdynamic function. There is no left ventricular hypertrophy.  2. Left ventricular diastolic parameters are indeterminate.  3. The left ventricle has no regional wall motion abnormalities.  4. Global right ventricle was not well visualized.The right ventricular size is not well visualized. Right vetricular wall thickness was not assessed.  5. Left atrial size was not well visualized.  6. Right atrial size was not well visualized.  7. The mitral valve was not well visualized. No evidence of mitral valve regurgitation. No evidence of mitral stenosis.  8. The tricuspid valve is not well visualized.  9. The aortic valve was not well visualized. Aortic valve regurgitation is not visualized. Technically difficult assement, grossly there is no significant aortic stenosis. 10. The pulmonic valve was not well visualized. Pulmonic valve regurgitation is not visualized. 11. The interatrial septum was not well visualized. 12. Technically difficult and limited study  12-16-19: DEXA: t score -5.678   NO NEW EXAMS.    LABS REVIEWED PREVIOUS   02-11-19: wbc 4.2; hgb 12.9; hct 39.8 mcv 96.4 plt 211; glucose 112; bun 25; creat 0.83; k+ 3.1; na++ 134; ca 8.6; liver normal albumin 3.3 blood culture: no growth 02-14-19: glucose 129; bun 16; creat 0.56; k+ 3.8; na++ 140; ca 8.6 hgb a1c 6.3 02-17-19: wbc  7.6; hgb 11.2 hct 35.9; mcv 97.6 plt 256; glucose 114; bun 15; creat 0.48; k+ 4.1; na++ 138; ca 8.7; mag 2.1; tsh 0.369 free t4: 1.18; vit B12: 251 folate 7.3 d-dimer 1.51 02-19-19: wbc 8.6; hgb 11.0; hct 34.0; mcv 94.2 plt 313; glucose 115; bun 16; creat 0.63; k+ 3.6; na++ 134; ca 8.4 mag 1.9  02-26-19: wbc 11.8; hgb 11.9; hct 37.2; mcv 97.6 plt 337; glucose 127; bun 14; creat 0.66; k+ 4.1; na++ 137; ca 8.9;  03-02-19: wbc 4.0; hgb 11.5; hct 36.8; mcv 97.6 plt 286 03-17-19: wbc 9.1; hgb 12.2; hct 39.5; mcv 98.8 plt 343; glucose 156; bun 36; creat 1.06; k+ 3.9; na++ 137; ca 9.4 liver normal albumin 3.2 + guaiac 03-21-19: + guaiac  03-22-19:  wbc 5.1; hgb 11.0; hct 36.0; mcv 99.7 plt 265 03-25-19: guaiac neg X 3  04-22-19: tsh 0.317 free T3: 2.9 free T4; 0.98 06-03-19: wbc 6.3; hgb 10.8; hct 34.2; mcv 93.7 plt 185; glucose 117; bun 21; creat 0.67; k+ 3.7; na++ 140; ca 9.0  12-06-19; wbc 5.6; hgb 10.7; hct 34.2; mcv 996.9 plt 199; glucose 81; bun 17; creat 0.65; k+ 3.8; na++ 140; ca 8.7; liver normal albumin 3.0 tsh 0.491 free t3: 2.5 free t4: 0.74  NO NEW LABS.   Review of Systems  Unable to perform ROS: Dementia (unable to participate )    Physical Exam Constitutional:      General: She is not in acute distress.    Appearance: She is well-developed and well-nourished. She is not diaphoretic.     Comments: thin  Neck:     Thyroid: No thyromegaly.  Cardiovascular:     Rate and Rhythm: Normal rate and regular rhythm.     Pulses: Normal pulses and intact distal pulses.     Heart sounds: Normal heart sounds.  Pulmonary:     Effort: Pulmonary effort is normal. No respiratory distress.     Breath sounds: Normal breath sounds.     Comments: 02 Abdominal:     General: Bowel sounds are normal. There is no distension.     Palpations: Abdomen is soft.     Tenderness: There is no abdominal tenderness.  Musculoskeletal:        General: No edema. Normal range of motion.     Cervical back: Neck supple.      Right lower leg: No edema.     Left lower leg: No edema.     Comments: Kyphosis   Lymphadenopathy:     Cervical: No cervical adenopathy.  Skin:    General: Skin is warm and dry.     Comments: Right jaw is red and warm and swollen. No tenderness present.   Neurological:     Mental Status: She is alert. Mental status is at baseline.  Psychiatric:        Mood and Affect: Mood and affect and mood normal.       ASSESSMENT/ PLAN:  TODAY  1. Right jaw cellulitis  Is worse Will begin augmentin 500 mg twice daily through 02-25-20 and will monitor her status   MD is aware of resident's narcotic use and is in agreement with current plan of care. We will attempt to wean resident as appropriate.  [name] NP [facility]  Contact [phone] Monday through Friday 8am- 5pm  After hours call [phone]

## 2020-02-21 DIAGNOSIS — R279 Unspecified lack of coordination: Secondary | ICD-10-CM | POA: Diagnosis not present

## 2020-02-21 DIAGNOSIS — M159 Polyosteoarthritis, unspecified: Secondary | ICD-10-CM | POA: Diagnosis not present

## 2020-02-21 DIAGNOSIS — M6281 Muscle weakness (generalized): Secondary | ICD-10-CM | POA: Diagnosis not present

## 2020-02-21 DIAGNOSIS — J9611 Chronic respiratory failure with hypoxia: Secondary | ICD-10-CM | POA: Diagnosis not present

## 2020-02-22 DIAGNOSIS — R279 Unspecified lack of coordination: Secondary | ICD-10-CM | POA: Diagnosis not present

## 2020-02-22 DIAGNOSIS — M6281 Muscle weakness (generalized): Secondary | ICD-10-CM | POA: Diagnosis not present

## 2020-02-22 DIAGNOSIS — J9611 Chronic respiratory failure with hypoxia: Secondary | ICD-10-CM | POA: Diagnosis not present

## 2020-02-22 DIAGNOSIS — M159 Polyosteoarthritis, unspecified: Secondary | ICD-10-CM | POA: Diagnosis not present

## 2020-02-23 DIAGNOSIS — J9611 Chronic respiratory failure with hypoxia: Secondary | ICD-10-CM | POA: Diagnosis not present

## 2020-02-23 DIAGNOSIS — M6281 Muscle weakness (generalized): Secondary | ICD-10-CM | POA: Diagnosis not present

## 2020-02-23 DIAGNOSIS — J9601 Acute respiratory failure with hypoxia: Secondary | ICD-10-CM | POA: Diagnosis not present

## 2020-02-23 DIAGNOSIS — Z1159 Encounter for screening for other viral diseases: Secondary | ICD-10-CM | POA: Diagnosis not present

## 2020-02-23 DIAGNOSIS — R279 Unspecified lack of coordination: Secondary | ICD-10-CM | POA: Diagnosis not present

## 2020-02-23 DIAGNOSIS — M159 Polyosteoarthritis, unspecified: Secondary | ICD-10-CM | POA: Diagnosis not present

## 2020-02-24 ENCOUNTER — Non-Acute Institutional Stay (SKILLED_NURSING_FACILITY): Payer: Medicare Other | Admitting: Adult Health

## 2020-02-24 ENCOUNTER — Encounter: Payer: Self-pay | Admitting: Adult Health

## 2020-02-24 DIAGNOSIS — J9611 Chronic respiratory failure with hypoxia: Secondary | ICD-10-CM | POA: Diagnosis not present

## 2020-02-24 DIAGNOSIS — E440 Moderate protein-calorie malnutrition: Secondary | ICD-10-CM

## 2020-02-24 DIAGNOSIS — F0150 Vascular dementia without behavioral disturbance: Secondary | ICD-10-CM | POA: Diagnosis not present

## 2020-02-24 DIAGNOSIS — M6281 Muscle weakness (generalized): Secondary | ICD-10-CM | POA: Diagnosis not present

## 2020-02-24 DIAGNOSIS — I700 Atherosclerosis of aorta: Secondary | ICD-10-CM

## 2020-02-24 DIAGNOSIS — M159 Polyosteoarthritis, unspecified: Secondary | ICD-10-CM | POA: Diagnosis not present

## 2020-02-24 DIAGNOSIS — R279 Unspecified lack of coordination: Secondary | ICD-10-CM | POA: Diagnosis not present

## 2020-02-24 NOTE — Progress Notes (Signed)
Location:  [facility] Room Number: 122/W Place of Service:  SNF (31)   CODE STATUS: DNR  Allergies  Allergen Reactions  . Hydrocodone-Acetaminophen Nausea And Vomiting    Chief Complaint  Patient presents with  . Acute Visit    Care Plan Meeting     HPI:  We have come together for her care plan meeting. No BIMS: mood 5/30. She requires extensive assist to independent with adls. Is incontinent of bladder and bowel. She is 02 dependent. Her weight is stable at 120.8 pounds appetite is fair to good; is taking supplements. No reports of falls. She is not ambulatory. She is to complete her augmentin in the AM for her swollen jaw. There are no reports of uncontrolled pain. She continues to be followed for her chronic illnesses including: Vascular dementia without behavioral disturbance Aortic atherosclerosis  Moderate protein calorie malnutrition  Past Medical History:  Diagnosis Date  . Anxiety   . Dementia (HCC)   . GERD (gastroesophageal reflux disease)   . Goiter   . Hypertension   . Osteoarthritis   . Osteoporosis   . Vitamin D insufficiency     Past Surgical History:  Procedure Laterality Date  . APPENDECTOMY    . KIDNEY STONE SURGERY    . ORIF HIP FRACTURE Right 11/20/2012   Procedure: OPEN REDUCTION INTERNAL FIXATION RIGHT HIP;  Surgeon: Darreld Mclean, MD;  Location: [facility];  Service: Orthopedics;  Laterality: Right;  . stress fractures of legs    . TUBAL LIGATION      Social History   Socioeconomic History  . Marital status: Widowed    Spouse name: Not on file  . Number of children: Not on file  . Years of education: Not on file  . Highest education level: Not on file  Occupational History  . Not on file  Tobacco Use  . Smoking status: Never Smoker  . Smokeless tobacco: Never Used  Vaping Use  . Vaping Use: Never used  Substance and Sexual Activity  . Alcohol use: No  . Drug use: No  . Sexual activity: Never  Other Topics  Concern  . Not on file  Social History Narrative  . Not on file   Social Determinants of Health   Financial Resource Strain: Not on file  Food Insecurity: Not on file  Transportation Needs: Not on file  Physical Activity: Not on file  Stress: Not on file  Social Connections: Not on file  Intimate Partner Violence: Not on file   Family History  Problem Relation Age of Onset  . Heart attack Father   . Heart disease Neg Hx       VITAL SIGNS BP 114/71   Pulse 82   Temp 98.1 F (36.7 C)   Resp (!) 24   Ht 5' (1.524 m)   Wt 120 lb 12.8 oz (54.8 kg)   SpO2 95%   BMI 23.59 kg/m   Outpatient Encounter Medications as of 02/24/2020  Medication Sig  . acetaminophen (TYLENOL) 325 MG tablet Take 650 mg by mouth in the morning, at noon, and at bedtime.  Marland Kitchen alendronate (FOSAMAX) 70 MG tablet Take 70 mg by mouth once a week. Take with a full glass of water on an empty stomach.  Marland Kitchen amoxicillin-clavulanate (AUGMENTIN) 500-125 MG tablet Take 1 tablet by mouth 2 (two) times daily.  . calcium carbonate (TUMS EX) 750 MG chewable tablet Chew 1 tablet by mouth 3 (three) times daily.  . Cholecalciferol 50  MCG (2000 UT) CAPS Take 1 capsule by mouth daily in the afternoon. Special Instructions: for supplement for osteoporosis  . dextromethorphan-guaiFENesin (ROBITUSSIN-DM) 10-100 MG/5ML liquid Take 10 mLs by mouth every 6 (six) hours as needed for cough.  . escitalopram (LEXAPRO) 5 MG tablet Take 10 mg by mouth daily.  Marland Kitchen esomeprazole (NEXIUM) 40 MG capsule Take 40 mg by mouth daily at 12 noon.  . feeding supplement (ENSURE ENLIVE / ENSURE PLUS) LIQD Take 237 mLs by mouth 2 (two) times daily between meals.  Marland Kitchen loperamide (IMODIUM) 2 MG capsule Take 2 mg by mouth every 4 (four) hours as needed for diarrhea or loose stools.  Marland Kitchen losartan (COZAAR) 100 MG tablet Take 100 mg by mouth daily.  . magnesium hydroxide (MILK OF MAGNESIA) 400 MG/5ML suspension Take 30 mLs by mouth daily as needed (For  Constipation).  . mirtazapine (REMERON) 7.5 MG tablet Take 7.5 mg by mouth at bedtime.  . NON FORMULARY Mechanical soft diet with ground meat  . OXYGEN Inhale 3 L into the lungs continuous.  . [DISCONTINUED] apixaban (ELIQUIS) 2.5 MG TABS tablet Take 2.5 mg by mouth 2 (two) times daily.  . [DISCONTINUED] OXYGEN Inhale 3 L into the lungs continuous.   No facility-administered encounter medications on file as of 02/24/2020.     SIGNIFICANT DIAGNOSTIC EXAMS  PREVIOUS  02-11-19: chest x-ray:  1. Bilateral perihilar and bibasilar streaky densities concerning for developing infiltrate. Follow-up recommended. 2. Probable small bilateral pleural effusions.  02-17-19: ct of chest/angio of chest:  Patchy bilateral ground-glass opacities, most pronounced in the right upper lobe most compatible with pneumonia.     Small bilateral pleural effusions with bilateral lower lobe atelectasis or pneumonia. Cardiomegaly, coronary artery disease. Moderate-sized hiatal hernia. Aortic Atherosclerosis   02-18-19: 2-d echo:   1. Left ventricular ejection fraction, by visual estimation, is 70 to 75%. The left ventricle has hyperdynamic function. There is no left ventricular hypertrophy.  2. Left ventricular diastolic parameters are indeterminate.  3. The left ventricle has no regional wall motion abnormalities.  4. Global right ventricle was not well visualized.The right ventricular size is not well visualized. Right vetricular wall thickness was not assessed.  5. Left atrial size was not well visualized.  6. Right atrial size was not well visualized.  7. The mitral valve was not well visualized. No evidence of mitral valve regurgitation. No evidence of mitral stenosis.  8. The tricuspid valve is not well visualized.  9. The aortic valve was not well visualized. Aortic valve regurgitation is not visualized. Technically difficult assement, grossly there is no significant aortic stenosis. 10. The pulmonic valve  was not well visualized. Pulmonic valve regurgitation is not visualized. 11. The interatrial septum was not well visualized. 12. Technically difficult and limited study  12-16-19: DEXA: t score -5.678   NO NEW EXAMS.    LABS REVIEWED PREVIOUS   02-11-19: wbc 4.2; hgb 12.9; hct 39.8 mcv 96.4 plt 211; glucose 112; bun 25; creat 0.83; k+ 3.1; na++ 134; ca 8.6; liver normal albumin 3.3 blood culture: no growth 02-14-19: glucose 129; bun 16; creat 0.56; k+ 3.8; na++ 140; ca 8.6 hgb a1c 6.3 02-17-19: wbc 7.6; hgb 11.2 hct 35.9; mcv 97.6 plt 256; glucose 114; bun 15; creat 0.48; k+ 4.1; na++ 138; ca 8.7; mag 2.1; tsh 0.369 free t4: 1.18; vit B12: 251 folate 7.3 d-dimer 1.51 02-19-19: wbc 8.6; hgb 11.0; hct 34.0; mcv 94.2 plt 313; glucose 115; bun 16; creat 0.63; k+ 3.6; na++ 134; ca 8.4  mag 1.9  02-26-19: wbc 11.8; hgb 11.9; hct 37.2; mcv 97.6 plt 337; glucose 127; bun 14; creat 0.66; k+ 4.1; na++ 137; ca 8.9;  03-02-19: wbc 4.0; hgb 11.5; hct 36.8; mcv 97.6 plt 286 03-17-19: wbc 9.1; hgb 12.2; hct 39.5; mcv 98.8 plt 343; glucose 156; bun 36; creat 1.06; k+ 3.9; na++ 137; ca 9.4 liver normal albumin 3.2 + guaiac 03-21-19: + guaiac  03-22-19: wbc 5.1; hgb 11.0; hct 36.0; mcv 99.7 plt 265 03-25-19: guaiac neg X 3  04-22-19: tsh 0.317 free T3: 2.9 free T4; 0.98 06-03-19: wbc 6.3; hgb 10.8; hct 34.2; mcv 93.7 plt 185; glucose 117; bun 21; creat 0.67; k+ 3.7; na++ 140; ca 9.0  12-06-19; wbc 5.6; hgb 10.7; hct 34.2; mcv 996.9 plt 199; glucose 81; bun 17; creat 0.65; k+ 3.8; na++ 140; ca 8.7; liver normal albumin 3.0 tsh 0.491 free t3: 2.5 free t4: 0.74  NO NEW LABS.   Review of Systems  Unable to perform ROS: Dementia (unable to participate )    Physical Exam Constitutional:      General: She is not in acute distress.    Appearance: She is well-developed and well-nourished. She is not diaphoretic.     Comments: thin  Neck:     Thyroid: No thyromegaly.  Cardiovascular:     Rate and Rhythm: Normal rate and  regular rhythm.     Pulses: Normal pulses and intact distal pulses.     Heart sounds: Normal heart sounds.  Pulmonary:     Effort: Pulmonary effort is normal. No respiratory distress.     Breath sounds: Normal breath sounds.     Comments: 02 Abdominal:     General: Bowel sounds are normal. There is no distension.     Palpations: Abdomen is soft.     Tenderness: There is no abdominal tenderness.  Musculoskeletal:        General: No edema. Normal range of motion.     Cervical back: Neck supple.     Right lower leg: No edema.     Left lower leg: No edema.     Comments: Kyphosis   Lymphadenopathy:     Cervical: No cervical adenopathy.  Skin:    General: Skin is warm and dry.  Neurological:     Mental Status: She is alert. Mental status is at baseline.  Psychiatric:        Mood and Affect: Mood and affect and mood normal.        ASSESSMENT/ PLAN:  TODAY  1. Vascular dementia without behavioral disturbance 2. Aortic atherosclerosis  3. Moderate protein calorie malnutrition  Will continue current medications Will continue current plan of care Will continue to monitor her status.    MD is aware of resident's narcotic use and is in agreement with current plan of care. We will attempt to wean resident as appropriate.  [name] NP [facility]  Contact [phone] Monday through Friday 8am- 5pm  After hours call [phone]

## 2020-02-25 DIAGNOSIS — R279 Unspecified lack of coordination: Secondary | ICD-10-CM | POA: Diagnosis not present

## 2020-02-25 DIAGNOSIS — L03211 Cellulitis of face: Secondary | ICD-10-CM | POA: Insufficient documentation

## 2020-02-25 DIAGNOSIS — M6281 Muscle weakness (generalized): Secondary | ICD-10-CM | POA: Diagnosis not present

## 2020-02-25 DIAGNOSIS — Z1159 Encounter for screening for other viral diseases: Secondary | ICD-10-CM | POA: Diagnosis not present

## 2020-02-25 DIAGNOSIS — J9601 Acute respiratory failure with hypoxia: Secondary | ICD-10-CM | POA: Diagnosis not present

## 2020-02-25 DIAGNOSIS — M159 Polyosteoarthritis, unspecified: Secondary | ICD-10-CM | POA: Diagnosis not present

## 2020-02-25 DIAGNOSIS — J9611 Chronic respiratory failure with hypoxia: Secondary | ICD-10-CM | POA: Diagnosis not present

## 2020-02-28 DIAGNOSIS — Z1159 Encounter for screening for other viral diseases: Secondary | ICD-10-CM | POA: Diagnosis not present

## 2020-02-28 DIAGNOSIS — J9601 Acute respiratory failure with hypoxia: Secondary | ICD-10-CM | POA: Diagnosis not present

## 2020-03-01 DIAGNOSIS — J9601 Acute respiratory failure with hypoxia: Secondary | ICD-10-CM | POA: Diagnosis not present

## 2020-03-01 DIAGNOSIS — Z1159 Encounter for screening for other viral diseases: Secondary | ICD-10-CM | POA: Diagnosis not present

## 2020-03-03 ENCOUNTER — Non-Acute Institutional Stay (SKILLED_NURSING_FACILITY): Payer: Medicare Other | Admitting: Adult Health

## 2020-03-03 DIAGNOSIS — M8949 Other hypertrophic osteoarthropathy, multiple sites: Secondary | ICD-10-CM

## 2020-03-03 DIAGNOSIS — M810 Age-related osteoporosis without current pathological fracture: Secondary | ICD-10-CM | POA: Diagnosis not present

## 2020-03-03 DIAGNOSIS — M159 Polyosteoarthritis, unspecified: Secondary | ICD-10-CM

## 2020-03-03 DIAGNOSIS — E049 Nontoxic goiter, unspecified: Secondary | ICD-10-CM

## 2020-03-03 DIAGNOSIS — J9601 Acute respiratory failure with hypoxia: Secondary | ICD-10-CM | POA: Diagnosis not present

## 2020-03-03 DIAGNOSIS — Z1159 Encounter for screening for other viral diseases: Secondary | ICD-10-CM | POA: Diagnosis not present

## 2020-03-06 DIAGNOSIS — Z1159 Encounter for screening for other viral diseases: Secondary | ICD-10-CM | POA: Diagnosis not present

## 2020-03-06 DIAGNOSIS — J9601 Acute respiratory failure with hypoxia: Secondary | ICD-10-CM | POA: Diagnosis not present

## 2020-03-07 ENCOUNTER — Encounter: Payer: Self-pay | Admitting: Adult Health

## 2020-03-07 NOTE — Progress Notes (Signed)
Location:  [facility] Room Number: 222 Place of Service:  SNF (31)   CODE STATUS: dnr   Allergies  Allergen Reactions  . Hydrocodone-Acetaminophen Nausea And Vomiting    Chief Complaint  Patient presents with  . Medical Management of Chronic Issues         Primary osteoarthritis multiple joints    Post menopausal osteoporosis  Goiter:    HPI:  She is a 87 year old long term resident of this facility being seen for the management of her chronic illnesses: Primary osteoarthritis multiple joints    Post menopausal osteoporosis  Goiter. There are no reports of cough or shortness of breath. No reports of pain;   Past Medical History:  Diagnosis Date  . Anxiety   . Dementia (HCC)   . GERD (gastroesophageal reflux disease)   . Goiter   . Hypertension   . Osteoarthritis   . Osteoporosis   . Vitamin D insufficiency     Past Surgical History:  Procedure Laterality Date  . APPENDECTOMY    . KIDNEY STONE SURGERY    . ORIF HIP FRACTURE Right 11/20/2012   Procedure: OPEN REDUCTION INTERNAL FIXATION RIGHT HIP;  Surgeon: Darreld Mclean, MD;  Location: [facility];  Service: Orthopedics;  Laterality: Right;  . stress fractures of legs    . TUBAL LIGATION      Social History   Socioeconomic History  . Marital status: Widowed    Spouse name: Not on file  . Number of children: Not on file  . Years of education: Not on file  . Highest education level: Not on file  Occupational History  . Not on file  Tobacco Use  . Smoking status: Never Smoker  . Smokeless tobacco: Never Used  Vaping Use  . Vaping Use: Never used  Substance and Sexual Activity  . Alcohol use: No  . Drug use: No  . Sexual activity: Never  Other Topics Concern  . Not on file  Social History Narrative  . Not on file   Social Determinants of Health   Financial Resource Strain: Not on file  Food Insecurity: Not on file  Transportation Needs: Not on file  Physical Activity: Not on  file  Stress: Not on file  Social Connections: Not on file  Intimate Partner Violence: Not on file   Family History  Problem Relation Age of Onset  . Heart attack Father   . Heart disease Neg Hx       VITAL SIGNS BP 128/75   Pulse 69   Temp 97.6 F (36.4 C)   Ht 5' (1.524 m)   Wt 120 lb (54.4 kg)   BMI 23.44 kg/m   Outpatient Encounter Medications as of 03/03/2020  Medication Sig  . acetaminophen (TYLENOL) 325 MG tablet Take 650 mg by mouth in the morning, at noon, and at bedtime.  Marland Kitchen alendronate (FOSAMAX) 70 MG tablet Take 70 mg by mouth once a week. Take with a full glass of water on an empty stomach.  Marland Kitchen amoxicillin-clavulanate (AUGMENTIN) 500-125 MG tablet Take 1 tablet by mouth 2 (two) times daily.  . calcium carbonate (TUMS EX) 750 MG chewable tablet Chew 1 tablet by mouth 3 (three) times daily.  . Cholecalciferol 50 MCG (2000 UT) CAPS Take 1 capsule by mouth daily in the afternoon. Special Instructions: for supplement for osteoporosis  . dextromethorphan-guaiFENesin (ROBITUSSIN-DM) 10-100 MG/5ML liquid Take 10 mLs by mouth every 6 (six) hours as needed for cough.  Marland Kitchen  escitalopram (LEXAPRO) 5 MG tablet Take 10 mg by mouth daily.  Marland Kitchen esomeprazole (NEXIUM) 40 MG capsule Take 40 mg by mouth daily at 12 noon.  . feeding supplement (ENSURE ENLIVE / ENSURE PLUS) LIQD Take 237 mLs by mouth 2 (two) times daily between meals.  Marland Kitchen loperamide (IMODIUM) 2 MG capsule Take 2 mg by mouth every 4 (four) hours as needed for diarrhea or loose stools.  Marland Kitchen losartan (COZAAR) 100 MG tablet Take 100 mg by mouth daily.  . magnesium hydroxide (MILK OF MAGNESIA) 400 MG/5ML suspension Take 30 mLs by mouth daily as needed (For Constipation).  . mirtazapine (REMERON) 7.5 MG tablet Take 7.5 mg by mouth at bedtime.  . NON FORMULARY Mechanical soft diet with ground meat  . OXYGEN Inhale 3 L into the lungs continuous.   No facility-administered encounter medications on file as of 03/03/2020.      SIGNIFICANT DIAGNOSTIC EXAMS    PREVIOUS  02-11-19: chest x-ray:  1. Bilateral perihilar and bibasilar streaky densities concerning for developing infiltrate. Follow-up recommended. 2. Probable small bilateral pleural effusions.  02-17-19: ct of chest/angio of chest:  Patchy bilateral ground-glass opacities, most pronounced in the right upper lobe most compatible with pneumonia.     Small bilateral pleural effusions with bilateral lower lobe atelectasis or pneumonia. Cardiomegaly, coronary artery disease. Moderate-sized hiatal hernia. Aortic Atherosclerosis   02-18-19: 2-d echo:   1. Left ventricular ejection fraction, by visual estimation, is 70 to 75%. The left ventricle has hyperdynamic function. There is no left ventricular hypertrophy.  2. Left ventricular diastolic parameters are indeterminate.  3. The left ventricle has no regional wall motion abnormalities.  4. Global right ventricle was not well visualized.The right ventricular size is not well visualized. Right vetricular wall thickness was not assessed.  5. Left atrial size was not well visualized.  6. Right atrial size was not well visualized.  7. The mitral valve was not well visualized. No evidence of mitral valve regurgitation. No evidence of mitral stenosis.  8. The tricuspid valve is not well visualized.  9. The aortic valve was not well visualized. Aortic valve regurgitation is not visualized. Technically difficult assement, grossly there is no significant aortic stenosis. 10. The pulmonic valve was not well visualized. Pulmonic valve regurgitation is not visualized. 11. The interatrial septum was not well visualized. 12. Technically difficult and limited study  12-16-19: DEXA: t score -5.678   NO NEW EXAMS.    LABS REVIEWED PREVIOUS   02-11-19: wbc 4.2; hgb 12.9; hct 39.8 mcv 96.4 plt 211; glucose 112; bun 25; creat 0.83; k+ 3.1; na++ 134; ca 8.6; liver normal albumin 3.3 blood culture: no growth 02-14-19:  glucose 129; bun 16; creat 0.56; k+ 3.8; na++ 140; ca 8.6 hgb a1c 6.3 02-17-19: wbc 7.6; hgb 11.2 hct 35.9; mcv 97.6 plt 256; glucose 114; bun 15; creat 0.48; k+ 4.1; na++ 138; ca 8.7; mag 2.1; tsh 0.369 free t4: 1.18; vit B12: 251 folate 7.3 d-dimer 1.51 02-19-19: wbc 8.6; hgb 11.0; hct 34.0; mcv 94.2 plt 313; glucose 115; bun 16; creat 0.63; k+ 3.6; na++ 134; ca 8.4 mag 1.9  02-26-19: wbc 11.8; hgb 11.9; hct 37.2; mcv 97.6 plt 337; glucose 127; bun 14; creat 0.66; k+ 4.1; na++ 137; ca 8.9;  03-02-19: wbc 4.0; hgb 11.5; hct 36.8; mcv 97.6 plt 286 03-17-19: wbc 9.1; hgb 12.2; hct 39.5; mcv 98.8 plt 343; glucose 156; bun 36; creat 1.06; k+ 3.9; na++ 137; ca 9.4 liver normal albumin 3.2 + guaiac 03-21-19: +  guaiac  03-22-19: wbc 5.1; hgb 11.0; hct 36.0; mcv 99.7 plt 265 03-25-19: guaiac neg X 3  04-22-19: tsh 0.317 free T3: 2.9 free T4; 0.98 06-03-19: wbc 6.3; hgb 10.8; hct 34.2; mcv 93.7 plt 185; glucose 117; bun 21; creat 0.67; k+ 3.7; na++ 140; ca 9.0  12-06-19; wbc 5.6; hgb 10.7; hct 34.2; mcv 996.9 plt 199; glucose 81; bun 17; creat 0.65; k+ 3.8; na++ 140; ca 8.7; liver normal albumin 3.0 tsh 0.491 free t3: 2.5 free t4: 0.74  NO NEW LABS.   Review of Systems  Unable to perform ROS: Dementia (unable to participate )   Physical Exam Constitutional:      General: She is not in acute distress.    Appearance: She is well-developed and well-nourished. She is not diaphoretic.     Comments: thin  Neck:     Thyroid: No thyromegaly.  Cardiovascular:     Rate and Rhythm: Normal rate and regular rhythm.     Pulses: Normal pulses and intact distal pulses.     Heart sounds: Normal heart sounds.  Pulmonary:     Effort: Pulmonary effort is normal. No respiratory distress.     Breath sounds: Normal breath sounds.     Comments: 02 Abdominal:     General: Bowel sounds are normal. There is no distension.     Palpations: Abdomen is soft.     Tenderness: There is no abdominal tenderness.  Musculoskeletal:         General: No edema. Normal range of motion.     Cervical back: Neck supple.     Right lower leg: No edema.     Left lower leg: No edema.     Comments: Kyphosis   Lymphadenopathy:     Cervical: No cervical adenopathy.  Skin:    General: Skin is warm and dry.  Neurological:     Mental Status: She is alert. Mental status is at baseline.  Psychiatric:        Mood and Affect: Mood and affect and mood normal.      ASSESSMENT/ PLAN:  TODAY  1. Primary osteoarthritis multiple joints is stable will continue tylenol 1 gm three times daily   2. Post menopausal osteoporosis is stable t score: -5.678 will continue fosamax 70 mg daily   3. Goiter: is stable tsh 03941 will monitor    PREVIOUS   4. Essential hypertension: is stable b/p 128/75 will continue cozaar 100 mg daily   5. Protein calorie malnutrition: is without change albumin 3.0 will continue supplements as directed.   6. Aortic atherosclerosis: is without change will monitor   7. Major depression chronic: is stable will continue lexapro 10 mg daily is tolerating wean.   8. GERD without esophagitis: is stable has history of GI bleed; will continue nexium 40 mg twice daily   9. Vascular dementia without behavioral disturbance weight is stable at 120 pounds; is off aricept; remains on remeron 7.5 mg to maintain appetite; (has failed one wean)     MD is aware of resident's narcotic use and is in agreement with current plan of care. We will attempt to wean resident as appropriate.  [name] NP [facility]  Contact [phone] Monday through Friday 8am- 5pm  After hours call [phone]

## 2020-03-08 DIAGNOSIS — Z1159 Encounter for screening for other viral diseases: Secondary | ICD-10-CM | POA: Diagnosis not present

## 2020-03-08 DIAGNOSIS — J9601 Acute respiratory failure with hypoxia: Secondary | ICD-10-CM | POA: Diagnosis not present

## 2020-03-10 DIAGNOSIS — J9601 Acute respiratory failure with hypoxia: Secondary | ICD-10-CM | POA: Diagnosis not present

## 2020-03-10 DIAGNOSIS — Z1159 Encounter for screening for other viral diseases: Secondary | ICD-10-CM | POA: Diagnosis not present

## 2020-03-13 DIAGNOSIS — J9601 Acute respiratory failure with hypoxia: Secondary | ICD-10-CM | POA: Diagnosis not present

## 2020-03-13 DIAGNOSIS — Z1159 Encounter for screening for other viral diseases: Secondary | ICD-10-CM | POA: Diagnosis not present

## 2020-03-15 DIAGNOSIS — J9601 Acute respiratory failure with hypoxia: Secondary | ICD-10-CM | POA: Diagnosis not present

## 2020-03-15 DIAGNOSIS — Z1159 Encounter for screening for other viral diseases: Secondary | ICD-10-CM | POA: Diagnosis not present

## 2020-03-17 DIAGNOSIS — J9601 Acute respiratory failure with hypoxia: Secondary | ICD-10-CM | POA: Diagnosis not present

## 2020-03-17 DIAGNOSIS — Z1159 Encounter for screening for other viral diseases: Secondary | ICD-10-CM | POA: Diagnosis not present

## 2020-03-20 DIAGNOSIS — J9601 Acute respiratory failure with hypoxia: Secondary | ICD-10-CM | POA: Diagnosis not present

## 2020-03-20 DIAGNOSIS — L602 Onychogryphosis: Secondary | ICD-10-CM | POA: Diagnosis not present

## 2020-03-20 DIAGNOSIS — I739 Peripheral vascular disease, unspecified: Secondary | ICD-10-CM | POA: Diagnosis not present

## 2020-03-20 DIAGNOSIS — Z1159 Encounter for screening for other viral diseases: Secondary | ICD-10-CM | POA: Diagnosis not present

## 2020-03-22 DIAGNOSIS — Z1159 Encounter for screening for other viral diseases: Secondary | ICD-10-CM | POA: Diagnosis not present

## 2020-03-22 DIAGNOSIS — J9601 Acute respiratory failure with hypoxia: Secondary | ICD-10-CM | POA: Diagnosis not present

## 2020-03-29 ENCOUNTER — Non-Acute Institutional Stay (SKILLED_NURSING_FACILITY): Payer: Medicare Other | Admitting: Adult Health

## 2020-03-29 ENCOUNTER — Encounter: Payer: Self-pay | Admitting: Adult Health

## 2020-03-29 DIAGNOSIS — I10 Essential (primary) hypertension: Secondary | ICD-10-CM | POA: Diagnosis not present

## 2020-03-29 DIAGNOSIS — E440 Moderate protein-calorie malnutrition: Secondary | ICD-10-CM

## 2020-03-29 DIAGNOSIS — I700 Atherosclerosis of aorta: Secondary | ICD-10-CM

## 2020-03-29 NOTE — Progress Notes (Signed)
Location:  [facility] Room Number: 122/W Place of Service:  SNF (31)   CODE STATUS:DNR  Allergies  Allergen Reactions  . Hydrocodone-Acetaminophen Nausea And Vomiting    Chief Complaint  Patient presents with  . Medical Management of Chronic Issues        Essential hypertension:   Protein calorie malnutrition:  Aortic atherosclerosis     HPI:  She is a 87 year old long term resident of this facility being seen for the management of her chronic illnesses: Essential hypertension:   Protein calorie malnutrition:  Aortic atherosclerosis.  She does spend all of her time in her room per her choice. There are no reports of coughing; no shortness of breath; no uncontrolled pain.   Past Medical History:  Diagnosis Date  . Anxiety   . Dementia (HCC)   . GERD (gastroesophageal reflux disease)   . Goiter   . Hypertension   . Osteoarthritis   . Osteoporosis   . Vitamin D insufficiency     Past Surgical History:  Procedure Laterality Date  . APPENDECTOMY    . KIDNEY STONE SURGERY    . ORIF HIP FRACTURE Right 11/20/2012   Procedure: OPEN REDUCTION INTERNAL FIXATION RIGHT HIP;  Surgeon: Darreld Mclean, MD;  Location: [facility];  Service: Orthopedics;  Laterality: Right;  . stress fractures of legs    . TUBAL LIGATION      Social History   Socioeconomic History  . Marital status: Widowed    Spouse name: Not on file  . Number of children: Not on file  . Years of education: Not on file  . Highest education level: Not on file  Occupational History  . Not on file  Tobacco Use  . Smoking status: Never Smoker  . Smokeless tobacco: Never Used  Vaping Use  . Vaping Use: Never used  Substance and Sexual Activity  . Alcohol use: No  . Drug use: No  . Sexual activity: Never  Other Topics Concern  . Not on file  Social History Narrative  . Not on file   Social Determinants of Health   Financial Resource Strain: Not on file  Food Insecurity: Not on file   Transportation Needs: Not on file  Physical Activity: Not on file  Stress: Not on file  Social Connections: Not on file  Intimate Partner Violence: Not on file   Family History  Problem Relation Age of Onset  . Heart attack Father   . Heart disease Neg Hx       VITAL SIGNS BP 110/62   Pulse 64   Temp 97.8 F (36.6 C)   Ht 5' (1.524 m)   Wt 130 lb 9.6 oz (59.2 kg)   SpO2 93%   BMI 25.51 kg/m   Outpatient Encounter Medications as of 03/29/2020  Medication Sig  . acetaminophen (TYLENOL) 325 MG tablet Take 650 mg by mouth in the morning, at noon, and at bedtime.  Marland Kitchen alendronate (FOSAMAX) 70 MG tablet Take 70 mg by mouth once a week. Take with a full glass of water on an empty stomach.  . calcium carbonate (TUMS EX) 750 MG chewable tablet Chew 1 tablet by mouth 3 (three) times daily.  . Cholecalciferol 50 MCG (2000 UT) CAPS Take 1 capsule by mouth daily in the afternoon. Special Instructions: for supplement for osteoporosis  . dextromethorphan-guaiFENesin (ROBITUSSIN-DM) 10-100 MG/5ML liquid Take 10 mLs by mouth every 6 (six) hours as needed for cough.  . escitalopram (LEXAPRO) 5 MG  tablet Take 10 mg by mouth daily.  Marland Kitchen esomeprazole (NEXIUM) 40 MG capsule Take 40 mg by mouth daily at 12 noon.  . feeding supplement (ENSURE ENLIVE / ENSURE PLUS) LIQD Take 237 mLs by mouth 2 (two) times daily between meals.  Marland Kitchen loperamide (IMODIUM) 2 MG capsule Take 2 mg by mouth every 4 (four) hours as needed for diarrhea or loose stools.  Marland Kitchen losartan (COZAAR) 100 MG tablet Take 100 mg by mouth daily.  . magnesium hydroxide (MILK OF MAGNESIA) 400 MG/5ML suspension Take 30 mLs by mouth daily as needed (For Constipation).  . mirtazapine (REMERON) 7.5 MG tablet Take 7.5 mg by mouth at bedtime.  . NON FORMULARY Mechanical soft diet with ground meat  . OXYGEN Inhale 3 L into the lungs continuous.  . [DISCONTINUED] amoxicillin-clavulanate (AUGMENTIN) 500-125 MG tablet Take 1 tablet by mouth 2 (two) times  daily.   No facility-administered encounter medications on file as of 03/29/2020.     SIGNIFICANT DIAGNOSTIC EXAMS  PREVIOUS  02-11-19: chest x-ray:  1. Bilateral perihilar and bibasilar streaky densities concerning for developing infiltrate. Follow-up recommended. 2. Probable small bilateral pleural effusions.  02-17-19: ct of chest/angio of chest:  Patchy bilateral ground-glass opacities, most pronounced in the right upper lobe most compatible with pneumonia.     Small bilateral pleural effusions with bilateral lower lobe atelectasis or pneumonia. Cardiomegaly, coronary artery disease. Moderate-sized hiatal hernia. Aortic Atherosclerosis   02-18-19: 2-d echo:   1. Left ventricular ejection fraction, by visual estimation, is 70 to 75%. The left ventricle has hyperdynamic function. There is no left ventricular hypertrophy.  2. Left ventricular diastolic parameters are indeterminate.  3. The left ventricle has no regional wall motion abnormalities.  4. Global right ventricle was not well visualized.The right ventricular size is not well visualized. Right vetricular wall thickness was not assessed.  5. Left atrial size was not well visualized.  6. Right atrial size was not well visualized.  7. The mitral valve was not well visualized. No evidence of mitral valve regurgitation. No evidence of mitral stenosis.  8. The tricuspid valve is not well visualized.  9. The aortic valve was not well visualized. Aortic valve regurgitation is not visualized. Technically difficult assement, grossly there is no significant aortic stenosis. 10. The pulmonic valve was not well visualized. Pulmonic valve regurgitation is not visualized. 11. The interatrial septum was not well visualized. 12. Technically difficult and limited study  12-16-19: DEXA: t score -5.678   NO NEW EXAMS.    LABS REVIEWED PREVIOUS   03-17-19: wbc 9.1; hgb 12.2; hct 39.5; mcv 98.8 plt 343; glucose 156; bun 36; creat 1.06; k+ 3.9;  na++ 137; ca 9.4 liver normal albumin 3.2 + guaiac 03-21-19: + guaiac  03-22-19: wbc 5.1; hgb 11.0; hct 36.0; mcv 99.7 plt 265 03-25-19: guaiac neg X 3  04-22-19: tsh 0.317 free T3: 2.9 free T4; 0.98 06-03-19: wbc 6.3; hgb 10.8; hct 34.2; mcv 93.7 plt 185; glucose 117; bun 21; creat 0.67; k+ 3.7; na++ 140; ca 9.0  12-06-19; wbc 5.6; hgb 10.7; hct 34.2; mcv 996.9 plt 199; glucose 81; bun 17; creat 0.65; k+ 3.8; na++ 140; ca 8.7; liver normal albumin 3.0 tsh 0.491 free t3: 2.5 free t4: 0.74  NO NEW LABS.   Review of Systems  Unable to perform ROS: Dementia (unable to participate )   Physical Exam Constitutional:      General: She is not in acute distress.    Appearance: She is underweight and well-nourished. She  is not diaphoretic.  Neck:     Thyroid: No thyromegaly.  Cardiovascular:     Rate and Rhythm: Normal rate and regular rhythm.     Pulses: Normal pulses and intact distal pulses.     Heart sounds: Normal heart sounds.  Pulmonary:     Effort: Pulmonary effort is normal. No respiratory distress.     Breath sounds: Normal breath sounds.     Comments: 02 Abdominal:     General: Bowel sounds are normal. There is no distension.     Palpations: Abdomen is soft.     Tenderness: There is no abdominal tenderness.  Musculoskeletal:        General: No edema. Normal range of motion.     Cervical back: Neck supple.     Right lower leg: No edema.     Left lower leg: No edema.     Comments: Kyphosis   Lymphadenopathy:     Cervical: No cervical adenopathy.  Skin:    General: Skin is warm and dry.  Neurological:     Mental Status: She is alert. Mental status is at baseline.  Psychiatric:        Mood and Affect: Mood and affect and mood normal.    ASSESSMENT/ PLAN:  TODAY   1. Essential hypertension: is stable b/p 110/62 will continue cozaar 100 mg daily   2. Protein calorie malnutrition: is stable albumin 3.0 will continue supplements as directed  3. Aortic atherosclerosis: (ct  02-17-19) is stable will monitor    PREVIOUS   4. Major depression chronic: is stable will continue lexapro 10 mg daily is tolerating wean.   5. GERD without esophagitis: is stable has history of GI bleed; will continue nexium 40 mg twice daily   6. Vascular dementia without behavioral disturbance weight is stable at 120 pounds; is off aricept; remains on remeron 7.5 mg to maintain appetite; (has failed one wean)   7. Primary osteoarthritis multiple joints is stable will continue tylenol 1 gm three times daily   8. Post menopausal osteoporosis is stable t score: -5.678 will continue fosamax 70 mg daily   9. Goiter: is stable tsh 03.941 will monitor      MD is aware of resident's narcotic use and is in agreement with current plan of care. We will attempt to wean resident as appropriate.  [name] NP [facility]  Contact [phone] Monday through Friday 8am- 5pm  After hours call [phone]

## 2020-04-05 DIAGNOSIS — H43813 Vitreous degeneration, bilateral: Secondary | ICD-10-CM | POA: Diagnosis not present

## 2020-04-05 DIAGNOSIS — H2513 Age-related nuclear cataract, bilateral: Secondary | ICD-10-CM | POA: Diagnosis not present

## 2020-04-05 DIAGNOSIS — H524 Presbyopia: Secondary | ICD-10-CM | POA: Diagnosis not present

## 2020-05-03 ENCOUNTER — Non-Acute Institutional Stay (SKILLED_NURSING_FACILITY): Payer: Medicare Other | Admitting: Adult Health

## 2020-05-03 ENCOUNTER — Encounter: Payer: Self-pay | Admitting: Adult Health

## 2020-05-03 DIAGNOSIS — K219 Gastro-esophageal reflux disease without esophagitis: Secondary | ICD-10-CM

## 2020-05-03 DIAGNOSIS — F0150 Vascular dementia without behavioral disturbance: Secondary | ICD-10-CM

## 2020-05-03 DIAGNOSIS — F339 Major depressive disorder, recurrent, unspecified: Secondary | ICD-10-CM

## 2020-05-03 NOTE — Progress Notes (Signed)
Location:  [facility] Room Number: 122/W Place of Service:  SNF (31)   CODE STATUS: DNR  Allergies  Allergen Reactions  . Hydrocodone-Acetaminophen Nausea And Vomiting    Chief Complaint  Patient presents with  . Medical Management of Chronic Issues            Major depression chronic:   GERD without esophagitis: Vascular dementia without behavioral disturbance      HPI:  She is a 87 year old long term resident of this facility being seen for the management of her chronic illnesses: Major depression chronic:   GERD without esophagitis: Vascular dementia without behavioral disturbance.there are no reports of anxiety or agitation. No reports of heart burn present;no reports of uncontrolled pain.   Past Medical History:  Diagnosis Date  . Anxiety   . Dementia (HCC)   . GERD (gastroesophageal reflux disease)   . Goiter   . Hypertension   . Osteoarthritis   . Osteoporosis   . Vitamin D insufficiency     Past Surgical History:  Procedure Laterality Date  . APPENDECTOMY    . KIDNEY STONE SURGERY    . ORIF HIP FRACTURE Right 11/20/2012   Procedure: OPEN REDUCTION INTERNAL FIXATION RIGHT HIP;  Surgeon: Darreld Mclean, MD;  Location: [facility];  Service: Orthopedics;  Laterality: Right;  . stress fractures of legs    . TUBAL LIGATION      Social History   Socioeconomic History  . Marital status: Widowed    Spouse name: Not on file  . Number of children: Not on file  . Years of education: Not on file  . Highest education level: Not on file  Occupational History  . Not on file  Tobacco Use  . Smoking status: Never Smoker  . Smokeless tobacco: Never Used  Vaping Use  . Vaping Use: Never used  Substance and Sexual Activity  . Alcohol use: No  . Drug use: No  . Sexual activity: Never  Other Topics Concern  . Not on file  Social History Narrative  . Not on file   Social Determinants of Health   Financial Resource Strain: Not on file  Food  Insecurity: Not on file  Transportation Needs: Not on file  Physical Activity: Not on file  Stress: Not on file  Social Connections: Not on file  Intimate Partner Violence: Not on file   Family History  Problem Relation Age of Onset  . Heart attack Father   . Heart disease Neg Hx       VITAL SIGNS BP 124/66   Pulse 97   Temp 98 F (36.7 C)   Ht 5' (1.524 m)   Wt 120 lb (54.4 kg)   SpO2 96%   BMI 23.44 kg/m   Outpatient Encounter Medications as of 05/03/2020  Medication Sig  . acetaminophen (TYLENOL) 325 MG tablet Take 650 mg by mouth in the morning, at noon, and at bedtime.  Marland Kitchen alendronate (FOSAMAX) 70 MG tablet Take 70 mg by mouth once a week. Take with a full glass of water on an empty stomach.  . calcium carbonate (TUMS EX) 750 MG chewable tablet Chew 1 tablet by mouth 3 (three) times daily.  . Cholecalciferol 50 MCG (2000 UT) CAPS Take 1 capsule by mouth daily in the afternoon. Special Instructions: for supplement for osteoporosis  . dextromethorphan-guaiFENesin (ROBITUSSIN-DM) 10-100 MG/5ML liquid Take 10 mLs by mouth every 6 (six) hours as needed for cough.  . escitalopram (LEXAPRO) 5 MG  tablet Take 10 mg by mouth daily.  Marland Kitchen esomeprazole (NEXIUM) 40 MG capsule Take 40 mg by mouth daily at 12 noon.  . feeding supplement (ENSURE ENLIVE / ENSURE PLUS) LIQD Take 237 mLs by mouth 2 (two) times daily between meals.  Marland Kitchen loperamide (IMODIUM) 2 MG capsule Take 2 mg by mouth every 4 (four) hours as needed for diarrhea or loose stools.  Marland Kitchen losartan (COZAAR) 100 MG tablet Take 100 mg by mouth daily.  . mirtazapine (REMERON) 7.5 MG tablet Take 7.5 mg by mouth at bedtime.  . NON FORMULARY Mechanical soft diet with ground meat  . OXYGEN Inhale 3 L into the lungs continuous.  . [DISCONTINUED] magnesium hydroxide (MILK OF MAGNESIA) 400 MG/5ML suspension Take 30 mLs by mouth daily as needed (For Constipation).   No facility-administered encounter medications on file as of 05/03/2020.      SIGNIFICANT DIAGNOSTIC EXAMS   PREVIOUS  02-17-19: ct of chest/angio of chest:  Patchy bilateral ground-glass opacities, most pronounced in the right upper lobe most compatible with pneumonia.     Small bilateral pleural effusions with bilateral lower lobe atelectasis or pneumonia. Cardiomegaly, coronary artery disease. Moderate-sized hiatal hernia. Aortic Atherosclerosis   02-18-19: 2-d echo:   1. Left ventricular ejection fraction, by visual estimation, is 70 to 75%. The left ventricle has hyperdynamic function. There is no left ventricular hypertrophy.  2. Left ventricular diastolic parameters are indeterminate.  3. The left ventricle has no regional wall motion abnormalities.  4. Global right ventricle was not well visualized.The right ventricular size is not well visualized. Right vetricular wall thickness was not assessed.  5. Left atrial size was not well visualized.  6. Right atrial size was not well visualized.  7. The mitral valve was not well visualized. No evidence of mitral valve regurgitation. No evidence of mitral stenosis.  8. The tricuspid valve is not well visualized.  9. The aortic valve was not well visualized. Aortic valve regurgitation is not visualized. Technically difficult assement, grossly there is no significant aortic stenosis. 10. The pulmonic valve was not well visualized. Pulmonic valve regurgitation is not visualized. 11. The interatrial septum was not well visualized. 12. Technically difficult and limited study  12-16-19: DEXA: t score -5.678   NO NEW EXAMS.    LABS REVIEWED PREVIOUS   04-22-19: tsh 0.317 free T3: 2.9 free T4; 0.98 06-03-19: wbc 6.3; hgb 10.8; hct 34.2; mcv 93.7 plt 185; glucose 117; bun 21; creat 0.67; k+ 3.7; na++ 140; ca 9.0  12-06-19; wbc 5.6; hgb 10.7; hct 34.2; mcv 996.9 plt 199; glucose 81; bun 17; creat 0.65; k+ 3.8; na++ 140; ca 8.7; liver normal albumin 3.0 tsh 0.491 free t3: 2.5 free t4: 0.74  NO NEW LABS.   Review  of Systems  Unable to perform ROS: Dementia (unable to participate )    Physical Exam Constitutional:      General: She is not in acute distress.    Appearance: She is well-developed. She is not diaphoretic.  Neck:     Thyroid: No thyromegaly.  Cardiovascular:     Rate and Rhythm: Normal rate and regular rhythm.     Pulses: Normal pulses.     Heart sounds: Normal heart sounds.  Pulmonary:     Effort: Pulmonary effort is normal. No respiratory distress.     Breath sounds: Normal breath sounds.     Comments: 02 Abdominal:     General: Bowel sounds are normal. There is no distension.     Palpations:  Abdomen is soft.     Tenderness: There is no abdominal tenderness.  Musculoskeletal:        General: Normal range of motion.     Cervical back: Neck supple.     Right lower leg: No edema.     Left lower leg: No edema.     Comments: Kyphosis   Lymphadenopathy:     Cervical: No cervical adenopathy.  Skin:    General: Skin is warm and dry.  Neurological:     Mental Status: She is alert. Mental status is at baseline.  Psychiatric:        Mood and Affect: Mood normal.      ASSESSMENT/ PLAN:  TODAY   1. Major depression chronic: is stable will continue tolerating lexapro 10 mg daily will monitor   2. GERD without esophagitis: is stable has history of GI Bleed: will continue nexium 40 mg daily   3. Vascular dementia without behavioral disturbance weight is stable at 120 pounds is off aricept; she does continue remeron 7.5 mg nightly to help maintain appetite.   PREVIOUS   4. Primary osteoarthritis multiple joints is stable will continue tylenol 1 gm three times daily   5. Post menopausal osteoporosis is stable t score: -5.678 will continue fosamax 70 mg daily   6. Goiter: is stable tsh 03.941 will monitor  7. Essential hypertension: is stable b/p 122/64 will continue cozaar 100 mg daily   8. Protein calorie malnutrition: is stable albumin 3.0 will continue supplements as  directed  9. Aortic atherosclerosis: (ct 02-17-19) is stable will monitor     [name] NP [facility]  Contact [phone] Monday through Friday 8am- 5pm  After hours call [phone]

## 2020-05-04 ENCOUNTER — Encounter: Payer: Self-pay | Admitting: Adult Health

## 2020-05-04 ENCOUNTER — Non-Acute Institutional Stay (SKILLED_NURSING_FACILITY): Payer: Medicare Other | Admitting: Adult Health

## 2020-05-04 DIAGNOSIS — F0150 Vascular dementia without behavioral disturbance: Secondary | ICD-10-CM

## 2020-05-04 DIAGNOSIS — J9611 Chronic respiratory failure with hypoxia: Secondary | ICD-10-CM | POA: Insufficient documentation

## 2020-05-04 DIAGNOSIS — I700 Atherosclerosis of aorta: Secondary | ICD-10-CM | POA: Diagnosis not present

## 2020-05-04 NOTE — Progress Notes (Signed)
Location:  [facility] Room Number: 222 Place of Service:  SNF (31)   CODE STATUS: dnr  Allergies  Allergen Reactions  . Hydrocodone-Acetaminophen Nausea And Vomiting    Chief Complaint  Patient presents with  . Acute Visit    Care plan meeting     HPI:  We have come together for her care plan meeting. Unable to do BIMS; mood 3/30. She remains extensive assist to dependent with her adls. She is able to feed herself. She is incontinent of bladder and bowel. No recent falls. Is on chronic 02. Therapy none   Weight her weight is stable.   She continues to be followed for her chronic illnesses including:  Chronic respiratory failure with hypoxia  Aortic atherosclerosis  vascular dementia without behavioral disturbance  Past Medical History:  Diagnosis Date  . Anxiety   . Dementia (HCC)   . GERD (gastroesophageal reflux disease)   . Goiter   . Hypertension   . Osteoarthritis   . Osteoporosis   . Vitamin D insufficiency     Past Surgical History:  Procedure Laterality Date  . APPENDECTOMY    . KIDNEY STONE SURGERY    . ORIF HIP FRACTURE Right 11/20/2012   Procedure: OPEN REDUCTION INTERNAL FIXATION RIGHT HIP;  Surgeon: Darreld Mclean, MD;  Location: [facility];  Service: Orthopedics;  Laterality: Right;  . stress fractures of legs    . TUBAL LIGATION      Social History   Socioeconomic History  . Marital status: Widowed    Spouse name: Not on file  . Number of children: Not on file  . Years of education: Not on file  . Highest education level: Not on file  Occupational History  . Not on file  Tobacco Use  . Smoking status: Never Smoker  . Smokeless tobacco: Never Used  Vaping Use  . Vaping Use: Never used  Substance and Sexual Activity  . Alcohol use: No  . Drug use: No  . Sexual activity: Never  Other Topics Concern  . Not on file  Social History Narrative  . Not on file   Social Determinants of Health   Financial Resource Strain:  Not on file  Food Insecurity: Not on file  Transportation Needs: Not on file  Physical Activity: Not on file  Stress: Not on file  Social Connections: Not on file  Intimate Partner Violence: Not on file   Family History  Problem Relation Age of Onset  . Heart attack Father   . Heart disease Neg Hx       VITAL SIGNS BP 124/66   Pulse 68   Temp 98.2 F (36.8 C)   Resp 16   Ht 5' (1.524 m)   Wt 120 lb (54.4 kg)   SpO2 98%   BMI 23.44 kg/m   Outpatient Encounter Medications as of 05/04/2020  Medication Sig  . acetaminophen (TYLENOL) 325 MG tablet Take 650 mg by mouth in the morning, at noon, and at bedtime.  Marland Kitchen alendronate (FOSAMAX) 70 MG tablet Take 70 mg by mouth once a week. Take with a full glass of water on an empty stomach.  . calcium carbonate (TUMS EX) 750 MG chewable tablet Chew 1 tablet by mouth 3 (three) times daily.  . Cholecalciferol 50 MCG (2000 UT) CAPS Take 1 capsule by mouth daily in the afternoon. Special Instructions: for supplement for osteoporosis  . dextromethorphan-guaiFENesin (ROBITUSSIN-DM) 10-100 MG/5ML liquid Take 10 mLs by mouth every 6 (six)  hours as needed for cough.  . escitalopram (LEXAPRO) 5 MG tablet Take 10 mg by mouth daily.  Marland Kitchen esomeprazole (NEXIUM) 40 MG capsule Take 40 mg by mouth daily at 12 noon.  . feeding supplement (ENSURE ENLIVE / ENSURE PLUS) LIQD Take 237 mLs by mouth 2 (two) times daily between meals.  Marland Kitchen loperamide (IMODIUM) 2 MG capsule Take 2 mg by mouth every 4 (four) hours as needed for diarrhea or loose stools.  Marland Kitchen losartan (COZAAR) 100 MG tablet Take 100 mg by mouth daily.  . mirtazapine (REMERON) 7.5 MG tablet Take 7.5 mg by mouth at bedtime.  . NON FORMULARY Mechanical soft diet with ground meat  . OXYGEN Inhale 3 L into the lungs continuous.   No facility-administered encounter medications on file as of 05/04/2020.     SIGNIFICANT DIAGNOSTIC EXAMS  PREVIOUS  02-11-19: chest x-ray:  1. Bilateral perihilar and bibasilar  streaky densities concerning for developing infiltrate. Follow-up recommended. 2. Probable small bilateral pleural effusions.  02-17-19: ct of chest/angio of chest:  Patchy bilateral ground-glass opacities, most pronounced in the right upper lobe most compatible with pneumonia.     Small bilateral pleural effusions with bilateral lower lobe atelectasis or pneumonia. Cardiomegaly, coronary artery disease. Moderate-sized hiatal hernia. Aortic Atherosclerosis   02-18-19: 2-d echo:   1. Left ventricular ejection fraction, by visual estimation, is 70 to 75%. The left ventricle has hyperdynamic function. There is no left ventricular hypertrophy.  2. Left ventricular diastolic parameters are indeterminate.  3. The left ventricle has no regional wall motion abnormalities.  4. Global right ventricle was not well visualized.The right ventricular size is not well visualized. Right vetricular wall thickness was not assessed.  5. Left atrial size was not well visualized.  6. Right atrial size was not well visualized.  7. The mitral valve was not well visualized. No evidence of mitral valve regurgitation. No evidence of mitral stenosis.  8. The tricuspid valve is not well visualized.  9. The aortic valve was not well visualized. Aortic valve regurgitation is not visualized. Technically difficult assement, grossly there is no significant aortic stenosis. 10. The pulmonic valve was not well visualized. Pulmonic valve regurgitation is not visualized. 11. The interatrial septum was not well visualized. 12. Technically difficult and limited study  12-16-19: DEXA: t score -5.678   NO NEW EXAMS.    LABS REVIEWED PREVIOUS   06-03-19: wbc 6.3; hgb 10.8; hct 34.2; mcv 93.7 plt 185; glucose 117; bun 21; creat 0.67; k+ 3.7; na++ 140; ca 9.0  12-06-19; wbc 5.6; hgb 10.7; hct 34.2; mcv 996.9 plt 199; glucose 81; bun 17; creat 0.65; k+ 3.8; na++ 140; ca 8.7; liver normal albumin 3.0 tsh 0.491 free t3: 2.5 free t4:  0.74  NO NEW LABS.    Review of Systems  Unable to perform ROS: Dementia (unable to participate )   Physical Exam Constitutional:      General: She is not in acute distress.    Appearance: She is underweight. She is not diaphoretic.  Neck:     Thyroid: No thyromegaly.  Cardiovascular:     Rate and Rhythm: Normal rate and regular rhythm.     Pulses: Normal pulses.     Heart sounds: Normal heart sounds.  Pulmonary:     Effort: Pulmonary effort is normal. No respiratory distress.     Breath sounds: Normal breath sounds.     Comments: 02 Abdominal:     General: Bowel sounds are normal. There is no distension.  Palpations: Abdomen is soft.     Tenderness: There is no abdominal tenderness.  Musculoskeletal:        General: Normal range of motion.     Cervical back: Neck supple.     Right lower leg: No edema.     Left lower leg: No edema.     Comments: Kyphosis   Lymphadenopathy:     Cervical: No cervical adenopathy.  Skin:    General: Skin is warm and dry.  Neurological:     Mental Status: She is alert. Mental status is at baseline.  Psychiatric:        Mood and Affect: Mood normal.       ASSESSMENT/ PLAN:  TODAY  1. Chronic respiratory failure with hypoxia 2. Aortic atherosclerosis 3, vascular dementia without behavioral disturbance  Will continue current medications Will continue current plan of care Will continue to monitor her status  Time spent with patient 40 minutes: >50% spent with coordination: to include nursing care and medications.   [name] NP [facility]  Contact [phone] Monday through Friday 8am- 5pm  After hours call [phone]

## 2020-05-17 DIAGNOSIS — Z23 Encounter for immunization: Secondary | ICD-10-CM | POA: Diagnosis not present

## 2020-05-30 DIAGNOSIS — I739 Peripheral vascular disease, unspecified: Secondary | ICD-10-CM | POA: Diagnosis not present

## 2020-05-30 DIAGNOSIS — B351 Tinea unguium: Secondary | ICD-10-CM | POA: Diagnosis not present

## 2020-05-30 DIAGNOSIS — M2042 Other hammer toe(s) (acquired), left foot: Secondary | ICD-10-CM | POA: Diagnosis not present

## 2020-05-30 DIAGNOSIS — L603 Nail dystrophy: Secondary | ICD-10-CM | POA: Diagnosis not present

## 2020-05-30 DIAGNOSIS — M2041 Other hammer toe(s) (acquired), right foot: Secondary | ICD-10-CM | POA: Diagnosis not present

## 2020-05-31 ENCOUNTER — Encounter: Payer: Self-pay | Admitting: Adult Health

## 2020-05-31 ENCOUNTER — Non-Acute Institutional Stay (SKILLED_NURSING_FACILITY): Payer: Medicare Other | Admitting: Adult Health

## 2020-05-31 DIAGNOSIS — E049 Nontoxic goiter, unspecified: Secondary | ICD-10-CM | POA: Diagnosis not present

## 2020-05-31 DIAGNOSIS — M159 Polyosteoarthritis, unspecified: Secondary | ICD-10-CM

## 2020-05-31 DIAGNOSIS — M810 Age-related osteoporosis without current pathological fracture: Secondary | ICD-10-CM | POA: Diagnosis not present

## 2020-05-31 DIAGNOSIS — M8949 Other hypertrophic osteoarthropathy, multiple sites: Secondary | ICD-10-CM | POA: Diagnosis not present

## 2020-05-31 NOTE — Progress Notes (Signed)
Location:  [facility] Room Number: 122/W Place of Service:  SNF (31)   CODE STATUS: DNR  Allergies  Allergen Reactions  . Hydrocodone-Acetaminophen Nausea And Vomiting    Chief Complaint  Patient presents with  . Medical Management of Chronic Issues           Primary osteoarthritis multiple joints:   Post menopausal osteoporosis  Goiter     HPI:  She is a 87 year old long term resident of this facility being seen for the management of her chronic illnesses: Primary osteoarthritis multiple joints:   Post menopausal osteoporosis  Goiter.  There are no reports of shortness of breath; no reports of pain; no reports of insomnia.   Past Medical History:  Diagnosis Date  . Anxiety   . Dementia (HCC)   . GERD (gastroesophageal reflux disease)   . Goiter   . Hypertension   . Osteoarthritis   . Osteoporosis   . Vitamin D insufficiency     Past Surgical History:  Procedure Laterality Date  . APPENDECTOMY    . KIDNEY STONE SURGERY    . ORIF HIP FRACTURE Right 11/20/2012   Procedure: OPEN REDUCTION INTERNAL FIXATION RIGHT HIP;  Surgeon: Darreld Mclean, MD;  Location: [facility];  Service: Orthopedics;  Laterality: Right;  . stress fractures of legs    . TUBAL LIGATION      Social History   Socioeconomic History  . Marital status: Widowed    Spouse name: Not on file  . Number of children: Not on file  . Years of education: Not on file  . Highest education level: Not on file  Occupational History  . Not on file  Tobacco Use  . Smoking status: Never Smoker  . Smokeless tobacco: Never Used  Vaping Use  . Vaping Use: Never used  Substance and Sexual Activity  . Alcohol use: No  . Drug use: No  . Sexual activity: Never  Other Topics Concern  . Not on file  Social History Narrative  . Not on file   Social Determinants of Health   Financial Resource Strain: Not on file  Food Insecurity: Not on file  Transportation Needs: Not on file  Physical  Activity: Not on file  Stress: Not on file  Social Connections: Not on file  Intimate Partner Violence: Not on file   Family History  Problem Relation Age of Onset  . Heart attack Father   . Heart disease Neg Hx       VITAL SIGNS BP 140/74   Pulse 63   Temp 97.7 F (36.5 C)   Ht 5' (1.524 m)   Wt 123 lb (55.8 kg)   SpO2 94%   BMI 24.02 kg/m   Outpatient Encounter Medications as of 05/31/2020  Medication Sig  . acetaminophen (TYLENOL) 325 MG tablet Take 650 mg by mouth in the morning, at noon, and at bedtime.  Marland Kitchen alendronate (FOSAMAX) 70 MG tablet Take 70 mg by mouth once a week. Take with a full glass of water on an empty stomach.  . calcium carbonate (TUMS EX) 750 MG chewable tablet Chew 1 tablet by mouth 3 (three) times daily.  . Cholecalciferol 50 MCG (2000 UT) CAPS Take 1 capsule by mouth daily in the afternoon. Special Instructions: for supplement for osteoporosis  . dextromethorphan-guaiFENesin (ROBITUSSIN-DM) 10-100 MG/5ML liquid Take 10 mLs by mouth every 6 (six) hours as needed for cough.  . escitalopram (LEXAPRO) 5 MG tablet Take 10 mg by mouth  daily.  . esomeprazole (NEXIUM) 40 MG capsule Take 40 mg by mouth daily at 12 noon.  . feeding supplement (ENSURE ENLIVE / ENSURE PLUS) LIQD Take 237 mLs by mouth 2 (two) times daily between meals.  Marland Kitchen loperamide (IMODIUM) 2 MG capsule Take 2 mg by mouth every 4 (four) hours as needed for diarrhea or loose stools.  Marland Kitchen losartan (COZAAR) 100 MG tablet Take 100 mg by mouth daily.  . mirtazapine (REMERON) 7.5 MG tablet Take 7.5 mg by mouth at bedtime.  . NON FORMULARY Mechanical soft diet with ground meat  . OXYGEN Inhale 3 L into the lungs continuous.   No facility-administered encounter medications on file as of 05/31/2020.     SIGNIFICANT DIAGNOSTIC EXAMS   PREVIOUS  02-18-19: 2-d echo:   1. Left ventricular ejection fraction, by visual estimation, is 70 to 75%. The left ventricle has hyperdynamic function. There is no left  ventricular hypertrophy.  2. Left ventricular diastolic parameters are indeterminate.  3. The left ventricle has no regional wall motion abnormalities.  4. Global right ventricle was not well visualized.The right ventricular size is not well visualized. Right vetricular wall thickness was not assessed.  5. Left atrial size was not well visualized.  6. Right atrial size was not well visualized.  7. The mitral valve was not well visualized. No evidence of mitral valve regurgitation. No evidence of mitral stenosis.  8. The tricuspid valve is not well visualized.  9. The aortic valve was not well visualized. Aortic valve regurgitation is not visualized. Technically difficult assement, grossly there is no significant aortic stenosis. 10. The pulmonic valve was not well visualized. Pulmonic valve regurgitation is not visualized. 11. The interatrial septum was not well visualized. 12. Technically difficult and limited study  12-16-19: DEXA: t score -5.678   NO NEW EXAMS.    LABS REVIEWED PREVIOUS   04-22-19: tsh 0.317 free T3: 2.9 free T4; 0.98 06-03-19: wbc 6.3; hgb 10.8; hct 34.2; mcv 93.7 plt 185; glucose 117; bun 21; creat 0.67; k+ 3.7; na++ 140; ca 9.0  12-06-19; wbc 5.6; hgb 10.7; hct 34.2; mcv 996.9 plt 199; glucose 81; bun 17; creat 0.65; k+ 3.8; na++ 140; ca 8.7; liver normal albumin 3.0 tsh 0.491 free t3: 2.5 free t4: 0.74  NO NEW LABS.   Review of Systems  Unable to perform ROS: Dementia (unable to participate )    Physical Exam Constitutional:      General: She is not in acute distress.    Appearance: She is well-developed. She is not diaphoretic.  Neck:     Thyroid: Thyromegaly present.     Comments: 3.1 cm thyroid nodule  Cardiovascular:     Rate and Rhythm: Normal rate and regular rhythm.     Heart sounds: Normal heart sounds.  Pulmonary:     Effort: Pulmonary effort is normal. No respiratory distress.     Breath sounds: Normal breath sounds.     Comments:  02 Abdominal:     General: Bowel sounds are normal. There is no distension.     Palpations: Abdomen is soft.     Tenderness: There is no abdominal tenderness.  Musculoskeletal:        General: Normal range of motion.     Right lower leg: No edema.     Left lower leg: No edema.     Comments: Kyphosis   Lymphadenopathy:     Cervical: No cervical adenopathy.  Skin:    General: Skin is warm and dry.  Neurological:     Mental Status: She is alert. Mental status is at baseline.  Psychiatric:        Mood and Affect: Mood normal.      ASSESSMENT/ PLAN:  TODAY  1. Primary osteoarthritis multiple joints: is stable will continue tylenol 1 gm three times daily   2. Post menopausal osteoporosis is stable t score -5.678 will continue fosamax 70 mg weekly   3. Goiter: is stable free t3: 2.5 will monitor    PREVIOUS   4. Essential hypertension: is stable b/p 140/74 will continue cozaar 100 mg daily   5. Protein calorie malnutrition: is stable albumin 3.0 will continue supplements as directed  6. Aortic atherosclerosis: (ct 02-17-19) is stable will monitor   7. Major depression chronic: is stable will continue tolerating lexapro 10 mg daily will monitor   8. GERD without esophagitis: is stable has history of GI Bleed: will continue nexium 40 mg daily   9. Vascular dementia without behavioral disturbance weight is stable at 123 pounds is off aricept; she does continue remeron 7.5 mg nightly to help maintain appetite.    Will check cbc cmp tsh    [name] NP [facility]  Contact [phone] Monday through Friday 8am- 5pm  After hours call [phone]

## 2020-06-01 ENCOUNTER — Other Ambulatory Visit (HOSPITAL_COMMUNITY)
Admission: RE | Admit: 2020-06-01 | Discharge: 2020-06-01 | Disposition: A | Discharge status: Home / Self Care | Payer: Medicare Other | Source: Skilled Nursing Facility | Attending: Adult Health | Admitting: Adult Health

## 2020-06-01 DIAGNOSIS — I10 Essential (primary) hypertension: Secondary | ICD-10-CM | POA: Diagnosis not present

## 2020-06-01 LAB — COMPREHENSIVE METABOLIC PANEL
ALT: 10 U/L (ref 0–44)
AST: 18 U/L (ref 15–41)
Albumin: 2.9 g/dL — ABNORMAL LOW (ref 3.5–5.0)
Alkaline Phosphatase: 35 U/L — ABNORMAL LOW (ref 38–126)
Anion gap: 7 (ref 5–15)
BUN: 25 mg/dL — ABNORMAL HIGH (ref 8–23)
CO2: 28 mmol/L (ref 22–32)
Calcium: 8.9 mg/dL (ref 8.9–10.3)
Chloride: 106 mmol/L (ref 98–111)
Creatinine, Ser: 0.65 mg/dL (ref 0.44–1.00)
GFR, Estimated: >60 mL/min (ref >60)
Glucose, Bld: 87 mg/dL (ref 70–99)
Potassium: 4.1 mmol/L (ref 3.5–5.1)
Sodium: 141 mmol/L (ref 135–145)
Total Bilirubin: 0.4 mg/dL (ref 0.3–1.2)
Total Protein: 6 g/dL — ABNORMAL LOW (ref 6.5–8.1)

## 2020-06-01 LAB — CBC
HCT: 35.4 % — ABNORMAL LOW (ref 36.0–46.0)
Hemoglobin: 11 g/dL — ABNORMAL LOW (ref 12.0–15.0)
MCH: 30.4 pg (ref 26.0–34.0)
MCHC: 31.1 g/dL (ref 30.0–36.0)
MCV: 97.8 fL (ref 80.0–100.0)
Platelets: 197 10*3/uL (ref 150–400)
RBC: 3.62 MIL/uL — ABNORMAL LOW (ref 3.87–5.11)
RDW: 13.5 % (ref 11.5–15.5)
WBC: 5.5 10*3/uL (ref 4.0–10.5)
nRBC: 0 % (ref 0.0–0.2)

## 2020-06-01 LAB — TSH: TSH: 0.451 u[IU]/mL (ref 0.350–4.500)

## 2020-06-27 ENCOUNTER — Non-Acute Institutional Stay (SKILLED_NURSING_FACILITY): Payer: Medicare Other | Admitting: Adult Health

## 2020-06-27 ENCOUNTER — Encounter: Payer: Self-pay | Admitting: Adult Health

## 2020-06-27 DIAGNOSIS — I700 Atherosclerosis of aorta: Secondary | ICD-10-CM | POA: Diagnosis not present

## 2020-06-27 DIAGNOSIS — E43 Unspecified severe protein-calorie malnutrition: Secondary | ICD-10-CM

## 2020-06-27 DIAGNOSIS — I10 Essential (primary) hypertension: Secondary | ICD-10-CM | POA: Diagnosis not present

## 2020-06-27 NOTE — Progress Notes (Signed)
Location:  [facility] Room Number: 222 Place of Service:  SNF (31)   CODE STATUS: dnr  Allergies  Allergen Reactions  . Hydrocodone-Acetaminophen Nausea And Vomiting    Chief Complaint  Patient presents with  . Medical Management of Chronic Issues           Essential hypertension   Severe protein calorie malnutrition:Aortic atherosclerosis   Major depression chronic    HPI:  She is a 87 year old long term resident of this facility being seen for the management of her chronic illnesses: Essential hypertension   Severe protein calorie malnutrition:Aortic atherosclerosis   Major depression chronic.  There are no reports of anxiety; no reports of uncontrolled pain. She has lost a small amount of weight with her current weight at 121 pounds. She does spend all of her time in her room.   Past Medical History:  Diagnosis Date  . Anxiety   . Dementia (HCC)   . GERD (gastroesophageal reflux disease)   . Goiter   . Hypertension   . Osteoarthritis   . Osteoporosis   . Vitamin D insufficiency     Past Surgical History:  Procedure Laterality Date  . APPENDECTOMY    . KIDNEY STONE SURGERY    . ORIF HIP FRACTURE Right 11/20/2012   Procedure: OPEN REDUCTION INTERNAL FIXATION RIGHT HIP;  Surgeon: Darreld Mclean, MD;  Location: [facility];  Service: Orthopedics;  Laterality: Right;  . stress fractures of legs    . TUBAL LIGATION      Social History   Socioeconomic History  . Marital status: Widowed    Spouse name: Not on file  . Number of children: Not on file  . Years of education: Not on file  . Highest education level: Not on file  Occupational History  . Not on file  Tobacco Use  . Smoking status: Never Smoker  . Smokeless tobacco: Never Used  Vaping Use  . Vaping Use: Never used  Substance and Sexual Activity  . Alcohol use: No  . Drug use: No  . Sexual activity: Never  Other Topics Concern  . Not on file  Social History Narrative  . Not on  file   Social Determinants of Health   Financial Resource Strain: Not on file  Food Insecurity: Not on file  Transportation Needs: Not on file  Physical Activity: Not on file  Stress: Not on file  Social Connections: Not on file  Intimate Partner Violence: Not on file   Family History  Problem Relation Age of Onset  . Heart attack Father   . Heart disease Neg Hx       VITAL SIGNS BP (!) 148/74   Pulse 72   Temp 98 F (36.7 C)   Resp 18   Ht 5' (1.524 m)   Wt 121 lb 9.6 oz (55.2 kg)   BMI 23.75 kg/m   Outpatient Encounter Medications as of 06/27/2020  Medication Sig  . acetaminophen (TYLENOL) 325 MG tablet Take 650 mg by mouth in the morning, at noon, and at bedtime.  Marland Kitchen alendronate (FOSAMAX) 70 MG tablet Take 70 mg by mouth once a week. Take with a full glass of water on an empty stomach.  . calcium carbonate (TUMS EX) 750 MG chewable tablet Chew 1 tablet by mouth 3 (three) times daily.  . Cholecalciferol 50 MCG (2000 UT) CAPS Take 1 capsule by mouth daily in the afternoon. Special Instructions: for supplement for osteoporosis  . dextromethorphan-guaiFENesin (  ROBITUSSIN-DM) 10-100 MG/5ML liquid Take 10 mLs by mouth every 6 (six) hours as needed for cough.  . escitalopram (LEXAPRO) 5 MG tablet Take 10 mg by mouth daily.  Marland Kitchen esomeprazole (NEXIUM) 40 MG capsule Take 40 mg by mouth daily at 12 noon.  . feeding supplement (ENSURE ENLIVE / ENSURE PLUS) LIQD Take 237 mLs by mouth 2 (two) times daily between meals.  Marland Kitchen loperamide (IMODIUM) 2 MG capsule Take 2 mg by mouth every 4 (four) hours as needed for diarrhea or loose stools.  Marland Kitchen losartan (COZAAR) 100 MG tablet Take 100 mg by mouth daily.  . mirtazapine (REMERON) 7.5 MG tablet Take 7.5 mg by mouth at bedtime.  . NON FORMULARY Mechanical soft diet with ground meat  . OXYGEN Inhale 3 L into the lungs continuous.   No facility-administered encounter medications on file as of 06/27/2020.     SIGNIFICANT DIAGNOSTIC  EXAMS   PREVIOUS  02-18-19: 2-d echo:   1. Left ventricular ejection fraction, by visual estimation, is 70 to 75%. The left ventricle has hyperdynamic function. There is no left ventricular hypertrophy.  2. Left ventricular diastolic parameters are indeterminate.  3. The left ventricle has no regional wall motion abnormalities.  4. Global right ventricle was not well visualized.The right ventricular size is not well visualized. Right vetricular wall thickness was not assessed.  5. Left atrial size was not well visualized.  6. Right atrial size was not well visualized.  7. The mitral valve was not well visualized. No evidence of mitral valve regurgitation. No evidence of mitral stenosis.  8. The tricuspid valve is not well visualized.  9. The aortic valve was not well visualized. Aortic valve regurgitation is not visualized. Technically difficult assement, grossly there is no significant aortic stenosis. 10. The pulmonic valve was not well visualized. Pulmonic valve regurgitation is not visualized. 11. The interatrial septum was not well visualized. 12. Technically difficult and limited study  12-16-19: DEXA: t score -5.678   NO NEW EXAMS.    LABS REVIEWED PREVIOUS   06-03-19: wbc 6.3; hgb 10.8; hct 34.2; mcv 93.7 plt 185; glucose 117; bun 21; creat 0.67; k+ 3.7; na++ 140; ca 9.0  12-06-19; wbc 5.6; hgb 10.7; hct 34.2; mcv 996.9 plt 199; glucose 81; bun 17; creat 0.65; k+ 3.8; na++ 140; ca 8.7; liver normal albumin 3.0 tsh 0.491 free t3: 2.5 free t4: 0.74  TODAY  06-01-20: wbc 5.5; hgb 11.0; hct 35.4;mcv 97.8 plt 197; glucose 87; bun 25; creat 0.65; k+ 4.1; na++ 141; ca 8.9; GFR>60; liver normal albumin 2.9 tsh 0.154   Review of Systems  Unable to perform ROS: Dementia (unable to participate )   Physical Exam Constitutional:      General: She is not in acute distress.    Appearance: She is well-developed. She is not diaphoretic.  Neck:     Thyroid: Thyromegaly present.      Comments: 3.1 cm thyroid nodule  Cardiovascular:     Rate and Rhythm: Normal rate and regular rhythm.     Pulses: Normal pulses.     Heart sounds: Normal heart sounds.  Pulmonary:     Effort: Pulmonary effort is normal. No respiratory distress.     Breath sounds: Normal breath sounds.     Comments: 02 Abdominal:     General: Bowel sounds are normal. There is no distension.     Palpations: Abdomen is soft.     Tenderness: There is no abdominal tenderness.  Musculoskeletal:  General: Normal range of motion.     Cervical back: Neck supple.     Right lower leg: No edema.     Left lower leg: No edema.     Comments: Kyphosis   Lymphadenopathy:     Cervical: No cervical adenopathy.  Skin:    General: Skin is warm and dry.  Neurological:     Mental Status: She is alert. Mental status is at baseline.  Psychiatric:        Mood and Affect: Mood normal.     ASSESSMENT/ PLAN:  TODAY  1. Essential hypertension is stable b/p 148/74 will continue cozaar 100 mg daily   2. Severe protein calorie malnutrition: albumin 2.9; will begin prostat 30 mL with meals.   3. Aortic atherosclerosis (ct 02/17/19) will monitor   4. Major depression chronic: is stable will continue lexapro 10 mg daily    PREVIOUS   5. GERD without esophagitis: is stable has history of GI Bleed: will continue nexium 40 mg daily   6. Vascular dementia without behavioral disturbance weight is stable at 121 pounds is off aricept; she does continue remeron 7.5 mg nightly to help maintain appetite.   7. Primary osteoarthritis multiple joints: is stable will continue tylenol 1 gm three times daily   8. Post menopausal osteoporosis is stable t score -5.678 will continue fosamax 70 mg weekly   9. Goiter: is stable free tsh 0.451 will monitor         [name] NP [facility]  Contact [phone] Monday through Friday 8am- 5pm  After hours call [phone]

## 2020-06-28 ENCOUNTER — Encounter: Payer: Self-pay | Admitting: Adult Health

## 2020-08-02 ENCOUNTER — Encounter: Payer: Self-pay | Admitting: Adult Health

## 2020-08-02 ENCOUNTER — Non-Acute Institutional Stay (SKILLED_NURSING_FACILITY): Payer: Medicare Other | Admitting: Adult Health

## 2020-08-02 DIAGNOSIS — J9601 Acute respiratory failure with hypoxia: Secondary | ICD-10-CM | POA: Diagnosis not present

## 2020-08-02 DIAGNOSIS — M150 Primary generalized (osteo)arthritis: Secondary | ICD-10-CM

## 2020-08-02 DIAGNOSIS — K219 Gastro-esophageal reflux disease without esophagitis: Secondary | ICD-10-CM

## 2020-08-02 DIAGNOSIS — M159 Polyosteoarthritis, unspecified: Secondary | ICD-10-CM

## 2020-08-02 DIAGNOSIS — M8949 Other hypertrophic osteoarthropathy, multiple sites: Secondary | ICD-10-CM

## 2020-08-02 DIAGNOSIS — F0150 Vascular dementia without behavioral disturbance: Secondary | ICD-10-CM | POA: Diagnosis not present

## 2020-08-02 DIAGNOSIS — Z1159 Encounter for screening for other viral diseases: Secondary | ICD-10-CM | POA: Diagnosis not present

## 2020-08-02 NOTE — Progress Notes (Signed)
Location:  [facility] Room Number: 222 Place of Service:   SNF   CODE STATUS: dnr  Allergies  Allergen Reactions   Hydrocodone-Acetaminophen Nausea And Vomiting    Chief Complaint  Patient presents with   Medical Management of Chronic Issues        GERD without espohagitis:    Vascular dementia without behavioral disturbance  Primary osteoarthritis multiple joints    HPI:  She is a 87 year old long term resident of this facility being seen for the management of her chronic illnesses: GERD without espohagitis:    Vascular dementia without behavioral disturbance  Primary osteoarthritis multiple joints. There are no reports of heart burn; no reports of cough; or shortness of breath; no reports of anxiety.   Past Medical History:  Diagnosis Date   Anxiety    Dementia (HCC)    GERD (gastroesophageal reflux disease)    Goiter    Hypertension    Major depression, recurrent, chronic (HCC) 02/22/2019   Osteoarthritis    Osteoporosis    Vitamin D insufficiency     Past Surgical History:  Procedure Laterality Date   APPENDECTOMY     KIDNEY STONE SURGERY     ORIF HIP FRACTURE Right 11/20/2012   Procedure: OPEN REDUCTION INTERNAL FIXATION RIGHT HIP;  Surgeon: Darreld Mclean, MD;  Location: [facility];  Service: Orthopedics;  Laterality: Right;   stress fractures of legs     TUBAL LIGATION      Social History   Socioeconomic History   Marital status: Widowed    Spouse name: Not on file   Number of children: Not on file   Years of education: Not on file   Highest education level: Not on file  Occupational History   Not on file  Tobacco Use   Smoking status: Never   Smokeless tobacco: Never  Vaping Use   Vaping Use: Never used  Substance and Sexual Activity   Alcohol use: No   Drug use: No   Sexual activity: Never  Other Topics Concern   Not on file  Social History Narrative   Not on file   Social Determinants of Health   Financial Resource  Strain: Not on file  Food Insecurity: Not on file  Transportation Needs: Not on file  Physical Activity: Not on file  Stress: Not on file  Social Connections: Not on file  Intimate Partner Violence: Not on file   Family History  Problem Relation Age of Onset   Heart attack Father    Heart disease Neg Hx       VITAL SIGNS BP (!) 144/64   Pulse 64   Temp 98 F (36.7 C)   Resp 18   Ht 5' (1.524 m)   Wt 125 lb 3.2 oz (56.8 kg)   SpO2 97%   BMI 24.45 kg/m   Outpatient Encounter Medications as of 08/02/2020  Medication Sig   acetaminophen (TYLENOL) 325 MG tablet Take 650 mg by mouth in the morning, at noon, and at bedtime.   alendronate (FOSAMAX) 70 MG tablet Take 70 mg by mouth once a week. Take with a full glass of water on an empty stomach.   calcium carbonate (TUMS EX) 750 MG chewable tablet Chew 1 tablet by mouth 3 (three) times daily.   Cholecalciferol 50 MCG (2000 UT) CAPS Take 1 capsule by mouth daily in the afternoon. Special Instructions: for supplement for osteoporosis   dextromethorphan-guaiFENesin (ROBITUSSIN-DM) 10-100 MG/5ML liquid Take 10 mLs  by mouth every 6 (six) hours as needed for cough.   escitalopram (LEXAPRO) 5 MG tablet Take 10 mg by mouth daily.   esomeprazole (NEXIUM) 40 MG capsule Take 40 mg by mouth daily at 12 noon.   feeding supplement (ENSURE ENLIVE / ENSURE PLUS) LIQD Take 237 mLs by mouth 2 (two) times daily between meals.   loperamide (IMODIUM) 2 MG capsule Take 2 mg by mouth every 4 (four) hours as needed for diarrhea or loose stools.   losartan (COZAAR) 100 MG tablet Take 100 mg by mouth daily.   mirtazapine (REMERON) 7.5 MG tablet Take 7.5 mg by mouth at bedtime.   NON FORMULARY Mechanical soft diet with ground meat   OXYGEN Inhale 3 L into the lungs continuous.   No facility-administered encounter medications on file as of 08/02/2020.     SIGNIFICANT DIAGNOSTIC EXAMS   PREVIOUS  02-18-19: 2-d echo:   1. Left ventricular ejection  fraction, by visual estimation, is 70 to 75%. The left ventricle has hyperdynamic function. There is no left ventricular hypertrophy.  2. Left ventricular diastolic parameters are indeterminate.  3. The left ventricle has no regional wall motion abnormalities.  4. Global right ventricle was not well visualized.The right ventricular size is not well visualized. Right vetricular wall thickness was not assessed.  5. Left atrial size was not well visualized.  6. Right atrial size was not well visualized.  7. The mitral valve was not well visualized. No evidence of mitral valve regurgitation. No evidence of mitral stenosis.  8. The tricuspid valve is not well visualized.  9. The aortic valve was not well visualized. Aortic valve regurgitation is not visualized. Technically difficult assement, grossly there is no significant aortic stenosis. 10. The pulmonic valve was not well visualized. Pulmonic valve regurgitation is not visualized. 11. The interatrial septum was not well visualized. 12. Technically difficult and limited study  12-16-19: DEXA: t score -5.678   NO NEW EXAMS.    LABS REVIEWED PREVIOUS    12-06-19; wbc 5.6; hgb 10.7; hct 34.2; mcv 996.9 plt 199; glucose 81; bun 17; creat 0.65; k+ 3.8; na++ 140; ca 8.7; liver normal albumin 3.0 tsh 0.491 free t3: 2.5 free t4: 0.74 06-01-20: wbc 5.5; hgb 11.0; hct 35.4;mcv 97.8 plt 197; glucose 87; bun 25; creat 0.65; k+ 4.1; na++ 141; ca 8.9; GFR>60; liver normal albumin 2.9 tsh 0.154  NO NEW LABS.    Review of Systems  Unable to perform ROS: Dementia (unable to participate)   Physical Exam Constitutional:      General: She is not in acute distress.    Appearance: She is well-developed. She is not diaphoretic.  Neck:     Thyroid: Thyroid mass present. No thyromegaly.     Comments: 3.1 cm thyroid nodule  Cardiovascular:     Rate and Rhythm: Normal rate and regular rhythm.     Heart sounds: Normal heart sounds.  Pulmonary:     Effort:  Pulmonary effort is normal. No respiratory distress.     Breath sounds: Normal breath sounds.     Comments: 02 Abdominal:     General: Bowel sounds are normal. There is no distension.     Palpations: Abdomen is soft.     Tenderness: There is no abdominal tenderness.  Musculoskeletal:        General: Normal range of motion.     Cervical back: Neck supple.     Right lower leg: No edema.     Left lower leg: No  edema.     Comments: Kyphosis   Lymphadenopathy:     Cervical: No cervical adenopathy.  Skin:    General: Skin is warm and dry.  Neurological:     Mental Status: She is alert. Mental status is at baseline.  Psychiatric:        Mood and Affect: Mood normal.    ASSESSMENT/ PLAN:  TODAY  GERD without espohagitis: is stable has history of GI bleed: will continue nexium 40 mg daily  2. Vascular dementia without behavioral disturbance weight is stable 125 pounds; is off aricept; will continue remeron 7.5 mg nightly to help maintain appetite.   3. Primary osteoarthritis multiple joints: is stable will continue tylenol 1 gm three times daily      PREVIOUS   4. Post menopausal osteoporosis is stable t score -5.678 will continue fosamax 70 mg weekly   5. Goiter: is stable free tsh 0.451 will monitor   6. Essential hypertension is stable b/p 144/64 will continue cozaar 100 mg daily   7. Severe protein calorie malnutrition: albumin 2.9; will continue  prostat 30 mL with meals.   8. Aortic atherosclerosis (ct 02/17/19) will monitor   9. Major depression chronic: is stable will continue lexapro 10 mg daily     [name] NP [facility]  Contact [phone] Monday through Friday 8am- 5pm  After hours call [phone]

## 2020-08-03 ENCOUNTER — Encounter: Payer: Self-pay | Admitting: Adult Health

## 2020-08-03 ENCOUNTER — Non-Acute Institutional Stay (SKILLED_NURSING_FACILITY): Payer: Medicare Other | Admitting: Adult Health

## 2020-08-03 DIAGNOSIS — E43 Unspecified severe protein-calorie malnutrition: Secondary | ICD-10-CM | POA: Diagnosis not present

## 2020-08-03 DIAGNOSIS — I700 Atherosclerosis of aorta: Secondary | ICD-10-CM | POA: Diagnosis not present

## 2020-08-03 DIAGNOSIS — F339 Major depressive disorder, recurrent, unspecified: Secondary | ICD-10-CM

## 2020-08-03 DIAGNOSIS — F0150 Vascular dementia without behavioral disturbance: Secondary | ICD-10-CM

## 2020-08-03 NOTE — Progress Notes (Signed)
Location:  [facility] Room Number: 122-W Place of Service:  SNF (31)   CODE STATUS: DNR  Allergies  Allergen Reactions  . Hydrocodone-Acetaminophen Nausea And Vomiting    Chief Complaint  Patient presents with  . Acute Visit    Care plan meeting    HPI:  We have come together for her care plan meeting. BIM no; Mood 3/30: decreased interest in activities; short tempered. She is nonambulatory. She is able to feed self with supervision. She is extensive assist to dependent for her adls. She is incontinent of bladder and bowel. Her weight is 125.2 pounds with her normal at 115-121 pounds. She does attend small group activities. She is not being followed by therapy at this time. There are no reports of uncontrolled pain. She continues to be followed for her chronic illnesses including: Aortic atherosclerosis  Vascular dementia without behavioral disturbance Major depression recurrent chronic  Severe protein calorie malnutrition  Past Medical History:  Diagnosis Date  . Anxiety   . Dementia (HCC)   . GERD (gastroesophageal reflux disease)   . Goiter   . Hypertension   . Major depression, recurrent, chronic (HCC) 02/22/2019  . Osteoarthritis   . Osteoporosis   . Vitamin D insufficiency     Past Surgical History:  Procedure Laterality Date  . APPENDECTOMY    . KIDNEY STONE SURGERY    . ORIF HIP FRACTURE Right 11/20/2012   Procedure: OPEN REDUCTION INTERNAL FIXATION RIGHT HIP;  Surgeon: Darreld Mclean, MD;  Location: [facility];  Service: Orthopedics;  Laterality: Right;  . stress fractures of legs    . TUBAL LIGATION      Social History   Socioeconomic History  . Marital status: Widowed    Spouse name: Not on file  . Number of children: Not on file  . Years of education: Not on file  . Highest education level: Not on file  Occupational History  . Not on file  Tobacco Use  . Smoking status: Never  . Smokeless tobacco: Never  Vaping Use  . Vaping  Use: Never used  Substance and Sexual Activity  . Alcohol use: No  . Drug use: No  . Sexual activity: Never  Other Topics Concern  . Not on file  Social History Narrative  . Not on file   Social Determinants of Health   Financial Resource Strain: Not on file  Food Insecurity: Not on file  Transportation Needs: Not on file  Physical Activity: Not on file  Stress: Not on file  Social Connections: Not on file  Intimate Partner Violence: Not on file   Family History  Problem Relation Age of Onset  . Heart attack Father   . Heart disease Neg Hx       VITAL SIGNS BP (!) 144/64   Pulse 64   Temp 97.9 F (36.6 C)   Ht 5' (1.524 m)   Wt 125 lb 3.2 oz (56.8 kg)   SpO2 96%   BMI 24.45 kg/m   Outpatient Encounter Medications as of 08/03/2020  Medication Sig  . acetaminophen (TYLENOL) 325 MG tablet Take 650 mg by mouth in the morning, at noon, and at bedtime.  Marland Kitchen alendronate (FOSAMAX) 70 MG tablet Take 70 mg by mouth once a week. Take with a full glass of water on an empty stomach.  . calcium carbonate (TUMS EX) 750 MG chewable tablet Chew 1 tablet by mouth 3 (three) times daily.  . Cholecalciferol 50 MCG (2000 UT) CAPS  Take 1 capsule by mouth daily in the afternoon. Special Instructions: for supplement for osteoporosis  . dextromethorphan-guaiFENesin (ROBITUSSIN-DM) 10-100 MG/5ML liquid Take 10 mLs by mouth every 6 (six) hours as needed for cough.  . escitalopram (LEXAPRO) 5 MG tablet Take 10 mg by mouth daily.  Marland Kitchen esomeprazole (NEXIUM) 40 MG capsule Take 40 mg by mouth daily at 12 noon.  . feeding supplement (ENSURE ENLIVE / ENSURE PLUS) LIQD Take 237 mLs by mouth 2 (two) times daily between meals.  Marland Kitchen loperamide (IMODIUM) 2 MG capsule Take 2 mg by mouth every 4 (four) hours as needed for diarrhea or loose stools.  Marland Kitchen losartan (COZAAR) 100 MG tablet Take 100 mg by mouth daily.  . mirtazapine (REMERON) 7.5 MG tablet Take 7.5 mg by mouth at bedtime.  . NON FORMULARY Mechanical soft  diet with ground meat  . OXYGEN Inhale 3 L into the lungs continuous.   No facility-administered encounter medications on file as of 08/03/2020.     SIGNIFICANT DIAGNOSTIC EXAMS   PREVIOUS  02-18-19: 2-d echo:   1. Left ventricular ejection fraction, by visual estimation, is 70 to 75%. The left ventricle has hyperdynamic function. There is no left ventricular hypertrophy.  2. Left ventricular diastolic parameters are indeterminate.  3. The left ventricle has no regional wall motion abnormalities.  4. Global right ventricle was not well visualized.The right ventricular size is not well visualized. Right vetricular wall thickness was not assessed.  5. Left atrial size was not well visualized.  6. Right atrial size was not well visualized.  7. The mitral valve was not well visualized. No evidence of mitral valve regurgitation. No evidence of mitral stenosis.  8. The tricuspid valve is not well visualized.  9. The aortic valve was not well visualized. Aortic valve regurgitation is not visualized. Technically difficult assement, grossly there is no significant aortic stenosis. 10. The pulmonic valve was not well visualized. Pulmonic valve regurgitation is not visualized. 11. The interatrial septum was not well visualized. 12. Technically difficult and limited study  12-16-19: DEXA: t score -5.678   NO NEW EXAMS.    LABS REVIEWED PREVIOUS    12-06-19; wbc 5.6; hgb 10.7; hct 34.2; mcv 996.9 plt 199; glucose 81; bun 17; creat 0.65; k+ 3.8; na++ 140; ca 8.7; liver normal albumin 3.0 tsh 0.491 free t3: 2.5 free t4: 0.74 06-01-20: wbc 5.5; hgb 11.0; hct 35.4;mcv 97.8 plt 197; glucose 87; bun 25; creat 0.65; k+ 4.1; na++ 141; ca 8.9; GFR>60; liver normal albumin 2.9 tsh 0.154  NO NEW LABS.   Review of Systems  Unable to perform ROS: Dementia (unable to participate)   Physical Exam Constitutional:      General: She is not in acute distress.    Appearance: She is well-developed. She is not  diaphoretic.  Neck:     Thyroid: Thyroid mass present. No thyromegaly.     Comments: 3.1 cm thyroid nodule  Cardiovascular:     Rate and Rhythm: Normal rate and regular rhythm.     Pulses: Normal pulses.     Heart sounds: Normal heart sounds.  Pulmonary:     Effort: Pulmonary effort is normal. No respiratory distress.     Breath sounds: Normal breath sounds.     Comments: 02  Abdominal:     General: Bowel sounds are normal. There is no distension.     Palpations: Abdomen is soft.     Tenderness: There is no abdominal tenderness.  Musculoskeletal:  General: Normal range of motion.     Cervical back: Neck supple.     Right lower leg: No edema.     Left lower leg: No edema.     Comments: Kyphosis   Lymphadenopathy:     Cervical: No cervical adenopathy.  Skin:    General: Skin is warm and dry.  Neurological:     Mental Status: She is alert. Mental status is at baseline.  Psychiatric:        Mood and Affect: Mood normal.      ASSESSMENT/ PLAN:   TODAY  Aortic atherosclerosis Vascular dementia without behavioral disturbance Major depression recurrent chronic Severe protein calorie malnutrition  Will continue current medications  Will continue current plan of care Will continue to monitor her status.   Time spent with patient: 40 minutes; medications; plan of care; dietary need   [name] NP [facility]  Contact [phone] Monday through Friday 8am- 5pm  After hours call [phone]

## 2020-08-08 DIAGNOSIS — Z1159 Encounter for screening for other viral diseases: Secondary | ICD-10-CM | POA: Diagnosis not present

## 2020-08-08 DIAGNOSIS — J9601 Acute respiratory failure with hypoxia: Secondary | ICD-10-CM | POA: Diagnosis not present

## 2020-08-10 DIAGNOSIS — Z1159 Encounter for screening for other viral diseases: Secondary | ICD-10-CM | POA: Diagnosis not present

## 2020-08-10 DIAGNOSIS — J9601 Acute respiratory failure with hypoxia: Secondary | ICD-10-CM | POA: Diagnosis not present

## 2020-08-15 DIAGNOSIS — Z1159 Encounter for screening for other viral diseases: Secondary | ICD-10-CM | POA: Diagnosis not present

## 2020-08-15 DIAGNOSIS — J9601 Acute respiratory failure with hypoxia: Secondary | ICD-10-CM | POA: Diagnosis not present

## 2020-08-17 DIAGNOSIS — Z1159 Encounter for screening for other viral diseases: Secondary | ICD-10-CM | POA: Diagnosis not present

## 2020-08-17 DIAGNOSIS — J9601 Acute respiratory failure with hypoxia: Secondary | ICD-10-CM | POA: Diagnosis not present

## 2020-08-22 ENCOUNTER — Encounter: Payer: Self-pay | Admitting: Adult Health

## 2020-08-22 ENCOUNTER — Non-Acute Institutional Stay (SKILLED_NURSING_FACILITY): Payer: Medicare Other | Admitting: Adult Health

## 2020-08-22 DIAGNOSIS — Z0000 Encounter for general adult medical examination without abnormal findings: Secondary | ICD-10-CM

## 2020-08-22 DIAGNOSIS — I209 Angina pectoris, unspecified: Secondary | ICD-10-CM | POA: Diagnosis not present

## 2020-08-22 DIAGNOSIS — Z1159 Encounter for screening for other viral diseases: Secondary | ICD-10-CM | POA: Diagnosis not present

## 2020-08-22 DIAGNOSIS — J9601 Acute respiratory failure with hypoxia: Secondary | ICD-10-CM | POA: Diagnosis not present

## 2020-08-22 NOTE — Patient Instructions (Signed)
  Ms. Quijas , Thank you for taking time to come for your Medicare Wellness Visit. I appreciate your ongoing commitment to your health goals. Please review the following plan we discussed and let me know if I can assist you in the future.   These are the goals we discussed:  Goals      DIET - INCREASE WATER INTAKE     Follow up with Primary Care Provider     General - Client will not be readmitted within 30 days ([facility])        This is a list of the screening recommended for you and due dates:  Health Maintenance  Topic Date Due   Zoster (Shingles) Vaccine (1 of 2) Never done   Flu Shot  09/04/2020   Tetanus Vaccine  03/07/2021   DEXA scan (bone density measurement)  Completed   COVID-19 Vaccine  Completed   Pneumonia vaccines  Completed   HPV Vaccine  Aged Out

## 2020-08-22 NOTE — Progress Notes (Signed)
Subjective:   [name] is a 87 y.o. female who presents for Medicare Annual (Subsequent) preventive examination.  Review of Systems    Review of Systems  Unable to perform ROS: Dementia (unable to participate)   Cardiac Risk Factors include: advanced age (>72men, >58 women);sedentary lifestyle;hypertension     Objective:    Today's Vitals   08/22/20 0849 08/22/20 0948  BP: 134/67   Pulse: 62   Resp: (!) 24   Temp: (!) 97.1 F (36.2 C)   SpO2: 96%   Weight: 125 lb 6.4 oz (56.9 kg)   Height: 5' (1.524 m)   PainSc:  0-No pain   Body mass index is 24.49 kg/m.  Advanced Directives 08/22/2020 08/03/2020 05/31/2020 05/03/2020 03/29/2020 02/24/2020 02/18/2020  Does Patient Have a Medical Advance Directive? Yes Yes Yes Yes Yes Yes Yes  Type of Paramedic of [employer];Living will;Out of facility DNR (pink MOST or yellow form) [place];Living will;Out of facility DNR (pink MOST or yellow form) [place];Living will;Out of facility DNR (pink MOST or yellow form) [place];Living will;Out of facility DNR (pink MOST or yellow form) [place];Living will;Out of facility DNR (pink MOST or yellow form) [place];Living will;Out of facility DNR (pink MOST or yellow form) Living will;Out of facility DNR (pink MOST or yellow form)  Does patient want to make changes to medical advance directive? No - Patient declined No - Patient declined No - Patient declined No - Patient declined No - Patient declined No - Patient declined No - Patient declined  Copy of [place] in Chart? Yes - validated most recent copy scanned in chart (See row information) Yes - validated most recent copy scanned in chart (See row information) Yes - validated most recent copy scanned in chart (See row information) Yes - validated most recent copy scanned in chart (See row information) Yes -  validated most recent copy scanned in chart (See row information) Yes - validated most recent copy scanned in chart (See row information) -  Would patient like information on creating a medical advance directive? - - - - - - -  Pre-existing out of facility DNR order (yellow form or pink MOST form) - - - - - - Yellow form placed in chart (order not valid for inpatient use)    Current Medications (verified) Outpatient Encounter Medications as of 08/22/2020  Medication Sig   acetaminophen (TYLENOL) 325 MG tablet Take 650 mg by mouth in the morning, at noon, and at bedtime.   alendronate (FOSAMAX) 70 MG tablet Take 70 mg by mouth once a week. Take with a full glass of water on an empty stomach.   Amino Acids-Protein Hydrolys (FEEDING SUPPLEMENT, PRO-STAT SUGAR FREE 64,) LIQD Take 30 mLs by mouth 3 (three) times daily with meals. 9 am, 12 pm, and 6 pm   calcium carbonate (TUMS EX) 750 MG chewable tablet Chew 1 tablet by mouth 3 (three) times daily.   Cholecalciferol 50 MCG (2000 UT) CAPS Take 1 capsule by mouth daily in the afternoon. Special Instructions: for supplement for osteoporosis   dextromethorphan-guaiFENesin (ROBITUSSIN-DM) 10-100 MG/5ML liquid Take 10 mLs by mouth every 6 (six) hours as needed for cough.   escitalopram (LEXAPRO) 10 MG tablet Take 10 mg by mouth every other day. At 9 am   esomeprazole (NEXIUM) 40 MG capsule Take 40 mg by mouth daily. 9 pm   feeding supplement (ENSURE ENLIVE /  ENSURE PLUS) LIQD Take 237 mLs by mouth 2 (two) times daily between meals.   loperamide (IMODIUM) 2 MG capsule Take 2 mg by mouth every 4 (four) hours as needed for diarrhea or loose stools.   losartan (COZAAR) 100 MG tablet Take 100 mg by mouth daily.   mirtazapine (REMERON) 15 MG tablet Take 7.5 mg by mouth at bedtime.   NON FORMULARY Mechanical soft diet with ground meat   OXYGEN Inhale 3 L into the lungs continuous.   [DISCONTINUED] escitalopram (LEXAPRO) 5 MG tablet Take 10 mg by mouth daily.    [DISCONTINUED] mirtazapine (REMERON) 7.5 MG tablet Take 7.5 mg by mouth at bedtime.   No facility-administered encounter medications on file as of 08/22/2020.    Allergies (verified) Hydrocodone-acetaminophen   History: Past Medical History:  Diagnosis Date   Anxiety    Dementia ([place])    GERD (gastroesophageal reflux disease)    Goiter    Hypertension    Major depression, recurrent, chronic ([place]) 02/22/2019   Osteoarthritis    Osteoporosis    Vitamin D insufficiency    Past Surgical History:  Procedure Laterality Date   APPENDECTOMY     KIDNEY STONE SURGERY     ORIF HIP FRACTURE Right 11/20/2012   Procedure: OPEN REDUCTION INTERNAL FIXATION RIGHT HIP;  Surgeon: Sanjuana Kava, MD;  Location: [facility];  Service: Orthopedics;  Laterality: Right;   stress fractures of legs     TUBAL LIGATION     Family History  Problem Relation Age of Onset   Heart attack Father    Heart disease Neg Hx    Social History   Socioeconomic History   Marital status: Widowed    Spouse name: Not on file   Number of children: Not on file   Years of education: Not on file   Highest education level: Not on file  Occupational History   Occupation: retired  Tobacco Use   Smoking status: Never   Smokeless tobacco: Never  Vaping Use   Vaping Use: Never used  Substance and Sexual Activity   Alcohol use: No   Drug use: No   Sexual activity: Not Currently  Other Topics Concern   Not on file  Social History Narrative   Long term resident of [facility]    Social Determinants of Health   Financial Resource Strain: Not on file  Food Insecurity: Not on file  Transportation Needs: Not on file  Physical Activity: Not on file  Stress: Not on file  Social Connections: Not on file    Tobacco Counseling Counseling given: Not Answered   Clinical Intake:  Pre-visit preparation completed: Yes  Pain : No/denies pain Pain Score: 0-No pain     BMI - recorded: 24.49 Nutritional Status: BMI of 19-24   Normal Nutritional Risks: Unintentional weight loss, Failure to thrive Diabetes: No  How often do you need to have someone help you when you read instructions, pamphlets, or other written materials from your doctor or pharmacy?: 5 - [place] Needed?: No  Comments: resident of long term care; has dementia   Activities of Daily Living In your present state of health, do you have any difficulty performing the following activities: 08/22/2020  Hearing? N  Difficulty concentrating or making decisions? Y  Walking or climbing stairs? Y  Dressing or bathing? Y  Doing errands, shopping? Y  Preparing Food and eating ? Y  Using the Toilet? Y  In the past six months, have you accidently  leaked urine? Y  Do you have problems with loss of bowel control? Y  Managing your Medications? Y  Managing your Finances? Y  Housekeeping or managing your Housekeeping? Y  Some recent data might be hidden    Patient Care Team: Gerlene Fee, NP as PCP - General (Geriatric Medicine) [name], MD (Inactive) as PCP - Cardiology ([facility] [place] ([place])  Indicate any recent [place] you may have received from other than [facility] providers in the past year (date may be approximate).     Assessment:   This is a routine wellness examination for [place].  Hearing/Vision screen No results found.  Dietary issues and exercise activities discussed: Current Exercise Habits: The patient does not participate in regular exercise at present   Goals Addressed             This Visit's Progress    DIET - [place]   On track    Follow up with Primary Care Provider   On track    General - Client will not be readmitted within 30 days ([facility])   On track      Depression Screen PHQ 2/9 Scores 08/22/2020 06/25/2019  PHQ - 2 Score 0 -  Exception Documentation - Medical reason    Fall Risk Fall Risk  08/22/2020 06/25/2019   Falls in the past year? 0 0  Number falls in past yr: 0 -  Injury with Fall? 0 -  Risk for fall due to : Impaired balance/gait;Impaired mobility Impaired balance/gait;Impaired mobility    FALL RISK PREVENTION PERTAINING TO THE HOME:  Any stairs in or around the home? No  If so, are there any without handrails? No  Home free of loose throw rugs in walkways, pet beds, electrical cords, etc? No  Adequate lighting in your home to reduce risk of falls? Yes   ASSISTIVE DEVICES UTILIZED TO PREVENT FALLS:  Life alert? No  Use of a cane, walker or w/c? Yes  Grab bars in the bathroom? Yes  Shower chair or bench in shower? Yes  Elevated toilet seat or a handicapped toilet? Yes   TIMED UP AND GO:  Was the test performed? No .  Length of time to ambulate 10 feet: n/a sec.    unable to ambulate   Cognitive Function: MMSE - Mini Mental State Exam 08/22/2020  Not completed: Unable to complete     6CIT Screen 08/22/2020  What Year? (No Data)    Immunizations Immunization History  Administered Date(s) Administered   Influenza-Unspecified 11/26/2018, 11/12/2019   MMR 04/06/2015   Moderna SARS-COV2 Booster Vaccination 05/17/2020   Moderna Sars-Covid-2 Vaccination 05/06/2019, 06/02/2019, 12/09/2019   Pneumococcal Conjugate-13 04/06/2015   Pneumococcal-Unspecified 11/16/2001   Tdap 03/08/2011    TDAP status: Due, Education has been provided regarding the importance of this vaccine. Advised may receive this vaccine at local pharmacy or Health Dept. Aware to provide a copy of the vaccination record if obtained from local pharmacy or Health Dept. Verbalized acceptance and understanding.  Flu Vaccine status: Up to date  Pneumococcal vaccine status: Up to date  Covid-19 vaccine status: Completed vaccines  Qualifies for Shingles Vaccine? No   Zostavax completed No   Shingrix Completed?: No.    Education has been provided regarding the importance of this vaccine. Patient has been  advised to call insurance company to determine out of pocket expense if they have not yet received this vaccine. Advised may also receive vaccine at local pharmacy  or Health Dept. Verbalized acceptance and understanding.  Screening Tests Health Maintenance  Topic Date Due   Zoster Vaccines- Shingrix (1 of 2) Never done   INFLUENZA VACCINE  09/04/2020   TETANUS/TDAP  03/07/2021   DEXA SCAN  Completed   COVID-19 Vaccine  Completed   PNA vac Low Risk Adult  Completed   HPV VACCINES  Aged Out    Health Maintenance  Health Maintenance Due  Topic Date Due   Zoster Vaccines- Shingrix (1 of 2) Never done    Colorectal cancer screening: No longer required.   Mammogram status: No longer required due to age.  Bone Density status: Ordered  . Pt provided with contact info and advised to call to schedule appt.  Lung Cancer Screening: (Low Dose CT Chest recommended if Age 88-80 years, 30 pack-year currently smoking OR have quit w/in 15years.) does not qualify.   Lung Cancer Screening Referral: n/a  Additional Screening:  Hepatitis C Screening: does not qualify; Completed n/a  Vision Screening: Recommended annual ophthalmology exams for early detection of glaucoma and other disorders of the eye. Is the patient up to date with their annual eye exam?  Yes  Who is the provider or what is the name of the office in which the patient attends annual eye exams? At facility  If pt is not established with a provider, would they like to be referred to a provider to establish care? No .   Dental Screening: Recommended annual dental exams for proper oral hygiene  Community Resource Referral / Chronic Care Management: CRR required this visit?  No   CCM required this visit?  No      Plan:     I have personally reviewed and noted the following in the patient's chart:   Medical and social history Use of alcohol, tobacco or illicit drugs  Current medications and supplements including opioid  prescriptions.  Functional ability and status Nutritional status Physical activity Advanced directives List of other physicians Hospitalizations, surgeries, and ER visits in previous 12 months Vitals Screenings to include cognitive, depression, and falls Referrals and appointments  In addition, I have reviewed and discussed with patient certain preventive protocols, quality metrics, and best practice recommendations. A written personalized care plan for preventive services as well as general preventive health recommendations were provided to patient.     Gerlene Fee, NP   08/22/2020   Nurse Notes:

## 2020-08-24 DIAGNOSIS — J9601 Acute respiratory failure with hypoxia: Secondary | ICD-10-CM | POA: Diagnosis not present

## 2020-08-24 DIAGNOSIS — Z1159 Encounter for screening for other viral diseases: Secondary | ICD-10-CM | POA: Diagnosis not present

## 2020-08-25 ENCOUNTER — Non-Acute Institutional Stay (SKILLED_NURSING_FACILITY): Payer: Medicare Other | Admitting: Internal Medicine

## 2020-08-25 ENCOUNTER — Encounter: Payer: Self-pay | Admitting: Internal Medicine

## 2020-08-25 DIAGNOSIS — D649 Anemia, unspecified: Secondary | ICD-10-CM

## 2020-08-25 DIAGNOSIS — I10 Essential (primary) hypertension: Secondary | ICD-10-CM | POA: Diagnosis not present

## 2020-08-25 DIAGNOSIS — E049 Nontoxic goiter, unspecified: Secondary | ICD-10-CM | POA: Diagnosis not present

## 2020-08-25 DIAGNOSIS — E440 Moderate protein-calorie malnutrition: Secondary | ICD-10-CM | POA: Diagnosis not present

## 2020-08-25 NOTE — Assessment & Plan Note (Signed)
H/H 11/35.4, improved serially

## 2020-08-25 NOTE — Assessment & Plan Note (Signed)
Current TSH therapeutic

## 2020-08-25 NOTE — Progress Notes (Signed)
  NURSING HOME LOCATION:  [facility] ROOM NUMBER:  122 W  CODE STATUS:  DNR  PCP:  [name] NP  This is a nursing facility follow up visit of chronic medical diagnoses & to document compliance with Regulation 483.30 (c) in The Long Term Care Survey Manual Phase 2 which mandates caregiver visit ( visits can alternate among physician, PA or NP as per statutes) within 10 days of 30 days / 60 days/ 90 days post admission to SNF date    Interim medical record and care since last SNF visit was updated with review of diagnostic studies and change in clinical status since last visit were documented.  HPI: She is a permanent resident of this facility with medical diagnoses of GERD, history of goiter, essential hypertension, anxiety/major depression, osteoporosis in the context of vitamin D deficiency, and vascular dementia. Surgeries and procedures include renal calculi surgery and ORIF of the hip.  Review of systems: Dementia invalidated responses. Date of birth given as [birth year]???... "got me on that". She could answer no queries or follow commands.  Physical exam:  Pertinent or positive findings: She appears thin and somewhat chronically ill.  Bilateral ptosis is present, greater on the right.  Dentition is poor with multiple missing maxillary teeth with erosions to and below the gumline.  Heart sounds are markedly distant.  Breath sounds are decreased.  1+ edema  but pedal pulses are palpable.  General appearance: no acute distress, increased work of breathing is present.   Lymphatic: No lymphadenopathy about the head, neck, axilla. Eyes: No conjunctival inflammation or lid edema is present. There is no scleral icterus. Ears:  External ear exam shows no significant lesions or deformities.   Nose:  External nasal examination shows no deformity or inflammation. Nasal mucosa are pink and moist without lesions, exudates Oral exam:  There is no oropharyngeal erythema or  exudate. Neck:  No thyromegaly, masses, tenderness noted.    Heart: no click, murmur, or rub. Lungs: without wheezes, rhonchi, rales, rubs. Abdomen: Bowel sounds are normal. Abdomen is soft and nontender with no organomegaly, hernias, masses. GU: Deferred  Extremities:  No cyanosis, clubbing Neurologic exam :Balance, Rhomberg, finger to nose testing could not be completed due to clinical state Skin: Warm & dry w/o tenting. No significant lesions or rash.  See summary under each active problem in the Problem List with associated updated therapeutic plan

## 2020-08-25 NOTE — Assessment & Plan Note (Addendum)
Current total protein 6, albumin 2.9 Nutritionist @ SNF will monitor

## 2020-08-25 NOTE — Patient Instructions (Signed)
See assessment and plan under each diagnosis in the problem list and acutely for this visit 

## 2020-08-25 NOTE — Assessment & Plan Note (Signed)
BP controlled; no change in antihypertensive medications  

## 2020-08-29 DIAGNOSIS — J9601 Acute respiratory failure with hypoxia: Secondary | ICD-10-CM | POA: Diagnosis not present

## 2020-08-29 DIAGNOSIS — Z1159 Encounter for screening for other viral diseases: Secondary | ICD-10-CM | POA: Diagnosis not present

## 2020-08-31 DIAGNOSIS — J9601 Acute respiratory failure with hypoxia: Secondary | ICD-10-CM | POA: Diagnosis not present

## 2020-08-31 DIAGNOSIS — Z1159 Encounter for screening for other viral diseases: Secondary | ICD-10-CM | POA: Diagnosis not present

## 2020-09-04 DIAGNOSIS — L602 Onychogryphosis: Secondary | ICD-10-CM | POA: Diagnosis not present

## 2020-09-04 DIAGNOSIS — I739 Peripheral vascular disease, unspecified: Secondary | ICD-10-CM | POA: Diagnosis not present

## 2020-09-04 DIAGNOSIS — L603 Nail dystrophy: Secondary | ICD-10-CM | POA: Diagnosis not present

## 2020-09-05 DIAGNOSIS — J9601 Acute respiratory failure with hypoxia: Secondary | ICD-10-CM | POA: Diagnosis not present

## 2020-09-05 DIAGNOSIS — Z1159 Encounter for screening for other viral diseases: Secondary | ICD-10-CM | POA: Diagnosis not present

## 2020-10-04 ENCOUNTER — Encounter: Payer: Self-pay | Admitting: Adult Health

## 2020-10-04 ENCOUNTER — Non-Acute Institutional Stay (SKILLED_NURSING_FACILITY): Payer: Medicare Other | Admitting: Adult Health

## 2020-10-04 DIAGNOSIS — I10 Essential (primary) hypertension: Secondary | ICD-10-CM | POA: Diagnosis not present

## 2020-10-04 DIAGNOSIS — E049 Nontoxic goiter, unspecified: Secondary | ICD-10-CM | POA: Diagnosis not present

## 2020-10-04 DIAGNOSIS — M810 Age-related osteoporosis without current pathological fracture: Secondary | ICD-10-CM

## 2020-10-04 NOTE — Progress Notes (Signed)
Location:  [facility] Room Number: 122 Place of Service:  SNF (31) [name], NP  CODE STATUS: DNR  Allergies  Allergen Reactions   Hydrocodone-Acetaminophen Nausea And Vomiting    Chief Complaint  Patient presents with   Medical Management of Chronic Issues              Postmenopausal osteoporosis:  Goiter:  Essential hypertension:    HPI:  She is a 87 year old long term resident of this facility being seen for the management of her chronic illnesses:  Postmenopausal osteoporosis:  Goiter:  Essential hypertension. There are no reports of uncontrolled pain; no shortness of breath or coughing.   Past Medical History:  Diagnosis Date   Anxiety    Dementia ([facility])    GERD (gastroesophageal reflux disease)    Goiter    Hypertension    Major depression, recurrent, chronic ([facility]) 02/22/2019   Osteoarthritis    Osteoporosis    Vitamin D insufficiency     Past Surgical History:  Procedure Laterality Date   APPENDECTOMY     KIDNEY STONE SURGERY     ORIF HIP FRACTURE Right 11/20/2012   Procedure: OPEN REDUCTION INTERNAL FIXATION RIGHT HIP;  Surgeon: Darreld Mclean, MD;  Location: [facility];  Service: Orthopedics;  Laterality: Right;   stress fractures of legs     TUBAL LIGATION      Social History   Socioeconomic History   Marital status: Widowed    Spouse name: Not on file   Number of children: Not on file   Years of education: Not on file   Highest education level: Not on file  Occupational History   Occupation: retired  Tobacco Use   Smoking status: Never   Smokeless tobacco: Never  Vaping Use   Vaping Use: Never used  Substance and Sexual Activity   Alcohol use: No   Drug use: No   Sexual activity: Not Currently  Other Topics Concern   Not on file  Social History Narrative   Long term resident of [facility]    Social Determinants of Health   Financial Resource Strain: Not on file  Food Insecurity: Not on file  Transportation Needs: Not  on file  Physical Activity: Not on file  Stress: Not on file  Social Connections: Not on file  Intimate Partner Violence: Not on file   Family History  Problem Relation Age of Onset   Heart attack Father    Heart disease Neg Hx       VITAL SIGNS BP 121/77   Pulse 92   Temp 97.6 F (36.4 C)   Resp 16   Ht 5' (1.524 m)   Wt 123 lb (55.8 kg)   SpO2 96%   BMI 24.02 kg/m   Outpatient Encounter Medications as of 10/04/2020  Medication Sig   acetaminophen (TYLENOL) 325 MG tablet Take 650 mg by mouth in the morning, at noon, and at bedtime.   alendronate (FOSAMAX) 70 MG tablet Take 70 mg by mouth once a week. Take with a full glass of water on an empty stomach.   Amino Acids-Protein Hydrolys (FEEDING SUPPLEMENT, PRO-STAT SUGAR FREE 64,) LIQD Take 30 mLs by mouth 3 (three) times daily with meals. 9 am, 12 pm, and 6 pm   calcium carbonate (TUMS EX) 750 MG chewable tablet Chew 1 tablet by mouth 3 (three) times daily.   Cholecalciferol 50 MCG (2000 UT) CAPS Take 1 capsule by mouth daily in the afternoon. Special Instructions:  for supplement for osteoporosis   dextromethorphan-guaiFENesin (ROBITUSSIN-DM) 10-100 MG/5ML liquid Take 10 mLs by mouth every 6 (six) hours as needed for cough.   feeding supplement (ENSURE ENLIVE / ENSURE PLUS) LIQD Take 237 mLs by mouth 2 (two) times daily between meals.   loperamide (IMODIUM) 2 MG capsule Take 2 mg by mouth every 4 (four) hours as needed for diarrhea or loose stools.   losartan (COZAAR) 100 MG tablet Take 100 mg by mouth daily.   mirtazapine (REMERON) 15 MG tablet Take 7.5 mg by mouth at bedtime.   NON FORMULARY Mechanical soft diet with ground meat   omeprazole (PRILOSEC OTC) 20 MG tablet Take 20 mg by mouth daily.   OXYGEN Inhale 3 L into the lungs continuous.   [DISCONTINUED] escitalopram (LEXAPRO) 10 MG tablet Take 10 mg by mouth every other day. At 9 am   No facility-administered encounter medications on file as of 10/04/2020.      SIGNIFICANT DIAGNOSTIC EXAMS  PREVIOUS  02-18-19: 2-d echo:   1. Left ventricular ejection fraction, by visual estimation, is 70 to 75%. The left ventricle has hyperdynamic function. There is no left ventricular hypertrophy.  2. Left ventricular diastolic parameters are indeterminate.  3. The left ventricle has no regional wall motion abnormalities.  4. Global right ventricle was not well visualized.The right ventricular size is not well visualized. Right vetricular wall thickness was not assessed.  5. Left atrial size was not well visualized.  6. Right atrial size was not well visualized.  7. The mitral valve was not well visualized. No evidence of mitral valve regurgitation. No evidence of mitral stenosis.  8. The tricuspid valve is not well visualized.  9. The aortic valve was not well visualized. Aortic valve regurgitation is not visualized. Technically difficult assement, grossly there is no significant aortic stenosis. 10. The pulmonic valve was not well visualized. Pulmonic valve regurgitation is not visualized. 11. The interatrial septum was not well visualized. 12. Technically difficult and limited study  12-16-19: DEXA: t score -5.678   NO NEW EXAMS.    LABS REVIEWED PREVIOUS    12-06-19; wbc 5.6; hgb 10.7; hct 34.2; mcv 996.9 plt 199; glucose 81; bun 17; creat 0.65; k+ 3.8; na++ 140; ca 8.7; liver normal albumin 3.0 tsh 0.491 free t3: 2.5 free t4: 0.74 06-01-20: wbc 5.5; hgb 11.0; hct 35.4;mcv 97.8 plt 197; glucose 87; bun 25; creat 0.65; k+ 4.1; na++ 141; ca 8.9; GFR>60; liver normal albumin 2.9 tsh 0.154  NO NEW LABS.   Review of Systems  Unable to perform ROS: Dementia (unable to participate)    Physical Exam Constitutional:      General: She is not in acute distress.    Appearance: She is well-developed. She is not diaphoretic.  Neck:     Thyroid: Thyroid mass present. No thyromegaly.     Comments: 3.1 cm thyroid nodule  Cardiovascular:     Rate and  Rhythm: Normal rate and regular rhythm.     Heart sounds: Normal heart sounds.  Pulmonary:     Effort: Pulmonary effort is normal. No respiratory distress.     Breath sounds: Normal breath sounds.     Comments: 02 Abdominal:     General: Bowel sounds are normal. There is no distension.     Palpations: Abdomen is soft.     Tenderness: There is no abdominal tenderness.  Musculoskeletal:        General: Normal range of motion.     Cervical back: Neck supple.  Right lower leg: No edema.     Left lower leg: No edema.     Comments: Kyphosis   Lymphadenopathy:     Cervical: No cervical adenopathy.  Skin:    General: Skin is warm and dry.  Neurological:     Mental Status: She is alert. Mental status is at baseline.  Psychiatric:        Mood and Affect: Mood normal.      ASSESSMENT/ PLAN:  TODAY   1. Postmenopausal osteoporosis: is stable t score -5.678 will continue fosamax 70 mg weekly   2. Goiter: is stable tsh 0.451 will monitor   3. Essential hypertension: is stable b/p 121/77 will continue cozaar 100 mg daily    PREVIOUS   4. Severe protein calorie malnutrition: albumin 2.9; will continue  prostat 30 mL with meals.   5. Aortic atherosclerosis (ct 02/17/19) will monitor   6. Major depression chronic: is stable will continue lexapro 10 mg daily  7. GERD without espohagitis: is stable has history of GI bleed: will continue nexium 40 mg daily  8. Vascular dementia without behavioral disturbance weight is stable 123 pounds; is off aricept; will continue remeron 7.5 mg nightly to help maintain appetite.   9. Primary osteoarthritis multiple joints: is stable will continue tylenol 1 gm three times daily     [name] NP [facility]  Contact [phone] Monday through Friday 8am- 5pm  After hours call [phone]

## 2020-10-30 ENCOUNTER — Non-Acute Institutional Stay (SKILLED_NURSING_FACILITY): Payer: Medicare Other | Admitting: Adult Health

## 2020-10-30 ENCOUNTER — Encounter: Payer: Self-pay | Admitting: Adult Health

## 2020-10-30 DIAGNOSIS — E43 Unspecified severe protein-calorie malnutrition: Secondary | ICD-10-CM

## 2020-10-30 DIAGNOSIS — I700 Atherosclerosis of aorta: Secondary | ICD-10-CM | POA: Diagnosis not present

## 2020-10-30 DIAGNOSIS — K219 Gastro-esophageal reflux disease without esophagitis: Secondary | ICD-10-CM | POA: Diagnosis not present

## 2020-10-30 DIAGNOSIS — F339 Major depressive disorder, recurrent, unspecified: Secondary | ICD-10-CM | POA: Diagnosis not present

## 2020-10-30 NOTE — Progress Notes (Signed)
Location:  [facility] Room Number: 152-W Place of Service:  SNF (31)   CODE STATUS: DNR  Allergies  Allergen Reactions   Hydrocodone-Acetaminophen Nausea And Vomiting    Chief Complaint  Patient presents with   Medical Management of Chronic Issues           Severe protein calorie malnutrition:  Aortic atherosclerosis . Major depression chronic: . GERD without esophagitis:    HPI:  She is a 87 year old long term resident of this facility being seen for the management of her chronic illnesses: Severe protein calorie malnutrition:  Aortic atherosclerosis . Major depression chronic: . GERD without esophagitis. There are no reports of uncontrolled pain; there are no reports of agitation; no reports of depressive thoughts.   Past Medical History:  Diagnosis Date   Anxiety    Dementia (HCC)    GERD (gastroesophageal reflux disease)    Goiter    Hypertension    Major depression, recurrent, chronic (HCC) 02/22/2019   Osteoarthritis    Osteoporosis    Vitamin D insufficiency     Past Surgical History:  Procedure Laterality Date   APPENDECTOMY     KIDNEY STONE SURGERY     ORIF HIP FRACTURE Right 11/20/2012   Procedure: OPEN REDUCTION INTERNAL FIXATION RIGHT HIP;  Surgeon: Darreld Mclean, MD;  Location: [facility];  Service: Orthopedics;  Laterality: Right;   stress fractures of legs     TUBAL LIGATION      Social History   Socioeconomic History   Marital status: Widowed    Spouse name: Not on file   Number of children: Not on file   Years of education: Not on file   Highest education level: Not on file  Occupational History   Occupation: retired  Tobacco Use   Smoking status: Never   Smokeless tobacco: Never  Vaping Use   Vaping Use: Never used  Substance and Sexual Activity   Alcohol use: No   Drug use: No   Sexual activity: Not Currently  Other Topics Concern   Not on file  Social History Narrative   Long term resident of [facility]    Social  Determinants of Health   Financial Resource Strain: Not on file  Food Insecurity: Not on file  Transportation Needs: Not on file  Physical Activity: Not on file  Stress: Not on file  Social Connections: Not on file  Intimate Partner Violence: Not on file   Family History  Problem Relation Age of Onset   Heart attack Father    Heart disease Neg Hx       VITAL SIGNS BP (!) 144/72   Pulse 80   Temp 97.8 F (36.6 C)   Resp 18   Ht 5' (1.524 m)   Wt 119 lb 9.6 oz (54.3 kg)   SpO2 92%   BMI 23.36 kg/m   Outpatient Encounter Medications as of 10/30/2020  Medication Sig   acetaminophen (TYLENOL) 325 MG tablet Take 650 mg by mouth in the morning, at noon, and at bedtime.   alendronate (FOSAMAX) 70 MG tablet Take 70 mg by mouth once a week. Take with a full glass of water on an empty stomach.   Amino Acids-Protein Hydrolys (FEEDING SUPPLEMENT, PRO-STAT SUGAR FREE 64,) LIQD Take 30 mLs by mouth 3 (three) times daily with meals. 9 am, 12 pm, and 6 pm   calcium carbonate (TUMS EX) 750 MG chewable tablet Chew 1 tablet by mouth 3 (three) times daily.  Cholecalciferol 50 MCG (2000 UT) CAPS Take 1 capsule by mouth daily in the afternoon. Special Instructions: for supplement for osteoporosis   dextromethorphan-guaiFENesin (ROBITUSSIN-DM) 10-100 MG/5ML liquid Take 10 mLs by mouth every 6 (six) hours as needed for cough.   feeding supplement (ENSURE ENLIVE / ENSURE PLUS) LIQD Take 237 mLs by mouth 2 (two) times daily between meals.   loperamide (IMODIUM) 2 MG capsule Take 2 mg by mouth every 4 (four) hours as needed for diarrhea or loose stools.   losartan (COZAAR) 100 MG tablet Take 100 mg by mouth daily.   mirtazapine (REMERON) 15 MG tablet Take 7.5 mg by mouth at bedtime.   NON FORMULARY Diet:Mechanical soft diet with ground meat   omeprazole (PRILOSEC OTC) 20 MG tablet Take 20 mg by mouth daily.   OXYGEN Inhale 3 L into the lungs continuous.   No facility-administered encounter  medications on file as of 10/30/2020.     SIGNIFICANT DIAGNOSTIC EXAMS  PREVIOUS  02-18-19: 2-d echo:   1. Left ventricular ejection fraction, by visual estimation, is 70 to 75%. The left ventricle has hyperdynamic function. There is no left ventricular hypertrophy.  2. Left ventricular diastolic parameters are indeterminate.  3. The left ventricle has no regional wall motion abnormalities.  4. Global right ventricle was not well visualized.The right ventricular size is not well visualized. Right vetricular wall thickness was not assessed.  5. Left atrial size was not well visualized.  6. Right atrial size was not well visualized.  7. The mitral valve was not well visualized. No evidence of mitral valve regurgitation. No evidence of mitral stenosis.  8. The tricuspid valve is not well visualized.  9. The aortic valve was not well visualized. Aortic valve regurgitation is not visualized. Technically difficult assement, grossly there is no significant aortic stenosis. 10. The pulmonic valve was not well visualized. Pulmonic valve regurgitation is not visualized. 11. The interatrial septum was not well visualized. 12. Technically difficult and limited study  12-16-19: DEXA: t score -5.678   NO NEW EXAMS.    LABS REVIEWED PREVIOUS    12-06-19; wbc 5.6; hgb 10.7; hct 34.2; mcv 996.9 plt 199; glucose 81; bun 17; creat 0.65; k+ 3.8; na++ 140; ca 8.7; liver normal albumin 3.0 tsh 0.491 free t3: 2.5 free t4: 0.74 06-01-20: wbc 5.5; hgb 11.0; hct 35.4;mcv 97.8 plt 197; glucose 87; bun 25; creat 0.65; k+ 4.1; na++ 141; ca 8.9; GFR>60; liver normal albumin 2.9 tsh 0.154  NO NEW LABS.   Review of Systems  Unable to perform ROS: Dementia (unable to participate)    Physical Exam Constitutional:      General: She is not in acute distress.    Appearance: She is well-developed. She is not diaphoretic.  Neck:     Thyroid: Thyroid mass present. No thyromegaly.     Comments:  3.1 cm thyroid nodule   Cardiovascular:     Rate and Rhythm: Normal rate and regular rhythm.     Heart sounds: Normal heart sounds.  Pulmonary:     Effort: Pulmonary effort is normal. No respiratory distress.     Breath sounds: Normal breath sounds.     Comments: 02 Abdominal:     General: Bowel sounds are normal. There is no distension.     Palpations: Abdomen is soft.     Tenderness: There is no abdominal tenderness.  Musculoskeletal:        General: Normal range of motion.     Cervical back: Neck supple.  Right lower leg: No edema.     Left lower leg: No edema.     Comments: Kyphosis   Lymphadenopathy:     Cervical: No cervical adenopathy.  Skin:    General: Skin is warm and dry.  Neurological:     Mental Status: She is alert. Mental status is at baseline.  Psychiatric:        Mood and Affect: Mood normal.    ASSESSMENT/ PLAN:  TODAY  Severe protein calorie malnutrition: is stable albumin 2.9; will continue prostat 30 mL with meals  2. Aortic atherosclerosis (ct 02-17-19) will monitor   3. Major depression chronic: is stable will continue remeron 7.5 mg nightly has failed coming off this medication for her appetite is off lexapro  4. GERD without esophagitis: is stable has history of GI bleed will continue mexium 40 mg daily     PREVIOUS   5. Vascular dementia without behavioral disturbance weight is stable 119 pounds; is off aricept; will continue remeron 7.5 mg nightly to help maintain appetite.   6. Primary osteoarthritis multiple joints: is stable will continue tylenol 1 gm three times daily   7. Postmenopausal osteoporosis: is stable t score -5.678 will continue fosamax 70 mg weekly   8. Goiter: is stable tsh 0.451 will monitor   9. Essential hypertension: is stable b/p 144/72will continue cozaar 100 mg daily        [name] NP [facility]  Contact [phone] Monday through Friday 8am- 5pm  After hours call [phone]

## 2020-11-08 DIAGNOSIS — Z23 Encounter for immunization: Secondary | ICD-10-CM | POA: Diagnosis not present

## 2020-11-09 ENCOUNTER — Encounter (HOSPITAL_COMMUNITY)
Admission: RE | Admit: 2020-11-09 | Discharge: 2020-11-09 | Disposition: A | Discharge status: Home / Self Care | Payer: Medicare Other | Source: Skilled Nursing Facility | Attending: Adult Health | Admitting: Adult Health

## 2020-11-09 DIAGNOSIS — F0390 Unspecified dementia without behavioral disturbance: Secondary | ICD-10-CM | POA: Insufficient documentation

## 2020-11-09 DIAGNOSIS — Z885 Allergy status to narcotic agent status: Secondary | ICD-10-CM | POA: Diagnosis not present

## 2020-11-09 LAB — COMPREHENSIVE METABOLIC PANEL
ALT: 12 U/L (ref 0–44)
AST: 16 U/L (ref 15–41)
Albumin: 3.2 g/dL — ABNORMAL LOW (ref 3.5–5.0)
Alkaline Phosphatase: 38 U/L (ref 38–126)
Anion gap: 6 (ref 5–15)
BUN: 14 mg/dL (ref 8–23)
CO2: 28 mmol/L (ref 22–32)
Calcium: 8.5 mg/dL — ABNORMAL LOW (ref 8.9–10.3)
Chloride: 106 mmol/L (ref 98–111)
Creatinine, Ser: 0.65 mg/dL (ref 0.44–1.00)
GFR, Estimated: >60 mL/min (ref >60)
Glucose, Bld: 98 mg/dL (ref 70–99)
Potassium: 3.7 mmol/L (ref 3.5–5.1)
Sodium: 140 mmol/L (ref 135–145)
Total Bilirubin: 0.6 mg/dL (ref 0.3–1.2)
Total Protein: 6.2 g/dL — ABNORMAL LOW (ref 6.5–8.1)

## 2020-11-09 LAB — CBC
HCT: 38.8 % (ref 36.0–46.0)
Hemoglobin: 12.6 g/dL (ref 12.0–15.0)
MCH: 31.3 pg (ref 26.0–34.0)
MCHC: 32.5 g/dL (ref 30.0–36.0)
MCV: 96.3 fL (ref 80.0–100.0)
Platelets: 204 10*3/uL (ref 150–400)
RBC: 4.03 MIL/uL (ref 3.87–5.11)
RDW: 13.6 % (ref 11.5–15.5)
WBC: 7 10*3/uL (ref 4.0–10.5)
nRBC: 0 % (ref 0.0–0.2)

## 2020-11-09 LAB — T4, FREE: Free T4: 1.15 ng/dL — ABNORMAL HIGH (ref 0.61–1.12)

## 2020-11-09 LAB — TSH: TSH: 0.687 u[IU]/mL (ref 0.350–4.500)

## 2020-11-10 ENCOUNTER — Encounter: Payer: Self-pay | Admitting: Adult Health

## 2020-11-10 ENCOUNTER — Non-Acute Institutional Stay (SKILLED_NURSING_FACILITY): Payer: Medicare Other | Admitting: Adult Health

## 2020-11-10 DIAGNOSIS — F339 Major depressive disorder, recurrent, unspecified: Secondary | ICD-10-CM | POA: Diagnosis not present

## 2020-11-10 DIAGNOSIS — I700 Atherosclerosis of aorta: Secondary | ICD-10-CM

## 2020-11-10 DIAGNOSIS — J9611 Chronic respiratory failure with hypoxia: Secondary | ICD-10-CM | POA: Diagnosis not present

## 2020-11-10 DIAGNOSIS — F01C0 Vascular dementia, severe, without behavioral disturbance, psychotic disturbance, mood disturbance, and anxiety: Secondary | ICD-10-CM

## 2020-11-10 NOTE — Progress Notes (Signed)
Location:  [facility] Room Number: 152 Place of Service:  SNF (31)   CODE STATUS: dnr   Allergies  Allergen Reactions   Hydrocodone-Acetaminophen Nausea And Vomiting    Chief Complaint  Patient presents with   Acute Visit    Care plan meeting     HPI:  We have come together for her care plan meeting. Marland Kitchen BIMS 4/15 mood 1/30 She is extensive assist to dependent with her adls. Can be aggressive with staff at  times.  She is non-ambulatory and dependent for locomotion. She is incontinent of bladder and bowel. She has had one fall without injury. Therapy none at this time.   Dietary: feeds self weight is stable on supplements mech soft diet with ground meats .   She continues to be followed for her chronic illnesses including: Chronic respiratory failure with hypoxia  Severe vascular dementia without behavioral disturbance ; psychotic disturbance; mood disturbance or anxiety Major depression recurrent chronic  Past Medical History:  Diagnosis Date   Anxiety    Dementia (HCC)    GERD (gastroesophageal reflux disease)    Goiter    Hypertension    Major depression, recurrent, chronic (HCC) 02/22/2019   Osteoarthritis    Osteoporosis    Vitamin D insufficiency     Past Surgical History:  Procedure Laterality Date   APPENDECTOMY     KIDNEY STONE SURGERY     ORIF HIP FRACTURE Right 11/20/2012   Procedure: OPEN REDUCTION INTERNAL FIXATION RIGHT HIP;  Surgeon: Darreld Mclean, MD;  Location: [facility];  Service: Orthopedics;  Laterality: Right;   stress fractures of legs     TUBAL LIGATION      Social History   Socioeconomic History   Marital status: Widowed    Spouse name: Not on file   Number of children: Not on file   Years of education: Not on file   Highest education level: Not on file  Occupational History   Occupation: retired  Tobacco Use   Smoking status: Never   Smokeless tobacco: Never  Vaping Use   Vaping Use: Never used  Substance and  Sexual Activity   Alcohol use: No   Drug use: No   Sexual activity: Not Currently  Other Topics Concern   Not on file  Social History Narrative   Long term resident of [facility]    Social Determinants of Health   Financial Resource Strain: Not on file  Food Insecurity: Not on file  Transportation Needs: Not on file  Physical Activity: Not on file  Stress: Not on file  Social Connections: Not on file  Intimate Partner Violence: Not on file   Family History  Problem Relation Age of Onset   Heart attack Father    Heart disease Neg Hx       VITAL SIGNS BP 135/75   Pulse 73   Temp (!) 97.3 F (36.3 C)   Ht 5' (1.524 m)   Wt 117 lb (53.1 kg)   BMI 22.85 kg/m   Outpatient Encounter Medications as of 11/10/2020  Medication Sig   acetaminophen (TYLENOL) 325 MG tablet Take 650 mg by mouth in the morning, at noon, and at bedtime.   alendronate (FOSAMAX) 70 MG tablet Take 70 mg by mouth once a week. Take with a full glass of water on an empty stomach.   Amino Acids-Protein Hydrolys (FEEDING SUPPLEMENT, PRO-STAT SUGAR FREE 64,) LIQD Take 30 mLs by mouth 3 (three) times daily with meals. 9  am, 12 pm, and 6 pm   calcium carbonate (TUMS EX) 750 MG chewable tablet Chew 1 tablet by mouth 3 (three) times daily.   Cholecalciferol 50 MCG (2000 UT) CAPS Take 1 capsule by mouth daily in the afternoon. Special Instructions: for supplement for osteoporosis   dextromethorphan-guaiFENesin (ROBITUSSIN-DM) 10-100 MG/5ML liquid Take 10 mLs by mouth every 6 (six) hours as needed for cough.   feeding supplement (ENSURE ENLIVE / ENSURE PLUS) LIQD Take 237 mLs by mouth 2 (two) times daily between meals.   loperamide (IMODIUM) 2 MG capsule Take 2 mg by mouth every 4 (four) hours as needed for diarrhea or loose stools.   losartan (COZAAR) 100 MG tablet Take 100 mg by mouth daily.   mirtazapine (REMERON) 15 MG tablet Take 7.5 mg by mouth at bedtime.   NON FORMULARY Diet:Mechanical soft diet with ground meat    omeprazole (PRILOSEC OTC) 20 MG tablet Take 20 mg by mouth daily.   OXYGEN Inhale 3 L into the lungs continuous.   No facility-administered encounter medications on file as of 11/10/2020.     SIGNIFICANT DIAGNOSTIC EXAMS   PREVIOUS  02-18-19: 2-d echo:   1. Left ventricular ejection fraction, by visual estimation, is 70 to 75%. The left ventricle has hyperdynamic function. There is no left ventricular hypertrophy.  2. Left ventricular diastolic parameters are indeterminate.  3. The left ventricle has no regional wall motion abnormalities.  4. Global right ventricle was not well visualized.The right ventricular size is not well visualized. Right vetricular wall thickness was not assessed.  5. Left atrial size was not well visualized.  6. Right atrial size was not well visualized.  7. The mitral valve was not well visualized. No evidence of mitral valve regurgitation. No evidence of mitral stenosis.  8. The tricuspid valve is not well visualized.  9. The aortic valve was not well visualized. Aortic valve regurgitation is not visualized. Technically difficult assement, grossly there is no significant aortic stenosis. 10. The pulmonic valve was not well visualized. Pulmonic valve regurgitation is not visualized. 11. The interatrial septum was not well visualized. 12. Technically difficult and limited study  12-16-19: DEXA: t score -5.678   NO NEW EXAMS.    LABS REVIEWED PREVIOUS    12-06-19; wbc 5.6; hgb 10.7; hct 34.2; mcv 996.9 plt 199; glucose 81; bun 17; creat 0.65; k+ 3.8; na++ 140; ca 8.7; liver normal albumin 3.0 tsh 0.491 free t3: 2.5 free t4: 0.74 06-01-20: wbc 5.5; hgb 11.0; hct 35.4;mcv 97.8 plt 197; glucose 87; bun 25; creat 0.65; k+ 4.1; na++ 141; ca 8.9; GFR>60; liver normal albumin 2.9 tsh 0.154  NO NEW LABS.   Review of Systems  Unable to perform ROS: Dementia (unable to participate)   Physical Exam Constitutional:      General: She is not in acute distress.     Appearance: She is well-developed. She is not diaphoretic.  Neck:     Thyroid: Thyroid mass present. No thyromegaly.     Comments:  3.1 cm thyroid nodule  Cardiovascular:     Rate and Rhythm: Normal rate and regular rhythm.     Pulses: Normal pulses.     Heart sounds: Normal heart sounds.  Pulmonary:     Effort: Pulmonary effort is normal. No respiratory distress.     Breath sounds: Normal breath sounds.     Comments: 02 Abdominal:     General: Bowel sounds are normal. There is no distension.     Palpations: Abdomen is  soft.     Tenderness: There is no abdominal tenderness.  Musculoskeletal:        General: Normal range of motion.     Cervical back: Neck supple.     Right lower leg: No edema.     Left lower leg: No edema.  Lymphadenopathy:     Cervical: No cervical adenopathy.  Skin:    General: Skin is warm and dry.  Neurological:     Mental Status: She is alert. Mental status is at baseline.  Psychiatric:        Mood and Affect: Mood normal.      ASSESSMENT/ PLAN:  TODAY  Chronic respiratory failure with hypoxia Severe vascular dementia without behavioral disturbance ; psychotic disturbance; mood disturbance or anxiety Major depression recurrent chronic  Will continue current medications Will continue current plan of care Will continue to monitor her status.   Time spent with patient: 40 minutes: medications plan of care    [name] NP [facility]  Contact [phone] Monday through Friday 8am- 5pm  After hours call [phone]

## 2020-11-28 ENCOUNTER — Encounter: Payer: Self-pay | Admitting: Internal Medicine

## 2020-11-28 ENCOUNTER — Non-Acute Institutional Stay (SKILLED_NURSING_FACILITY): Payer: Medicare Other | Admitting: Internal Medicine

## 2020-11-28 DIAGNOSIS — E440 Moderate protein-calorie malnutrition: Secondary | ICD-10-CM

## 2020-11-28 DIAGNOSIS — Z23 Encounter for immunization: Secondary | ICD-10-CM | POA: Diagnosis not present

## 2020-11-28 DIAGNOSIS — D649 Anemia, unspecified: Secondary | ICD-10-CM | POA: Diagnosis not present

## 2020-11-28 DIAGNOSIS — E049 Nontoxic goiter, unspecified: Secondary | ICD-10-CM | POA: Diagnosis not present

## 2020-11-28 DIAGNOSIS — N182 Chronic kidney disease, stage 2 (mild): Secondary | ICD-10-CM

## 2020-11-28 NOTE — Patient Instructions (Signed)
See assessment and plan under each diagnosis in the problem list and acutely for this visit 

## 2020-11-28 NOTE — Assessment & Plan Note (Addendum)
Total protein and albumin have improved and are now 6.2/3.2.  No change indicated in supplement.Dietician to continue to monitor

## 2020-11-28 NOTE — Assessment & Plan Note (Addendum)
Serial creatinine and GFR remain stable at 0.65 and greater than 60.  Med list reviewed; no indication for adjustment of ARB at this time.

## 2020-11-28 NOTE — Assessment & Plan Note (Addendum)
TSH serially has improved and now is low normal at 0.687.  The free T4 is minimally elevated at 1.15.  This will be monitored.

## 2020-11-28 NOTE — Assessment & Plan Note (Addendum)
Anemia has resolved with H/H rising from 11/35.4 to present values of 12.6/38.8.

## 2020-11-28 NOTE — Progress Notes (Signed)
NURSING HOME LOCATION:  [facility] ROOM NUMBER:  [id number]  CODE STATUS:  DNR  PCP:   [name] NP  This is a nursing facility follow up visit of chronic medical diagnoses & to document compliance with Regulation 483.30 (c) in The Long Term Care Survey Manual Phase 2 which mandates caregiver visit ( visits can alternate among physician, PA or NP as per statutes) within 10 days of 30 days / 60 days/ 90 days post admission to SNF date    Interim medical record and care since last SNF visit was updated with review of diagnostic studies and change in clinical status since last visit were documented.  HPI: She is a permanent resident of facility with medical diagnoses of GERD, essential hypertension, anxiety/depression, osteoporosis, vitamin D deficiency, and dementia. Current labs were reviewed.  Her CKD stage II is stable with a creatinine of 0.65 and GFR greater than 60.  Her protein/caloric malnutrition is improved with present total protein of 6.2 and albumin of 3.2.  Anemia has resolved as the H/H has risen from 11/35.4 up to 12.6/38.8.  TSH is low normal at 0.687 but serially this has improved.  She is not on a thyroid supplement.  Review of systems: Dementia invalidated responses.  She denied any active issues except for recent constipation.  She specifically denied any active cardiopulmonary symptoms despite her oxygen dependent status.  Constitutional: No fever, significant weight change, fatigue  Eyes: No redness, discharge, pain, vision change ENT/mouth: No nasal congestion,  purulent discharge, earache, change in hearing, sore throat  Cardiovascular: No chest pain, palpitations, paroxysmal nocturnal dyspnea, claudication, edema  Respiratory: No cough, sputum production, hemoptysis, DOE, significant snoring, apnea   Gastrointestinal: No heartburn, dysphagia, abdominal pain, nausea /vomiting, rectal bleeding, melena Genitourinary: No dysuria, hematuria, pyuria,  incontinence, nocturia Musculoskeletal: No joint stiffness, joint swelling, weakness, pain Dermatologic: No rash, pruritus, change in appearance of skin Neurologic: No dizziness, headache, syncope, seizures, numbness, tingling Psychiatric: No significant anxiety, depression, insomnia, anorexia Endocrine: No change in hair/skin/nails, excessive thirst, excessive hunger, excessive urination  Hematologic/lymphatic: No significant bruising, lymphadenopathy, abnormal bleeding Allergy/immunology: No itchy/watery eyes, significant sneezing, urticaria, angioedema  Physical exam:  Pertinent or positive findings: She appears her stated age and somewhat chronically ill.  Hair is disheveled.  Her voice is weak and "squeaky".  She is wearing nasal oxygen.  She is missing multiple anterior maxillary teeth.  The mandibular teeth are malaligned.  A large visible mid anterior neck structure is present suggesting goiter.  Heart sounds are markedly distant.  She has low-grade expiratory rhonchi bilaterally.  Abdomen is doughy.  She has trace pedal edema.  Pedal pulses were surprisingly good.  She has mixed DIP/PIP arthritic changes of the hands.  There is lateral deviation of the toes as well as contractures of the toes.  Opposition to strength is fair in the upper extremities and poor in the lower extremities.  General appearance: no acute distress, increased work of breathing is present.   Lymphatic: No lymphadenopathy about the head, neck, axilla. Eyes: No conjunctival inflammation or lid edema is present. There is no scleral icterus. Ears:  External ear exam shows no significant lesions or deformities.   Nose:  External nasal examination shows no deformity or inflammation. Nasal mucosa are pink and moist without lesions, exudates Heart:  No definite gallop, murmur, click, rub .  Lungs:  without wheezes,rales, rubs. Abdomen: Bowel sounds are normal. Abdomen is soft and nontender with no organomegaly, hernias,  masses.  GU: Deferred  Extremities:  No cyanosis, clubbing  Neurologic exam :Balance, Rhomberg, finger to nose testing could not be completed due to clinical state Skin: Warm & dry w/o tenting. No significant lesions or rash.  See summary under each active problem in the Problem List with associated updated therapeutic plan

## 2020-11-29 DIAGNOSIS — Z1159 Encounter for screening for other viral diseases: Secondary | ICD-10-CM | POA: Diagnosis not present

## 2020-11-29 DIAGNOSIS — J9601 Acute respiratory failure with hypoxia: Secondary | ICD-10-CM | POA: Diagnosis not present

## 2020-12-17 DIAGNOSIS — R1311 Dysphagia, oral phase: Secondary | ICD-10-CM | POA: Diagnosis not present

## 2020-12-17 DIAGNOSIS — J9611 Chronic respiratory failure with hypoxia: Secondary | ICD-10-CM | POA: Diagnosis not present

## 2020-12-17 DIAGNOSIS — G9341 Metabolic encephalopathy: Secondary | ICD-10-CM | POA: Diagnosis not present

## 2020-12-18 DIAGNOSIS — G9341 Metabolic encephalopathy: Secondary | ICD-10-CM | POA: Diagnosis not present

## 2020-12-18 DIAGNOSIS — R1311 Dysphagia, oral phase: Secondary | ICD-10-CM | POA: Diagnosis not present

## 2020-12-18 DIAGNOSIS — J9611 Chronic respiratory failure with hypoxia: Secondary | ICD-10-CM | POA: Diagnosis not present

## 2020-12-19 DIAGNOSIS — G9341 Metabolic encephalopathy: Secondary | ICD-10-CM | POA: Diagnosis not present

## 2020-12-19 DIAGNOSIS — J9611 Chronic respiratory failure with hypoxia: Secondary | ICD-10-CM | POA: Diagnosis not present

## 2020-12-19 DIAGNOSIS — R1311 Dysphagia, oral phase: Secondary | ICD-10-CM | POA: Diagnosis not present

## 2020-12-23 DIAGNOSIS — G9341 Metabolic encephalopathy: Secondary | ICD-10-CM | POA: Diagnosis not present

## 2020-12-23 DIAGNOSIS — R1311 Dysphagia, oral phase: Secondary | ICD-10-CM | POA: Diagnosis not present

## 2020-12-23 DIAGNOSIS — J9611 Chronic respiratory failure with hypoxia: Secondary | ICD-10-CM | POA: Diagnosis not present

## 2020-12-24 DIAGNOSIS — G9341 Metabolic encephalopathy: Secondary | ICD-10-CM | POA: Diagnosis not present

## 2020-12-24 DIAGNOSIS — R1311 Dysphagia, oral phase: Secondary | ICD-10-CM | POA: Diagnosis not present

## 2020-12-24 DIAGNOSIS — J9611 Chronic respiratory failure with hypoxia: Secondary | ICD-10-CM | POA: Diagnosis not present

## 2020-12-25 ENCOUNTER — Encounter: Payer: Self-pay | Admitting: Adult Health

## 2020-12-25 ENCOUNTER — Non-Acute Institutional Stay (SKILLED_NURSING_FACILITY): Payer: Medicare Other | Admitting: Adult Health

## 2020-12-25 DIAGNOSIS — E049 Nontoxic goiter, unspecified: Secondary | ICD-10-CM

## 2020-12-25 DIAGNOSIS — F01C0 Vascular dementia, severe, without behavioral disturbance, psychotic disturbance, mood disturbance, and anxiety: Secondary | ICD-10-CM | POA: Diagnosis not present

## 2020-12-25 DIAGNOSIS — M810 Age-related osteoporosis without current pathological fracture: Secondary | ICD-10-CM | POA: Diagnosis not present

## 2020-12-25 DIAGNOSIS — M159 Polyosteoarthritis, unspecified: Secondary | ICD-10-CM | POA: Diagnosis not present

## 2020-12-25 DIAGNOSIS — G9341 Metabolic encephalopathy: Secondary | ICD-10-CM | POA: Diagnosis not present

## 2020-12-25 DIAGNOSIS — R1311 Dysphagia, oral phase: Secondary | ICD-10-CM | POA: Diagnosis not present

## 2020-12-25 DIAGNOSIS — J9611 Chronic respiratory failure with hypoxia: Secondary | ICD-10-CM | POA: Diagnosis not present

## 2020-12-25 NOTE — Progress Notes (Signed)
Location:  [facility] Room Number: 122 Place of Service:  SNF (31) Provider: [name], NP   CODE STATUS: DNR  Allergies  Allergen Reactions   Hydrocodone-Acetaminophen Nausea And Vomiting    Chief Complaint  Patient presents with   Medical Management of Chronic Issues               Vascular dementia without behavioral disturbance:     Primary osteoarthritis multiple joints:    Postmenopausal osteoporosis:  Goiter:    HPI:  She is a 87 year old long term resident of this facility being seen for the management of her chronic illnesses: Vascular dementia without behavioral disturbance:     Primary osteoarthritis multiple joints:    Postmenopausal osteoporosis:  Goiter:. There are no reports of anxiety or agitation. She has experienced some weight loss and was restarted on remeron.   Past Medical History:  Diagnosis Date   Anxiety    Dementia (HCC)    GERD (gastroesophageal reflux disease)    Goiter    Hypertension    Major depression, recurrent, chronic (HCC) 02/22/2019   Osteoarthritis    Osteoporosis    Vitamin D insufficiency     Past Surgical History:  Procedure Laterality Date   APPENDECTOMY     KIDNEY STONE SURGERY     ORIF HIP FRACTURE Right 11/20/2012   Procedure: OPEN REDUCTION INTERNAL FIXATION RIGHT HIP;  Surgeon: Darreld Mclean, MD;  Location: [facility];  Service: Orthopedics;  Laterality: Right;   stress fractures of legs     TUBAL LIGATION      Social History   Socioeconomic History   Marital status: Widowed    Spouse name: Not on file   Number of children: Not on file   Years of education: Not on file   Highest education level: Not on file  Occupational History   Occupation: retired  Tobacco Use   Smoking status: Never   Smokeless tobacco: Never  Vaping Use   Vaping Use: Never used  Substance and Sexual Activity   Alcohol use: No   Drug use: No   Sexual activity: Not Currently  Other Topics Concern   Not on file   Social History Narrative   Long term resident of [facility]    Social Determinants of Health   Financial Resource Strain: Not on file  Food Insecurity: Not on file  Transportation Needs: Not on file  Physical Activity: Not on file  Stress: Not on file  Social Connections: Not on file  Intimate Partner Violence: Not on file   Family History  Problem Relation Age of Onset   Heart attack Father    Heart disease Neg Hx       VITAL SIGNS BP 108/70   Pulse 93   Temp 98 F (36.7 C)   Ht 5' (1.524 m)   Wt 113 lb 12.8 oz (51.6 kg)   SpO2 95%   BMI 22.23 kg/m   Outpatient Encounter Medications as of 12/25/2020  Medication Sig   acetaminophen (TYLENOL) 325 MG tablet Take 650 mg by mouth in the morning, at noon, and at bedtime.   alendronate (FOSAMAX) 70 MG tablet Take 70 mg by mouth once a week. Take with a full glass of water on an empty stomach.   Amino Acids-Protein Hydrolys (FEEDING SUPPLEMENT, PRO-STAT SUGAR FREE 64,) LIQD Take 30 mLs by mouth 3 (three) times daily with meals. 9 am, 12 pm, and 6 pm   calcium carbonate (TUMS EX)  750 MG chewable tablet Chew 1 tablet by mouth 3 (three) times daily.   Cholecalciferol 50 MCG (2000 UT) CAPS Take 1 capsule by mouth daily in the afternoon. Special Instructions: for supplement for osteoporosis   dextromethorphan-guaiFENesin (ROBITUSSIN-DM) 10-100 MG/5ML liquid Take 10 mLs by mouth every 6 (six) hours as needed for cough.   feeding supplement (ENSURE ENLIVE / ENSURE PLUS) LIQD Take 237 mLs by mouth 2 (two) times daily between meals.   loperamide (IMODIUM) 2 MG capsule Take 2 mg by mouth every 4 (four) hours as needed for diarrhea or loose stools.   losartan (COZAAR) 100 MG tablet Take 100 mg by mouth daily.   mirtazapine (REMERON) 7.5 MG tablet Take 7.5 mg by mouth at bedtime.   NON FORMULARY Diet:Mechanical soft diet with ground meat   omeprazole (PRILOSEC OTC) 20 MG tablet Take 20 mg by mouth daily.   OXYGEN Inhale 3 L into the lungs  continuous.   [DISCONTINUED] mirtazapine (REMERON) 15 MG tablet Take 7.5 mg by mouth at bedtime.   No facility-administered encounter medications on file as of 12/25/2020.     SIGNIFICANT DIAGNOSTIC EXAMS   PREVIOUS  02-18-19: 2-d echo:   1. Left ventricular ejection fraction, by visual estimation, is 70 to 75%. The left ventricle has hyperdynamic function. There is no left ventricular hypertrophy.  2. Left ventricular diastolic parameters are indeterminate.  3. The left ventricle has no regional wall motion abnormalities.  4. Global right ventricle was not well visualized.The right ventricular size is not well visualized. Right vetricular wall thickness was not assessed.  5. Left atrial size was not well visualized.  6. Right atrial size was not well visualized.  7. The mitral valve was not well visualized. No evidence of mitral valve regurgitation. No evidence of mitral stenosis.  8. The tricuspid valve is not well visualized.  9. The aortic valve was not well visualized. Aortic valve regurgitation is not visualized. Technically difficult assement, grossly there is no significant aortic stenosis. 10. The pulmonic valve was not well visualized. Pulmonic valve regurgitation is not visualized. 11. The interatrial septum was not well visualized. 12. Technically difficult and limited study  12-16-19: DEXA: t score -5.678   NO NEW EXAMS.    LABS REVIEWED PREVIOUS    12-06-19; wbc 5.6; hgb 10.7; hct 34.2; mcv 996.9 plt 199; glucose 81; bun 17; creat 0.65; k+ 3.8; na++ 140; ca 8.7; liver normal albumin 3.0 tsh 0.491 free t3: 2.5 free t4: 0.74 06-01-20: wbc 5.5; hgb 11.0; hct 35.4;mcv 97.8 plt 197; glucose 87; bun 25; creat 0.65; k+ 4.1; na++ 141; ca 8.9; GFR>60; liver normal albumin 2.9 tsh 0.154  TODAY  11-09-20: wbc 7.0; hgb 12.6; hct 38.8; mcv 96.3 plt 204; glucose 98; bun 14; creat 0.65; k+ 3.7; na++ 140; ca 8.5; GFR >60; liver normal albumin 3.2; tsh 0.687 free t4: 1.15   Review of  Systems  Unable to perform ROS: Dementia (unable to participate)   Physical Exam Constitutional:      General: She is not in acute distress.    Appearance: She is well-developed. She is not diaphoretic.  Neck:     Comments:  3.1 cm thyroid nodule Cardiovascular:     Rate and Rhythm: Normal rate and regular rhythm.     Heart sounds: Normal heart sounds.  Pulmonary:     Effort: Pulmonary effort is normal. No respiratory distress.     Breath sounds: Normal breath sounds.     Comments: 02 Abdominal:  General: Bowel sounds are normal. There is no distension.     Palpations: Abdomen is soft.     Tenderness: There is no abdominal tenderness.  Musculoskeletal:        General: Normal range of motion.     Cervical back: Neck supple.     Right lower leg: No edema.     Left lower leg: No edema.     Comments: kyphosis  Lymphadenopathy:     Cervical: No cervical adenopathy.  Skin:    General: Skin is warm and dry.  Neurological:     Mental Status: She is alert. Mental status is at baseline.  Psychiatric:        Mood and Affect: Mood normal.    ASSESSMENT/ PLAN:  TODAY  Vascular dementia without behavioral disturbance: she has had weight loss to 113 pounds. Is off aricept will continue remeron 7.5 mg nightly to help maintain appetite   2. Primary osteoarthritis multiple joints: is stable will continue tylenol 1 gm three times daily   3. Postmenopausal osteoporosis: t score -5.678 will continue fosamax 70 mg weekly  4. Goiter: tsh 0.687 free t4: 1.15   PREVIOUS   5. Essential hypertension: is stable b/p 108/70 will continue cozaar 100 mg daily   6. Severe protein calorie malnutrition: is stable albumin 3.2 will continue supplements as directed   7. Aortic atherosclerosis (ct 02-17-19) will monitor   8. Major depression chronic: is stable will continue remeron 7.5 mg nightly has failed coming off this medication for her appetite is off lexapro  9. GERD without esophagitis:  is stable has history of GI bleed will continue mexium 40 mg daily         [name] NP [facility]  Contact [phone] Monday through Friday 8am- 5pm  After hours call [phone]

## 2020-12-26 DIAGNOSIS — R1311 Dysphagia, oral phase: Secondary | ICD-10-CM | POA: Diagnosis not present

## 2020-12-26 DIAGNOSIS — G9341 Metabolic encephalopathy: Secondary | ICD-10-CM | POA: Diagnosis not present

## 2020-12-26 DIAGNOSIS — J9611 Chronic respiratory failure with hypoxia: Secondary | ICD-10-CM | POA: Diagnosis not present

## 2020-12-27 DIAGNOSIS — J9611 Chronic respiratory failure with hypoxia: Secondary | ICD-10-CM | POA: Diagnosis not present

## 2020-12-27 DIAGNOSIS — R1311 Dysphagia, oral phase: Secondary | ICD-10-CM | POA: Diagnosis not present

## 2020-12-27 DIAGNOSIS — G9341 Metabolic encephalopathy: Secondary | ICD-10-CM | POA: Diagnosis not present

## 2020-12-30 DIAGNOSIS — G9341 Metabolic encephalopathy: Secondary | ICD-10-CM | POA: Diagnosis not present

## 2020-12-30 DIAGNOSIS — R1311 Dysphagia, oral phase: Secondary | ICD-10-CM | POA: Diagnosis not present

## 2020-12-30 DIAGNOSIS — J9611 Chronic respiratory failure with hypoxia: Secondary | ICD-10-CM | POA: Diagnosis not present

## 2020-12-31 DIAGNOSIS — G9341 Metabolic encephalopathy: Secondary | ICD-10-CM | POA: Diagnosis not present

## 2020-12-31 DIAGNOSIS — R1311 Dysphagia, oral phase: Secondary | ICD-10-CM | POA: Diagnosis not present

## 2020-12-31 DIAGNOSIS — J9611 Chronic respiratory failure with hypoxia: Secondary | ICD-10-CM | POA: Diagnosis not present

## 2021-01-01 DIAGNOSIS — J9611 Chronic respiratory failure with hypoxia: Secondary | ICD-10-CM | POA: Diagnosis not present

## 2021-01-01 DIAGNOSIS — G9341 Metabolic encephalopathy: Secondary | ICD-10-CM | POA: Diagnosis not present

## 2021-01-01 DIAGNOSIS — R1311 Dysphagia, oral phase: Secondary | ICD-10-CM | POA: Diagnosis not present

## 2021-01-04 DIAGNOSIS — J9611 Chronic respiratory failure with hypoxia: Secondary | ICD-10-CM | POA: Diagnosis not present

## 2021-01-04 DIAGNOSIS — R1311 Dysphagia, oral phase: Secondary | ICD-10-CM | POA: Diagnosis not present

## 2021-01-04 DIAGNOSIS — G9341 Metabolic encephalopathy: Secondary | ICD-10-CM | POA: Diagnosis not present

## 2021-01-05 DIAGNOSIS — R1311 Dysphagia, oral phase: Secondary | ICD-10-CM | POA: Diagnosis not present

## 2021-01-05 DIAGNOSIS — G9341 Metabolic encephalopathy: Secondary | ICD-10-CM | POA: Diagnosis not present

## 2021-01-05 DIAGNOSIS — J9611 Chronic respiratory failure with hypoxia: Secondary | ICD-10-CM | POA: Diagnosis not present

## 2021-01-10 DIAGNOSIS — H524 Presbyopia: Secondary | ICD-10-CM | POA: Diagnosis not present

## 2021-01-10 DIAGNOSIS — H43813 Vitreous degeneration, bilateral: Secondary | ICD-10-CM | POA: Diagnosis not present

## 2021-01-10 DIAGNOSIS — H2513 Age-related nuclear cataract, bilateral: Secondary | ICD-10-CM | POA: Diagnosis not present

## 2021-01-16 DIAGNOSIS — L602 Onychogryphosis: Secondary | ICD-10-CM | POA: Diagnosis not present

## 2021-01-16 DIAGNOSIS — I739 Peripheral vascular disease, unspecified: Secondary | ICD-10-CM | POA: Diagnosis not present

## 2021-01-22 ENCOUNTER — Non-Acute Institutional Stay (SKILLED_NURSING_FACILITY): Payer: Medicare Other | Admitting: Adult Health

## 2021-01-22 ENCOUNTER — Encounter: Payer: Self-pay | Admitting: Adult Health

## 2021-01-22 DIAGNOSIS — I10 Essential (primary) hypertension: Secondary | ICD-10-CM

## 2021-01-22 DIAGNOSIS — I700 Atherosclerosis of aorta: Secondary | ICD-10-CM | POA: Diagnosis not present

## 2021-01-22 DIAGNOSIS — F339 Major depressive disorder, recurrent, unspecified: Secondary | ICD-10-CM | POA: Diagnosis not present

## 2021-01-22 DIAGNOSIS — E440 Moderate protein-calorie malnutrition: Secondary | ICD-10-CM

## 2021-01-22 NOTE — Progress Notes (Signed)
Location:  [facility] Room Number: 122-W Place of Service:  SNF (31)   CODE STATUS: DNR  Allergies  Allergen Reactions   Hydrocodone-Acetaminophen Nausea And Vomiting    Chief Complaint  Patient presents with   Medical Management of Chronic Issues             Essential hypertension:  Severe protein calorie malnutrition  Aortic atherosclerosis  Major depression chronic    HPI:  She is a 87 year old long term resident of this facility being seen for the management of her chronic illnesses: Essential hypertension:  Severe protein calorie malnutrition  Aortic atherosclerosis  Major depression chronic. There are no reports of changes in appetite; weight is stable. No reports of uncontrolled pain.   Past Medical History:  Diagnosis Date   Anxiety    Dementia (HCC)    GERD (gastroesophageal reflux disease)    Goiter    Hypertension    Major depression, recurrent, chronic (HCC) 02/22/2019   Osteoarthritis    Osteoporosis    Vitamin D insufficiency     Past Surgical History:  Procedure Laterality Date   APPENDECTOMY     KIDNEY STONE SURGERY     ORIF HIP FRACTURE Right 11/20/2012   Procedure: OPEN REDUCTION INTERNAL FIXATION RIGHT HIP;  Surgeon: Darreld Mclean, MD;  Location: [facility];  Service: Orthopedics;  Laterality: Right;   stress fractures of legs     TUBAL LIGATION      Social History   Socioeconomic History   Marital status: Widowed    Spouse name: Not on file   Number of children: Not on file   Years of education: Not on file   Highest education level: Not on file  Occupational History   Occupation: retired  Tobacco Use   Smoking status: Never   Smokeless tobacco: Never  Vaping Use   Vaping Use: Never used  Substance and Sexual Activity   Alcohol use: No   Drug use: No   Sexual activity: Not Currently  Other Topics Concern   Not on file  Social History Narrative   Long term resident of [facility]    Social Determinants of Health    Financial Resource Strain: Not on file  Food Insecurity: Not on file  Transportation Needs: Not on file  Physical Activity: Not on file  Stress: Not on file  Social Connections: Not on file  Intimate Partner Violence: Not on file   Family History  Problem Relation Age of Onset   Heart attack Father    Heart disease Neg Hx       VITAL SIGNS BP (!) 115/53    Pulse 89    Temp 97.6 F (36.4 C)    Resp 18    Ht 5' (1.524 m)    Wt 113 lb 9.6 oz (51.5 kg)    SpO2 99%    BMI 22.19 kg/m   Outpatient Encounter Medications as of 01/22/2021  Medication Sig   acetaminophen (TYLENOL) 325 MG tablet Take 650 mg by mouth in the morning, at noon, and at bedtime.   alendronate (FOSAMAX) 70 MG tablet Take 70 mg by mouth once a week. Take with a full glass of water on an empty stomach.   Amino Acids-Protein Hydrolys (FEEDING SUPPLEMENT, PRO-STAT SUGAR FREE 64,) LIQD Take 30 mLs by mouth 3 (three) times daily with meals. 9 am, 12 pm, and 6 pm   calcium carbonate (TUMS EX) 750 MG chewable tablet Chew 1 tablet by mouth  3 (three) times daily.   Cholecalciferol 50 MCG (2000 UT) CAPS Take 1 capsule by mouth daily in the afternoon. Special Instructions: for supplement for osteoporosis   dextromethorphan-guaiFENesin (ROBITUSSIN-DM) 10-100 MG/5ML liquid Take 10 mLs by mouth every 6 (six) hours as needed for cough.   feeding supplement (ENSURE ENLIVE / ENSURE PLUS) LIQD Take 237 mLs by mouth 2 (two) times daily between meals.   loperamide (IMODIUM) 2 MG capsule Take 2 mg by mouth every 4 (four) hours as needed for diarrhea or loose stools.   losartan (COZAAR) 100 MG tablet Take 100 mg by mouth daily.   mirtazapine (REMERON) 7.5 MG tablet Take 7.5 mg by mouth at bedtime.   NON FORMULARY Diet:Dysphagia 3 (mechanical soft), thin liquids. OK to have soft sandwiches per LPN request.   omeprazole (PRILOSEC OTC) 20 MG tablet Take 20 mg by mouth daily.   OXYGEN Inhale 3 L into the lungs continuous.   No  facility-administered encounter medications on file as of 01/22/2021.     SIGNIFICANT DIAGNOSTIC EXAMS   PREVIOUS  02-18-19: 2-d echo:   1. Left ventricular ejection fraction, by visual estimation, is 70 to 75%. The left ventricle has hyperdynamic function. There is no left ventricular hypertrophy.  2. Left ventricular diastolic parameters are indeterminate.  3. The left ventricle has no regional wall motion abnormalities.  4. Global right ventricle was not well visualized.The right ventricular size is not well visualized. Right vetricular wall thickness was not assessed.  5. Left atrial size was not well visualized.  6. Right atrial size was not well visualized.  7. The mitral valve was not well visualized. No evidence of mitral valve regurgitation. No evidence of mitral stenosis.  8. The tricuspid valve is not well visualized.  9. The aortic valve was not well visualized. Aortic valve regurgitation is not visualized. Technically difficult assement, grossly there is no significant aortic stenosis. 10. The pulmonic valve was not well visualized. Pulmonic valve regurgitation is not visualized. 11. The interatrial septum was not well visualized. 12. Technically difficult and limited study  12-16-19: DEXA: t score -5.678   NO NEW EXAMS.    LABS REVIEWED PREVIOUS    06-01-20: wbc 5.5; hgb 11.0; hct 35.4;mcv 97.8 plt 197; glucose 87; bun 25; creat 0.65; k+ 4.1; na++ 141; ca 8.9; GFR>60; liver normal albumin 2.9 tsh 0.154 11-09-20: wbc 7.0; hgb 12.6; hct 38.8; mcv 96.3 plt 204; glucose 98; bun 14; creat 0.65; k+ 3.7; na++ 140; ca 8.5; GFR >60; liver normal albumin 3.2; tsh 0.687 free t4: 1.15  NO NEW LABS.   Review of Systems  Unable to perform ROS: Dementia   Physical Exam Constitutional:      General: She is not in acute distress.    Appearance: She is well-developed. She is not diaphoretic.  Neck:     Thyroid: Thyromegaly present.     Comments: 3.1 thyroid nodule   Cardiovascular:     Rate and Rhythm: Normal rate and regular rhythm.     Pulses: Normal pulses.     Heart sounds: Normal heart sounds.  Pulmonary:     Effort: Pulmonary effort is normal. No respiratory distress.     Breath sounds: Normal breath sounds.     Comments: 02 Abdominal:     General: Bowel sounds are normal. There is no distension.     Palpations: Abdomen is soft.     Tenderness: There is no abdominal tenderness.  Musculoskeletal:        General: Normal  range of motion.     Cervical back: Neck supple.     Right lower leg: No edema.     Left lower leg: No edema.     Comments: Kyphosis   Lymphadenopathy:     Cervical: No cervical adenopathy.  Skin:    General: Skin is warm and dry.  Neurological:     Mental Status: She is alert. Mental status is at baseline.  Psychiatric:        Mood and Affect: Mood normal.    ASSESSMENT/ PLAN:  TODAY  Essential hypertension: is stable b/p 115/53 will continue cozaar 100 mg daily  2. Severe protein calorie malnutrition is stable albumin 3.2 will continue supplements as directed  3. Aortic atherosclerosis (ct 02-17-19) will monitor   4. Major depression chronic: is stable will continue remeron 7.5 mg nightly (failed wean X2) for appetite.    PREVIOUS   5. GERD without esophagitis: is stable has history of GI bleed will continue mexium 40 mg daily   6. Vascular dementia without behavioral disturbance: she has had weight loss to 113 pounds. Is off aricept will continue remeron 7.5 mg nightly to help maintain appetite   7. Primary osteoarthritis multiple joints: is stable will continue tylenol 1 gm three times daily   8. Postmenopausal osteoporosis: t score -5.678 will continue fosamax 70 mg weekly  9. Goiter: tsh 0.687 free t4: 1.15     [name] NP [facility]  Contact [phone] Monday through Friday 8am- 5pm  After hours call [phone]

## 2021-02-08 DIAGNOSIS — Z1159 Encounter for screening for other viral diseases: Secondary | ICD-10-CM | POA: Diagnosis not present

## 2021-02-08 DIAGNOSIS — J9601 Acute respiratory failure with hypoxia: Secondary | ICD-10-CM | POA: Diagnosis not present

## 2021-02-09 ENCOUNTER — Non-Acute Institutional Stay (SKILLED_NURSING_FACILITY): Payer: Medicare Other | Admitting: Adult Health

## 2021-02-09 ENCOUNTER — Encounter: Payer: Self-pay | Admitting: Adult Health

## 2021-02-09 DIAGNOSIS — F01C0 Vascular dementia, severe, without behavioral disturbance, psychotic disturbance, mood disturbance, and anxiety: Secondary | ICD-10-CM

## 2021-02-09 DIAGNOSIS — I700 Atherosclerosis of aorta: Secondary | ICD-10-CM

## 2021-02-09 DIAGNOSIS — J9611 Chronic respiratory failure with hypoxia: Secondary | ICD-10-CM | POA: Diagnosis not present

## 2021-02-09 NOTE — Progress Notes (Signed)
Location:  [facility] Room Number: 122-W Place of Service:  SNF (31)   CODE STATUS: DNR  Allergies  Allergen Reactions   Hydrocodone-Acetaminophen Nausea And Vomiting    Chief Complaint  Patient presents with   Acute Visit    Care plan meeting    HPI:  We have come together for her care plan meeting. BIMS none; mood none: repeats decreased interest and pleasure in doing things. She is nonambulatory. Is out of bed daily. She is extensive to dependent with her adls. She is incontinent of bladder and bowel. There have been no falls. Dietary: feeds self; on D3 thin liquids.does not drink ensure will stop this.  Therapy none at this time. She continues to be followed for her chronic illnesses:   Aortic atherosclerosis   Chronic respiratory failure with hypoxia Severe vascular dementia without behavioral disturbance psychotic disturbance mood disturbance or anxiety.  Past Medical History:  Diagnosis Date   Anxiety    Dementia (HCC)    GERD (gastroesophageal reflux disease)    Goiter    Hypertension    Major depression, recurrent, chronic (HCC) 02/22/2019   Osteoarthritis    Osteoporosis    Vitamin D insufficiency     Past Surgical History:  Procedure Laterality Date   APPENDECTOMY     KIDNEY STONE SURGERY     ORIF HIP FRACTURE Right 11/20/2012   Procedure: OPEN REDUCTION INTERNAL FIXATION RIGHT HIP;  Surgeon: Darreld Mclean, MD;  Location: [facility];  Service: Orthopedics;  Laterality: Right;   stress fractures of legs     TUBAL LIGATION      Social History   Socioeconomic History   Marital status: Widowed    Spouse name: Not on file   Number of children: Not on file   Years of education: Not on file   Highest education level: Not on file  Occupational History   Occupation: retired  Tobacco Use   Smoking status: Never   Smokeless tobacco: Never  Vaping Use   Vaping Use: Never used  Substance and Sexual Activity   Alcohol use: No   Drug use:  No   Sexual activity: Not Currently  Other Topics Concern   Not on file  Social History Narrative   Long term resident of [facility]    Social Determinants of Health   Financial Resource Strain: Not on file  Food Insecurity: Not on file  Transportation Needs: Not on file  Physical Activity: Not on file  Stress: Not on file  Social Connections: Not on file  Intimate Partner Violence: Not on file   Family History  Problem Relation Age of Onset   Heart attack Father    Heart disease Neg Hx       VITAL SIGNS BP 128/78    Pulse 66    Temp 97.7 F (36.5 C)    Resp 18    Ht 5' (1.524 m)    Wt 116 lb 9.6 oz (52.9 kg)    SpO2 95%    BMI 22.77 kg/m   Outpatient Encounter Medications as of 02/09/2021  Medication Sig   acetaminophen (TYLENOL) 325 MG tablet Take 650 mg by mouth in the morning, at noon, and at bedtime.   alendronate (FOSAMAX) 70 MG tablet Take 70 mg by mouth once a week. Take with a full glass of water on an empty stomach.   Amino Acids-Protein Hydrolys (FEEDING SUPPLEMENT, PRO-STAT SUGAR FREE 64,) LIQD Take 30 mLs by mouth 3 (three) times daily  with meals. 9 am, 12 pm, and 6 pm   calcium carbonate (TUMS EX) 750 MG chewable tablet Chew 1 tablet by mouth 3 (three) times daily.   Cholecalciferol 50 MCG (2000 UT) CAPS Take 1 capsule by mouth daily in the afternoon. Special Instructions: for supplement for osteoporosis   dextromethorphan-guaiFENesin (ROBITUSSIN-DM) 10-100 MG/5ML liquid Take 10 mLs by mouth every 6 (six) hours as needed for cough.   feeding supplement (ENSURE ENLIVE / ENSURE PLUS) LIQD Take 237 mLs by mouth 2 (two) times daily between meals.   loperamide (IMODIUM) 2 MG capsule Take 2 mg by mouth every 4 (four) hours as needed for diarrhea or loose stools.   losartan (COZAAR) 100 MG tablet Take 100 mg by mouth daily.   mirtazapine (REMERON) 7.5 MG tablet Take 7.5 mg by mouth at bedtime.   NON FORMULARY Diet:Dysphagia 3 (mechanical soft), thin liquids. OK to have soft  sandwiches per LPN request.   omeprazole (PRILOSEC OTC) 20 MG tablet Take 20 mg by mouth daily.   OXYGEN Inhale 3 L into the lungs continuous.   No facility-administered encounter medications on file as of 02/09/2021.     SIGNIFICANT DIAGNOSTIC EXAMS  PREVIOUS 12-16-19: DEXA: t score -5.678   NO NEW EXAMS.    LABS REVIEWED PREVIOUS    06-01-20: wbc 5.5; hgb 11.0; hct 35.4;mcv 97.8 plt 197; glucose 87; bun 25; creat 0.65; k+ 4.1; na++ 141; ca 8.9; GFR>60; liver normal albumin 2.9 tsh 0.154 11-09-20: wbc 7.0; hgb 12.6; hct 38.8; mcv 96.3 plt 204; glucose 98; bun 14; creat 0.65; k+ 3.7; na++ 140; ca 8.5; GFR >60; liver normal albumin 3.2; tsh 0.687 free t4: 1.15  NO NEW LABS.   Review of Systems  Unable to perform ROS: Dementia   Physical Exam Constitutional:      General: She is not in acute distress.    Appearance: She is well-developed. She is not diaphoretic.  Neck:     Thyroid: Thyromegaly present.     Comments: 3.1 thyroid nodule  Cardiovascular:     Rate and Rhythm: Normal rate and regular rhythm.     Heart sounds: Normal heart sounds.  Pulmonary:     Effort: Pulmonary effort is normal. No respiratory distress.     Breath sounds: Normal breath sounds.     Comments: 02 Abdominal:     General: Bowel sounds are normal. There is no distension.     Palpations: Abdomen is soft.     Tenderness: There is no abdominal tenderness.  Musculoskeletal:        General: Normal range of motion.     Cervical back: Neck supple.     Right lower leg: No edema.     Left lower leg: No edema.  Lymphadenopathy:     Cervical: No cervical adenopathy.  Skin:    General: Skin is warm and dry.  Neurological:     Mental Status: She is alert. Mental status is at baseline.  Psychiatric:        Mood and Affect: Mood normal.      ASSESSMENT/ PLAN:  TODAY  Aortic atherosclerosis Chronic respiratory failure with hypoxia  Severe vascular dementia without behavioral disturbance psychotic  disturbance mood disturbance or anxiety.    Will continue current medications Will continue current plan of care Will continue to monitor her status.    Time spent with patient: 40 minutes: care plan medications.    [name] NP [facility]  call [phone]

## 2021-02-15 DIAGNOSIS — Z1159 Encounter for screening for other viral diseases: Secondary | ICD-10-CM | POA: Diagnosis not present

## 2021-02-15 DIAGNOSIS — J9601 Acute respiratory failure with hypoxia: Secondary | ICD-10-CM | POA: Diagnosis not present

## 2021-02-19 ENCOUNTER — Non-Acute Institutional Stay (SKILLED_NURSING_FACILITY): Payer: Medicare Other | Admitting: Internal Medicine

## 2021-02-19 ENCOUNTER — Encounter: Payer: Self-pay | Admitting: Internal Medicine

## 2021-02-19 DIAGNOSIS — F339 Major depressive disorder, recurrent, unspecified: Secondary | ICD-10-CM

## 2021-02-19 DIAGNOSIS — E049 Nontoxic goiter, unspecified: Secondary | ICD-10-CM

## 2021-02-19 DIAGNOSIS — N182 Chronic kidney disease, stage 2 (mild): Secondary | ICD-10-CM

## 2021-02-19 DIAGNOSIS — E440 Moderate protein-calorie malnutrition: Secondary | ICD-10-CM

## 2021-02-19 DIAGNOSIS — I209 Angina pectoris, unspecified: Secondary | ICD-10-CM | POA: Diagnosis not present

## 2021-02-19 NOTE — Assessment & Plan Note (Addendum)
Albumin is 3.2 and total protein 6.2 indicating mild to moderate protein/caloric malnutrition.  She is thin and suboptimally nourished.  Limb atrophy is present. Ensure supplement.

## 2021-02-19 NOTE — Assessment & Plan Note (Signed)
Clinically the goiter is unchanged.  The TFTs from October suggest possible subclinical hyperthyroidism.  Update appropriate in the next 2-3 months.

## 2021-02-19 NOTE — Assessment & Plan Note (Signed)
Clinically she is pleasantly demented.  When she does respond it is not focused but typically followed by a burst of giggling.

## 2021-02-19 NOTE — Assessment & Plan Note (Signed)
Medication list reviewed; no change indicated unless there is significant progression of CKD.

## 2021-02-19 NOTE — Assessment & Plan Note (Signed)
The patient is pleasantly demented and can provide no meaningful history.  She denies any chest pain.

## 2021-02-19 NOTE — Progress Notes (Signed)
° °  NURSING HOME LOCATION:  [facility] ROOM NUMBER:  [id number] W  CODE STATUS:  DNR  PCP:  Ok Edwards NP,PSC  This is a nursing facility follow up visit of chronic medical diagnoses & to document compliance with Regulation 483.30 (c) in [employer] Manual Phase 2 which mandates caregiver visit ( visits can alternate among physician, PA or NP as per statutes) within 10 days of 30 days / 60 days/ 90 days post admission to SNF date    Interim medical record and care since last SNF visit was updated with review of diagnostic studies and change in clinical status since last visit were documented.  HPI: She is a permanent resident of the facility with medical diagnoses of GERD, goiter, essential hypertension, anxiety/recurrent depression, osteoporosis, vitamin D deficiency, and dementia.  Labs are current.  Creatinine is 0.65 with GFR greater than 60 indicating CKD stage II.  TSH was low therapeutic at 0.687 with a free T4 of 1.15 with normals of 0.61-1.12.  Albumin is 3.2 and total protein 6.2 suggesting protein/caloric malnutrition.  Review of systems: Dementia invalidated responses.  She stated that she was "doing fine".  When I asked if she had any medical symptoms her response was "I do not think so".  Typically after any response she would tend to giggle.  Physical exam:  Pertinent or positive findings: She is thin and appears her age.  She is wearing nasal oxygen.  The lateral eyebrows are diminished.  She is missing multiple maxillary teeth with some erosions to and below the gumline.  An easily visualized goiter is present measuring approximately 2.5 inches x 2 inches.  It is firm and movable.  Heart sounds are distant.  Heart sounds are heard best in the epigastrium.  Breath sounds are also decreased.  She has trace edema at the sock line.  Pedal pulses are decreased but palpable.  She has intermittent tremor of the left hand.  Strength opposition is surprisingly  good.  General appearance:  no acute distress, increased work of breathing is present.   Lymphatic: No lymphadenopathy about the head, neck, axilla. Eyes: No conjunctival inflammation or lid edema is present. There is no scleral icterus. Ears:  External ear exam shows no significant lesions or deformities.   Nose:  External nasal examination shows no deformity or inflammation. Nasal mucosa are pink and moist without lesions, exudates Neck:  No tenderness noted.    Heart:  No gallop, murmur, click, rub .  Lungs: without wheezes, rhonchi, rales, rubs. Abdomen: Bowel sounds are normal. Abdomen is soft and nontender with no organomegaly, hernias, masses. GU: Deferred  Extremities:  No cyanosis, clubbing  Neurologic exam :Balance, Rhomberg, finger to nose testing could not be completed due to clinical state Skin: Warm & dry w/o tenting. No significant lesions or rash.  See summary under each active problem in the Problem List with associated updated therapeutic plan

## 2021-02-19 NOTE — Patient Instructions (Signed)
See assessment and plan under each diagnosis in the problem list and acutely for this visit 

## 2021-02-22 DIAGNOSIS — J9601 Acute respiratory failure with hypoxia: Secondary | ICD-10-CM | POA: Diagnosis not present

## 2021-02-22 DIAGNOSIS — Z1159 Encounter for screening for other viral diseases: Secondary | ICD-10-CM | POA: Diagnosis not present

## 2021-03-01 DIAGNOSIS — J9601 Acute respiratory failure with hypoxia: Secondary | ICD-10-CM | POA: Diagnosis not present

## 2021-03-01 DIAGNOSIS — Z1159 Encounter for screening for other viral diseases: Secondary | ICD-10-CM | POA: Diagnosis not present

## 2021-03-08 DIAGNOSIS — Z1159 Encounter for screening for other viral diseases: Secondary | ICD-10-CM | POA: Diagnosis not present

## 2021-03-08 DIAGNOSIS — J9601 Acute respiratory failure with hypoxia: Secondary | ICD-10-CM | POA: Diagnosis not present

## 2021-03-15 DIAGNOSIS — J9601 Acute respiratory failure with hypoxia: Secondary | ICD-10-CM | POA: Diagnosis not present

## 2021-03-15 DIAGNOSIS — Z1159 Encounter for screening for other viral diseases: Secondary | ICD-10-CM | POA: Diagnosis not present

## 2021-03-19 ENCOUNTER — Encounter: Payer: Self-pay | Admitting: Adult Health

## 2021-03-19 ENCOUNTER — Non-Acute Institutional Stay (SKILLED_NURSING_FACILITY): Payer: Medicare Other | Admitting: Adult Health

## 2021-03-19 DIAGNOSIS — M159 Polyosteoarthritis, unspecified: Secondary | ICD-10-CM | POA: Diagnosis not present

## 2021-03-19 DIAGNOSIS — J9611 Chronic respiratory failure with hypoxia: Secondary | ICD-10-CM | POA: Diagnosis not present

## 2021-03-19 DIAGNOSIS — K219 Gastro-esophageal reflux disease without esophagitis: Secondary | ICD-10-CM | POA: Diagnosis not present

## 2021-03-19 DIAGNOSIS — E43 Unspecified severe protein-calorie malnutrition: Secondary | ICD-10-CM

## 2021-03-19 DIAGNOSIS — F01C0 Vascular dementia, severe, without behavioral disturbance, psychotic disturbance, mood disturbance, and anxiety: Secondary | ICD-10-CM | POA: Diagnosis not present

## 2021-03-19 DIAGNOSIS — I700 Atherosclerosis of aorta: Secondary | ICD-10-CM

## 2021-03-19 DIAGNOSIS — M810 Age-related osteoporosis without current pathological fracture: Secondary | ICD-10-CM

## 2021-03-19 DIAGNOSIS — E049 Nontoxic goiter, unspecified: Secondary | ICD-10-CM

## 2021-03-19 DIAGNOSIS — I10 Essential (primary) hypertension: Secondary | ICD-10-CM

## 2021-03-19 DIAGNOSIS — F339 Major depressive disorder, recurrent, unspecified: Secondary | ICD-10-CM | POA: Diagnosis not present

## 2021-03-19 DIAGNOSIS — I209 Angina pectoris, unspecified: Secondary | ICD-10-CM

## 2021-03-19 NOTE — Progress Notes (Signed)
Location:  [facility] Room Number: 122-W Place of Service:  SNF (31)   CODE STATUS: DNR  Allergies  Allergen Reactions   Hydrocodone-Acetaminophen Nausea And Vomiting    Chief Complaint  Patient presents with   Annual Exam    HPI:  She is a 88 year old long term resident being seen for her annual exam. She has not required any hospitalizations or ED visits this past year. Her weight is without significant change; she does require remeron low dose to maintain her appetite. She has failed coming off this medication twice. There are no reports of cough; shortness of breath no reports of uncontrolled pain. She continues to be followed for her chronic illnesses including:   Vascular dementia without behavioral disturbance:  Primary osteoarthritis: multiple joints:  Postmenopausal osteoporosis:  Goiter:  Past Medical History:  Diagnosis Date   Anxiety    Dementia (HCC)    GERD (gastroesophageal reflux disease)    Goiter    Hypertension    Major depression, recurrent, chronic (HCC) 02/22/2019   Osteoarthritis    Osteoporosis    Vitamin D insufficiency     Past Surgical History:  Procedure Laterality Date   APPENDECTOMY     KIDNEY STONE SURGERY     ORIF HIP FRACTURE Right 11/20/2012   Procedure: OPEN REDUCTION INTERNAL FIXATION RIGHT HIP;  Surgeon: Darreld Mclean, MD;  Location: [facility];  Service: Orthopedics;  Laterality: Right;   stress fractures of legs     TUBAL LIGATION      Social History   Socioeconomic History   Marital status: Widowed    Spouse name: Not on file   Number of children: Not on file   Years of education: Not on file   Highest education level: Not on file  Occupational History   Occupation: retired  Tobacco Use   Smoking status: Never   Smokeless tobacco: Never  Vaping Use   Vaping Use: Never used  Substance and Sexual Activity   Alcohol use: No   Drug use: No   Sexual activity: Not Currently  Other Topics Concern   Not  on file  Social History Narrative   Long term resident of [facility]    Social Determinants of Health   Financial Resource Strain: Not on file  Food Insecurity: Not on file  Transportation Needs: Not on file  Physical Activity: Not on file  Stress: Not on file  Social Connections: Not on file  Intimate Partner Violence: Not on file   Family History  Problem Relation Age of Onset   Heart attack Father    Heart disease Neg Hx       VITAL SIGNS BP 126/81    Pulse 68    Temp (!) 96.8 F (36 C)    Resp 18    Ht 5' (1.524 m)    Wt 115 lb 12.8 oz (52.5 kg)    SpO2 97%    BMI 22.62 kg/m   Outpatient Encounter Medications as of 03/19/2021  Medication Sig   acetaminophen (TYLENOL) 325 MG tablet Take 650 mg by mouth in the morning, at noon, and at bedtime.   Amino Acids-Protein Hydrolys (FEEDING SUPPLEMENT, PRO-STAT SUGAR FREE 64,) LIQD Take 30 mLs by mouth 3 (three) times daily with meals. 9 am, 12 pm, and 6 pm   calcium carbonate (TUMS EX) 750 MG chewable tablet Chew 1 tablet by mouth 3 (three) times daily.   Cholecalciferol 50 MCG (2000 UT) CAPS Take 1 capsule  by mouth daily in the afternoon. Special Instructions: for supplement for osteoporosis   dextromethorphan-guaiFENesin (ROBITUSSIN-DM) 10-100 MG/5ML liquid Take 10 mLs by mouth every 6 (six) hours as needed for cough.   loperamide (IMODIUM) 2 MG capsule Take 2 mg by mouth every 4 (four) hours as needed for diarrhea or loose stools.   losartan (COZAAR) 100 MG tablet Take 100 mg by mouth daily.   mirtazapine (REMERON) 7.5 MG tablet Take 7.5 mg by mouth at bedtime.   NON FORMULARY Diet:Dysphagia 3 (mechanical soft), thin liquids. OK to have soft sandwiches per LPN request.   omeprazole (PRILOSEC OTC) 20 MG tablet Take 20 mg by mouth daily.   OXYGEN Inhale 3 L into the lungs continuous.   feeding supplement (ENSURE ENLIVE / ENSURE PLUS) LIQD Take 237 mLs by mouth 2 (two) times daily between meals.   [DISCONTINUED] alendronate (FOSAMAX) 70  MG tablet Take 70 mg by mouth once a week. Take with a full glass of water on an empty stomach.   No facility-administered encounter medications on file as of 03/19/2021.     SIGNIFICANT DIAGNOSTIC EXAMS   PREVIOUS  02-18-19: 2-d echo:   1. Left ventricular ejection fraction, by visual estimation, is 70 to 75%. The left ventricle has hyperdynamic function. There is no left ventricular hypertrophy.  2. Left ventricular diastolic parameters are indeterminate.  3. The left ventricle has no regional wall motion abnormalities.  4. Global right ventricle was not well visualized.The right ventricular size is not well visualized. Right vetricular wall thickness was not assessed.  5. Left atrial size was not well visualized.  6. Right atrial size was not well visualized.  7. The mitral valve was not well visualized. No evidence of mitral valve regurgitation. No evidence of mitral stenosis.  8. The tricuspid valve is not well visualized.  9. The aortic valve was not well visualized. Aortic valve regurgitation is not visualized. Technically difficult assement, grossly there is no significant aortic stenosis. 10. The pulmonic valve was not well visualized. Pulmonic valve regurgitation is not visualized. 11. The interatrial septum was not well visualized. 12. Technically difficult and limited study  12-16-19: DEXA: t score -5.678   NO NEW EXAMS.    LABS REVIEWED PREVIOUS    06-01-20: wbc 5.5; hgb 11.0; hct 35.4;mcv 97.8 plt 197; glucose 87; bun 25; creat 0.65; k+ 4.1; na++ 141; ca 8.9; GFR>60; liver normal albumin 2.9 tsh 0.154 11-09-20: wbc 7.0; hgb 12.6; hct 38.8; mcv 96.3 plt 204; glucose 98; bun 14; creat 0.65; k+ 3.7; na++ 140; ca 8.5; GFR >60; liver normal albumin 3.2; tsh 0.687 free t4: 1.15  NO NEW LABS.   Review of Systems  Unable to perform ROS: Dementia   Physical Exam Constitutional:      General: She is not in acute distress.    Appearance: She is well-developed. She is not  diaphoretic.  Neck:     Thyroid: Thyromegaly present.     Comments: 3.1  cm nodule  Cardiovascular:     Rate and Rhythm: Normal rate and regular rhythm.     Pulses: Normal pulses.     Heart sounds: Normal heart sounds.  Pulmonary:     Effort: Pulmonary effort is normal. No respiratory distress.     Breath sounds: Normal breath sounds.     Comments: 02 Abdominal:     General: Bowel sounds are normal. There is no distension.     Palpations: Abdomen is soft.     Tenderness: There is no  abdominal tenderness.  Musculoskeletal:        General: Normal range of motion.     Cervical back: Neck supple.     Right lower leg: No edema.     Left lower leg: No edema.     Comments: Kyphosis   Lymphadenopathy:     Cervical: No cervical adenopathy.  Skin:    General: Skin is warm and dry.  Neurological:     Mental Status: She is alert. Mental status is at baseline.  Psychiatric:        Mood and Affect: Mood normal.     ASSESSMENT/ PLAN:  TODAY  GERD without esophagitis: is stable has history of GI bleed; will continue prilosec 20 mg daily   2. Vascular dementia without behavioral disturbance: current weight is 115 pounds; is off aricept will continue remeron 7.5 mg nightly to help maintain appetite.   3. Primary osteoarthritis: multiple joints: is stable will continue tylenol 1 gm three times daily .   4. Postmenopausal osteoporosis: t score -5.678 is off fosamax due to advanced age will monitor    5. Goiter: tsh 0.687 free t4: 1.15   6. Essential hypertension: is stable b/p 126/81 will continue cozaar 100 mg daily   7. Severe protein calorie malnutrition: is stable albumin 3.2 will continue supplements as directed  8. Aortic atherosclerosis: (ct 1-13.-21) will monitor   9. Major depression chronic: is stable will continue remeron 7.5 mg nightly (failed wean X2) for appetite.   10. Chronic respiratory failure with hypoxia: is stable is 02 dependent  will monitor   11. Angina  pectoris unspecified: stable without pain present.     [name] NP [facility]   [phone]

## 2021-03-22 DIAGNOSIS — Z1159 Encounter for screening for other viral diseases: Secondary | ICD-10-CM | POA: Diagnosis not present

## 2021-03-22 DIAGNOSIS — J9601 Acute respiratory failure with hypoxia: Secondary | ICD-10-CM | POA: Diagnosis not present

## 2021-03-29 DIAGNOSIS — Z1159 Encounter for screening for other viral diseases: Secondary | ICD-10-CM | POA: Diagnosis not present

## 2021-03-29 DIAGNOSIS — J9601 Acute respiratory failure with hypoxia: Secondary | ICD-10-CM | POA: Diagnosis not present

## 2021-04-05 DIAGNOSIS — J9601 Acute respiratory failure with hypoxia: Secondary | ICD-10-CM | POA: Diagnosis not present

## 2021-04-05 DIAGNOSIS — Z1159 Encounter for screening for other viral diseases: Secondary | ICD-10-CM | POA: Diagnosis not present

## 2021-04-18 ENCOUNTER — Encounter: Payer: Self-pay | Admitting: Adult Health

## 2021-04-18 ENCOUNTER — Non-Acute Institutional Stay (SKILLED_NURSING_FACILITY): Payer: Medicare Other | Admitting: Adult Health

## 2021-04-18 DIAGNOSIS — F01C0 Vascular dementia, severe, without behavioral disturbance, psychotic disturbance, mood disturbance, and anxiety: Secondary | ICD-10-CM | POA: Diagnosis not present

## 2021-04-18 DIAGNOSIS — K219 Gastro-esophageal reflux disease without esophagitis: Secondary | ICD-10-CM | POA: Diagnosis not present

## 2021-04-18 DIAGNOSIS — M159 Polyosteoarthritis, unspecified: Secondary | ICD-10-CM

## 2021-04-18 NOTE — Progress Notes (Signed)
?Location:  [facility] ?Nursing Home Room Number: 122 ?Place of Service:  SNF (31) ?Provider:  Synthia InnocentGreen, Daneli Butkiewicz, NP ? ? ?CODE STATUS: DNR ? ?Allergies  ?Allergen Reactions  ? Hydrocodone-Acetaminophen Nausea And Vomiting  ? ? ?Chief Complaint  ?Patient presents with  ? Medical Management of Chronic Issues  ?                      GERD without esophagitis:    Vascular dementia without behavioral disturbance: Primary osteoarthritis: multiple joints:  ? ? ?HPI: ? ?She is a 88 year old long term resident of this facility being seen for the management of her chronic illnesses:  GERD without esophagitis:    Vascular dementia without behavioral disturbance: Primary osteoarthritis: multiple joints. There are no reports of uncontrolled pain. No reports of changes in appetite; her weight is stable.  ? ?Past Medical History:  ?Diagnosis Date  ? Anxiety   ? Dementia (HCC)   ? GERD (gastroesophageal reflux disease)   ? Goiter   ? Hypertension   ? Major depression, recurrent, chronic (HCC) 02/22/2019  ? Osteoarthritis   ? Osteoporosis   ? Vitamin D insufficiency   ? ? ?Past Surgical History:  ?Procedure Laterality Date  ? APPENDECTOMY    ? KIDNEY STONE SURGERY    ? ORIF HIP FRACTURE Right 11/20/2012  ? Procedure: OPEN REDUCTION INTERNAL FIXATION RIGHT HIP;  Surgeon: Darreld McleanWayne Keeling, MD;  Location: [facility];  Service: Orthopedics;  Laterality: Right;  ? stress fractures of legs    ? TUBAL LIGATION    ? ? ?Social History  ? ?Socioeconomic History  ? Marital status: Widowed  ?  Spouse name: Not on file  ? Number of children: Not on file  ? Years of education: Not on file  ? Highest education level: Not on file  ?Occupational History  ? Occupation: retired  ?Tobacco Use  ? Smoking status: Never  ? Smokeless tobacco: Never  ?Vaping Use  ? Vaping Use: Never used  ?Substance and Sexual Activity  ? Alcohol use: No  ? Drug use: No  ? Sexual activity: Not Currently  ?Other Topics Concern  ? Not on file  ?Social History Narrative  ? [facility]   ? ?Social Determinants of Health  ? ?Financial Resource Strain: Not on file  ?Food Insecurity: Not on file  ?Transportation Needs: Not on file  ?Physical Activity: Not on file  ?Stress: Not on file  ?Social Connections: Not on file  ?Intimate Partner Violence: Not on file  ? ?Family History  ?Problem Relation Age of Onset  ? Heart attack Father   ? Heart disease Neg Hx   ? ? ? ? ?VITAL SIGNS ?BP 127/64   Pulse 72   Temp (!) 97.2 ?F (36.2 ?C)   Ht 5' (1.524 m)   Wt 116 lb 6.4 oz (52.8 kg)   SpO2 92%   BMI 22.73 kg/m?  ? ?Outpatient Encounter Medications as of 04/18/2021  ?Medication Sig  ? acetaminophen (TYLENOL) 325 MG tablet Take 650 mg by mouth in the morning, at noon, and at bedtime.  ? calcium carbonate (TUMS EX) 750 MG chewable tablet Chew 1 tablet by mouth 3 (three) times daily.  ? Cholecalciferol 50 MCG (2000 UT) CAPS Take 1 capsule by mouth daily in the afternoon. Special Instructions: for supplement for osteoporosis  ? dextromethorphan-guaiFENesin (ROBITUSSIN-DM) 10-100 MG/5ML liquid Take 10 mLs by mouth every 6 (six) hours as needed for cough.  ?  loperamide (IMODIUM) 2 MG capsule Take 2 mg by mouth every 4 (four) hours as needed for diarrhea or loose stools.  ? losartan (COZAAR) 100 MG tablet Take 100 mg by mouth daily.  ? mirtazapine (REMERON) 7.5 MG tablet Take 7.5 mg by mouth at bedtime.  ? NON FORMULARY Diet:Dysphagia 3 (mechanical soft), thin liquids. OK to have soft sandwiches per LPN request.  ? omeprazole (PRILOSEC OTC) 20 MG tablet Take 20 mg by mouth daily.  ? OXYGEN Inhale 3 L into the lungs continuous.  ? [DISCONTINUED] Amino Acids-Protein Hydrolys (FEEDING SUPPLEMENT, PRO-STAT SUGAR FREE 64,) LIQD Take 30 mLs by mouth 3 (three) times daily with meals. 9 am, 12 pm, and 6 pm  ? [DISCONTINUED] feeding supplement (ENSURE ENLIVE / ENSURE PLUS) LIQD Take 237 mLs by mouth 2 (two) times daily between meals.  ? ?No facility-administered encounter medications on file as of  04/18/2021.  ? ? ? ?SIGNIFICANT DIAGNOSTIC EXAMS ? ? ?PREVIOUS ? ?12-16-19: DEXA: t score -5.678  ? ?NO NEW EXAMS.  ?  ?LABS REVIEWED PREVIOUS  ?  ?06-01-20: wbc 5.5; hgb 11.0; hct 35.4;mcv 97.8 plt 197; glucose 87; bun 25; creat 0.65; k+ 4.1; na++ 141; ca 8.9; GFR>60; liver normal albumin 2.9 tsh 0.154 ?11-09-20: wbc 7.0; hgb 12.6; hct 38.8; mcv 96.3 plt 204; glucose 98; bun 14; creat 0.65; k+ 3.7; na++ 140; ca 8.5; GFR >60; liver normal albumin 3.2; tsh 0.687 free t4: 1.15 ? ?NO NEW LABS.  ? ?Review of Systems  ?Unable to perform ROS: Dementia  ? ?Physical Exam ?Constitutional:   ?   General: She is not in acute distress. ?   Appearance: She is well-developed. She is not diaphoretic.  ?Neck:  ?   Thyroid: Thyromegaly present.  ?   Comments: 3.1 cm nodule ?Cardiovascular:  ?   Rate and Rhythm: Normal rate and regular rhythm.  ?   Pulses: Normal pulses.  ?   Heart sounds: Normal heart sounds.  ?Pulmonary:  ?   Effort: Pulmonary effort is normal. No respiratory distress.  ?   Breath sounds: Normal breath sounds.  ?   Comments: 02 ?Abdominal:  ?   General: Bowel sounds are normal. There is no distension.  ?   Palpations: Abdomen is soft.  ?   Tenderness: There is no abdominal tenderness.  ?Musculoskeletal:     ?   General: Normal range of motion.  ?   Cervical back: Neck supple.  ?   Right lower leg: No edema.  ?   Left lower leg: No edema.  ?   Comments: Kyphosis   ?Lymphadenopathy:  ?   Cervical: No cervical adenopathy.  ?Skin: ?   General: Skin is warm and dry.  ?Neurological:  ?   Mental Status: She is alert. Mental status is at baseline.  ?Psychiatric:     ?   Mood and Affect: Mood normal.  ?  ? ?ASSESSMENT/ PLAN: ? ?TODAY ? ?GERD without esophagitis: is stable has history of GI bleed; will continue prilosec 20 mg daily  ? ?2. Vascular dementia without behavioral disturbance: current weight is 116 pounds; is off aricept will continue remeron 7.5 mg nightly to help maintain appetite.  ? ?3. Primary osteoarthritis:  multiple joints: is stable will continue tylenol 1 gm three times daily .  ? ? ?PREVIOUS  ? ?4. Postmenopausal osteoporosis: t score -5.678 is off fosamax due to advanced age will monitor  ?  ?5. Goiter: tsh 0.687 free t4: 1.15  ? ?6.  Essential hypertension: is stable b/p 126/81 will continue cozaar 100 mg daily  ? ?7. Severe protein calorie malnutrition: is stable albumin 3.2 will continue supplements as directed ? ?8. Aortic atherosclerosis: (ct 1-13.-21) will monitor  ? ?9. Major depression chronic: is stable will continue remeron 7.5 mg nightly (failed wean X2) for appetite.  ? ?10. Chronic respiratory failure with hypoxia: is stable is 02 dependent  will monitor  ? ?11. Angina pectoris unspecified: stable without pain present.  ? ? ? ? ?[name] NP ?[facility]  ? call [phone]   ?

## 2021-04-20 ENCOUNTER — Encounter: Payer: Self-pay | Admitting: Adult Health

## 2021-05-04 ENCOUNTER — Encounter: Payer: Self-pay | Admitting: Adult Health

## 2021-05-04 ENCOUNTER — Non-Acute Institutional Stay (SKILLED_NURSING_FACILITY): Payer: Medicare Other | Admitting: Adult Health

## 2021-05-04 DIAGNOSIS — F339 Major depressive disorder, recurrent, unspecified: Secondary | ICD-10-CM

## 2021-05-04 DIAGNOSIS — J9611 Chronic respiratory failure with hypoxia: Secondary | ICD-10-CM

## 2021-05-04 DIAGNOSIS — I700 Atherosclerosis of aorta: Secondary | ICD-10-CM

## 2021-05-04 NOTE — Progress Notes (Signed)
?Location:  [facility] ?Nursing Home Room Number: 122-W ?Place of Service:  SNF (31) ? ? ?CODE STATUS: DNR ? ?Allergies  ?Allergen Reactions  ? Hydrocodone-Acetaminophen Nausea And Vomiting  ? ? ?Chief Complaint  ?Patient presents with  ? Acute Visit  ?  Care plan meeting  ? ? ?HPI: ? ?We have come together for her care plan meeting. BIMS 4/15 mood 3/10: not sleeping well. She is nonambulatory has had one fall in Jan without injury. Her 02 is now prn. She requires extensive assist to dependent with her adl care. She is incontinent of bladder and bowel. Dietary: mech soft with ground meats. Weight  is 116.4 pounds stable variable appetite. Therapy: none at this time. She will continue to be followed for her chronic illnesses including:  Aortic atherosclerosis Chronic respiratory failure with hypoxia  Major depression recurrent chronic ? ?Past Medical History:  ?Diagnosis Date  ? Anxiety   ? Dementia (HCC)   ? GERD (gastroesophageal reflux disease)   ? Goiter   ? Hypertension   ? Major depression, recurrent, chronic (HCC) 02/22/2019  ? Osteoarthritis   ? Osteoporosis   ? Vitamin D insufficiency   ? ? ?Past Surgical History:  ?Procedure Laterality Date  ? APPENDECTOMY    ? KIDNEY STONE SURGERY    ? ORIF HIP FRACTURE Right 11/20/2012  ? Procedure: OPEN REDUCTION INTERNAL FIXATION RIGHT HIP;  Surgeon: Darreld Mclean, MD;  Location: [facility];  Service: Orthopedics;  Laterality: Right;  ? stress fractures of legs    ? TUBAL LIGATION    ? ? ?Social History  ? ?Socioeconomic History  ? Marital status: Widowed  ?  Spouse name: Not on file  ? Number of children: Not on file  ? Years of education: Not on file  ? Highest education level: Not on file  ?Occupational History  ? Occupation: retired  ?Tobacco Use  ? Smoking status: Never  ? Smokeless tobacco: Never  ?Vaping Use  ? Vaping Use: Never used  ?Substance and Sexual Activity  ? Alcohol use: No  ? Drug use: No  ? Sexual activity: Not Currently  ?Other Topics Concern  ?  Not on file  ?Social History Narrative  ? Long term resident of [facility]   ? ?Social Determinants of Health  ? ?Financial Resource Strain: Not on file  ?Food Insecurity: Not on file  ?Transportation Needs: Not on file  ?Physical Activity: Not on file  ?Stress: Not on file  ?Social Connections: Not on file  ?Intimate Partner Violence: Not on file  ? ?Family History  ?Problem Relation Age of Onset  ? Heart attack Father   ? Heart disease Neg Hx   ? ? ? ? ?VITAL SIGNS ?BP 133/66   Pulse 77   Temp 98.1 ?F (36.7 ?C)   Ht 5' (1.524 m)   Wt 116 lb 6.4 oz (52.8 kg)   SpO2 93%   BMI 22.73 kg/m?  ? ?Outpatient Encounter Medications as of 05/04/2021  ?Medication Sig  ? acetaminophen (TYLENOL) 325 MG tablet Take 650 mg by mouth in the morning, at noon, and at bedtime.  ? calcium carbonate (TUMS EX) 750 MG chewable tablet Chew 1 tablet by mouth 3 (three) times daily.  ? Cholecalciferol 50 MCG (2000 UT) CAPS Take 1 capsule by mouth daily in the afternoon. Special Instructions: for supplement for osteoporosis  ? dextromethorphan-guaiFENesin (ROBITUSSIN-DM) 10-100 MG/5ML liquid Take 10 mLs by mouth every 6 (six) hours as needed for cough.  ? loperamide (IMODIUM)  2 MG capsule Take 2 mg by mouth every 4 (four) hours as needed for diarrhea or loose stools.  ? losartan (COZAAR) 100 MG tablet Take 100 mg by mouth daily.  ? mirtazapine (REMERON) 7.5 MG tablet Take 7.5 mg by mouth at bedtime.  ? NON FORMULARY Diet:Dysphagia 3 (mechanical soft), thin liquids. OK to have soft sandwiches per LPN request.  ? omeprazole (PRILOSEC OTC) 20 MG tablet Take 20 mg by mouth daily.  ? OXYGEN Inhale 3 L into the lungs continuous.  ? ?No facility-administered encounter medications on file as of 05/04/2021.  ? ? ? ?SIGNIFICANT DIAGNOSTIC EXAMS ? ?PREVIOUS ? ?12-16-19: DEXA: t score -5.678  ? ?NO NEW EXAMS.  ?  ?LABS REVIEWED PREVIOUS  ?  ?06-01-20: wbc 5.5; hgb 11.0; hct 35.4;mcv 97.8 plt 197; glucose 87; bun 25; creat 0.65; k+ 4.1; na++ 141; ca 8.9;  GFR>60; liver normal albumin 2.9 tsh 0.154 ?11-09-20: wbc 7.0; hgb 12.6; hct 38.8; mcv 96.3 plt 204; glucose 98; bun 14; creat 0.65; k+ 3.7; na++ 140; ca 8.5; GFR >60; liver normal albumin 3.2; tsh 0.687 free t4: 1.15 ? ?NO NEW LABS.  ? ?Review of Systems  ?Unable to perform ROS: Dementia  ? ? ?Physical Exam ?Constitutional:   ?   General: She is not in acute distress. ?   Appearance: She is well-developed. She is not diaphoretic.  ?Neck:  ?   Thyroid: Thyromegaly present.  ?   Comments: 3.1 cm nodule ?Cardiovascular:  ?   Rate and Rhythm: Normal rate and regular rhythm.  ?   Heart sounds: Normal heart sounds.  ?Pulmonary:  ?   Effort: Pulmonary effort is normal. No respiratory distress.  ?   Breath sounds: Normal breath sounds.  ?Abdominal:  ?   General: Bowel sounds are normal. There is no distension.  ?   Palpations: Abdomen is soft.  ?   Tenderness: There is no abdominal tenderness.  ?Musculoskeletal:     ?   General: Normal range of motion.  ?   Cervical back: Neck supple.  ?   Right lower leg: No edema.  ?   Left lower leg: No edema.  ?   Comments: Kyphosis   ?Lymphadenopathy:  ?   Cervical: No cervical adenopathy.  ?Skin: ?   General: Skin is warm and dry.  ?Neurological:  ?   Mental Status: She is alert. Mental status is at baseline.  ?Psychiatric:     ?   Mood and Affect: Mood normal.  ? ? ? ?ASSESSMENT/ PLAN: ? ?TODAY ? ?Aortic atherosclerosis ?Chronic respiratory failure with hypoxia ?Major depression recurrent chronic ? ?Will continue current medications ?Will continue current plan of care ?Will continue to monitor her status.  ? ?Time spent with patient: 40 minutes: medications; plan of care  ? ? ? ?[name] NP ?[facility]  ? call [phone]  ? ?

## 2021-05-14 DIAGNOSIS — J9601 Acute respiratory failure with hypoxia: Secondary | ICD-10-CM | POA: Diagnosis not present

## 2021-05-14 DIAGNOSIS — Z1159 Encounter for screening for other viral diseases: Secondary | ICD-10-CM | POA: Diagnosis not present

## 2021-05-24 ENCOUNTER — Encounter: Payer: Self-pay | Admitting: Internal Medicine

## 2021-05-24 ENCOUNTER — Non-Acute Institutional Stay (SKILLED_NURSING_FACILITY): Payer: Medicare Other | Admitting: Internal Medicine

## 2021-05-24 DIAGNOSIS — D649 Anemia, unspecified: Secondary | ICD-10-CM

## 2021-05-24 DIAGNOSIS — E049 Nontoxic goiter, unspecified: Secondary | ICD-10-CM

## 2021-05-24 DIAGNOSIS — F01C0 Vascular dementia, severe, without behavioral disturbance, psychotic disturbance, mood disturbance, and anxiety: Secondary | ICD-10-CM | POA: Diagnosis not present

## 2021-05-24 DIAGNOSIS — I10 Essential (primary) hypertension: Secondary | ICD-10-CM | POA: Diagnosis not present

## 2021-05-24 NOTE — Assessment & Plan Note (Signed)
Anemia had resolved as of 11/09/2020.  No bleeding dyscrasias reported by staff. ?

## 2021-05-24 NOTE — Assessment & Plan Note (Signed)
She can provide no history and cannot follow commands reliably.  She confabulates nonsensically.  Imaging suggest vascular dementia as etiology. ?

## 2021-05-24 NOTE — Progress Notes (Signed)
? ?  NURSING HOME LOCATION:  [facility] ?ROOM NUMBER:  45 W ? ?CODE STATUS:  DNR ? ?PCP:  [name] NP,PSC ? ?This is a nursing facility follow up visit of chronic medical diagnoses & to document compliance with Regulation 483.30 (c) in The Long Term Care Survey Manual Phase 2 which mandates caregiver visit ( visits can alternate among physician, PA or NP as per statutes) within 10 days of 30 days / 60 days/ 90 days post admission to SNF date   ? ?Interim medical record and care since last SNF visit was updated with review of diagnostic studies and change in clinical status since last visit were documented. ? ?HPI: She is a permanent resident of this facility with medical diagnoses of GERD, history of goiter, essential hypertension, history of anxiety/major depression, osteoporosis, and dementia. ? ?Most recent labs were completed 11/09/2020.  Protein/caloric malnutrition had improved with an albumin of 3.2 and total protein of 6.2.  Additionally anemia had resolved at that time with H/H of 12.6/38.8. ? ?Review of systems: Dementia invalidated responses.  When I entered the room she asked "where you been for so along?".  She was seemingly trying to extricate her right hand from the covers; but she told me that she was "trying to clean my arm".  She went on to confabulate about " burns ", indicating the forearms.  When I auscultated her heart she stated "I don't have no heartbeat.".  She could not answer queries directly nor follow commands. ? ?Physical exam:  ?Pertinent or positive findings: Appears her age and chronically ill.  Hair is disheveled.  The mandibular teeth are malaligned.  She has severe erosions and caries of the maxillary teeth.  Goiter is present over the midline of the anterior neck. Heart sounds are distant and heard best in the epigastrium.  Breath sounds are decreased.  The thorax essentially sits on the hips.  Pedal pulses are decreased but palpable.  Trace edema is noted  at the sock line.  She has isolated osteoarthritic changes.  There is nonspecific scarring over the forearms which is bland in appearance.No burn injury present. ? ?General appearance: no acute distress, increased work of breathing is present.   ?Lymphatic: No lymphadenopathy about the head, neck, axilla. ?Eyes: No conjunctival inflammation or lid edema is present. There is no scleral icterus. ?Ears:  External ear exam shows no significant lesions or deformities.   ?Nose:  External nasal examination shows no deformity or inflammation. Nasal mucosa are pink and moist without lesions, exudates ?Neck:  No thyromegaly, masses, tenderness noted.    ?Heart:  No gallop, murmur, click, rub .  ?Lungs:  without wheezes, rhonchi, rales, rubs. ?Abdomen: Bowel sounds are normal. Abdomen is soft and nontender with no organomegaly, hernias, masses. ?GU: Deferred  ?Extremities:  No cyanosis, clubbing  ?Neurologic exam :Balance, Rhomberg, finger to nose testing could not be completed due to clinical state ?Skin: Warm & dry w/o tenting. ?No significant  rash. ? ?See summary under each active problem in the Problem List with associated updated therapeutic plan ? ? ?

## 2021-05-24 NOTE — Patient Instructions (Signed)
See assessment and plan under each diagnosis in the problem list and acutely for this visit 

## 2021-05-24 NOTE — Assessment & Plan Note (Signed)
BP controlled; no change in antihypertensive medications  

## 2021-05-24 NOTE — Assessment & Plan Note (Addendum)
Serially the TSH has been low normal but increasing.  It can be updated in the next 2-3 months.  Medically she is euthyroid. ?

## 2021-06-05 IMAGING — CT CT HEAD W/O CM
3 series · 15 of 47 positions shown, 18 images · non-contrast
Comparison: 12/28/2012

CLINICAL DATA: Headache

EXAM:
CT HEAD WITHOUT CONTRAST
TECHNIQUE: Contiguous axial images were obtained from the base of the skull
through the vertex without intravenous contrast.

[Series 2: head w o · axial · 0.47mm/px · z∈[-45,+90]mm · 9 of 33 slices shown, 12 images]
[im 3/33  brain]
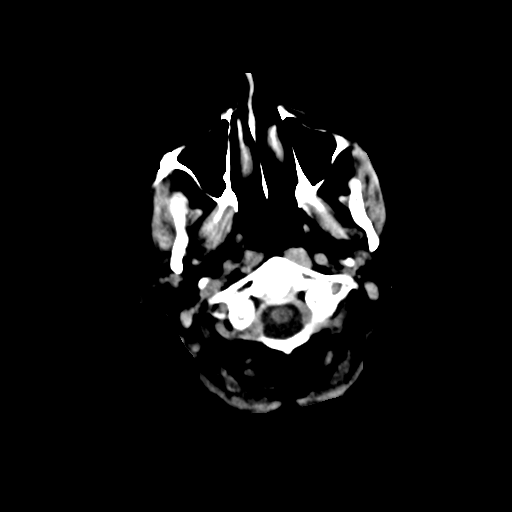
[im 3/33  bone]
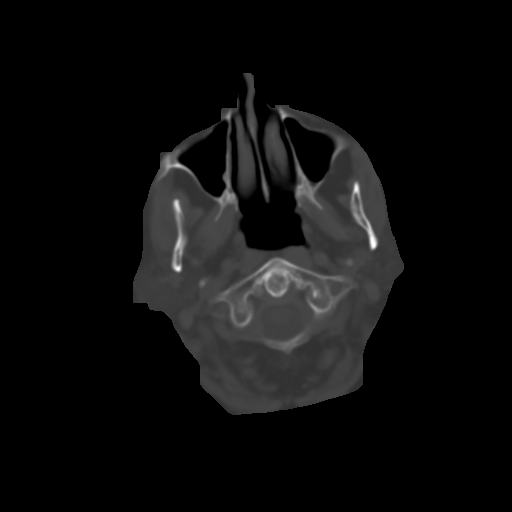
[im 6/33  brain]
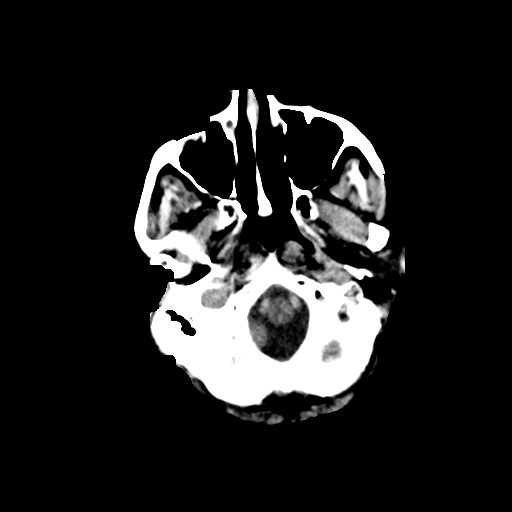
[im 9/33  brain]
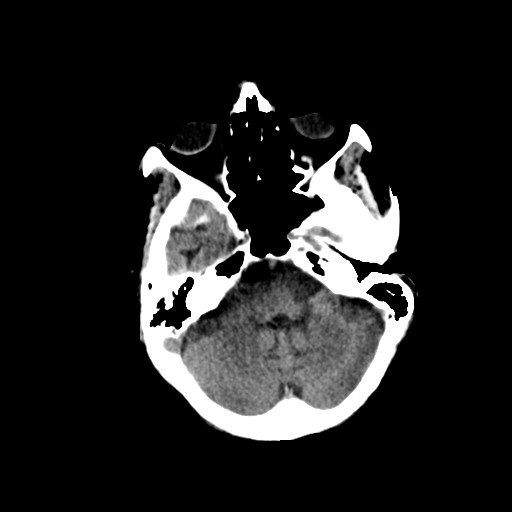
[im 13/33  brain]
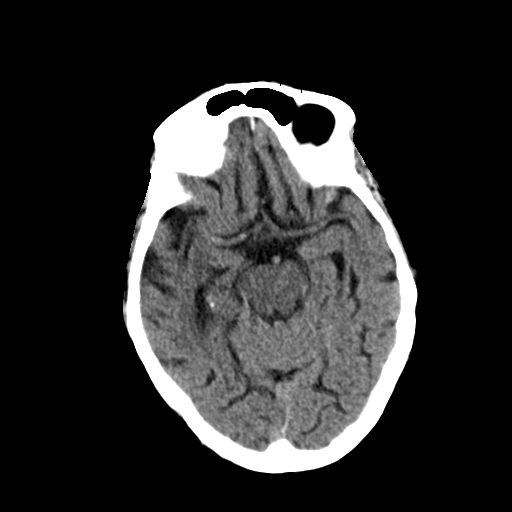
[im 17/33  brain]
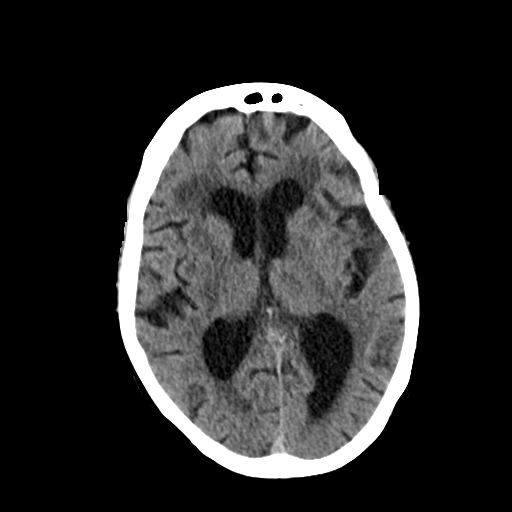
[im 17/33  bone]
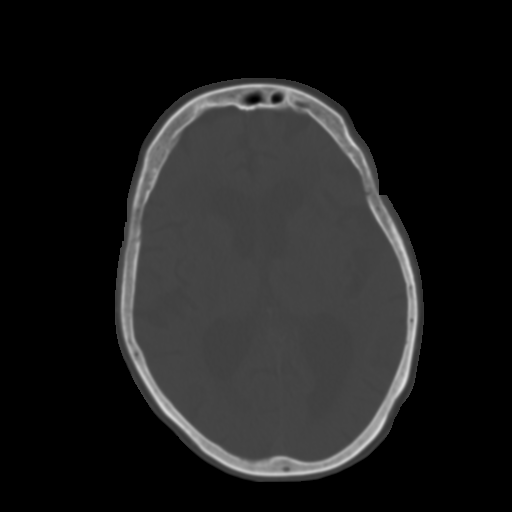
[im 20/33  brain]
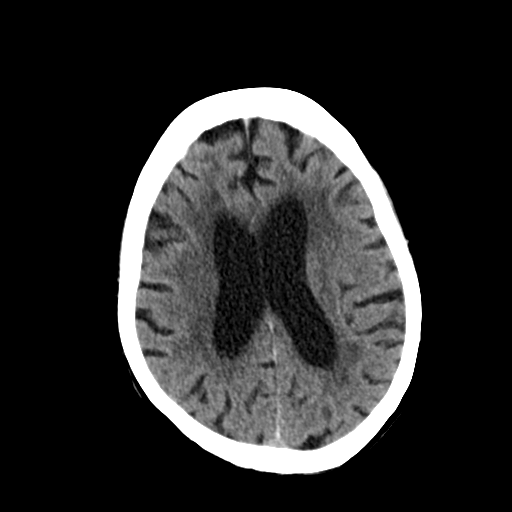
[im 24/33  brain]
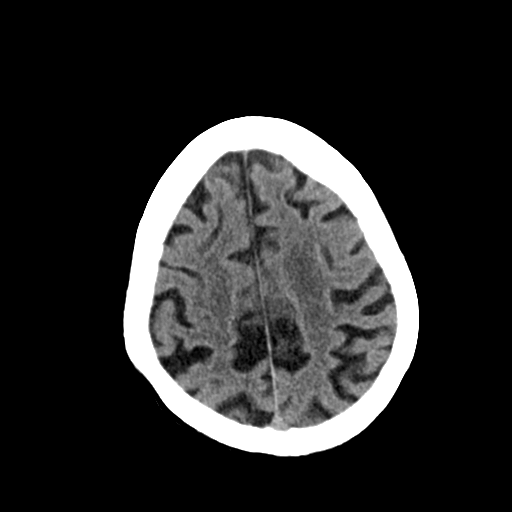
[im 27/33  brain]
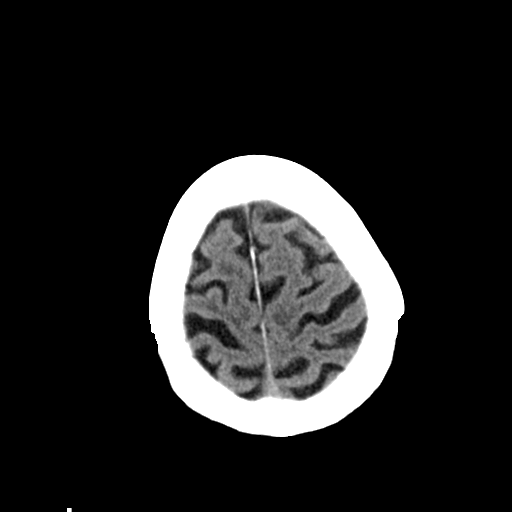
[im 30/33  brain]
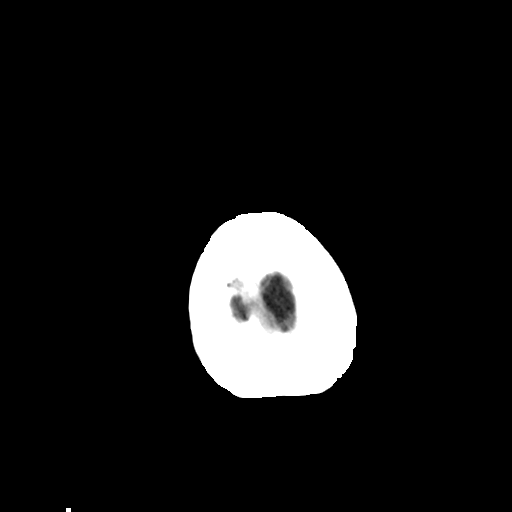
[im 30/33  bone]
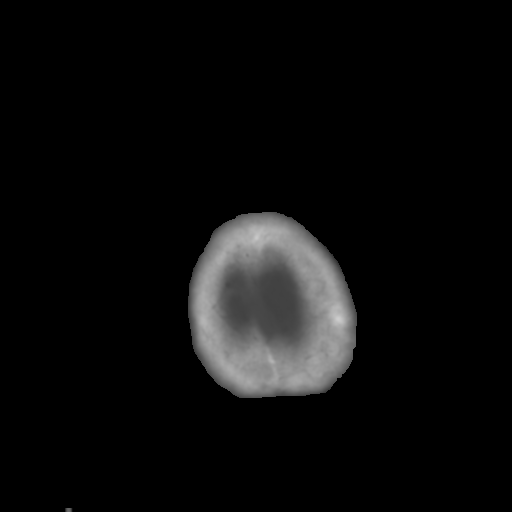

[Series 4: coronal soft · coronal · 0.32mm/px · 3 of 73 slices shown]
[im 25/73  brain]
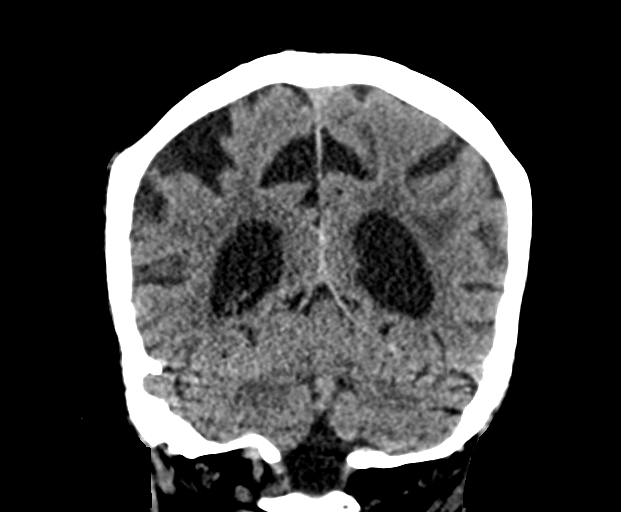
[im 33/73  brain]
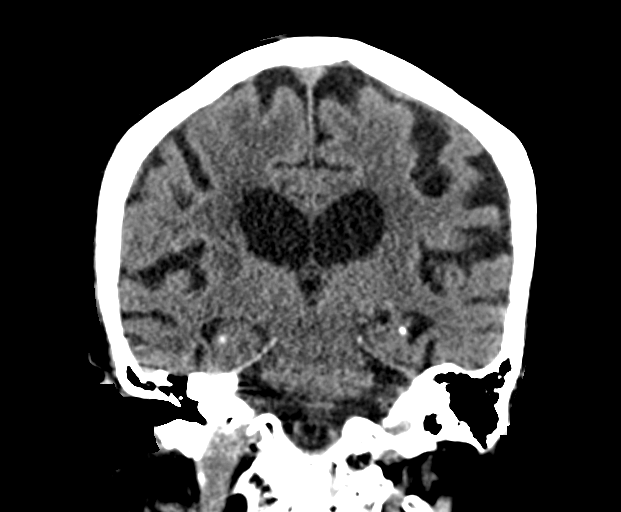
[im 41/73  brain]
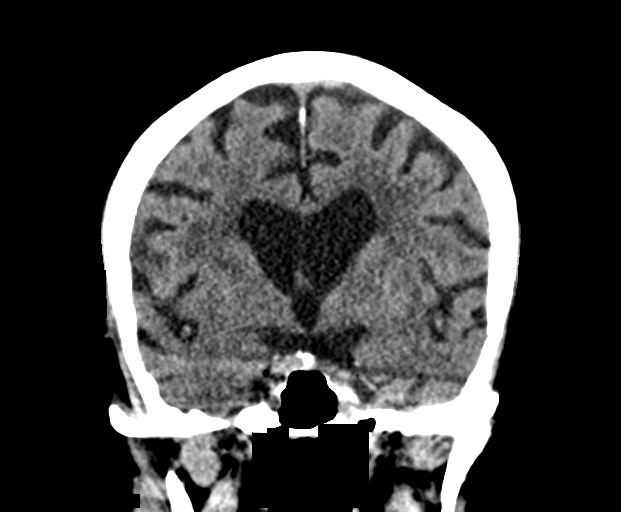

[Series 5: sagittal soft · sagittal · 0.35mm/px · 3 of 66 slices shown]
[im 22/66  brain]
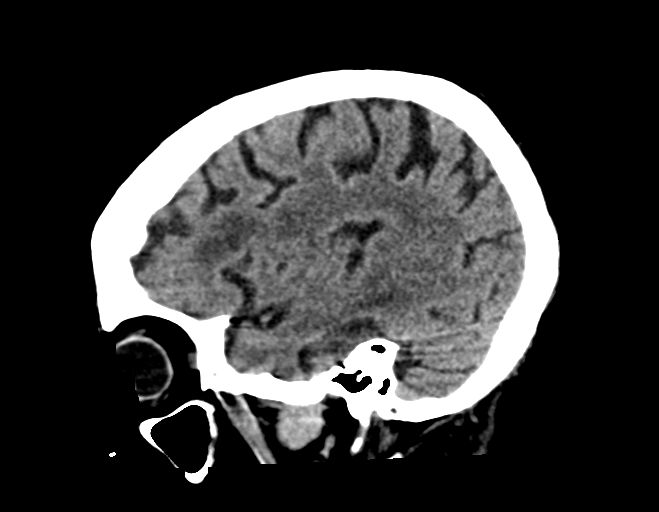
[im 33/66  brain]
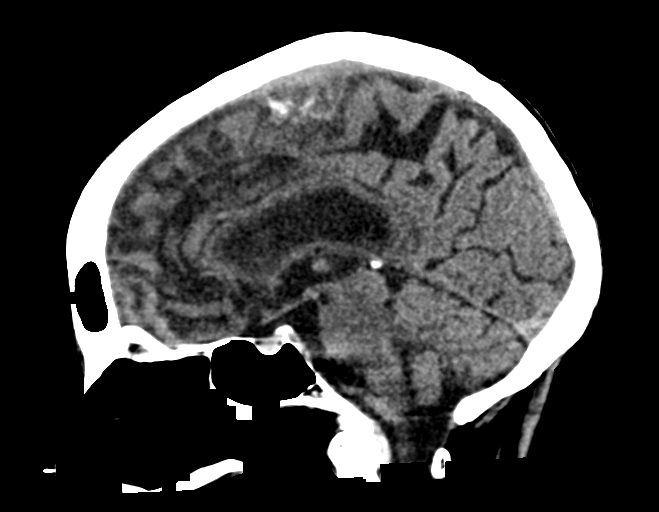
[im 44/66  brain]
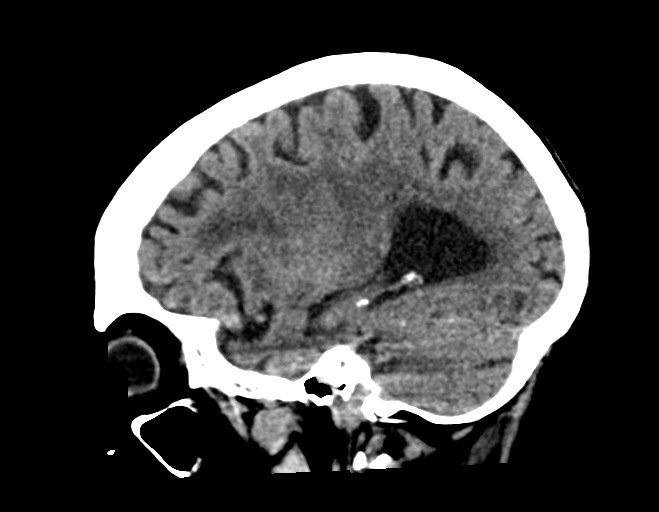

[15 of 47 positions shown; findings below may reference images not displayed]

FINDINGS: Brain: There is atrophy and chronic small vessel disease changes. No
acute intracranial abnormality. Specifically, no hemorrhage,
hydrocephalus, mass lesion, acute infarction, or significant
intracranial injury.

Vascular: No hyperdense vessel or unexpected calcification.

Skull: No acute calvarial abnormality.

Sinuses/Orbits: Visualized paranasal sinuses and mastoids clear.
Orbital soft tissues unremarkable.

Other: None
IMPRESSION: Atrophy, chronic microvascular disease.

No acute intracranial abnormality.

## 2021-06-26 DIAGNOSIS — Z23 Encounter for immunization: Secondary | ICD-10-CM | POA: Diagnosis not present

## 2021-07-05 ENCOUNTER — Encounter: Payer: Self-pay | Admitting: Adult Health

## 2021-07-05 ENCOUNTER — Non-Acute Institutional Stay (SKILLED_NURSING_FACILITY): Payer: Medicare Other | Admitting: Adult Health

## 2021-07-05 DIAGNOSIS — E049 Nontoxic goiter, unspecified: Secondary | ICD-10-CM | POA: Diagnosis not present

## 2021-07-05 DIAGNOSIS — I10 Essential (primary) hypertension: Secondary | ICD-10-CM

## 2021-07-05 DIAGNOSIS — E43 Unspecified severe protein-calorie malnutrition: Secondary | ICD-10-CM | POA: Diagnosis not present

## 2021-07-05 DIAGNOSIS — M810 Age-related osteoporosis without current pathological fracture: Secondary | ICD-10-CM | POA: Diagnosis not present

## 2021-07-05 NOTE — Progress Notes (Signed)
Location:  [facility] Room Number: 114-W Place of Service:  SNF (31)   CODE STATUS: DNR  Allergies  Allergen Reactions   Hydrocodone-Acetaminophen Nausea And Vomiting    Chief Complaint  Patient presents with   Medical Management of Chronic Issues                                         Post menopausal osteoporosis: Goiter: Essential hypertension: Severe protein calorie malnutrition:     HPI:  She is a 88 year old long term resident of this facility being seen for the management of her chronic illnesses including:  Post menopausal osteoporosis: Goiter: Essential hypertension: Severe protein calorie malnutrition. There are no reports of uncontrolled pain. No reports of changes in appetite; weight is stable.   Past Medical History:  Diagnosis Date   Anxiety    Dementia (HCC)    GERD (gastroesophageal reflux disease)    Goiter    Hypertension    Major depression, recurrent, chronic (HCC) 02/22/2019   Osteoarthritis    Osteoporosis    Vitamin D insufficiency     Past Surgical History:  Procedure Laterality Date   APPENDECTOMY     KIDNEY STONE SURGERY     ORIF HIP FRACTURE Right 11/20/2012   Procedure: OPEN REDUCTION INTERNAL FIXATION RIGHT HIP;  Surgeon: Darreld Mclean, MD;  Location: [facility];  Service: Orthopedics;  Laterality: Right;   stress fractures of legs     TUBAL LIGATION      Social History   Socioeconomic History   Marital status: Widowed    Spouse name: Not on file   Number of children: Not on file   Years of education: Not on file   Highest education level: Not on file  Occupational History   Occupation: retired  Tobacco Use   Smoking status: Never   Smokeless tobacco: Never  Vaping Use   Vaping Use: Never used  Substance and Sexual Activity   Alcohol use: No   Drug use: No   Sexual activity: Not Currently  Other Topics Concern   Not on file  Social History Narrative   Long term resident of [facility]    Social Determinants  of Health   Financial Resource Strain: Not on file  Food Insecurity: Not on file  Transportation Needs: Not on file  Physical Activity: Not on file  Stress: Not on file  Social Connections: Not on file  Intimate Partner Violence: Not on file   Family History  Problem Relation Age of Onset   Heart attack Father    Heart disease Neg Hx       VITAL SIGNS BP 128/71   Pulse 85   Temp 98.1 F (36.7 C)   Resp 20   Ht 5' (1.524 m)   Wt 115 lb (52.2 kg)   SpO2 100%   BMI 22.46 kg/m   Outpatient Encounter Medications as of 07/05/2021  Medication Sig   acetaminophen (TYLENOL) 325 MG tablet Take 650 mg by mouth in the morning, at noon, and at bedtime.   calcium carbonate (TUMS EX) 750 MG chewable tablet Chew 1 tablet by mouth 3 (three) times daily.   Cholecalciferol 50 MCG (2000 UT) CAPS Take 1 capsule by mouth daily in the afternoon. Special Instructions: for supplement for osteoporosis   dextromethorphan-guaiFENesin (ROBITUSSIN-DM) 10-100 MG/5ML liquid Take 10 mLs by mouth every 6 (six) hours  as needed for cough.   loperamide (IMODIUM) 2 MG capsule Take 2 mg by mouth every 4 (four) hours as needed for diarrhea or loose stools.   losartan (COZAAR) 100 MG tablet Take 100 mg by mouth daily.   mirtazapine (REMERON) 7.5 MG tablet Take 7.5 mg by mouth at bedtime.   NON FORMULARY Diet:Dysphagia 3 (mechanical soft), thin liquids. OK to have soft sandwiches per LPN request.   omeprazole (PRILOSEC OTC) 20 MG tablet Take 20 mg by mouth daily.   OXYGEN Inhale 3 L into the lungs continuous.   No facility-administered encounter medications on file as of 07/05/2021.     SIGNIFICANT DIAGNOSTIC EXAMS  PREVIOUS  12-16-19: DEXA: t score -5.678   NO NEW EXAMS.    LABS REVIEWED PREVIOUS    11-09-20: wbc 7.0; hgb 12.6; hct 38.8; mcv 96.3 plt 204; glucose 98; bun 14; creat 0.65; k+ 3.7; na++ 140; ca 8.5; GFR >60; liver normal albumin 3.2; tsh 0.687 free t4: 1.15  NO NEW LABS.   Review of  Systems  Unable to perform ROS: Dementia    Physical Exam Constitutional:      General: She is not in acute distress.    Appearance: She is well-developed. She is not diaphoretic.  Neck:     Thyroid: Thyroid mass and thyromegaly present.     Comments: 3.1 cm nodule Cardiovascular:     Rate and Rhythm: Normal rate and regular rhythm.     Heart sounds: Normal heart sounds.  Pulmonary:     Effort: Pulmonary effort is normal. No respiratory distress.     Breath sounds: Normal breath sounds.  Abdominal:     General: Bowel sounds are normal. There is no distension.     Palpations: Abdomen is soft.     Tenderness: There is no abdominal tenderness.  Musculoskeletal:        General: Normal range of motion.     Cervical back: Neck supple.     Right lower leg: No edema.     Left lower leg: No edema.     Comments: kyphosis  Lymphadenopathy:     Cervical: No cervical adenopathy.  Skin:    General: Skin is warm and dry.  Neurological:     Mental Status: She is alert. Mental status is at baseline.  Psychiatric:        Mood and Affect: Mood normal.    .     ASSESSMENT/ PLAN:  TODAY  Post menopausal osteoporosis: t score: -5.678 is off fosamax due to advanced age.   2. Goiter: tsh 0.687 free t4: 1.15  3. Essential hypertension: b/p 128/71: will continue cozaar 100 mg daily   4. Severe protein calorie malnutrition: albumin 3.2 will continue supplements as directed.   PREVIOUS   5. Aortic atherosclerosis: (ct 1-13.-21) will monitor   6. Major depression chronic: is stable will continue remeron 7.5 mg nightly (failed wean X2) for appetite.   7. Chronic respiratory failure with hypoxia: is stable is 02 dependent  will monitor   8. Angina pectoris unspecified: stable without pain present.   9. GERD without esophagitis: is stable has history of GI bleed; will continue prilosec 20 mg daily   10. Vascular dementia without behavioral disturbance: current weight is 116 pounds; is  off aricept will continue remeron 7.5 mg nightly to help maintain appetite.   11. Primary osteoarthritis: multiple joints: is stable will continue tylenol 1 gm three times daily .   Will check; cbc; cmp; tsh free  t4   Synthia Innocenteborah Serrina [place] NP [facility]  call [phone]

## 2021-07-11 ENCOUNTER — Encounter (HOSPITAL_COMMUNITY)
Admission: RE | Admit: 2021-07-11 | Discharge: 2021-07-11 | Disposition: A | Discharge status: Home / Self Care | Payer: Medicare Other | Source: Skilled Nursing Facility | Attending: Adult Health | Admitting: Adult Health

## 2021-07-11 DIAGNOSIS — F0390 Unspecified dementia without behavioral disturbance: Secondary | ICD-10-CM | POA: Insufficient documentation

## 2021-07-11 LAB — COMPREHENSIVE METABOLIC PANEL
ALT: 11 U/L (ref 0–44)
AST: 18 U/L (ref 15–41)
Albumin: 3 g/dL — ABNORMAL LOW (ref 3.5–5.0)
Alkaline Phosphatase: 38 U/L (ref 38–126)
Anion gap: 4 — ABNORMAL LOW (ref 5–15)
BUN: 19 mg/dL (ref 8–23)
CO2: 28 mmol/L (ref 22–32)
Calcium: 8.9 mg/dL (ref 8.9–10.3)
Chloride: 111 mmol/L (ref 98–111)
Creatinine, Ser: 0.58 mg/dL (ref 0.44–1.00)
GFR, Estimated: >60 mL/min (ref >60)
Glucose, Bld: 86 mg/dL (ref 70–99)
Potassium: 3.9 mmol/L (ref 3.5–5.1)
Sodium: 143 mmol/L (ref 135–145)
Total Bilirubin: 0.5 mg/dL (ref 0.3–1.2)
Total Protein: 6.1 g/dL — ABNORMAL LOW (ref 6.5–8.1)

## 2021-07-11 LAB — CBC
HCT: 35.4 % — ABNORMAL LOW (ref 36.0–46.0)
Hemoglobin: 11.3 g/dL — ABNORMAL LOW (ref 12.0–15.0)
MCH: 30.3 pg (ref 26.0–34.0)
MCHC: 31.9 g/dL (ref 30.0–36.0)
MCV: 94.9 fL (ref 80.0–100.0)
Platelets: 211 10*3/uL (ref 150–400)
RBC: 3.73 MIL/uL — ABNORMAL LOW (ref 3.87–5.11)
RDW: 13.7 % (ref 11.5–15.5)
WBC: 5.3 10*3/uL (ref 4.0–10.5)
nRBC: 0 % (ref 0.0–0.2)

## 2021-07-11 LAB — T4, FREE: Free T4: 0.89 ng/dL (ref 0.61–1.12)

## 2021-07-31 ENCOUNTER — Non-Acute Institutional Stay (SKILLED_NURSING_FACILITY): Payer: Medicare Other | Admitting: Adult Health

## 2021-07-31 ENCOUNTER — Encounter: Payer: Self-pay | Admitting: Adult Health

## 2021-07-31 DIAGNOSIS — F339 Major depressive disorder, recurrent, unspecified: Secondary | ICD-10-CM | POA: Diagnosis not present

## 2021-07-31 DIAGNOSIS — I209 Angina pectoris, unspecified: Secondary | ICD-10-CM | POA: Diagnosis not present

## 2021-07-31 DIAGNOSIS — J9611 Chronic respiratory failure with hypoxia: Secondary | ICD-10-CM | POA: Diagnosis not present

## 2021-07-31 DIAGNOSIS — I700 Atherosclerosis of aorta: Secondary | ICD-10-CM

## 2021-07-31 NOTE — Progress Notes (Signed)
Location:  [facility] Room Number: 114/W Place of Service:  SNF (31)   CODE STATUS: DNR  Allergies  Allergen Reactions   Hydrocodone-Acetaminophen Nausea And Vomiting    Chief Complaint  Patient presents with   Medical Management of Chronic Issues                                            Aortic atherosclerosis  Major depression chronic:  Chronic respiratory failure with hypoxia:  Angina pectoris unspecified;     HPI:  She is a 88 year old long term resident of this facility being seen for the management of her chronic illnesses:  Aortic atherosclerosis  Major depression chronic:  Chronic respiratory failure with hypoxia:  Angina pectoris unspecified. She is more socialable. Her weight is stable. She is now off 02. There are no reports of uncontrolled pain.   Past Medical History:  Diagnosis Date   Anxiety    Dementia (HCC)    GERD (gastroesophageal reflux disease)    Goiter    Hypertension    Major depression, recurrent, chronic (HCC) 02/22/2019   Osteoarthritis    Osteoporosis    Vitamin D insufficiency     Past Surgical History:  Procedure Laterality Date   APPENDECTOMY     KIDNEY STONE SURGERY     ORIF HIP FRACTURE Right 11/20/2012   Procedure: OPEN REDUCTION INTERNAL FIXATION RIGHT HIP;  Surgeon: Darreld Mclean, MD;  Location: [facility];  Service: Orthopedics;  Laterality: Right;   stress fractures of legs     TUBAL LIGATION      Social History   Socioeconomic History   Marital status: Widowed    Spouse name: Not on file   Number of children: Not on file   Years of education: Not on file   Highest education level: Not on file  Occupational History   Occupation: retired  Tobacco Use   Smoking status: Never   Smokeless tobacco: Never  Vaping Use   Vaping Use: Never used  Substance and Sexual Activity   Alcohol use: No   Drug use: No   Sexual activity: Not Currently  Other Topics Concern   Not on file  Social History Narrative    Long term resident of [facility]    Social Determinants of Health   Financial Resource Strain: Not on file  Food Insecurity: Not on file  Transportation Needs: Not on file  Physical Activity: Not on file  Stress: Not on file  Social Connections: Not on file  Intimate Partner Violence: Not on file   Family History  Problem Relation Age of Onset   Heart attack Father    Heart disease Neg Hx       VITAL SIGNS BP (!) 129/57   Pulse 89   Temp (!) 97 F (36.1 C)   Resp 18   Ht 5' (1.524 m)   Wt 118 lb 3.2 oz (53.6 kg)   SpO2 93%   BMI 23.08 kg/m   Outpatient Encounter Medications as of 07/31/2021  Medication Sig   acetaminophen (TYLENOL) 325 MG tablet Take 650 mg by mouth in the morning and at bedtime. Do not exceed 3 gm within 24 hours from all sources.   calcium carbonate (TUMS EX) 750 MG chewable tablet Chew 1 tablet by mouth 3 (three) times daily.   Cholecalciferol 50 MCG (2000 UT) CAPS  Take 1 capsule by mouth daily in the afternoon. Special Instructions: for supplement for osteoporosis   losartan (COZAAR) 100 MG tablet Take 100 mg by mouth daily.   mirtazapine (REMERON) 7.5 MG tablet Take 7.5 mg by mouth at bedtime.   NON FORMULARY Diet:Dysphagia 3 (mechanical soft), thin liquids. OK to have soft sandwiches per LPN request.   omeprazole (PRILOSEC OTC) 20 MG tablet Take 20 mg by mouth daily.   OXYGEN Inhale 3 L into the lungs continuous.   [DISCONTINUED] dextromethorphan-guaiFENesin (ROBITUSSIN-DM) 10-100 MG/5ML liquid Take 10 mLs by mouth every 6 (six) hours as needed for cough.   [DISCONTINUED] loperamide (IMODIUM) 2 MG capsule Take 2 mg by mouth every 4 (four) hours as needed for diarrhea or loose stools.   No facility-administered encounter medications on file as of 07/31/2021.     SIGNIFICANT DIAGNOSTIC EXAMS   PREVIOUS  12-16-19: DEXA: t score -5.678   NO NEW EXAMS.    LABS REVIEWED PREVIOUS    11-09-20: wbc 7.0; hgb 12.6; hct 38.8; mcv 96.3 plt 204; glucose  98; bun 14; creat 0.65; k+ 3.7; na++ 140; ca 8.5; GFR >60; liver normal albumin 3.2; tsh 0.687 free t4: 1.15  TODAY  07-11-21: wbc 5.3; hgb 11.3; hct 35.4; mcv 94.9 plt 211; glucose 86; bun 19; creat 0.58; k+ 3.9; na++ 143; ca 8.9; gfr>60; protein 6.1; albumin 3.0   Review of Systems  Unable to perform ROS: Dementia    Physical Exam Constitutional:      General: She is not in acute distress.    Appearance: She is well-developed. She is not diaphoretic.  Neck:     Thyroid: No thyroid mass.     Comments:  3.1 cm nodule Cardiovascular:     Rate and Rhythm: Normal rate and regular rhythm.     Pulses: Normal pulses.     Heart sounds: Normal heart sounds.  Pulmonary:     Effort: Pulmonary effort is normal. No respiratory distress.     Breath sounds: Normal breath sounds.  Abdominal:     General: Bowel sounds are normal. There is no distension.     Palpations: Abdomen is soft.     Tenderness: There is no abdominal tenderness.  Musculoskeletal:        General: Normal range of motion.     Cervical back: Neck supple.     Right lower leg: No edema.     Left lower leg: No edema.     Comments: Kyphosis   Lymphadenopathy:     Cervical: No cervical adenopathy.  Skin:    General: Skin is warm and dry.  Neurological:     Mental Status: She is alert. Mental status is at baseline.  Psychiatric:        Mood and Affect: Mood normal.      ASSESSMENT/ PLAN:  TODAY  Aortic atherosclerosis (ct 02-17-19) will monitor no statin due to advanced age  87. Major depression chronic: will continue remeron 7.5 mg nighty (failed wean X 2) for appetite.   3. Chronic respiratory failure with hypoxia: is presently not on 02 will monitor   4. Angina pectoris unspecified; no reports of chest pain present.   PREVIOUS   5. GERD without esophagitis: is stable has history of GI bleed; will continue prilosec 20 mg daily   6. Vascular dementia without behavioral disturbance: current weight is 116 pounds;  is off aricept will continue remeron 7.5 mg nightly to help maintain appetite.   7. Primary osteoarthritis: multiple joints:  is stable will continue tylenol 1 gm three times daily .   8. Post menopausal osteoporosis: t score: -5.678 is off fosamax due to advanced age.   9. Goiter: tsh 0.687 free t4: 0.89  10. Essential hypertension: b/p 128/71: will continue cozaar 100 mg daily   11. Severe protein calorie malnutrition: albumin 3.2 will continue supplements as directed.     Kendra Innocent NP [facility]  call [phone]

## 2021-08-02 ENCOUNTER — Non-Acute Institutional Stay (SKILLED_NURSING_FACILITY): Payer: Medicare Other | Admitting: Adult Health

## 2021-08-02 DIAGNOSIS — J9611 Chronic respiratory failure with hypoxia: Secondary | ICD-10-CM | POA: Diagnosis not present

## 2021-08-02 DIAGNOSIS — I700 Atherosclerosis of aorta: Secondary | ICD-10-CM

## 2021-08-02 DIAGNOSIS — F339 Major depressive disorder, recurrent, unspecified: Secondary | ICD-10-CM

## 2021-08-03 ENCOUNTER — Encounter: Payer: Self-pay | Admitting: Adult Health

## 2021-08-03 NOTE — Progress Notes (Signed)
Location:  [facility] Room Number: 114 Place of Service:  SNF (31)   CODE STATUS: dnr   Allergies  Allergen Reactions   Hydrocodone-Acetaminophen Nausea And Vomiting    Chief Complaint  Patient presents with   Acute Visit    Care plan meeting    HPI:  We have come together for her care plan meeting. BIMS 5/15 mood 9/30: not sleeping well; decreased energy; nervous; tires easily. She is nonambulatory with no falls. She requires assist with her adls. She is incontinent of bladder and bowel. Dietary: weight is 118.2 pounds is dependent upon remeron to maintain her appetite 50-100% of meals supervision with eating does go to restorative dining at lunch. No therapy at this time. She continues to be followed for her chronic illnesses including: Aortic atherosclerosis Chronic respiratory failure with hypoxia  Major depression recurrent chronic   Past Medical History:  Diagnosis Date   Anxiety    Dementia (HCC)    GERD (gastroesophageal reflux disease)    Goiter    Hypertension    Major depression, recurrent, chronic (HCC) 02/22/2019   Osteoarthritis    Osteoporosis    Vitamin D insufficiency     Past Surgical History:  Procedure Laterality Date   APPENDECTOMY     KIDNEY STONE SURGERY     ORIF HIP FRACTURE Right 11/20/2012   Procedure: OPEN REDUCTION INTERNAL FIXATION RIGHT HIP;  Surgeon: Darreld Mclean, MD;  Location: [facility];  Service: Orthopedics;  Laterality: Right;   stress fractures of legs     TUBAL LIGATION      Social History   Socioeconomic History   Marital status: Widowed    Spouse name: Not on file   Number of children: Not on file   Years of education: Not on file   Highest education level: Not on file  Occupational History   Occupation: retired  Tobacco Use   Smoking status: Never   Smokeless tobacco: Never  Vaping Use   Vaping Use: Never used  Substance and Sexual Activity   Alcohol use: No   Drug use: No   Sexual activity:  Not Currently  Other Topics Concern   Not on file  Social History Narrative   Long term resident of [facility]    Social Determinants of Health   Financial Resource Strain: Not on file  Food Insecurity: Not on file  Transportation Needs: Not on file  Physical Activity: Not on file  Stress: Not on file  Social Connections: Not on file  Intimate Partner Violence: Not on file   Family History  Problem Relation Age of Onset   Heart attack Father    Heart disease Neg Hx       VITAL SIGNS BP (!) 129/57   Pulse 88   Temp (!) 97 F (36.1 C)   Ht 5' (1.524 m)   Wt 118 lb 3.2 oz (53.6 kg)   BMI 23.08 kg/m   Outpatient Encounter Medications as of 08/02/2021  Medication Sig   acetaminophen (TYLENOL) 325 MG tablet Take 650 mg by mouth in the morning and at bedtime. Do not exceed 3 gm within 24 hours from all sources.   calcium carbonate (TUMS EX) 750 MG chewable tablet Chew 1 tablet by mouth 3 (three) times daily.   Cholecalciferol 50 MCG (2000 UT) CAPS Take 1 capsule by mouth daily in the afternoon. Special Instructions: for supplement for osteoporosis   losartan (COZAAR) 100 MG tablet Take 100 mg by mouth daily.  mirtazapine (REMERON) 7.5 MG tablet Take 7.5 mg by mouth at bedtime.   NON FORMULARY Diet:Dysphagia 3 (mechanical soft), thin liquids. OK to have soft sandwiches per LPN request.   omeprazole (PRILOSEC OTC) 20 MG tablet Take 20 mg by mouth daily.   OXYGEN Inhale 3 L into the lungs continuous.   No facility-administered encounter medications on file as of 08/02/2021.     SIGNIFICANT DIAGNOSTIC EXAMS  PREVIOUS  12-16-19: DEXA: t score -5.678   NO NEW EXAMS.    LABS REVIEWED PREVIOUS    11-09-20: wbc 7.0; hgb 12.6; hct 38.8; mcv 96.3 plt 204; glucose 98; bun 14; creat 0.65; k+ 3.7; na++ 140; ca 8.5; GFR >60; liver normal albumin 3.2; tsh 0.687 free t4: 1.15 07-11-21: wbc 5.3; hgb 11.3; hct 35.4; mcv 94.9 plt 211; glucose 86; bun 19; creat 0.58; k+ 3.9; na++ 143; ca 8.9;  gfr>60; protein 6.1; albumin 3.0   NO NEW LABS.   Review of Systems  Unable to perform ROS: Dementia   Physical Exam Constitutional:      General: She is not in acute distress.    Appearance: She is well-developed. She is not diaphoretic.  Neck:     Thyroid: Thyroid mass and thyromegaly present.     Comments: 3.1 cm nodule  Cardiovascular:     Rate and Rhythm: Normal rate and regular rhythm.     Heart sounds: Normal heart sounds.  Pulmonary:     Effort: Pulmonary effort is normal. No respiratory distress.     Breath sounds: Normal breath sounds.  Abdominal:     General: Bowel sounds are normal. There is no distension.     Palpations: Abdomen is soft.     Tenderness: There is no abdominal tenderness.  Musculoskeletal:        General: Normal range of motion.     Cervical back: Neck supple.     Right lower leg: No edema.     Left lower leg: No edema.  Lymphadenopathy:     Cervical: No cervical adenopathy.  Skin:    General: Skin is warm and dry.  Neurological:     Mental Status: She is alert. Mental status is at baseline.  Psychiatric:        Mood and Affect: Mood normal.       ASSESSMENT/ PLAN:  TODAY  Aortic atherosclerosis Chronic respiratory failure with hypoxia Major depression recurrent chronic   Will continue current medications Will continue current plan of care Will continue to monitor her status.   Time spent with patient 40 minutes: medications; plan of care; dietary    [name] NP [facility]  call [phone]

## 2021-08-13 DIAGNOSIS — I739 Peripheral vascular disease, unspecified: Secondary | ICD-10-CM | POA: Diagnosis not present

## 2021-08-13 DIAGNOSIS — M2041 Other hammer toe(s) (acquired), right foot: Secondary | ICD-10-CM | POA: Diagnosis not present

## 2021-08-13 DIAGNOSIS — B351 Tinea unguium: Secondary | ICD-10-CM | POA: Diagnosis not present

## 2021-08-13 DIAGNOSIS — M2042 Other hammer toe(s) (acquired), left foot: Secondary | ICD-10-CM | POA: Diagnosis not present

## 2021-08-16 ENCOUNTER — Non-Acute Institutional Stay (SKILLED_NURSING_FACILITY): Payer: Medicare Other | Admitting: Internal Medicine

## 2021-08-16 ENCOUNTER — Encounter: Payer: Self-pay | Admitting: Internal Medicine

## 2021-08-16 DIAGNOSIS — I10 Essential (primary) hypertension: Secondary | ICD-10-CM

## 2021-08-16 DIAGNOSIS — F01C0 Vascular dementia, severe, without behavioral disturbance, psychotic disturbance, mood disturbance, and anxiety: Secondary | ICD-10-CM | POA: Diagnosis not present

## 2021-08-16 DIAGNOSIS — E43 Unspecified severe protein-calorie malnutrition: Secondary | ICD-10-CM | POA: Diagnosis not present

## 2021-08-16 DIAGNOSIS — D649 Anemia, unspecified: Secondary | ICD-10-CM

## 2021-08-16 DIAGNOSIS — E049 Nontoxic goiter, unspecified: Secondary | ICD-10-CM

## 2021-08-16 NOTE — Progress Notes (Signed)
   NURSING HOME LOCATION:  [facility] ROOM NUMBER: 114 W  CODE STATUS: DNR  PCP:  [name] NP  This is a nursing facility follow up visit of chronic medical diagnoses & to document compliance with Regulation 483.30 (c) in The Long Term Care Survey Manual Phase 2 which mandates caregiver visit ( visits can alternate among physician, PA or NP as per statutes) within 10 days of 30 days / 60 days/ 90 days post admission to SNF date    Interim medical record and care since last SNF visit was updated with review of diagnostic studies and change in clinical status since last visit were documented.  HPI: She is a permanent resident of this facility with medical diagnoses of essential hypertension, GERD, history of goiter, history of recurrent major depression, osteoporosis,history of nephrolithiasis, aortic atherosclerosis, and vitamin D deficiency.  Labs are current as of 07/11/2021.  Protein/caloric malnutrition was documented with an albumin of 3 and total protein of 6.1.  She has developed a slight normochromic, normocytic anemia with H/H of 11.3/35.4, down from 12.6/38.8.  Free T4 had been slightly elevated on 11/09/2020 with a value of 1.15; repeat free T4 on 6/7 was normal at 0.89.  Despite the slightly elevated free T4 TSH was low normal at 0.687 on 11/09/2020.  Review of systems: Dementia invalidated responses.  When asked if she were having any symptoms ,her response was "lots but I do not want anyone to know."  She then began to confabulate about being pregnant with her fourth child.  She said she will inform her husband that "3 is enough."  Physical exam:  Pertinent or positive findings: She appears her stated age.  She is thin and suboptimally nourished.  As noted she confabulates.  She is wearing nasal oxygen.  Poor dentition with erosions & missing teeth.There is a ping-pong ball sized goiter over the anterior neck.  Pedal pulses cannot be palpated.  She has 1/2+ edema  at the ankles.  Limb wasting present.  General appearance: no acute distress, increased work of breathing is present.   Lymphatic: No lymphadenopathy about the head, neck, axilla. Eyes: No conjunctival inflammation or lid edema is present. There is no scleral icterus. Ears:  External ear exam shows no significant lesions or deformities.   Nose:  External nasal examination shows no deformity or inflammation. Nasal mucosa are pink and moist without lesions, exudates Heart:  Normal rate and regular rhythm. S1 and S2 normal without gallop, murmur, click, rub .  Lungs: Chest clear to auscultation without wheezes, rhonchi, rales, rubs. Abdomen: Bowel sounds are normal. Abdomen is soft and nontender with no organomegaly, hernias, masses. GU: Deferred  Extremities:  No cyanosis, clubbing  Neurologic exam :Balance, Rhomberg, finger to nose testing could not be completed due to clinical state Skin: Warm & dry w/o tenting. No significant lesions or rash.  See summary under each active problem in the Problem List with associated updated therapeutic plan

## 2021-08-16 NOTE — Assessment & Plan Note (Signed)
Goiter is stable; no cervical lymphadenopathy present.  Free T4 now normal.  Continue to monitor.

## 2021-08-16 NOTE — Assessment & Plan Note (Addendum)
Mild normochromic, normocytic anemia has recurred with H/H of 11.3/35.4, down from values of 12.6/38.8.  No bleeding dyscrasias reported by staff.Continue to monitor.

## 2021-08-16 NOTE — Assessment & Plan Note (Addendum)
Albumin has decreased to 3 and total protein is 6.1.  Limb atrophy present.  Nutritionist to follow at [facility].

## 2021-08-16 NOTE — Assessment & Plan Note (Signed)
BP controlled on relatively high-dose ARB; no change in antihypertensive medications

## 2021-08-16 NOTE — Patient Instructions (Signed)
See assessment and plan under each diagnosis in the problem list and acutely for this visit 

## 2021-08-16 NOTE — Assessment & Plan Note (Addendum)
Today she informed me that she is pregnant with her fourth child.  No behavioral issues reported by staff.

## 2021-08-21 ENCOUNTER — Other Ambulatory Visit (HOSPITAL_COMMUNITY)
Admission: RE | Admit: 2021-08-21 | Discharge: 2021-08-21 | Disposition: A | Discharge status: Home / Self Care | Payer: Medicare Other | Source: Skilled Nursing Facility | Attending: Adult Health | Admitting: Adult Health

## 2021-08-21 DIAGNOSIS — N390 Urinary tract infection, site not specified: Secondary | ICD-10-CM | POA: Diagnosis not present

## 2021-08-21 LAB — URINALYSIS, ROUTINE W REFLEX MICROSCOPIC
Bilirubin Urine: NEGATIVE
Glucose, UA: NEGATIVE mg/dL
Ketones, ur: NEGATIVE mg/dL
Nitrite: NEGATIVE
Protein, ur: 30 mg/dL — AB
Specific Gravity, Urine: 1.01 (ref 1.005–1.030)
WBC, UA: >50 WBC/hpf — ABNORMAL HIGH (ref 0–5)
pH: 7 (ref 5.0–8.0)

## 2021-08-23 ENCOUNTER — Non-Acute Institutional Stay (SKILLED_NURSING_FACILITY): Payer: Medicare Other | Admitting: Adult Health

## 2021-08-23 ENCOUNTER — Encounter: Payer: Self-pay | Admitting: Adult Health

## 2021-08-23 DIAGNOSIS — N390 Urinary tract infection, site not specified: Secondary | ICD-10-CM

## 2021-08-23 DIAGNOSIS — A499 Bacterial infection, unspecified: Secondary | ICD-10-CM

## 2021-08-23 LAB — URINE CULTURE: Culture: >=100000 — AB

## 2021-08-23 NOTE — Progress Notes (Signed)
Location:  [facility] Room Number: 114-W Place of Service:  SNF (31)   CODE STATUS: DNR  Allergies  Allergen Reactions   Hydrocodone-Acetaminophen Nausea And Vomiting    Chief Complaint  Patient presents with   Acute Visit    UTI    HPI:  She had a urine culture done due to foul urine; flank pain and urinary pressure. There were no reports of fevers present. Her culture grew morganella morganii   Past Medical History:  Diagnosis Date   Anxiety    Dementia (HCC)    GERD (gastroesophageal reflux disease)    Goiter    Hypertension    Major depression, recurrent, chronic (HCC) 02/22/2019   Osteoarthritis    Osteoporosis    Vitamin D insufficiency     Past Surgical History:  Procedure Laterality Date   APPENDECTOMY     KIDNEY STONE SURGERY     ORIF HIP FRACTURE Right 11/20/2012   Procedure: OPEN REDUCTION INTERNAL FIXATION RIGHT HIP;  Surgeon: Darreld Mclean, MD;  Location: [facility];  Service: Orthopedics;  Laterality: Right;   stress fractures of legs     TUBAL LIGATION      Social History   Socioeconomic History   Marital status: Widowed    Spouse name: Not on file   Number of children: Not on file   Years of education: Not on file   Highest education level: Not on file  Occupational History   Occupation: retired  Tobacco Use   Smoking status: Never   Smokeless tobacco: Never  Vaping Use   Vaping Use: Never used  Substance and Sexual Activity   Alcohol use: No   Drug use: No   Sexual activity: Not Currently  Other Topics Concern   Not on file  Social History Narrative   Long term resident of [facility]    Social Determinants of Health   Financial Resource Strain: Not on file  Food Insecurity: Not on file  Transportation Needs: Not on file  Physical Activity: Not on file  Stress: Not on file  Social Connections: Not on file  Intimate Partner Violence: Not on file   Family History  Problem Relation Age of Onset   Heart attack  Father    Heart disease Neg Hx       VITAL SIGNS BP (!) 102/54   Pulse 69   Temp (!) 97.2 F (36.2 C)   Resp 20   Ht 5' (1.524 m)   Wt 115 lb 6.4 oz (52.3 kg)   BMI 22.54 kg/m   Outpatient Encounter Medications as of 08/23/2021  Medication Sig   acetaminophen (TYLENOL) 325 MG tablet Take 650 mg by mouth in the morning and at bedtime. Do not exceed 3 gm within 24 hours from all sources.   calcium carbonate (TUMS EX) 750 MG chewable tablet Chew 1 tablet by mouth 3 (three) times daily.   Cholecalciferol 50 MCG (2000 UT) CAPS Take 1 capsule by mouth daily in the afternoon. Special Instructions: for supplement for osteoporosis   losartan (COZAAR) 100 MG tablet Take 100 mg by mouth daily.   mirtazapine (REMERON) 7.5 MG tablet Take 7.5 mg by mouth at bedtime.   NON FORMULARY Diet:Dysphagia 3 (mechanical soft), thin liquids. OK to have soft sandwiches per LPN request.   omeprazole (PRILOSEC OTC) 20 MG tablet Take 20 mg by mouth daily.   OXYGEN Inhale 2 L into the lungs continuous.   No facility-administered encounter medications on file as of  08/23/2021.     SIGNIFICANT DIAGNOSTIC EXAMS   PREVIOUS  12-16-19: DEXA: t score -5.678   NO NEW EXAMS.    LABS REVIEWED PREVIOUS    11-09-20: wbc 7.0; hgb 12.6; hct 38.8; mcv 96.3 plt 204; glucose 98; bun 14; creat 0.65; k+ 3.7; na++ 140; ca 8.5; GFR >60; liver normal albumin 3.2; tsh 0.687 free t4: 1.15 07-11-21: wbc 5.3; hgb 11.3; hct 35.4; mcv 94.9 plt 211; glucose 86; bun 19; creat 0.58; k+ 3.9; na++ 143; ca 8.9; gfr>60; protein 6.1; albumin 3.0   TODAY  08-21-21: urine culture; morganella morganii : cipro   Review of Systems  Unable to perform ROS: Dementia   Physical Exam Constitutional:      General: She is not in acute distress.    Appearance: She is well-developed. She is not diaphoretic.  Neck:     Thyroid: Thyroid mass and thyromegaly present.     Comments: 3.1 cm  Cardiovascular:     Rate and Rhythm: Normal rate and  regular rhythm.     Pulses: Normal pulses.     Heart sounds: Normal heart sounds.  Pulmonary:     Effort: Pulmonary effort is normal. No respiratory distress.     Breath sounds: Normal breath sounds.  Abdominal:     General: Bowel sounds are normal. There is no distension.     Palpations: Abdomen is soft.     Tenderness: There is no abdominal tenderness.  Musculoskeletal:        General: Normal range of motion.     Cervical back: Neck supple.     Right lower leg: No edema.     Left lower leg: No edema.     Comments: Mid flank discomfort   Lymphadenopathy:     Cervical: No cervical adenopathy.  Skin:    General: Skin is warm and dry.  Neurological:     Mental Status: She is alert. Mental status is at baseline.  Psychiatric:        Mood and Affect: Mood normal.       ASSESSMENT/ PLAN:  TODAY  UTI (urinary tract infection) bacterial: will begin cipro 500 mg twice daily through 08-30-21.    [name] NP [facility]   call [phone]

## 2021-08-27 ENCOUNTER — Encounter: Payer: Self-pay | Admitting: Adult Health

## 2021-08-27 ENCOUNTER — Non-Acute Institutional Stay (SKILLED_NURSING_FACILITY): Payer: Medicare Other | Admitting: Adult Health

## 2021-08-27 DIAGNOSIS — Z0000 Encounter for general adult medical examination without abnormal findings: Secondary | ICD-10-CM | POA: Diagnosis not present

## 2021-08-27 NOTE — Patient Instructions (Signed)
  [name] , Thank you for taking time to come for your Medicare Wellness Visit. I appreciate your ongoing commitment to your health goals. Please review the following plan we discussed and let me know if I can assist you in the future.   These are the goals we discussed:  Goals      DIET - INCREASE WATER INTAKE     Follow up with Primary Care Provider     General - Client will not be readmitted within 30 days (C-SNP)        This is a list of the screening recommended for you and due dates:  Health Maintenance  Topic Date Due   Flu Shot  09/04/2021   COVID-19 Vaccine (5 - Moderna series) 10/27/2021   Tetanus Vaccine  04/13/2031   Pneumonia Vaccine  Completed   DEXA scan (bone density measurement)  Completed   Zoster (Shingles) Vaccine  Completed   HPV Vaccine  Aged Out

## 2021-08-27 NOTE — Progress Notes (Signed)
Subjective:   [name] is a 88 y.o. female who presents for Medicare Annual (Subsequent) preventive examination.  Review of Systems    Review of Systems  Unable to perform ROS: Dementia   Cardiac Risk Factors include: advanced age (>47men, >71 women);hypertension;sedentary lifestyle     Objective:    Today's Vitals   08/27/21 0922  BP: 127/62  Pulse: 74  Resp: 20  Temp: 98.3 F (36.8 C)  SpO2: 97%  Weight: 115 lb 6.4 oz (52.3 kg)  Height: 5' (1.524 m)   Body mass index is 22.54 kg/m.     08/27/2021    9:29 AM 07/31/2021    8:37 AM 07/05/2021   12:30 PM 05/04/2021    9:44 AM 04/18/2021    8:46 AM 03/19/2021    9:42 AM 02/09/2021    9:26 AM  Advanced Directives  Does Patient Have a Medical Advance Directive? Yes Yes Yes Yes Yes Yes Yes  Type of Paramedic of [place];Living will;Out of facility DNR (pink MOST or yellow form) [place];Living will;Out of facility DNR (pink MOST or yellow form) [place];Living will;Out of facility DNR (pink MOST or yellow form) Living will;Out of facility DNR (pink MOST or yellow form);Healthcare Power of Attorney Living will;Out of facility DNR (pink MOST or yellow form) Living will;Out of facility DNR (pink MOST or yellow form);Healthcare Power of [place];Living will;Out of facility DNR (pink MOST or yellow form)  Does patient want to make changes to medical advance directive? No - Patient declined No - Patient declined No - Patient declined No - Patient declined No - Patient declined No - Patient declined No - Patient declined  Copy of [place] in Chart? Yes - validated most recent copy scanned in chart (See row information) Yes - validated most recent copy scanned in chart (See row information) Yes - validated most recent copy scanned in chart (See row information) Yes - validated most recent copy scanned in chart (See row  information) Yes - validated most recent copy scanned in chart (See row information) Yes - validated most recent copy scanned in chart (See row information) Yes - validated most recent copy scanned in chart (See row information)  Pre-existing out of facility DNR order (yellow form or pink MOST form) Yellow form placed in chart (order not valid for inpatient use) Yellow form placed in chart (order not valid for inpatient use) Yellow form placed in chart (order not valid for inpatient use) Yellow form placed in chart (order not valid for inpatient use) Yellow form placed in chart (order not valid for inpatient use) Yellow form placed in chart (order not valid for inpatient use) Yellow form placed in chart (order not valid for inpatient use)    Current Medications (verified) Outpatient Encounter Medications as of 08/27/2021  Medication Sig   acetaminophen (TYLENOL) 325 MG tablet Take 650 mg by mouth in the morning and at bedtime. Do not exceed 3 gm within 24 hours from all sources.   calcium carbonate (TUMS EX) 750 MG chewable tablet Chew 1 tablet by mouth 3 (three) times daily.   Cholecalciferol 50 MCG (2000 UT) CAPS Take 1 capsule by mouth daily in the afternoon. Special Instructions: for supplement for osteoporosis   ciprofloxacin (CIPRO) 500 MG tablet Take 500 mg by mouth 2 (two) times daily. morganella morganii uti   losartan (COZAAR) 100 MG tablet Take 100 mg by mouth  daily.   mirtazapine (REMERON) 7.5 MG tablet Take 7.5 mg by mouth at bedtime.   NON FORMULARY Diet:Dysphagia 3 (mechanical soft), thin liquids. OK to have soft sandwiches per LPN request.   omeprazole (PRILOSEC OTC) 20 MG tablet Take 20 mg by mouth daily.   OXYGEN Inhale 2 L into the lungs continuous.   No facility-administered encounter medications on file as of 08/27/2021.    Allergies (verified) Hydrocodone-acetaminophen   History: Past Medical History:  Diagnosis Date   Anxiety    Dementia ([place])    GERD  (gastroesophageal reflux disease)    Goiter    Hypertension    Major depression, recurrent, chronic ([place]) 02/22/2019   Osteoarthritis    Osteoporosis    Vitamin D insufficiency    Past Surgical History:  Procedure Laterality Date   APPENDECTOMY     KIDNEY STONE SURGERY     ORIF HIP FRACTURE Right 11/20/2012   Procedure: OPEN REDUCTION INTERNAL FIXATION RIGHT HIP;  Surgeon: Sanjuana Kava, MD;  Location: [facility];  Service: Orthopedics;  Laterality: Right;   stress fractures of legs     TUBAL LIGATION     Family History  Problem Relation Age of Onset   Heart attack Father    Heart disease Neg Hx    Social History   Socioeconomic History   Marital status: Widowed    Spouse name: Not on file   Number of children: Not on file   Years of education: Not on file   Highest education level: Not on file  Occupational History   Occupation: retired  Tobacco Use   Smoking status: Never   Smokeless tobacco: Never  Vaping Use   Vaping Use: Never used  Substance and Sexual Activity   Alcohol use: No   Drug use: No   Sexual activity: Not Currently  Other Topics Concern   Not on file  Social History Narrative   Long term resident of [facility]    Social Determinants of Health   Financial Resource Strain: Not on file  Food Insecurity: Not on file  Transportation Needs: Not on file  Physical Activity: Not on file  Stress: Not on file  Social Connections: Not on file    Tobacco Counseling Counseling given: Not Answered   Clinical Intake:  Pre-visit preparation completed: Yes  Pain : No/denies pain     BMI - recorded: 22.45 Nutritional Status: BMI of 19-24  Normal Nutritional Risks: Unintentional weight loss Diabetes: No  How often do you need to have someone help you when you read instructions, pamphlets, or other written materials from your doctor or pharmacy?: 5 - Always  Diabetic?no  Interpreter Needed?: No  Comments: lives in SNF has dementia   Activities of  Daily Living    08/27/2021    3:43 PM  In your present state of health, do you have any difficulty performing the following activities:  Hearing? 0  Vision? 0  Difficulty concentrating or making decisions? 0  Walking or climbing stairs? 1  Dressing or bathing? 1  Doing errands, shopping? 1  Preparing Food and eating ? Y  Using the Toilet? Y  In the past six months, have you accidently leaked urine? Y  Do you have problems with loss of bowel control? Y  Managing your Medications? Y  Managing your Finances? Y  Housekeeping or managing your Housekeeping? Y    Patient Care Team: Gerlene Fee, NP as PCP - General (Geriatric Medicine) [name], MD (Inactive) as PCP -  Cardiology [facility]  Indicate any recent Medical Services you may have received from other than [facility] providers in the past year (date may be approximate).     Assessment:   This is a routine wellness examination for Ambika.  Hearing/Vision screen No results found.  Dietary issues and exercise activities discussed: Current Exercise Habits: The patient does not participate in regular exercise at present, Exercise limited by: None identified   Goals Addressed             This Visit's Progress    DIET - INCREASE WATER INTAKE   On track    Follow up with Primary Care Provider   On track    General - Client will not be readmitted within 30 days (C-SNP)   On track      Depression Screen    08/27/2021    3:42 PM 07/09/2021    1:25 PM 03/26/2021   12:32 PM 02/13/2021   12:26 PM 08/22/2020    9:47 AM 06/25/2019    1:46 PM  PHQ 2/9 Scores  PHQ - 2 Score     0   Exception Documentation  Other- indicate reason in comment box Medical reason   Medical reason  Not completed unable to participate unable to participate unable to participate unable to participate      Fall Risk    08/27/2021    3:41 PM 07/09/2021    1:25 PM 03/26/2021   12:33 PM 08/22/2020     9:46 AM 06/25/2019    1:45 PM  Fall Risk   Falls in the past year? 1 0 0 0 0  Number falls in past yr: 1 0 0 0   Injury with Fall? 0 0 0 0   Risk for fall due to : History of fall(s);Impaired balance/gait;Impaired mobility Impaired balance/gait;Impaired mobility Impaired balance/gait;Impaired mobility Impaired balance/gait;Impaired mobility Impaired balance/gait;Impaired mobility    FALL RISK PREVENTION PERTAINING TO THE HOME:  Any stairs in or around the home? Yes  If so, are there any without handrails? No  Home free of loose throw rugs in walkways, pet beds, electrical cords, etc? Yes  Adequate lighting in your home to reduce risk of falls? Yes   ASSISTIVE DEVICES UTILIZED TO PREVENT FALLS:  Life alert? No  Use of a cane, walker or w/c? Yes  Grab bars in the bathroom? Yes  Shower chair or bench in shower? Yes  Elevated toilet seat or a handicapped toilet? Yes   TIMED UP AND GO:  Was the test performed? no unable to ambulate      Cognitive Function:    08/27/2021    3:44 PM 08/22/2020    9:52 AM  MMSE - Mini Mental State Exam  Not completed: Unable to complete Unable to complete        Immunizations Immunization History  Administered Date(s) Administered   Influenza,inj,Quad PF,6+ Mos 11/08/2020   Influenza-Unspecified 11/26/2018, 11/12/2019   MMR 04/06/2015   Moderna Covid-19 Vaccine Bivalent Booster 71yrs & up 11/28/2020   Moderna SARS-COV2 Booster Vaccination 05/17/2020, 06/26/2021   Moderna Sars-Covid-2 Vaccination 05/06/2019, 06/02/2019, 12/09/2019   PNEUMOCOCCAL CONJUGATE-20 07/20/2021   Pneumococcal Conjugate-13 04/06/2015   Pneumococcal Polysaccharide-23 11/16/2001   Tdap 03/08/2011, 04/12/2021   Zoster Recombinat (Shingrix) 10/03/2020, 01/05/2021    TDAP status: Up to date  Flu Vaccine status: Up to date  Pneumococcal vaccine status: Up to date  Covid-19 vaccine status: Completed vaccines  Qualifies for Shingles Vaccine? Yes  Zostavax  completed Yes   Shingrix Completed?: Yes  Screening Tests Health Maintenance  Topic Date Due   INFLUENZA VACCINE  09/04/2021   COVID-19 Vaccine (5 - Moderna series) 10/27/2021   TETANUS/TDAP  04/13/2031   Pneumonia Vaccine 43+ Years old  Completed   DEXA SCAN  Completed   Zoster Vaccines- Shingrix  Completed   HPV VACCINES  Aged Out    Health Maintenance  There are no preventive care reminders to display for this patient.  Colorectal cancer screening: No longer required.   Mammogram status: No longer required due to advanced age .    Lung Cancer Screening: (Low Dose CT Chest recommended if Age 77-80 years, 30 pack-year currently smoking OR have quit w/in 15years.) does not qualify.   Lung Cancer Screening Referral: n/a   Additional Screening:  Hepatitis C Screening: does not qualify; Completed  due to advanced age   Vision Screening: Recommended annual ophthalmology exams for early detection of glaucoma and other disorders of the eye. Is the patient up to date with their annual eye exam?  Yes  Who is the provider or what is the name of the office in which the patient attends annual eye exams?  If pt is not established with a provider, would they like to be referred to a provider to establish care? No .   Dental Screening: Recommended annual dental exams for proper oral hygiene  Community Resource Referral / Chronic Care Management: CRR required this visit?  No   CCM required this visit?  No      Plan:     I have personally reviewed and noted the following in the patient's chart:   Medical and social history Use of alcohol, tobacco or illicit drugs  Current medications and supplements including opioid prescriptions.  Functional ability and status Nutritional status Physical activity Advanced directives List of other physicians Hospitalizations, surgeries, and ER visits in previous 12 months Vitals Screenings to include cognitive, depression, and  falls Referrals and appointments  In addition, I have reviewed and discussed with patient certain preventive protocols, quality metrics, and best practice recommendations. A written personalized care plan for preventive services as well as general preventive health recommendations were provided to patient.     Gerlene Fee, NP   08/27/2021   Nurse Notes: this test was performed by myself at facility

## 2021-09-26 ENCOUNTER — Encounter: Payer: Self-pay | Admitting: Adult Health

## 2021-09-26 ENCOUNTER — Non-Acute Institutional Stay (SKILLED_NURSING_FACILITY): Payer: Medicare Other | Admitting: Adult Health

## 2021-09-26 DIAGNOSIS — I10 Essential (primary) hypertension: Secondary | ICD-10-CM | POA: Diagnosis not present

## 2021-09-26 DIAGNOSIS — K219 Gastro-esophageal reflux disease without esophagitis: Secondary | ICD-10-CM

## 2021-09-26 DIAGNOSIS — F01C0 Vascular dementia, severe, without behavioral disturbance, psychotic disturbance, mood disturbance, and anxiety: Secondary | ICD-10-CM | POA: Diagnosis not present

## 2021-09-26 NOTE — Progress Notes (Signed)
Location:  [facility] Room Number: 114-W Place of Service:  SNF (31)   CODE STATUS: DNR  Allergies  Allergen Reactions   Hydrocodone-Acetaminophen Nausea And Vomiting    Chief Complaint  Patient presents with   Medical Management of Chronic Issues                                   Essential hypertension:  GERD without esophagitis: Vascular dementia without behavioral disturbance     HPI:  She is a 88 year old long term resident of this facility being seen for the management of her chronic illnesses:   Essential hypertension:  GERD without esophagitis: Vascular dementia without behavioral disturbance . Her weight is stable; remeron is helping her to maintain her body weight. She has failed twice coming off this medication. There are no reports of uncontrolled pain. No reports of anxiety or agitation.   Past Medical History:  Diagnosis Date   Anxiety    Dementia (HCC)    GERD (gastroesophageal reflux disease)    Goiter    Hypertension    Major depression, recurrent, chronic (HCC) 02/22/2019   Osteoarthritis    Osteoporosis    Vitamin D insufficiency     Past Surgical History:  Procedure Laterality Date   APPENDECTOMY     KIDNEY STONE SURGERY     ORIF HIP FRACTURE Right 11/20/2012   Procedure: OPEN REDUCTION INTERNAL FIXATION RIGHT HIP;  Surgeon: Darreld Mclean, MD;  Location: [facility];  Service: Orthopedics;  Laterality: Right;   stress fractures of legs     TUBAL LIGATION      Social History   Socioeconomic History   Marital status: Widowed    Spouse name: Not on file   Number of children: Not on file   Years of education: Not on file   Highest education level: Not on file  Occupational History   Occupation: retired  Tobacco Use   Smoking status: Never   Smokeless tobacco: Never  Vaping Use   Vaping Use: Never used  Substance and Sexual Activity   Alcohol use: No   Drug use: No   Sexual activity: Not Currently  Other Topics Concern    Not on file  Social History Narrative   Long term resident of [facility]    Social Determinants of Health   Financial Resource Strain: Not on file  Food Insecurity: Not on file  Transportation Needs: Not on file  Physical Activity: Not on file  Stress: Not on file  Social Connections: Not on file  Intimate Partner Violence: Not on file   Family History  Problem Relation Age of Onset   Heart attack Father    Heart disease Neg Hx       VITAL SIGNS BP 109/63   Pulse 72   Temp 98.6 F (37 C)   Resp (!) 22   Ht 5' (1.524 m)   Wt 117 lb (53.1 kg)   SpO2 95%   BMI 22.85 kg/m   Outpatient Encounter Medications as of 09/26/2021  Medication Sig   acetaminophen (TYLENOL) 325 MG tablet Take 650 mg by mouth in the morning and at bedtime. Do not exceed 3 gm within 24 hours from all sources.   calcium carbonate (TUMS EX) 750 MG chewable tablet Chew 1 tablet by mouth 3 (three) times daily.   Cholecalciferol 50 MCG (2000 UT) CAPS Take 1 capsule by mouth daily  in the afternoon. Special Instructions: for supplement for osteoporosis   losartan (COZAAR) 100 MG tablet Take 100 mg by mouth daily.   mirtazapine (REMERON) 7.5 MG tablet Take 7.5 mg by mouth at bedtime.   NON FORMULARY Diet:Dysphagia 3 (mechanical soft), thin liquids. OK to have soft sandwiches per LPN request.   omeprazole (PRILOSEC OTC) 20 MG tablet Take 20 mg by mouth daily.   OXYGEN Inhale 2 L into the lungs continuous.   [DISCONTINUED] ciprofloxacin (CIPRO) 500 MG tablet Take 500 mg by mouth 2 (two) times daily. morganella morganii uti   No facility-administered encounter medications on file as of 09/26/2021.     SIGNIFICANT DIAGNOSTIC EXAMS   PREVIOUS  09-20-21: dexa: t score: -5.527  NO NEW EXAMS.    LABS REVIEWED PREVIOUS    11-09-20: wbc 7.0; hgb 12.6; hct 38.8; mcv 96.3 plt 204; glucose 98; bun 14; creat 0.65; k+ 3.7; na++ 140; ca 8.5; GFR >60; liver normal albumin 3.2; tsh 0.687 free t4: 1.15 07-11-21: wbc 5.3;  hgb 11.3; hct 35.4; mcv 94.9 plt 211; glucose 86; bun 19; creat 0.58; k+ 3.9; na++ 143; ca 8.9; gfr>60; protein 6.1; albumin 3.0  08-21-21: urine culture; morganella morganii : cipro   NO NEW LABS.   Review of Systems  Reason unable to perform ROS: dementia.   Physical Exam Constitutional:      General: She is not in acute distress.    Appearance: She is well-developed. She is not diaphoretic.  Neck:     Thyroid: Thyroid mass and thyromegaly present.     Comments: 3.1 cm  Cardiovascular:     Rate and Rhythm: Normal rate and regular rhythm.     Pulses: Normal pulses.     Heart sounds: Normal heart sounds.  Pulmonary:     Effort: Pulmonary effort is normal. No respiratory distress.     Breath sounds: Normal breath sounds.  Abdominal:     General: Bowel sounds are normal. There is no distension.     Palpations: Abdomen is soft.     Tenderness: There is no abdominal tenderness.  Musculoskeletal:        General: Normal range of motion.     Cervical back: Neck supple.     Right lower leg: No edema.     Left lower leg: No edema.  Lymphadenopathy:     Cervical: No cervical adenopathy.  Skin:    General: Skin is warm and dry.  Neurological:     Mental Status: She is alert. Mental status is at baseline.  Psychiatric:        Mood and Affect: Mood normal.       ASSESSMENT/ PLAN:  TODAY  Essential hypertension: b/p 109/63 her blood pressure readings are soft will lower her cozaar to 50 mg daily   2. GERD without esophagitis: has history of GI bleed; will continue prilosec 20 mg daily  3. Vascular dementia without behavioral disturbance: weight is 117 pounds and is stable. Is off aricept will continue remeron 7.5 mg nightly to help with her appetite.; has failed 2 weans.   PREVIOUS   4. Primary osteoarthritis: multiple joints: is stable will continue tylenol 1 gm twice  daily .   5. Post menopausal osteoporosis: t score: -5.527 will continue ca++ and vitamin D supplements;  not on fosamax due to her advanced age.    6. Goiter: tsh 0.687 free t4: 0.89  7. Essential hypertension: b/p 109/63: will lower cozaar to 50 mg daily  8. Severe protein calorie malnutrition: albumin 3.2 will continue supplements as directed.   9. Aortic atherosclerosis (ct 02-17-19) will monitor no statin due to advanced age  68. Major depression chronic: will continue remeron 7.5 mg nighty (failed wean X 2) for appetite.   11. Chronic respiratory failure with hypoxia: is presently not on 02 will monitor   12. Angina pectoris unspecified; no reports of chest pain present.      [name] NP [facility]   call [phone]

## 2021-11-01 DIAGNOSIS — Z1152 Encounter for screening for COVID-19: Secondary | ICD-10-CM | POA: Diagnosis not present

## 2021-11-01 DIAGNOSIS — Z20828 Contact with and (suspected) exposure to other viral communicable diseases: Secondary | ICD-10-CM | POA: Diagnosis not present

## 2021-11-01 DIAGNOSIS — Z1159 Encounter for screening for other viral diseases: Secondary | ICD-10-CM | POA: Diagnosis not present

## 2021-11-01 DIAGNOSIS — Z1383 Encounter for screening for respiratory disorder NEC: Secondary | ICD-10-CM | POA: Diagnosis not present

## 2021-11-01 DIAGNOSIS — J9611 Chronic respiratory failure with hypoxia: Secondary | ICD-10-CM | POA: Diagnosis not present

## 2021-11-06 DIAGNOSIS — Z23 Encounter for immunization: Secondary | ICD-10-CM | POA: Diagnosis not present

## 2021-11-07 ENCOUNTER — Non-Acute Institutional Stay (SKILLED_NURSING_FACILITY): Payer: Medicare Other | Admitting: Adult Health

## 2021-11-07 ENCOUNTER — Encounter: Payer: Self-pay | Admitting: Adult Health

## 2021-11-07 DIAGNOSIS — I10 Essential (primary) hypertension: Secondary | ICD-10-CM | POA: Diagnosis not present

## 2021-11-07 DIAGNOSIS — M810 Age-related osteoporosis without current pathological fracture: Secondary | ICD-10-CM | POA: Diagnosis not present

## 2021-11-07 DIAGNOSIS — M159 Polyosteoarthritis, unspecified: Secondary | ICD-10-CM | POA: Diagnosis not present

## 2021-11-07 DIAGNOSIS — E049 Nontoxic goiter, unspecified: Secondary | ICD-10-CM | POA: Diagnosis not present

## 2021-11-07 NOTE — Progress Notes (Unsigned)
  Location:  [place] Room Number: [place] of Service:  SNF (31) Ok Edwards, NP   CODE STATUS: DNR  Allergies  Allergen Reactions   Hydrocodone-Acetaminophen Nausea And Vomiting    Chief Complaint  Patient presents with   Medical Management of Chronic Issues    Routine follow up   Immunizations    COVID booster    HPI:    Past Medical History:  Diagnosis Date   Anxiety    Dementia ([place])    GERD (gastroesophageal reflux disease)    Goiter    Hypertension    Major depression, recurrent, chronic ([place]) 02/22/2019   Osteoarthritis    Osteoporosis    Vitamin D insufficiency     Past Surgical History:  Procedure Laterality Date   APPENDECTOMY     KIDNEY STONE SURGERY     ORIF HIP FRACTURE Right 11/20/2012   Procedure: OPEN REDUCTION INTERNAL FIXATION RIGHT HIP;  Surgeon: Sanjuana Kava, MD;  Location: [facility];  Service: Orthopedics;  Laterality: Right;   stress fractures of legs     TUBAL LIGATION      Social History   Socioeconomic History   Marital status: Widowed    Spouse name: Not on file   Number of children: Not on file   Years of education: Not on file   Highest education level: Not on file  Occupational History   Occupation: retired  Tobacco Use   Smoking status: Never   Smokeless tobacco: Never  Vaping Use   Vaping Use: Never used  Substance and Sexual Activity   Alcohol use: No   Drug use: No   Sexual activity: Not Currently  Other Topics Concern   Not on file  Social History Narrative   Long term resident of [facility]    Social Determinants of Health   Financial Resource Strain: Not on file  Food Insecurity: Not on file  Transportation Needs: Not on file  Physical Activity: Not on file  Stress: Not on file  Social Connections: Not on file  Intimate Partner Violence: Not on file   Family History  Problem Relation Age of Onset   Heart attack Father    Heart disease Neg Hx       VITAL SIGNS There were  no vitals taken for this visit.  Outpatient Encounter Medications as of 11/07/2021  Medication Sig   acetaminophen (TYLENOL) 325 MG tablet Take 650 mg by mouth in the morning and at bedtime. Do not exceed 3 gm within 24 hours from all sources.   calcium carbonate (TUMS EX) 750 MG chewable tablet Chew 1 tablet by mouth 3 (three) times daily.   Cholecalciferol 50 MCG (2000 UT) CAPS Take 1 capsule by mouth daily in the afternoon. Special Instructions: for supplement for osteoporosis   losartan (COZAAR) 50 MG tablet Take 50 mg by mouth daily.   mirtazapine (REMERON) 7.5 MG tablet Take 7.5 mg by mouth at bedtime.   NON FORMULARY Diet:Dysphagia 3 (mechanical soft), thin liquids. OK to have soft sandwiches per LPN request.   omeprazole (PRILOSEC OTC) 20 MG tablet Take 20 mg by mouth daily.   OXYGEN Inhale 2 L into the lungs continuous.   No facility-administered encounter medications on file as of 11/07/2021.     SIGNIFICANT DIAGNOSTIC EXAMS       ASSESSMENT/ PLAN:     Ok Edwards NP [facility]  Contact [phone] Monday through Friday 8am- 5pm  After hours call [phone]

## 2021-11-27 ENCOUNTER — Encounter: Payer: Self-pay | Admitting: Internal Medicine

## 2021-11-27 ENCOUNTER — Non-Acute Institutional Stay (SKILLED_NURSING_FACILITY): Payer: Medicare Other | Admitting: Internal Medicine

## 2021-11-27 DIAGNOSIS — F01C0 Vascular dementia, severe, without behavioral disturbance, psychotic disturbance, mood disturbance, and anxiety: Secondary | ICD-10-CM

## 2021-11-27 DIAGNOSIS — E049 Nontoxic goiter, unspecified: Secondary | ICD-10-CM | POA: Diagnosis not present

## 2021-11-27 DIAGNOSIS — I10 Essential (primary) hypertension: Secondary | ICD-10-CM | POA: Diagnosis not present

## 2021-11-27 DIAGNOSIS — D649 Anemia, unspecified: Secondary | ICD-10-CM | POA: Diagnosis not present

## 2021-11-27 DIAGNOSIS — E43 Unspecified severe protein-calorie malnutrition: Secondary | ICD-10-CM

## 2021-11-27 NOTE — Assessment & Plan Note (Signed)
Staff reports that she constantly confabulates.  Today she can provide no history and could not even give me her name. No behavioral issues reported.

## 2021-11-27 NOTE — Assessment & Plan Note (Signed)
Today's blood pressure is an outlier; serial blood pressures reveal systolic blood pressures which range from a low of 102 up to high of 127.  No change indicated.

## 2021-11-27 NOTE — Assessment & Plan Note (Signed)
No bleeding dyscrasias reported by staff 

## 2021-11-27 NOTE — Progress Notes (Unsigned)
   NURSING HOME LOCATION:  [facility] ROOM NUMBER:  18 W  CODE STATUS:  DNR  PCP:  Ok Edwards NP  This is a nursing facility follow up visit of chronic medical diagnoses & to document compliance with Regulation 483.30 (c) in The [place] Manual Phase 2 which mandates caregiver visit ( visits can alternate among physician, PA or NP as per statutes) within 10 days of 30 days / 60 days/ 90 days post admission to SNF date    Interim medical record and care since last SNF visit was updated with review of diagnostic studies and change in clinical status since last visit were documented.  HPI: She is a permanent resident of this facility with medical diagnoses of GERD, history of goiter, essential hypertension, recurrent major depression, osteoporosis, nephrolithiasis, DJD, vitamin D deficiency, and dementia. In July of this year she had UTI with 100,000 colonies of both Morganelli Morgagni and Proteus mirabilis. Normochromic, normocytic anemia was present with H/H of 11.3/35.4.  Pain/caloric malnutrition was documented on that date with an albumin of 3 and total protein of 6.1. No prior values and Epic for comparison.  Review of systems: Dementia invalidated responses.  When asked how she were doing, her response was she wants to win a long time."  I then asked her her name and her response was "my name is been my name for a long time."  At that point she giggled.  Physical exam:  Pertinent or positive findings: As denoted severe dementia is present.  She had difficulty following simple commands.  Hair is disheveled.  Eyebrows are decreased laterally.  Dental hygiene is poor with multiple missing maxillary teeth with erosions to them below the gumline.  Asymmetric goiter is present in the midline of the anterior neck.  Heart sounds are distant.  Breath sounds are decreased.  Pedal pulses are decreased to palpation as well.  She has arthritic changes of the hands  with lateral deviation of the fingers.  General appearance: Adequately nourished; no acute distress, increased work of breathing is present.   Lymphatic: No lymphadenopathy about the head, neck, axilla. Eyes: No conjunctival inflammation or lid edema is present. There is no scleral icterus. Ears:  External ear exam shows no significant lesions or deformities.   Nose:  External nasal examination shows no deformity or inflammation. Nasal mucosa are pink and moist without lesions, exudates Oral exam:  Lips and gums are healthy appearing. There is no oropharyngeal erythema or exudate. Neck:  No thyromegaly, masses, tenderness noted.    Heart:  Normal rate and regular rhythm. S1 and S2 normal without gallop, murmur, click, rub .  Lungs: Chest clear to auscultation without wheezes, rhonchi, rales, rubs. Abdomen: Bowel sounds are normal. Abdomen is soft and nontender with no organomegaly, hernias, masses. GU: Deferred  Extremities:  No cyanosis, clubbing, edema  Neurologic exam : Cn 2-7 intact Strength equal  in upper & lower extremities Balance, Rhomberg, finger to nose testing could not be completed due to clinical state Deep tendon reflexes are equal Skin: Warm & dry w/o tenting. No significant lesions or rash.  See summary under each active problem in the Problem List with associated updated therapeutic plan

## 2021-11-27 NOTE — Patient Instructions (Signed)
See assessment and plan under each diagnosis in the problem list and acutely for this visit 

## 2021-11-27 NOTE — Assessment & Plan Note (Signed)
Nutritionist continues to follow at the SNF.

## 2021-11-28 DIAGNOSIS — Z23 Encounter for immunization: Secondary | ICD-10-CM | POA: Diagnosis not present

## 2021-11-28 NOTE — Assessment & Plan Note (Signed)
Serially TSH stable & low normal. Update TSH with next lab draw.

## 2022-01-01 ENCOUNTER — Non-Acute Institutional Stay (SKILLED_NURSING_FACILITY): Payer: Medicare Other | Admitting: Adult Health

## 2022-01-01 ENCOUNTER — Encounter: Payer: Self-pay | Admitting: Adult Health

## 2022-01-01 DIAGNOSIS — I700 Atherosclerosis of aorta: Secondary | ICD-10-CM | POA: Diagnosis not present

## 2022-01-01 DIAGNOSIS — F339 Major depressive disorder, recurrent, unspecified: Secondary | ICD-10-CM | POA: Diagnosis not present

## 2022-01-01 DIAGNOSIS — J9611 Chronic respiratory failure with hypoxia: Secondary | ICD-10-CM

## 2022-01-01 DIAGNOSIS — E43 Unspecified severe protein-calorie malnutrition: Secondary | ICD-10-CM

## 2022-01-01 NOTE — Progress Notes (Signed)
Location:  [facility] Room Number: 114 Place of Service:  SNF (31)   CODE STATUS: dnr   Allergies  Allergen Reactions   Hydrocodone-Acetaminophen Nausea And Vomiting    Chief Complaint  Patient presents with   Medical Management of Chronic Issues                            Severe protein calorie malnutrition: Aortic atherosclerosis  Major depression chronic: Chronic respiratory failure with hypoxia    HPI:  She is a 88 year old long term resident of this facility being seen for the management of her chronic illnesses: Severe protein calorie malnutrition: Aortic atherosclerosis  Major depression chronic: Chronic respiratory failure with hypoxia. She is presently on room air. There are no reports of uncontrolled pain; no reports of anxiety or depressive thoughts.   Past Medical History:  Diagnosis Date   Anxiety    Dementia (HCC)    GERD (gastroesophageal reflux disease)    Goiter    Hypertension    Major depression, recurrent, chronic (HCC) 02/22/2019   Osteoarthritis    Osteoporosis    Vitamin D insufficiency     Past Surgical History:  Procedure Laterality Date   APPENDECTOMY     KIDNEY STONE SURGERY     ORIF HIP FRACTURE Right 11/20/2012   Procedure: OPEN REDUCTION INTERNAL FIXATION RIGHT HIP;  Surgeon: Darreld Mclean, MD;  Location: [facility];  Service: Orthopedics;  Laterality: Right;   stress fractures of legs     TUBAL LIGATION      Social History   Socioeconomic History   Marital status: Widowed    Spouse name: Not on file   Number of children: Not on file   Years of education: Not on file   Highest education level: Not on file  Occupational History   Occupation: retired  Tobacco Use   Smoking status: Never   Smokeless tobacco: Never  Vaping Use   Vaping Use: Never used  Substance and Sexual Activity   Alcohol use: No   Drug use: No   Sexual activity: Not Currently  Other Topics Concern   Not on file  Social History  Narrative   Long term resident of [facility]    Social Determinants of Health   Financial Resource Strain: Not on file  Food Insecurity: Not on file  Transportation Needs: Not on file  Physical Activity: Not on file  Stress: Not on file  Social Connections: Not on file  Intimate Partner Violence: Not on file   Family History  Problem Relation Age of Onset   Heart attack Father    Heart disease Neg Hx       VITAL SIGNS BP (!) 152/82   Pulse 68   Temp 98 F (36.7 C)   Resp 18   Ht 5' (1.524 m)   Wt 114 lb 9.6 oz (52 kg)   SpO2 93%   BMI 22.38 kg/m   Outpatient Encounter Medications as of 01/01/2022  Medication Sig   acetaminophen (TYLENOL) 325 MG tablet Take 650 mg by mouth in the morning and at bedtime. Do not exceed 3 gm within 24 hours from all sources.   calcium carbonate (TUMS EX) 750 MG chewable tablet Chew 1 tablet by mouth 3 (three) times daily.   Cholecalciferol 50 MCG (2000 UT) CAPS Take 1 capsule by mouth daily in the afternoon. Special Instructions: for supplement for osteoporosis   losartan (COZAAR)  25 MG tablet Take 25 mg by mouth daily.   mirtazapine (REMERON) 7.5 MG tablet Take 7.5 mg by mouth at bedtime.   NON FORMULARY Diet:Dysphagia 3 (mechanical soft), thin liquids. OK to have soft sandwiches per LPN request.   omeprazole (PRILOSEC OTC) 20 MG tablet Take 20 mg by mouth daily.   OXYGEN Inhale 2 L into the lungs continuous.   No facility-administered encounter medications on file as of 01/01/2022.     SIGNIFICANT DIAGNOSTIC EXAMS   PREVIOUS  09-20-21: dexa: t score: -5.527  NO NEW EXAMS.    LABS REVIEWED PREVIOUS    07-11-21: wbc 5.3; hgb 11.3; hct 35.4; mcv 94.9 plt 211; glucose 86; bun 19; creat 0.58; k+ 3.9; na++ 143; ca 8.9; gfr>60; protein 6.1; albumin 3.0  08-21-21: urine culture; morganella morganii : cipro   NO NEW LABS.   Review of Systems  Unable to perform ROS: Dementia   Physical Exam Constitutional:      General: She is not in  acute distress.    Appearance: She is well-developed. She is not diaphoretic.  Neck:     Thyroid: Thyroid mass and thyromegaly present.     Comments: 3.1cm Cardiovascular:     Rate and Rhythm: Normal rate and regular rhythm.     Pulses: Normal pulses.     Heart sounds: Normal heart sounds.  Pulmonary:     Effort: Pulmonary effort is normal. No respiratory distress.     Breath sounds: Normal breath sounds.  Abdominal:     General: Bowel sounds are normal. There is no distension.     Palpations: Abdomen is soft.     Tenderness: There is no abdominal tenderness.  Musculoskeletal:        General: Normal range of motion.     Cervical back: Neck supple.     Right lower leg: No edema.     Left lower leg: No edema.  Lymphadenopathy:     Cervical: No cervical adenopathy.  Skin:    General: Skin is warm and dry.  Neurological:     Mental Status: She is alert. Mental status is at baseline.  Psychiatric:        Mood and Affect: Mood normal.     ASSESSMENT/ PLAN:  TODAY  Severe protein calorie malnutrition: albumin 3.0 will continue supplements as directed  2. Aortic atherosclerosis (ct 02-17-19) is not on statin due to advanced age   34. Major depression chronic: will continue remeron 7.5 mg nightly has failed 2 weans takes for appetite as well.   4. Chronic respiratory failure with hypoxia: is currently on room air    PREVIOUS   5. Angina pectoris unspecified; no reports of chest pain present.   6.. GERD without esophagitis: has history of GI bleed; will continue prilosec 20 mg daily  7. Vascular dementia without behavioral disturbance: weight is 114 pounds and is stable. Is off aricept will continue remeron 7.5 mg nightly to help with her appetite.; has failed 2 weans.   8. Essential hypertension: b/p 152/82 her blood pressure readings are soft will continue cozaar  25 mg daily   9. Primary osteoarthritis multiple joints: will continue tylenol 1 gm twice daily   10. Post  menopausal osteoporosis: t score -5.527 will continue calcium and vitamin d supplements no on advanced medications due to her advanced age.   11. Goiter; tsh 0.687 free t4: 0.89  12. Normochromic anemia: hgb 11.3 will monitor    Will check cbc; cmp  Ok Edwards NP [facility]  call [phone]

## 2022-01-10 ENCOUNTER — Other Ambulatory Visit (HOSPITAL_COMMUNITY)
Admission: RE | Admit: 2022-01-10 | Discharge: 2022-01-10 | Disposition: A | Discharge status: Home / Self Care | Payer: Medicare Other | Source: Skilled Nursing Facility | Attending: Adult Health | Admitting: Adult Health

## 2022-01-10 DIAGNOSIS — E559 Vitamin D deficiency, unspecified: Secondary | ICD-10-CM | POA: Insufficient documentation

## 2022-01-10 DIAGNOSIS — I10 Essential (primary) hypertension: Secondary | ICD-10-CM | POA: Diagnosis not present

## 2022-01-10 LAB — CBC
HCT: 37.6 % (ref 36.0–46.0)
Hemoglobin: 11.9 g/dL — ABNORMAL LOW (ref 12.0–15.0)
MCH: 29.8 pg (ref 26.0–34.0)
MCHC: 31.6 g/dL (ref 30.0–36.0)
MCV: 94.2 fL (ref 80.0–100.0)
Platelets: 201 10*3/uL (ref 150–400)
RBC: 3.99 MIL/uL (ref 3.87–5.11)
RDW: 14.1 % (ref 11.5–15.5)
WBC: 7.2 10*3/uL (ref 4.0–10.5)
nRBC: 0 % (ref 0.0–0.2)

## 2022-01-10 LAB — COMPREHENSIVE METABOLIC PANEL
ALT: 14 U/L (ref 0–44)
AST: 21 U/L (ref 15–41)
Albumin: 3.1 g/dL — ABNORMAL LOW (ref 3.5–5.0)
Alkaline Phosphatase: 42 U/L (ref 38–126)
Anion gap: 7 (ref 5–15)
BUN: 16 mg/dL (ref 8–23)
CO2: 26 mmol/L (ref 22–32)
Calcium: 9 mg/dL (ref 8.9–10.3)
Chloride: 108 mmol/L (ref 98–111)
Creatinine, Ser: 0.65 mg/dL (ref 0.44–1.00)
GFR, Estimated: >60 mL/min (ref >60)
Glucose, Bld: 100 mg/dL — ABNORMAL HIGH (ref 70–99)
Potassium: 3.5 mmol/L (ref 3.5–5.1)
Sodium: 141 mmol/L (ref 135–145)
Total Bilirubin: 0.5 mg/dL (ref 0.3–1.2)
Total Protein: 6.4 g/dL — ABNORMAL LOW (ref 6.5–8.1)

## 2022-01-10 LAB — VITAMIN D 25 HYDROXY (VIT D DEFICIENCY, FRACTURES): Vit D, 25-Hydroxy: 42.24 ng/mL (ref 30–100)

## 2022-01-25 ENCOUNTER — Encounter: Payer: Self-pay | Admitting: Adult Health

## 2022-01-25 ENCOUNTER — Non-Acute Institutional Stay (SKILLED_NURSING_FACILITY): Payer: Medicare Other | Admitting: Adult Health

## 2022-01-25 DIAGNOSIS — I10 Essential (primary) hypertension: Secondary | ICD-10-CM

## 2022-01-25 DIAGNOSIS — K219 Gastro-esophageal reflux disease without esophagitis: Secondary | ICD-10-CM | POA: Diagnosis not present

## 2022-01-25 DIAGNOSIS — I209 Angina pectoris, unspecified: Secondary | ICD-10-CM | POA: Diagnosis not present

## 2022-01-25 DIAGNOSIS — F01C0 Vascular dementia, severe, without behavioral disturbance, psychotic disturbance, mood disturbance, and anxiety: Secondary | ICD-10-CM

## 2022-01-25 NOTE — Progress Notes (Unsigned)
Location:  [facility] Room Number: 58 W Place of Service:  SNF (31)   CODE STATUS: DNR  Allergies  Allergen Reactions   Hydrocodone-Acetaminophen Nausea And Vomiting    Chief Complaint  Patient presents with   Medical Management of Chronic Issues    Routine visit    HPI:    Past Medical History:  Diagnosis Date   Anxiety    Dementia (HCC)    GERD (gastroesophageal reflux disease)    Goiter    Hypertension    Major depression, recurrent, chronic (HCC) 02/22/2019   Osteoarthritis    Osteoporosis    Vitamin D insufficiency     Past Surgical History:  Procedure Laterality Date   APPENDECTOMY     KIDNEY STONE SURGERY     ORIF HIP FRACTURE Right 11/20/2012   Procedure: OPEN REDUCTION INTERNAL FIXATION RIGHT HIP;  Surgeon: Darreld Mclean, MD;  Location: [facility];  Service: Orthopedics;  Laterality: Right;   stress fractures of legs     TUBAL LIGATION      Social History   Socioeconomic History   Marital status: Widowed    Spouse name: Not on file   Number of children: Not on file   Years of education: Not on file   Highest education level: Not on file  Occupational History   Occupation: retired  Tobacco Use   Smoking status: Never   Smokeless tobacco: Never  Vaping Use   Vaping Use: Never used  Substance and Sexual Activity   Alcohol use: No   Drug use: No   Sexual activity: Not Currently  Other Topics Concern   Not on file  Social History Narrative   Long term resident of [facility]    Social Determinants of Health   Financial Resource Strain: Not on file  Food Insecurity: Not on file  Transportation Needs: Not on file  Physical Activity: Not on file  Stress: Not on file  Social Connections: Not on file  Intimate Partner Violence: Not on file   Family History  Problem Relation Age of Onset   Heart attack Father    Heart disease Neg Hx       VITAL SIGNS BP 122/62   Pulse 71   Temp 98.6 F (37 C) (Temporal)   Resp 20    Ht 5' (1.524 m)   Wt 114 lb 12.8 oz (52.1 kg)   SpO2 94%   BMI 22.42 kg/m   Outpatient Encounter Medications as of 01/25/2022  Medication Sig   acetaminophen (TYLENOL) 325 MG tablet Take 650 mg by mouth in the morning and at bedtime. Do not exceed 3 gm within 24 hours from all sources.   calcium carbonate (TUMS EX) 750 MG chewable tablet Chew 1 tablet by mouth 3 (three) times daily.   Cholecalciferol 50 MCG (2000 UT) CAPS Take 1 capsule by mouth daily in the afternoon. Special Instructions: for supplement for osteoporosis   losartan (COZAAR) 25 MG tablet Take 25 mg by mouth daily.   mirtazapine (REMERON) 7.5 MG tablet Take 7.5 mg by mouth at bedtime.   NON FORMULARY Diet:Dysphagia 3 (mechanical soft), thin liquids. OK to have soft sandwiches per LPN request.   omeprazole (PRILOSEC OTC) 20 MG tablet Take 20 mg by mouth daily.   OXYGEN Inhale 2 L into the lungs continuous.   No facility-administered encounter medications on file as of 01/25/2022.     SIGNIFICANT DIAGNOSTIC EXAMS       ASSESSMENT/ PLAN:  [name] NP [facility]  Contact [phone] Monday through Friday 8am- 5pm  After hours call [phone]

## 2022-02-06 ENCOUNTER — Non-Acute Institutional Stay (SKILLED_NURSING_FACILITY): Payer: Medicare Other | Admitting: Internal Medicine

## 2022-02-06 ENCOUNTER — Encounter: Payer: Self-pay | Admitting: Internal Medicine

## 2022-02-06 DIAGNOSIS — F01C0 Vascular dementia, severe, without behavioral disturbance, psychotic disturbance, mood disturbance, and anxiety: Secondary | ICD-10-CM

## 2022-02-06 DIAGNOSIS — D649 Anemia, unspecified: Secondary | ICD-10-CM

## 2022-02-06 DIAGNOSIS — I209 Angina pectoris, unspecified: Secondary | ICD-10-CM | POA: Diagnosis not present

## 2022-02-06 DIAGNOSIS — E43 Unspecified severe protein-calorie malnutrition: Secondary | ICD-10-CM | POA: Diagnosis not present

## 2022-02-06 DIAGNOSIS — J9611 Chronic respiratory failure with hypoxia: Secondary | ICD-10-CM

## 2022-02-06 NOTE — Progress Notes (Signed)
   NURSING HOME LOCATION:  [facility] ROOM NUMBER:  26 W  CODE STATUS:  DNR  PCP:  Ok Edwards NP  This is a nursing facility follow up visit of chronic medical diagnoses & to document compliance with Regulation 483.30 (c) in The [place] Manual Phase 2 which mandates caregiver visit ( visits can alternate among physician, PA or NP as per statutes) within 10 days of 30 days / 60 days/ 90 days post admission to SNF date    Interim medical record and care since last SNF visit was updated with review of diagnostic studies and change in clinical status since last visit were documented.  HPI: She is a permanent resident of this facility with medical diagnoses of aortic atherosclerosis, history of angina, GERD, history of goiter, essential hypertension, history of recurrent depre  Labs were completed 01/10/2022 and revealed an albumin of 3.1 and total protein of 6.4.  These values have improved from 3 and 6.1 respectively.  Anemia has also improved with current H/H of 11.9/37.6 compared to prior values of 11.3/36.4.  Review of systems: Dementia invalidated responses.  She appeared to deny any active symptoms, but her speech pattern was essentially indiscernible.Marland Kitchen  Physical exam:  Pertinent or positive findings: It was 2:20 PM and she was still in bed.  Facies are blank.  She is wearing nasal oxygen.  Eyebrows are decreased laterally.  She exhibited babbling, nonsensical speech pattern with a hyponasal quality.  This was essentially indiscernible.  Heart sounds are markedly distant and breath sounds are decreased.  The heart sounds are heard only in the epigastrium.  Dorsalis pedis pulses are decreased but still stronger than the posterior tibial pulses.  She has PIP and DIP arthritic changes of the hands.  Interosseous wasting the hands is present.  General appearance: no acute distress, increased work of breathing is present.   Lymphatic: No lymphadenopathy about  the head, neck, axilla. Eyes: No conjunctival inflammation or lid edema is present. There is no scleral icterus. Ears:  External ear exam shows no significant lesions or deformities.   Nose:  External nasal examination shows no deformity or inflammation. Nasal mucosa are pink and moist without lesions, exudates Oral exam:  Lips and gums are healthy appearing. There is no oropharyngeal erythema or exudate. Neck:  No thyromegaly, masses, tenderness noted.    Heart:  No gallop, murmur, click, rub .  Lungs:  without wheezes, rhonchi, rales, rubs. Abdomen: Bowel sounds are normal. Abdomen is soft and nontender with no organomegaly, hernias, masses. GU: Deferred  Extremities:  No cyanosis, clubbing, edema  Neurologic exam :Balance, Rhomberg, finger to nose testing could not be completed due to clinical state Skin: Warm & dry w/o tenting. No significant lesions or rash.  See summary under each active problem in the Problem List with associated updated therapeutic plan

## 2022-02-06 NOTE — Patient Instructions (Signed)
See assessment and plan under each diagnosis in the problem list and acutely for this visit 

## 2022-02-07 NOTE — Assessment & Plan Note (Addendum)
She denied any anginal equivalent but dementia invalidates her responses.

## 2022-02-07 NOTE — Assessment & Plan Note (Signed)
Total protein is increased from 6.1 up to 6.4; there has been minimal improvement in albumin from 3.0-3.1.  Nutritionist continues to follow at [facility].

## 2022-02-07 NOTE — Assessment & Plan Note (Addendum)
She can provide no meaningful history and speech pattern exhibits babbling & is almost indiscernible as to content.No behavioral issues reported by staff.

## 2022-02-07 NOTE — Assessment & Plan Note (Signed)
H/H has improved from 11.3/36.4 up to 11.9/37.6.  No bleeding dyscrasias reported by staff.

## 2022-02-07 NOTE — Assessment & Plan Note (Signed)
O2 sats are 99% on nasal oxygen.  Exam reveals the stigmata of advanced COPD with markedly decreased breath sounds and distant heart sounds.

## 2022-02-11 DIAGNOSIS — Z1152 Encounter for screening for COVID-19: Secondary | ICD-10-CM | POA: Diagnosis not present

## 2022-02-11 DIAGNOSIS — Z20828 Contact with and (suspected) exposure to other viral communicable diseases: Secondary | ICD-10-CM | POA: Diagnosis not present

## 2022-02-11 DIAGNOSIS — J9611 Chronic respiratory failure with hypoxia: Secondary | ICD-10-CM | POA: Diagnosis not present

## 2022-03-12 ENCOUNTER — Non-Acute Institutional Stay (SKILLED_NURSING_FACILITY): Payer: Medicare Other | Admitting: Adult Health

## 2022-03-12 ENCOUNTER — Encounter: Payer: Self-pay | Admitting: Adult Health

## 2022-03-12 DIAGNOSIS — E049 Nontoxic goiter, unspecified: Secondary | ICD-10-CM

## 2022-03-12 DIAGNOSIS — M159 Polyosteoarthritis, unspecified: Secondary | ICD-10-CM

## 2022-03-12 DIAGNOSIS — M810 Age-related osteoporosis without current pathological fracture: Secondary | ICD-10-CM | POA: Diagnosis not present

## 2022-03-12 DIAGNOSIS — D649 Anemia, unspecified: Secondary | ICD-10-CM

## 2022-03-12 DIAGNOSIS — M150 Primary generalized (osteo)arthritis: Secondary | ICD-10-CM

## 2022-03-12 NOTE — Progress Notes (Unsigned)
Location:  [place] Room Number: NO/114/W Place of Service:  SNF (31) Emori Mumme S.,NP  CODE STATUS: DNR  Allergies  Allergen Reactions   Hydrocodone-Acetaminophen Nausea And Vomiting    Chief Complaint  Patient presents with   Medical Management of Chronic Issues    Patient is here for a follow up for chronic conditions     HPI:    Past Medical History:  Diagnosis Date   Anxiety    Dementia ([place])    GERD (gastroesophageal reflux disease)    Goiter    Hypertension    Major depression, recurrent, chronic ([place]) 02/22/2019   Osteoarthritis    Osteoporosis    Vitamin D insufficiency     Past Surgical History:  Procedure Laterality Date   APPENDECTOMY     KIDNEY STONE SURGERY     ORIF HIP FRACTURE Right 11/20/2012   Procedure: OPEN REDUCTION INTERNAL FIXATION RIGHT HIP;  Surgeon: Sanjuana Kava, MD;  Location: [facility];  Service: Orthopedics;  Laterality: Right;   stress fractures of legs     TUBAL LIGATION      Social History   Socioeconomic History   Marital status: Widowed    Spouse name: Not on file   Number of children: Not on file   Years of education: Not on file   Highest education level: Not on file  Occupational History   Occupation: retired  Tobacco Use   Smoking status: Never   Smokeless tobacco: Never  Vaping Use   Vaping Use: Never used  Substance and Sexual Activity   Alcohol use: No   Drug use: No   Sexual activity: Not Currently  Other Topics Concern   Not on file  Social History Narrative   Long term resident of [facility]    Social Determinants of Health   Financial Resource Strain: Not on file  Food Insecurity: Not on file  Transportation Needs: Not on file  Physical Activity: Not on file  Stress: Not on file  Social Connections: Not on file  Intimate Partner Violence: Not on file   Family History  Problem Relation Age of Onset   Heart attack Father    Heart disease Neg Hx       VITAL SIGNS BP  130/71   Pulse 73   Temp 97.8 F (36.6 C)   Resp 18   Ht 5' (1.524 m)   Wt 99 lb (44.9 kg)   SpO2 97%   BMI 19.33 kg/m   Outpatient Encounter Medications as of 03/12/2022  Medication Sig   acetaminophen (TYLENOL) 325 MG tablet Take 650 mg by mouth in the morning and at bedtime. Do not exceed 3 gm within 24 hours from all sources.   calcium carbonate (TUMS EX) 750 MG chewable tablet Chew 1 tablet by mouth 3 (three) times daily.   Cholecalciferol 50 MCG (2000 UT) CAPS Take 1 capsule by mouth daily in the afternoon. Special Instructions: for supplement for osteoporosis   losartan (COZAAR) 25 MG tablet Take 25 mg by mouth daily.   mirtazapine (REMERON) 7.5 MG tablet Take 7.5 mg by mouth at bedtime.   NON FORMULARY Diet:Dysphagia 3 (mechanical soft), thin liquids. OK to have soft sandwiches per LPN request.   omeprazole (PRILOSEC OTC) 20 MG tablet Take 20 mg by mouth daily.   OXYGEN Inhale 2 L into the lungs continuous.   No facility-administered encounter medications on file as of 03/12/2022.     SIGNIFICANT DIAGNOSTIC EXAMS  ASSESSMENT/ PLAN:     Ok Edwards NP [facility]  Contact [phone] Monday through Friday 8am- 5pm  After hours call [phone]

## 2022-04-08 DIAGNOSIS — B351 Tinea unguium: Secondary | ICD-10-CM | POA: Diagnosis not present

## 2022-04-08 DIAGNOSIS — M79675 Pain in left toe(s): Secondary | ICD-10-CM | POA: Diagnosis not present

## 2022-04-08 DIAGNOSIS — I739 Peripheral vascular disease, unspecified: Secondary | ICD-10-CM | POA: Diagnosis not present

## 2022-04-08 DIAGNOSIS — M79674 Pain in right toe(s): Secondary | ICD-10-CM | POA: Diagnosis not present

## 2022-04-11 ENCOUNTER — Encounter: Payer: Self-pay | Admitting: Adult Health

## 2022-04-11 ENCOUNTER — Non-Acute Institutional Stay (SKILLED_NURSING_FACILITY): Payer: Medicare Other | Admitting: Adult Health

## 2022-04-11 DIAGNOSIS — F339 Major depressive disorder, recurrent, unspecified: Secondary | ICD-10-CM | POA: Diagnosis not present

## 2022-04-11 DIAGNOSIS — E43 Unspecified severe protein-calorie malnutrition: Secondary | ICD-10-CM | POA: Diagnosis not present

## 2022-04-11 DIAGNOSIS — I700 Atherosclerosis of aorta: Secondary | ICD-10-CM

## 2022-04-11 NOTE — Progress Notes (Signed)
Location:  [place] Room Number: 114 Place of Service:  SNF (31)   CODE STATUS: dnr   Allergies  Allergen Reactions   Hydrocodone-Acetaminophen Nausea And Vomiting    Chief Complaint  Patient presents with   Medical Management of Chronic Issues              Severe protein calorie malnutrition: Aortic atherosclerosis Major depression chronic    HPI:  She is a 89 year old long term resident of this facility being seen for the management of her chronic illnesses: Severe protein calorie malnutrition: Aortic atherosclerosis Major depression chronic. There are no reports of uncontrolled pain; no anxiety; no cough or shortness of breath   Past Medical History:  Diagnosis Date   Anxiety    Dementia ([place])    GERD (gastroesophageal reflux disease)    Goiter    Hypertension    Major depression, recurrent, chronic ([place]) 02/22/2019   Osteoarthritis    Osteoporosis    Vitamin D insufficiency     Past Surgical History:  Procedure Laterality Date   APPENDECTOMY     KIDNEY STONE SURGERY     ORIF HIP FRACTURE Right 11/20/2012   Procedure: OPEN REDUCTION INTERNAL FIXATION RIGHT HIP;  Surgeon: Sanjuana Kava, MD;  Location: [facility];  Service: Orthopedics;  Laterality: Right;   stress fractures of legs     TUBAL LIGATION      Social History   Socioeconomic History   Marital status: Widowed    Spouse name: Not on file   Number of children: Not on file   Years of education: Not on file   Highest education level: Not on file  Occupational History   Occupation: retired  Tobacco Use   Smoking status: Never   Smokeless tobacco: Never  Vaping Use   Vaping Use: Never used  Substance and Sexual Activity   Alcohol use: No   Drug use: No   Sexual activity: Not Currently  Other Topics Concern   Not on file  Social History Narrative   Long term resident of [facility]    Social Determinants of Health   Financial Resource Strain: Not on file  Food Insecurity:  Not on file  Transportation Needs: Not on file  Physical Activity: Not on file  Stress: Not on file  Social Connections: Not on file  Intimate Partner Violence: Not on file   Family History  Problem Relation Age of Onset   Heart attack Father    Heart disease Neg Hx       VITAL SIGNS BP 134/71   Pulse 79   Temp 98.6 F (37 C)   Resp 20   Ht 5' (1.524 m)   Wt 118 lb 9.6 oz (53.8 kg)   SpO2 96%   BMI 23.16 kg/m   Outpatient Encounter Medications as of 04/11/2022  Medication Sig   acetaminophen (TYLENOL) 325 MG tablet Take 650 mg by mouth in the morning and at bedtime. Do not exceed 3 gm within 24 hours from all sources.   calcium carbonate (TUMS EX) 750 MG chewable tablet Chew 1 tablet by mouth 3 (three) times daily.   Cholecalciferol 50 MCG (2000 UT) CAPS Take 1 capsule by mouth daily in the afternoon. Special Instructions: for supplement for osteoporosis   losartan (COZAAR) 25 MG tablet Take 25 mg by mouth daily.   mirtazapine (REMERON) 7.5 MG tablet Take 7.5 mg by mouth at bedtime.   NON FORMULARY Diet:Dysphagia 3 (mechanical soft), thin  liquids. OK to have soft sandwiches per LPN request.   omeprazole (PRILOSEC OTC) 20 MG tablet Take 20 mg by mouth daily.   OXYGEN Inhale 2 L into the lungs continuous.   No facility-administered encounter medications on file as of 04/11/2022.     SIGNIFICANT DIAGNOSTIC EXAMS   PREVIOUS  09-20-21: dexa: t score: -5.527  NO NEW EXAMS.    LABS REVIEWED PREVIOUS    07-11-21: wbc 5.3; hgb 11.3; hct 35.4; mcv 94.9 plt 211; glucose 86; bun 19; creat 0.58; k+ 3.9; na++ 143; ca 8.9; gfr>60; protein 6.1; albumin 3.0  08-21-21: urine culture; morganella morganii : cipro  01-10-22: wbc 7.2; hgb 11.9; hct 37.6; mcv 994.2 plt 201; glucose 100; bun 16; creat 0.65; k+ 3.5; na++ 141; ca 9.0; protein 6.4; albumin 3.1 gfr >60;  vitamin D 42.24  NO NEW LABS   Review of Systems  Reason unable to perform ROS: dementia.   Physical Exam Constitutional:       General: She is not in acute distress.    Appearance: She is well-developed. She is not diaphoretic.  Neck:     Thyroid: Thyroid mass and thyromegaly present.  Cardiovascular:     Rate and Rhythm: Normal rate and regular rhythm.     Pulses: Normal pulses.     Heart sounds: Normal heart sounds.  Pulmonary:     Effort: Pulmonary effort is normal. No respiratory distress.     Breath sounds: Normal breath sounds.  Abdominal:     General: Bowel sounds are normal. There is no distension.     Palpations: Abdomen is soft.     Tenderness: There is no abdominal tenderness.  Musculoskeletal:        General: Normal range of motion.     Cervical back: Neck supple.     Right lower leg: No edema.     Left lower leg: No edema.  Lymphadenopathy:     Cervical: No cervical adenopathy.  Skin:    General: Skin is warm and dry.  Neurological:     Mental Status: She is alert. Mental status is at baseline.  Psychiatric:        Mood and Affect: Mood normal.      ASSESSMENT/ PLAN:  TODAY  Severe protein calorie malnutrition: albumin 3.1 will continue supplements as directed  2. Aortic atherosclerosis (ct 02-17-19) no statin due to advanced age  74. Major depression chronic: will continue remeron 7.5 mg nightly has failed 2 weans; does help with appetite as well.    PREVIOUS   4. Chronic respiratory failure with hypoxia: is currently on room air   5. Angina pectoris unspecified: no reports of chest pain present   6. GERD without esophagitis: has history of GI bleed; will continue prilosec 20 mg daily  7. Vascular dementia without behavioral disturbance: weight is 99 pounds; is stable is off aricept; will continue remeron 7.5 mg nightly to maintain her appetite  8. Essential hypertension: b/p 13/71 will continue cozaar 25 mg nightly   9. Primary osteoarthritis multiple joints: will continue tylenol 1 gm twice daily   10. Post menopausal osteoporosis: t score -5.527 is on supplements;  is not on other medications due to her advanced age  37. Goiter: tsh 0.687 free t4: 0.89  12. Normochromic anemia hgb 11.9 will monitor     Ok Edwards NP [facility]  call [phone]

## 2022-05-08 ENCOUNTER — Non-Acute Institutional Stay (SKILLED_NURSING_FACILITY): Payer: Medicare Other | Admitting: Internal Medicine

## 2022-05-08 ENCOUNTER — Encounter: Payer: Self-pay | Admitting: Internal Medicine

## 2022-05-08 DIAGNOSIS — E049 Nontoxic goiter, unspecified: Secondary | ICD-10-CM

## 2022-05-08 DIAGNOSIS — E43 Unspecified severe protein-calorie malnutrition: Secondary | ICD-10-CM | POA: Diagnosis not present

## 2022-05-08 DIAGNOSIS — I10 Essential (primary) hypertension: Secondary | ICD-10-CM | POA: Diagnosis not present

## 2022-05-08 DIAGNOSIS — D649 Anemia, unspecified: Secondary | ICD-10-CM

## 2022-05-08 DIAGNOSIS — J9611 Chronic respiratory failure with hypoxia: Secondary | ICD-10-CM | POA: Diagnosis not present

## 2022-05-08 DIAGNOSIS — R259 Unspecified abnormal involuntary movements: Secondary | ICD-10-CM | POA: Diagnosis not present

## 2022-05-08 NOTE — Assessment & Plan Note (Signed)
BP controlled; no change in antihypertensive medications  

## 2022-05-08 NOTE — Assessment & Plan Note (Addendum)
Serially there is been slight improvement in albumin and total protein but clinically she had appears to have significant protein/caloric malnutrition manifested as interosseous wasting documented. Strength could not be tested as she would not follow commands.

## 2022-05-08 NOTE — Assessment & Plan Note (Signed)
O2 sats adequate on supplemental oxygen.  No abnormal breath sounds auscultated on exam.

## 2022-05-08 NOTE — Assessment & Plan Note (Signed)
TSH will be updated.

## 2022-05-08 NOTE — Assessment & Plan Note (Signed)
Serially the mild anemia has improved.  No bleeding dyscrasias reported by staff.

## 2022-05-08 NOTE — Progress Notes (Signed)
   NURSING HOME LOCATION:  [facility] ROOM NUMBER:  114 W  CODE STATUS:  DNR  PCP:  [name] NP  This is a nursing facility follow up visit of chronic medical diagnoses & to document compliance with Regulation 483.30 (c) in [employer] Manual Phase 2 which mandates caregiver visit ( visits can alternate among physician, PA or NP as per statutes) within 10 days of 30 days / 60 days/ 90 days post admission to SNF date    Interim medical record and care since last SNF visit was updated with review of diagnostic studies and change in clinical status since last visit were documented.  HPI: She is a permanent resident of this facility with medical diagnoses of essential hypertension, degenerative joint disease, osteoporosis, vitamin D deficiency, GERD, goiter, and vascular dementia.  Most recent labs performed 01/10/2022 revealed protein/caloric malnutrition with an albumin of 3.1 and total protein 6.4.  Prior values had been 3.0 and 6.1 indicating slight improvement.  Glucose was minimally elevated at 100.  Minimal normochromic, normocytic anemia was present with H/H of 11.9/37.6; this was improved from prior values of 11.3/35.4. TSH is not current but in the past values have ranged from a low of 0.317 up to 0.687; this is in the context of goiter.  Review of systems: Dementia prevented completion of review of systems.Marland Kitchen  Responses were essentially indiscernible and speech pattern was garbled.  Initially she told me she had a "bad spell"; when I asked what this meant her response was "when I want what I want and cannot get it." Staff reported that she had been "shaking' resembling trembling rather than rigor or seizure activity today.  Physical exam:  Pertinent or positive findings: She appears her age and somewhat suboptimally nourished and chronically ill.  Initially she was asleep wearing nasal oxygen.  She exhibited no apnea or snoring.  She was moving her  lips without vocalization.  She did arouse easily.  Upon arousing facies were blank.  Eyebrows are decreased laterally.  She is missing the upper anterior maxillary teeth.  Large goiter is present.  Slight gallop cadence is noted.  Breath sounds are decreased.  Pedal pulses are decreased.  When the feet were examined in the left toe was upgoing.  She has interosseous wasting of the hands.  There are mild DIP osteoarthritic changes of the hands.  Strength could not be tested as she could not follow commands.  General appearance: no acute distress, increased work of breathing is present.   Lymphatic: No lymphadenopathy about the head, neck, axilla. Eyes: No conjunctival inflammation or lid edema is present. There is no scleral icterus. Ears:  External ear exam shows no significant lesions or deformities.   Nose:  External nasal examination shows no deformity or inflammation. Nasal mucosa are pink and moist without lesions, exudates Neck:  No tenderness noted.    Heart:  No murmur, click, rub .  Lungs: without wheezes, rhonchi, rales, rubs. Abdomen: Bowel sounds are normal. Abdomen is soft and nontender with no organomegaly, hernias, masses. GU: Deferred  Extremities:  No cyanosis, clubbing, edema  Neurologic exam :Balance, Rhomberg, finger to nose testing could not be completed due to clinical state Skin: Warm & dry w/o tenting. No significant lesions or rash.  See summary under each active problem in the Problem List with associated updated therapeutic plan

## 2022-05-09 ENCOUNTER — Other Ambulatory Visit (HOSPITAL_COMMUNITY)
Admission: RE | Admit: 2022-05-09 | Discharge: 2022-05-09 | Disposition: A | Discharge status: Home / Self Care | Payer: Medicare Other | Source: Skilled Nursing Facility | Attending: Internal Medicine | Admitting: Internal Medicine

## 2022-05-09 ENCOUNTER — Encounter: Payer: Self-pay | Admitting: Adult Health

## 2022-05-09 ENCOUNTER — Non-Acute Institutional Stay (SKILLED_NURSING_FACILITY): Payer: Medicare Other | Admitting: Adult Health

## 2022-05-09 DIAGNOSIS — R259 Unspecified abnormal involuntary movements: Secondary | ICD-10-CM | POA: Diagnosis not present

## 2022-05-09 DIAGNOSIS — E0590 Thyrotoxicosis, unspecified without thyrotoxic crisis or storm: Secondary | ICD-10-CM | POA: Diagnosis not present

## 2022-05-09 DIAGNOSIS — E049 Nontoxic goiter, unspecified: Secondary | ICD-10-CM

## 2022-05-09 DIAGNOSIS — D649 Anemia, unspecified: Secondary | ICD-10-CM | POA: Insufficient documentation

## 2022-05-09 DIAGNOSIS — E46 Unspecified protein-calorie malnutrition: Secondary | ICD-10-CM | POA: Insufficient documentation

## 2022-05-09 LAB — CBC WITH DIFFERENTIAL/PLATELET
Abs Immature Granulocytes: 0.02 10*3/uL (ref 0.00–0.07)
Basophils Absolute: 0 10*3/uL (ref 0.0–0.1)
Basophils Relative: 0 %
Eosinophils Absolute: 0 10*3/uL (ref 0.0–0.5)
Eosinophils Relative: 1 %
HCT: 38.2 % (ref 36.0–46.0)
Hemoglobin: 11.8 g/dL — ABNORMAL LOW (ref 12.0–15.0)
Immature Granulocytes: 0 %
Lymphocytes Relative: 13 %
Lymphs Abs: 1 10*3/uL (ref 0.7–4.0)
MCH: 29.5 pg (ref 26.0–34.0)
MCHC: 30.9 g/dL (ref 30.0–36.0)
MCV: 95.5 fL (ref 80.0–100.0)
Monocytes Absolute: 1.1 10*3/uL — ABNORMAL HIGH (ref 0.1–1.0)
Monocytes Relative: 14 %
Neutro Abs: 5.2 10*3/uL (ref 1.7–7.7)
Neutrophils Relative %: 72 %
Platelets: 181 10*3/uL (ref 150–400)
RBC: 4 MIL/uL (ref 3.87–5.11)
RDW: 14.5 % (ref 11.5–15.5)
WBC: 7.3 10*3/uL (ref 4.0–10.5)
nRBC: 0 % (ref 0.0–0.2)

## 2022-05-09 LAB — COMPREHENSIVE METABOLIC PANEL
ALT: 39 U/L (ref 0–44)
AST: 41 U/L (ref 15–41)
Albumin: 3.1 g/dL — ABNORMAL LOW (ref 3.5–5.0)
Alkaline Phosphatase: 49 U/L (ref 38–126)
Anion gap: 9 (ref 5–15)
BUN: 21 mg/dL (ref 8–23)
CO2: 28 mmol/L (ref 22–32)
Calcium: 9 mg/dL (ref 8.9–10.3)
Chloride: 104 mmol/L (ref 98–111)
Creatinine, Ser: 0.73 mg/dL (ref 0.44–1.00)
GFR, Estimated: >60 mL/min (ref >60)
Glucose, Bld: 120 mg/dL — ABNORMAL HIGH (ref 70–99)
Potassium: 4.3 mmol/L (ref 3.5–5.1)
Sodium: 141 mmol/L (ref 135–145)
Total Bilirubin: 0.6 mg/dL (ref 0.3–1.2)
Total Protein: 7.2 g/dL (ref 6.5–8.1)

## 2022-05-09 LAB — VITAMIN B12: Vitamin B-12: 314 pg/mL (ref 180–914)

## 2022-05-09 LAB — TSH: TSH: 0.221 u[IU]/mL — ABNORMAL LOW (ref 0.350–4.500)

## 2022-05-09 LAB — MAGNESIUM: Magnesium: 2.5 mg/dL — ABNORMAL HIGH (ref 1.7–2.4)

## 2022-05-09 NOTE — Progress Notes (Signed)
Location:  [facility] Room Number: [phone] Place of Service:  SNF (31)   CODE STATUS: DNR  Allergies  Allergen Reactions   Hydrocodone-Acetaminophen Nausea And Vomiting    Chief Complaint  Patient presents with   Acute Visit    Follow up Labwork    HPI:  She has had labs drawn demonstrating hyperthyroidism. She does have a large goiter. Her family had decided not to put her through a work up several years ago. Her weight is without significant change.   Past Medical History:  Diagnosis Date   Anxiety    Dementia    GERD (gastroesophageal reflux disease)    Goiter    Hypertension    Major depression, recurrent, chronic 02/22/2019   Osteoarthritis    Osteoporosis    Vitamin D insufficiency     Past Surgical History:  Procedure Laterality Date   APPENDECTOMY     KIDNEY STONE SURGERY     ORIF HIP FRACTURE Right 11/20/2012   Procedure: OPEN REDUCTION INTERNAL FIXATION RIGHT HIP;  Surgeon: Darreld McleanWayne Keeling, MD;  Location: [facility];  Service: Orthopedics;  Laterality: Right;   stress fractures of legs     TUBAL LIGATION      Social History   Socioeconomic History   Marital status: Widowed    Spouse name: Not on file   Number of children: Not on file   Years of education: Not on file   Highest education level: Not on file  Occupational History   Occupation: retired  Tobacco Use   Smoking status: Never   Smokeless tobacco: Never  Vaping Use   Vaping Use: Never used  Substance and Sexual Activity   Alcohol use: No   Drug use: No   Sexual activity: Not Currently  Other Topics Concern   Not on file  Social History Narrative   [facility]    Social Determinants of Health   Financial Resource Strain: Not on file  Food Insecurity: Not on file  Transportation Needs: Not on file  Physical Activity: Not on file  Stress: Not on file  Social Connections: Not on file  Intimate Partner Violence: Not on file   Family History   Problem Relation Age of Onset   Heart attack Father    Heart disease Neg Hx       VITAL SIGNS BP (!) 150/90   Pulse 75   Temp (!) 97.3 F (36.3 C)   Resp 20   Ht 5' (1.524 m)   Wt 116 lb 9.6 oz (52.9 kg)   SpO2 94%   BMI 22.77 kg/m   Outpatient Encounter Medications as of 05/09/2022  Medication Sig   acetaminophen (TYLENOL) 325 MG tablet Take 650 mg by mouth in the morning and at bedtime. Do not exceed 3 gm within 24 hours from all sources.   calcium carbonate (TUMS EX) 750 MG chewable tablet Chew 1 tablet by mouth 3 (three) times daily.   losartan (COZAAR) 25 MG tablet Take 25 mg by mouth daily.   mirtazapine (REMERON) 7.5 MG tablet Take 7.5 mg by mouth at bedtime.   NON FORMULARY Diet:Dysphagia 3 (mechanical soft), thin liquids. OK to have soft sandwiches per LPN request.   omeprazole (PRILOSEC OTC) 20 MG tablet Take 20 mg by mouth daily.   OXYGEN Inhale 2 L into the lungs continuous.   [DISCONTINUED] Cholecalciferol 50 MCG (2000 UT) CAPS Take 1 capsule by mouth daily in the afternoon. Special Instructions: for supplement  for osteoporosis   No facility-administered encounter medications on file as of 05/09/2022.     SIGNIFICANT DIAGNOSTIC EXAMS  PREVIOUS  09-20-21: dexa: t score: -5.527  NO NEW EXAMS.    LABS REVIEWED PREVIOUS    07-11-21: wbc 5.3; hgb 11.3; hct 35.4; mcv 94.9 plt 211; glucose 86; bun 19; creat 0.58; k+ 3.9; na++ 143; ca 8.9; gfr>60; protein 6.1; albumin 3.0  08-21-21: urine culture; morganella morganii : cipro  01-10-22: wbc 7.2; hgb 11.9; hct 37.6; mcv 994.2 plt 201; glucose 100; bun 16; creat 0.65; k+ 3.5; na++ 141; ca 9.0; protein 6.4; albumin 3.1 gfr >60;  vitamin D 42.24  TODAY  05-09-22: wbc 7.3; hgb 11.8; hct 38.2; mcv 95.5 plt 181; glucose 120; bun 21; creat 0.73; k+ 4.3; na++ 141; ca 9.0; gfr >60; protein 7.2 albumin 3.1; tsh 0.221 vitamin B 12: 314 mag 2.5    Review of Systems  Unable to perform ROS: Dementia    Physical  Exam Constitutional:      General: She is not in acute distress.    Appearance: She is well-developed. She is not diaphoretic.  Neck:     Thyroid: Thyroid mass and thyromegaly present.  Cardiovascular:     Rate and Rhythm: Normal rate and regular rhythm.     Pulses: Normal pulses.     Heart sounds: Normal heart sounds.  Pulmonary:     Effort: Pulmonary effort is normal. No respiratory distress.     Breath sounds: Normal breath sounds.  Abdominal:     General: Bowel sounds are normal. There is no distension.     Palpations: Abdomen is soft.     Tenderness: There is no abdominal tenderness.  Musculoskeletal:        General: Normal range of motion.     Cervical back: Neck supple.     Right lower leg: No edema.     Left lower leg: No edema.  Lymphadenopathy:     Cervical: No cervical adenopathy.  Skin:    General: Skin is warm and dry.  Neurological:     Mental Status: She is alert. Mental status is at baseline.  Psychiatric:        Mood and Affect: Mood normal.       ASSESSMENT/ PLAN:  TODAY  Goiter Subclinical hyperthyroidism Will begin tapazole 5 mg daily  Will repeat thyroid labs on 07-11-22.    Synthia Innocenteborah Cloma Rahrig NP [facility]  call [phone]

## 2022-05-10 NOTE — Patient Instructions (Signed)
See assessment and plan under each diagnosis in the problem list and acutely for this visit 

## 2022-05-14 DIAGNOSIS — E0590 Thyrotoxicosis, unspecified without thyrotoxic crisis or storm: Secondary | ICD-10-CM | POA: Insufficient documentation

## 2022-05-31 DIAGNOSIS — M1611 Unilateral primary osteoarthritis, right hip: Secondary | ICD-10-CM | POA: Diagnosis not present

## 2022-05-31 DIAGNOSIS — M1711 Unilateral primary osteoarthritis, right knee: Secondary | ICD-10-CM | POA: Diagnosis not present

## 2022-06-01 ENCOUNTER — Telehealth: Payer: Self-pay | Admitting: Orthopedic Surgery

## 2022-06-01 DIAGNOSIS — G8929 Other chronic pain: Secondary | ICD-10-CM

## 2022-06-01 DIAGNOSIS — M159 Polyosteoarthritis, unspecified: Secondary | ICD-10-CM

## 2022-06-01 MED ORDER — METHOCARBAMOL 500 MG PO TABS: 250.0000 mg | ORAL_TABLET | Freq: Three times a day (TID) | ORAL | 0 refills | Status: DC | PRN

## 2022-06-01 MED ORDER — ACETAMINOPHEN 500 MG PO TABS: 1000.0000 mg | ORAL_TABLET | Freq: Three times a day (TID) | ORAL | 0 refills | Status: DC

## 2022-06-01 NOTE — Telephone Encounter (Signed)
[facility] nursing reports increased right knee pain. Recent xray right knee revealed increased arthritis. Poor historian due to dementia. Nurse reports increased pain to right knee and hip with transfers. She is being given tylenol 650 mg without relief. Orders given to increase tylenol to 1000 mg po TID x 2 weeks. Robaxin 250 mg po TID prn. Advised to complete SBAR for facility provider.

## 2022-06-03 ENCOUNTER — Encounter: Payer: Self-pay | Admitting: Adult Health

## 2022-06-03 ENCOUNTER — Encounter: Payer: Self-pay | Admitting: Nurse Practitioner

## 2022-06-03 NOTE — Progress Notes (Signed)
This encounter was created in error - please disregard.

## 2022-06-03 NOTE — Progress Notes (Unsigned)
Location:  [facility] Room Number: 104 W Place of Service:  SNF (31)   CODE STATUS: DNR  Allergies  Allergen Reactions   Hydrocodone-Acetaminophen Nausea And Vomiting    Chief Complaint  Patient presents with   Acute Visit    Right leg pain     HPI:    Past Medical History:  Diagnosis Date   Anxiety    Dementia (HCC)    GERD (gastroesophageal reflux disease)    Goiter    Hypertension    Major depression, recurrent, chronic (HCC) 02/22/2019   Osteoarthritis    Osteoporosis    Vitamin D insufficiency     Past Surgical History:  Procedure Laterality Date   APPENDECTOMY     KIDNEY STONE SURGERY     ORIF HIP FRACTURE Right 11/20/2012   Procedure: OPEN REDUCTION INTERNAL FIXATION RIGHT HIP;  Surgeon: Darreld Mclean, MD;  Location: [facility];  Service: Orthopedics;  Laterality: Right;   stress fractures of legs     TUBAL LIGATION      Social History   Socioeconomic History   Marital status: Widowed    Spouse name: Not on file   Number of children: Not on file   Years of education: Not on file   Highest education level: Not on file  Occupational History   Occupation: retired  Tobacco Use   Smoking status: Never   Smokeless tobacco: Never  Vaping Use   Vaping Use: Never used  Substance and Sexual Activity   Alcohol use: No   Drug use: No   Sexual activity: Not Currently  Other Topics Concern   Not on file  Social History Narrative   [facility] resident of [facility]    Social Determinants of Health   Financial Resource Strain: Not on file  Food Insecurity: Not on file  Transportation Needs: Not on file  Physical Activity: Not on file  Stress: Not on file  Social Connections: Not on file  Intimate Partner Violence: Not on file   Family History  Problem Relation Age of Onset   Heart attack Father    Heart disease Neg Hx       VITAL SIGNS BP 120/72   Pulse 78   Temp 98.9 F (37.2 C)   Resp 20   Ht 5' (1.524 m)   Wt 116 lb  (52.6 kg)   SpO2 96%   BMI 22.65 kg/m   Outpatient Encounter Medications as of 06/03/2022  Medication Sig   acetaminophen (TYLENOL) 500 MG tablet Take 2 tablets (1,000 mg total) by mouth in the morning, at noon, and at bedtime for 14 days.   acetaminophen (TYLENOL) 650 MG CR tablet Take 650 mg by mouth every 4 (four) hours as needed for pain.   losartan (COZAAR) 25 MG tablet Take 25 mg by mouth daily.   methimazole (TAPAZOLE) 5 MG tablet Take 5 mg by mouth daily.   methocarbamol (ROBAXIN) 500 MG tablet Take 0.5 tablets (250 mg total) by mouth every 8 (eight) hours as needed for muscle spasms.   mirtazapine (REMERON) 7.5 MG tablet Take 7.5 mg by mouth at bedtime.   NON FORMULARY Diet:Dysphagia 3 (mechanical soft), thin liquids. OK to have soft sandwiches per LPN request.   Nutritional Supplements (ENSURE ENLIVE PO) Take 1 Can by mouth daily.   omeprazole (PRILOSEC OTC) 20 MG tablet Take 20 mg by mouth daily.   OXYGEN Inhale 2 L into the lungs continuous.   No facility-administered encounter medications on  file as of 06/03/2022.     SIGNIFICANT DIAGNOSTIC EXAMS       ASSESSMENT/ PLAN:     [name] NP [facility]  Contact [phone] Monday through Friday 8am- 5pm  After hours call [phone]

## 2022-06-03 NOTE — Progress Notes (Deleted)
Location:  [facility] Room Number: 37 W Place of Service:  SNF (31)   CODE STATUS: DNR Allergies  Allergen Reactions   Hydrocodone-Acetaminophen Nausea And Vomiting    Chief Complaint  Patient presents with   Acute Visit    Right leg pain     HPI:    Past Medical History:  Diagnosis Date   Anxiety    Dementia (HCC)    GERD (gastroesophageal reflux disease)    Goiter    Hypertension    Major depression, recurrent, chronic (HCC) 02/22/2019   Osteoarthritis    Osteoporosis    Vitamin D insufficiency     Past Surgical History:  Procedure Laterality Date   APPENDECTOMY     KIDNEY STONE SURGERY     ORIF HIP FRACTURE Right 11/20/2012   Procedure: OPEN REDUCTION INTERNAL FIXATION RIGHT HIP;  Surgeon: Darreld Mclean, MD;  Location: [facility];  Service: Orthopedics;  Laterality: Right;   stress fractures of legs     TUBAL LIGATION      Social History   Socioeconomic History   Marital status: Widowed    Spouse name: Not on file   Number of children: Not on file   Years of education: Not on file   Highest education level: Not on file  Occupational History   Occupation: retired  Tobacco Use   Smoking status: Never   Smokeless tobacco: Never  Vaping Use   Vaping Use: Never used  Substance and Sexual Activity   Alcohol use: No   Drug use: No   Sexual activity: Not Currently  Other Topics Concern   Not on file  Social History Narrative   Long term resident of [facility]    Social Determinants of Health   Financial Resource Strain: Not on file  Food Insecurity: Not on file  Transportation Needs: Not on file  Physical Activity: Not on file  Stress: Not on file  Social Connections: Not on file  Intimate Partner Violence: Not on file   Family History  Problem Relation Age of Onset   Heart attack Father    Heart disease Neg Hx       VITAL SIGNS BP 120/72   Pulse 78   Temp 98.9 F (37.2 C)   Resp 20   Ht 5' (1.524 m)   Wt 116 lb 9.6  oz (52.9 kg)   SpO2 96%   BMI 22.77 kg/m   Outpatient Encounter Medications as of 06/03/2022  Medication Sig   acetaminophen (TYLENOL) 500 MG tablet Take 2 tablets (1,000 mg total) by mouth in the morning, at noon, and at bedtime for 14 days.   acetaminophen (TYLENOL) 650 MG CR tablet Take 650 mg by mouth every 4 (four) hours as needed for pain.   losartan (COZAAR) 25 MG tablet Take 25 mg by mouth daily.   methimazole (TAPAZOLE) 5 MG tablet Take 5 mg by mouth daily.   methocarbamol (ROBAXIN) 500 MG tablet Take 0.5 tablets (250 mg total) by mouth every 8 (eight) hours as needed for muscle spasms.   mirtazapine (REMERON) 7.5 MG tablet Take 7.5 mg by mouth at bedtime.   NON FORMULARY Diet:Dysphagia 3 (mechanical soft), thin liquids. OK to have soft sandwiches per LPN request.   Nutritional Supplements (ENSURE ENLIVE PO) Take 1 Can by mouth daily.   omeprazole (PRILOSEC OTC) 20 MG tablet Take 20 mg by mouth daily.   OXYGEN Inhale 2 L into the lungs continuous.   [DISCONTINUED] calcium carbonate (TUMS  EX) 750 MG chewable tablet Chew 1 tablet by mouth 3 (three) times daily.   No facility-administered encounter medications on file as of 06/03/2022.     SIGNIFICANT DIAGNOSTIC EXAMS       ASSESSMENT/ PLAN:     [name] NP [facility] Adult [facility]  Contact [phone] Monday through Friday 8am- 5pm  After hours call [phone]

## 2022-06-06 NOTE — Progress Notes (Signed)
This encounter was created in error - please disregard.

## 2022-06-07 DIAGNOSIS — M25851 Other specified joint disorders, right hip: Secondary | ICD-10-CM | POA: Diagnosis not present

## 2022-06-07 DIAGNOSIS — M25861 Other specified joint disorders, right knee: Secondary | ICD-10-CM | POA: Diagnosis not present

## 2022-06-07 DIAGNOSIS — M7989 Other specified soft tissue disorders: Secondary | ICD-10-CM | POA: Diagnosis not present

## 2022-06-07 DIAGNOSIS — M85861 Other specified disorders of bone density and structure, right lower leg: Secondary | ICD-10-CM | POA: Diagnosis not present

## 2022-06-10 ENCOUNTER — Encounter: Payer: Self-pay | Admitting: Internal Medicine

## 2022-06-10 ENCOUNTER — Other Ambulatory Visit (HOSPITAL_COMMUNITY): Payer: Self-pay | Admitting: Adult Health

## 2022-06-10 ENCOUNTER — Non-Acute Institutional Stay (SKILLED_NURSING_FACILITY): Payer: Medicare Other | Admitting: Internal Medicine

## 2022-06-10 ENCOUNTER — Ambulatory Visit (HOSPITAL_COMMUNITY)
Admission: RE | Admit: 2022-06-10 | Discharge: 2022-06-10 | Disposition: A | Discharge status: Home / Self Care | Payer: Medicare Other | Source: Ambulatory Visit | Attending: Adult Health | Admitting: Adult Health

## 2022-06-10 DIAGNOSIS — M79604 Pain in right leg: Secondary | ICD-10-CM

## 2022-06-10 DIAGNOSIS — F01C0 Vascular dementia, severe, without behavioral disturbance, psychotic disturbance, mood disturbance, and anxiety: Secondary | ICD-10-CM | POA: Diagnosis not present

## 2022-06-10 DIAGNOSIS — M79661 Pain in right lower leg: Secondary | ICD-10-CM | POA: Diagnosis not present

## 2022-06-10 DIAGNOSIS — E440 Moderate protein-calorie malnutrition: Secondary | ICD-10-CM

## 2022-06-10 DIAGNOSIS — M159 Polyosteoarthritis, unspecified: Secondary | ICD-10-CM | POA: Diagnosis not present

## 2022-06-10 DIAGNOSIS — R600 Localized edema: Secondary | ICD-10-CM | POA: Diagnosis not present

## 2022-06-10 DIAGNOSIS — M25561 Pain in right knee: Secondary | ICD-10-CM | POA: Diagnosis not present

## 2022-06-10 NOTE — Assessment & Plan Note (Signed)
Albumin is reduced and she has interosseous wasting; total protein is normal.  Nutritionist continues to follow her at the SNF.

## 2022-06-10 NOTE — Assessment & Plan Note (Signed)
She can provide no history and had difficulty following commands.  She denied any pain with special reference to the right knee.

## 2022-06-10 NOTE — Assessment & Plan Note (Signed)
[facility] imaging performed 06/01/2022 revealing no evidence of acute fracture or dislocation.  Mild joint effusion and mild osteopenia with moderate osteoarthritis.

## 2022-06-10 NOTE — Patient Instructions (Signed)
See assessment and plan under each diagnosis in the problem list and acutely for this visit 

## 2022-06-10 NOTE — Progress Notes (Signed)
   NURSING HOME LOCATION:  [facility] ROOM NUMBER:  [id number]  CODE STATUS:  DNR  PCP:  [name] NP  This is a nursing facility follow up visit for specific acute issue of R knee pain.  Interim medical record and care since last SNF visit was updated with review of diagnostic studies and change in clinical status since last visit were documented.  HPI: Apparently she has been complaining of right knee pain since April 26. Multiple imaging revealed no fractures.  Doppler revealed no DVT.  On call NP ordered Tylenol 1000 mg 3 times daily and Robaxin for 14 days pending further evaluation. She is a permanent resident of this facility with medical diagnoses of GERD, goiter, essential hypertension, history of anxiety/depression, dementia, osteoporosis, vitamin D deficiency, and osteoarthritis. Labs are current having been performed 05/09/2022.  Mild hyperglycemia was present with a glucose of 120.  There was minimal elevation of magnesium at 2.5.  Albumin was 3.1 but total protein was 7.2.  Mild normochromic, normocytic anemia was present with H/H of 11.8/38.2.  B12 level was therapeutic.  TSH was 0.221; as noted she has a history of goiter and is on methimazole. The most recent vitamin D 25-hydroxy level was 42.24 on 01/10/2022.  As noted she does have history of osteoporosis as well as vitamin D deficiency.  Calcium level was 9.0 on 05/09/2022.  Review of systems: Dementia invalidated responses.  She can provide no history.  She denied any pain stating "not that I recon" then she laughed.  Physical exam:  Pertinent or positive findings: She was initially asleep exhibiting hypopnea without snoring or frank apnea.  She was wearing nasal oxygen.  Her mouth is open.  Teeth are malaligned and exhibits some plaque.  It was difficult to assess the anterior maxillary teeth as she could not follow commands to allow this exam.  Eyebrows are decreased laterally.  Large goiter is visible over the  anterior neck.  Heart sounds are distant; rhythm is clinically regular.  Breath sounds are also decreased.  She has trace edema at the sock line.  Pedal pulses are decreased, the posterior tibial pulses more than the dorsalis pedis pulses.  She has interosseous wasting of the hands.  She has mild DIP changes in the hands.  There was decreased range of motion noted at the knees.  There is no evidence of discomfort to palpation or attempted range of motion.  No effusion was noted.  There is no change in the skin as to color or temperature.  When the feet were examined; the right toe was upgoing.  General appearance:  no acute distress, increased work of breathing is present.   Lymphatic: No lymphadenopathy about the head, neck, axilla. Eyes: No conjunctival inflammation or lid edema is present. There is no scleral icterus. Ears:  External ear exam shows no significant lesions or deformities.   Nose:  External nasal examination shows no deformity or inflammation. Nasal mucosa are pink and moist without lesions, exudates Neck:  No thyromegaly, masses, tenderness noted.    Heart:  No gallop, murmur, click, rub .  Lungs: without wheezes, rhonchi, rales, rubs. Abdomen: Bowel sounds are normal. Abdomen is soft and nontender with no organomegaly, hernias, masses. GU: Deferred  Extremities:  No cyanosis, clubbing  Skin: Warm & dry w/o tenting. No significant lesions or rash.  See summary under each active problem in the Problem List with associated updated therapeutic plan

## 2022-06-11 DIAGNOSIS — I739 Peripheral vascular disease, unspecified: Secondary | ICD-10-CM | POA: Diagnosis not present

## 2022-06-11 DIAGNOSIS — L603 Nail dystrophy: Secondary | ICD-10-CM | POA: Diagnosis not present

## 2022-06-13 ENCOUNTER — Non-Acute Institutional Stay (SKILLED_NURSING_FACILITY): Payer: Medicare Other | Admitting: Adult Health

## 2022-06-13 ENCOUNTER — Encounter: Payer: Self-pay | Admitting: Adult Health

## 2022-06-13 DIAGNOSIS — I209 Angina pectoris, unspecified: Secondary | ICD-10-CM

## 2022-06-13 DIAGNOSIS — J9611 Chronic respiratory failure with hypoxia: Secondary | ICD-10-CM

## 2022-06-13 DIAGNOSIS — M159 Polyosteoarthritis, unspecified: Secondary | ICD-10-CM

## 2022-06-13 NOTE — Progress Notes (Signed)
Location:  [facility] Room Number: 2 W Place of Service:  SNF (31)   CODE STATUS: DNR  Allergies  Allergen Reactions   Hydrocodone-Acetaminophen Nausea And Vomiting    Chief Complaint  Patient presents with   Medical Management of Chronic Issues                       Chronic respiratory failure with hypoxia:   Angina pectoris unspecified  Primary osteoarthritis of multiple joints:     HPI:  She is a 89 year old long term resident of this facility being seen for the management of her chronic illnesses including:   Chronic respiratory failure with hypoxia:   Angina pectoris unspecified  Primary osteoarthritis of multiple joints. She has been started on voltaren gel to her right knee due to arthritic pain. No further complaints of pain. Her weight remains stable.   Past Medical History:  Diagnosis Date   Anxiety    Dementia (HCC)    GERD (gastroesophageal reflux disease)    Goiter    Hypertension    Major depression, recurrent, chronic (HCC) 02/22/2019   Osteoarthritis    Osteoporosis    Vitamin D insufficiency     Past Surgical History:  Procedure Laterality Date   APPENDECTOMY     KIDNEY STONE SURGERY     ORIF HIP FRACTURE Right 11/20/2012   Procedure: OPEN REDUCTION INTERNAL FIXATION RIGHT HIP;  Surgeon: Darreld Mclean, MD;  Location: [facility];  Service: Orthopedics;  Laterality: Right;   stress fractures of legs     TUBAL LIGATION      Social History   Socioeconomic History   Marital status: Widowed    Spouse name: Not on file   Number of children: Not on file   Years of education: Not on file   Highest education level: Not on file  Occupational History   Occupation: retired  Tobacco Use   Smoking status: Never   Smokeless tobacco: Never  Vaping Use   Vaping Use: Never used  Substance and Sexual Activity   Alcohol use: No   Drug use: No   Sexual activity: Not Currently  Other Topics Concern   Not on file  Social History  Narrative   Long term resident of [facility]    Social Determinants of Health   Financial Resource Strain: Not on file  Food Insecurity: Not on file  Transportation Needs: Not on file  Physical Activity: Not on file  Stress: Not on file  Social Connections: Not on file  Intimate Partner Violence: Not on file   Family History  Problem Relation Age of Onset   Heart attack Father    Heart disease Neg Hx       VITAL SIGNS BP 120/69   Pulse 87   Temp 98.3 F (36.8 C)   Resp 18   Ht 5' (1.524 m)   Wt 114 lb (51.7 kg)   SpO2 95%   BMI 22.26 kg/m   Outpatient Encounter Medications as of 06/13/2022  Medication Sig   acetaminophen (TYLENOL) 325 MG tablet Take 650 mg by mouth 2 (two) times daily. 10 am and 10 pm   diclofenac Sodium (VOLTAREN) 1 % GEL Apply topically 2 (two) times daily. Special Instructions: Apply to right knee bid d/t discomfort   losartan (COZAAR) 25 MG tablet Take 25 mg by mouth daily.   methimazole (TAPAZOLE) 5 MG tablet Take 5 mg by mouth daily.   methocarbamol (ROBAXIN)  500 MG tablet Take 0.5 tablets (250 mg total) by mouth every 8 (eight) hours as needed for muscle spasms.   mirtazapine (REMERON) 7.5 MG tablet Take 7.5 mg by mouth at bedtime.   NON FORMULARY Diet:Dysphagia 3 (mechanical soft), thin liquids. OK to have soft sandwiches per LPN request.   Nutritional Supplements (ENSURE ENLIVE PO) Take 1 Can by mouth daily.   omeprazole (PRILOSEC OTC) 20 MG tablet Take 20 mg by mouth daily.   OXYGEN Inhale 2 L into the lungs continuous.   [DISCONTINUED] acetaminophen (TYLENOL) 500 MG tablet Take 2 tablets (1,000 mg total) by mouth in the morning, at noon, and at bedtime for 14 days.   [DISCONTINUED] acetaminophen (TYLENOL) 650 MG CR tablet Take 650 mg by mouth every 4 (four) hours as needed for pain.   No facility-administered encounter medications on file as of 06/13/2022.     SIGNIFICANT DIAGNOSTIC EXAMS  PREVIOUS  09-20-21: dexa: t score:  -5.527  TODAY  05-31-22: right knee x-ray:  No definite radiographic evidence of acuate fracture or dislocation Mild joint effusion Mild degree of osteopenia Moderate osteoarthritis  05-31-22: right hip x-ray:  No radiographic evidence of acute fracture or dislocation S/p right ORIF with intramedullary rod and screw fixation of a healed intertrochanteric fracture with neal anatomic alignment.     06-10-22: right lower extremity venous doppler No DVT right lower extremity    LABS REVIEWED PREVIOUS    07-11-21: wbc 5.3; hgb 11.3; hct 35.4; mcv 94.9 plt 211; glucose 86; bun 19; creat 0.58; k+ 3.9; na++ 143; ca 8.9; gfr>60; protein 6.1; albumin 3.0  08-21-21: urine culture; morganella morganii : cipro  01-10-22: wbc 7.2; hgb 11.9; hct 37.6; mcv 994.2 plt 201; glucose 100; bun 16; creat 0.65; k+ 3.5; na++ 141; ca 9.0; protein 6.4; albumin 3.1 gfr >60;  vitamin D 42.24  TODAY  05-09-22: wbc 7.3; hgb 11.8; hct 38.2 plt 95.5 plt 181; glucose 120; bun 21; creat 0.73; k+ 4.3; na++ 141; ca 9.0; gfr >60; protein 7.2 albumin 3.1 tsh 0.221 vitamin B 12: 314 mag 2.5     Review of Systems  Reason unable to perform ROS: unable to fully participate.   Physical Exam Constitutional:      General: She is not in acute distress.    Appearance: She is well-developed. She is not diaphoretic.  Neck:     Thyroid: Thyroid mass and thyromegaly present.  Cardiovascular:     Rate and Rhythm: Normal rate and regular rhythm.     Pulses: Normal pulses.     Heart sounds: Normal heart sounds.  Pulmonary:     Effort: Pulmonary effort is normal. No respiratory distress.     Breath sounds: Normal breath sounds.  Abdominal:     General: Bowel sounds are normal. There is no distension.     Palpations: Abdomen is soft.     Tenderness: There is no abdominal tenderness.  Musculoskeletal:        General: Normal range of motion.     Cervical back: Neck supple.     Right lower leg: No edema.     Left lower leg: No edema.   Lymphadenopathy:     Cervical: No cervical adenopathy.  Skin:    General: Skin is warm and dry.  Neurological:     Mental Status: She is alert. Mental status is at baseline.  Psychiatric:        Mood and Affect: Mood normal.     ASSESSMENT/ PLAN:  TODAY  Chronic respiratory failure with hypoxia: is currently on room air  2. Angina pectoris unspecified no reports of chest pain  3. Primary osteoarthritis of multiple joints: will continue voltaren gel 2 gm to right knee twice daily and tylenol 650 mg twice daily   PREVIOUS   4. GERD without esophagitis: has history of GI bleed; will continue prilosec 20 mg daily  5. Vascular dementia without behavioral disturbance: weight is 114 pounds; is stable is off aricept; will continue remeron 7.5 mg nightly to maintain her appetite  6. Essential hypertension: b/p 120/69 will continue cozaar 25 mg nightly   7. Post menopausal osteoporosis: t score -5.527 is on supplements; is not on other medications due to her advanced age  11. Goiter: tsh 0.221 free t4: 0.89  9. Normochromic anemia hgb 11.8 will monitor   10. Severe protein calorie malnutrition: albumin 3.1 will continue supplements as directed  11. Aortic atherosclerosis (ct 02-17-19) no statin due to advanced age  71. Major depression chronic: will continue remeron 7.5 mg nightly has failed 2 weans; does help with appetite as well.      [name] NP [facility] Adult Medicine  call [phone]

## 2022-06-20 ENCOUNTER — Other Ambulatory Visit (HOSPITAL_COMMUNITY)
Admission: RE | Admit: 2022-06-20 | Discharge: 2022-06-20 | Disposition: A | Discharge status: Home / Self Care | Payer: Medicare Other | Source: Skilled Nursing Facility | Attending: Adult Health | Admitting: Adult Health

## 2022-06-20 DIAGNOSIS — Z79899 Other long term (current) drug therapy: Secondary | ICD-10-CM | POA: Insufficient documentation

## 2022-06-20 LAB — HEPATIC FUNCTION PANEL
ALT: 10 U/L (ref 0–44)
AST: 19 U/L (ref 15–41)
Albumin: 3 g/dL — ABNORMAL LOW (ref 3.5–5.0)
Alkaline Phosphatase: 49 U/L (ref 38–126)
Bilirubin, Direct: 0.1 mg/dL (ref 0.0–0.2)
Indirect Bilirubin: 0.5 mg/dL (ref 0.3–0.9)
Total Bilirubin: 0.6 mg/dL (ref 0.3–1.2)
Total Protein: 6.6 g/dL (ref 6.5–8.1)

## 2022-07-09 DIAGNOSIS — M6281 Muscle weakness (generalized): Secondary | ICD-10-CM | POA: Diagnosis not present

## 2022-07-09 DIAGNOSIS — R279 Unspecified lack of coordination: Secondary | ICD-10-CM | POA: Diagnosis not present

## 2022-07-09 DIAGNOSIS — M159 Polyosteoarthritis, unspecified: Secondary | ICD-10-CM | POA: Diagnosis not present

## 2022-07-09 DIAGNOSIS — J9611 Chronic respiratory failure with hypoxia: Secondary | ICD-10-CM | POA: Diagnosis not present

## 2022-07-10 DIAGNOSIS — M6281 Muscle weakness (generalized): Secondary | ICD-10-CM | POA: Diagnosis not present

## 2022-07-10 DIAGNOSIS — J9611 Chronic respiratory failure with hypoxia: Secondary | ICD-10-CM | POA: Diagnosis not present

## 2022-07-10 DIAGNOSIS — R279 Unspecified lack of coordination: Secondary | ICD-10-CM | POA: Diagnosis not present

## 2022-07-10 DIAGNOSIS — M159 Polyosteoarthritis, unspecified: Secondary | ICD-10-CM | POA: Diagnosis not present

## 2022-07-11 ENCOUNTER — Non-Acute Institutional Stay (SKILLED_NURSING_FACILITY): Payer: Medicare Other | Admitting: Adult Health

## 2022-07-11 ENCOUNTER — Encounter: Payer: Self-pay | Admitting: Adult Health

## 2022-07-11 DIAGNOSIS — K219 Gastro-esophageal reflux disease without esophagitis: Secondary | ICD-10-CM

## 2022-07-11 DIAGNOSIS — J9611 Chronic respiratory failure with hypoxia: Secondary | ICD-10-CM | POA: Diagnosis not present

## 2022-07-11 DIAGNOSIS — M159 Polyosteoarthritis, unspecified: Secondary | ICD-10-CM | POA: Diagnosis not present

## 2022-07-11 DIAGNOSIS — I10 Essential (primary) hypertension: Secondary | ICD-10-CM | POA: Diagnosis not present

## 2022-07-11 DIAGNOSIS — R279 Unspecified lack of coordination: Secondary | ICD-10-CM | POA: Diagnosis not present

## 2022-07-11 DIAGNOSIS — F01C0 Vascular dementia, severe, without behavioral disturbance, psychotic disturbance, mood disturbance, and anxiety: Secondary | ICD-10-CM

## 2022-07-11 DIAGNOSIS — M6281 Muscle weakness (generalized): Secondary | ICD-10-CM | POA: Diagnosis not present

## 2022-07-11 NOTE — Progress Notes (Signed)
Location:  [facility] Room Number: 114 Place of Service:  SNF (31)   CODE STATUS: dnr   Allergies  Allergen Reactions   Hydrocodone-Acetaminophen Nausea And Vomiting    Chief Complaint  Patient presents with   Medical Management of Chronic Issues       GERD without esophagitis: Vascular dementia without behavioral disturbance: Essential hypertension:     HPI:  She is a 89 year old long term resident of this facility being seen for the management of her chronic illnesses:  GERD without esophagitis: Vascular dementia without behavioral disturbance: Essential hypertension: there are no reports of uncontrolled pain. Her weight is stable; no reports of aneity or agitation.   Past Medical History:  Diagnosis Date   Anxiety    Dementia (HCC)    GERD (gastroesophageal reflux disease)    Goiter    Hypertension    Major depression, recurrent, chronic (HCC) 02/22/2019   Osteoarthritis    Osteoporosis    Vitamin D insufficiency     Past Surgical History:  Procedure Laterality Date   APPENDECTOMY     KIDNEY STONE SURGERY     ORIF HIP FRACTURE Right 11/20/2012   Procedure: OPEN REDUCTION INTERNAL FIXATION RIGHT HIP;  Surgeon: Darreld Mclean, MD;  Location: [facility];  Service: Orthopedics;  Laterality: Right;   stress fractures of legs     TUBAL LIGATION      Social History   Socioeconomic History   Marital status: Widowed    Spouse name: Not on file   Number of children: Not on file   Years of education: Not on file   Highest education level: Not on file  Occupational History   Occupation: retired  Tobacco Use   Smoking status: Never   Smokeless tobacco: Never  Vaping Use   Vaping Use: Never used  Substance and Sexual Activity   Alcohol use: No   Drug use: No   Sexual activity: Not Currently  Other Topics Concern   Not on file  Social History Narrative   Long term resident of [facility]    Social Determinants of Health   Financial Resource  Strain: Not on file  Food Insecurity: Not on file  Transportation Needs: Not on file  Physical Activity: Not on file  Stress: Not on file  Social Connections: Not on file  Intimate Partner Violence: Not on file   Family History  Problem Relation Age of Onset   Heart attack Father    Heart disease Neg Hx       VITAL SIGNS BP 127/69   Pulse 82   Temp 98 F (36.7 C)   Resp 20   Ht 5' (1.524 m)   Wt 113 lb (51.3 kg)   SpO2 95%   BMI 22.07 kg/m   Outpatient Encounter Medications as of 07/11/2022  Medication Sig   acetaminophen (TYLENOL) 325 MG tablet Take 650 mg by mouth 2 (two) times daily. 10 am and 10 pm   diclofenac Sodium (VOLTAREN) 1 % GEL Apply topically 2 (two) times daily. Special Instructions: Apply to right knee bid d/t discomfort   losartan (COZAAR) 25 MG tablet Take 25 mg by mouth daily.   methimazole (TAPAZOLE) 5 MG tablet Take 5 mg by mouth daily.   methocarbamol (ROBAXIN) 500 MG tablet Take 0.5 tablets (250 mg total) by mouth every 8 (eight) hours as needed for muscle spasms.   mirtazapine (REMERON) 7.5 MG tablet Take 7.5 mg by mouth at bedtime.  NON FORMULARY Diet:Dysphagia 3 (mechanical soft), thin liquids. OK to have soft sandwiches per LPN request.   Nutritional Supplements (ENSURE ENLIVE PO) Take 1 Can by mouth daily.   omeprazole (PRILOSEC OTC) 20 MG tablet Take 20 mg by mouth daily.   OXYGEN Inhale 2 L into the lungs continuous.   No facility-administered encounter medications on file as of 07/11/2022.     SIGNIFICANT DIAGNOSTIC EXAMS  PREVIOUS  09-20-21: dexa: t score: -5.527  05-31-22: right knee x-ray:  No definite radiographic evidence of acuate fracture or dislocation Mild joint effusion Mild degree of osteopenia Moderate osteoarthritis  05-31-22: right hip x-ray:  No radiographic evidence of acute fracture or dislocation S/p right ORIF with intramedullary rod and screw fixation of a healed intertrochanteric fracture with neal anatomic  alignment.     06-10-22: right lower extremity venous doppler No DVT right lower extremity   NO NEW EXAMS    LABS REVIEWED PREVIOUS    07-11-21: wbc 5.3; hgb 11.3; hct 35.4; mcv 94.9 plt 211; glucose 86; bun 19; creat 0.58; k+ 3.9; na++ 143; ca 8.9; gfr>60; protein 6.1; albumin 3.0  08-21-21: urine culture; morganella morganii : cipro  01-10-22: wbc 7.2; hgb 11.9; hct 37.6; mcv 994.2 plt 201; glucose 100; bun 16; creat 0.65; k+ 3.5; na++ 141; ca 9.0; protein 6.4; albumin 3.1 gfr >60;  vitamin D 42.24 05-09-22: wbc 7.3; hgb 11.8; hct 38.2 plt 95.5 plt 181; glucose 120; bun 21; creat 0.73; k+ 4.3; na++ 141; ca 9.0; gfr >60; protein 7.2 albumin 3.1 tsh 0.221 vitamin B 12: 314 mag 2.5  NO NEW LABS.      Review of Systems  Reason unable to perform ROS: unable to fully participate.   Physical Exam Constitutional:      General: She is not in acute distress.    Appearance: She is well-developed. She is not diaphoretic.  Neck:     Thyroid: Thyroid mass and thyromegaly present.  Cardiovascular:     Rate and Rhythm: Normal rate and regular rhythm.     Pulses: Normal pulses.     Heart sounds: Normal heart sounds.  Pulmonary:     Effort: Pulmonary effort is normal. No respiratory distress.     Breath sounds: Normal breath sounds.  Abdominal:     General: Bowel sounds are normal. There is no distension.     Palpations: Abdomen is soft.     Tenderness: There is no abdominal tenderness.  Musculoskeletal:        General: Normal range of motion.     Cervical back: Neck supple.     Right lower leg: No edema.     Left lower leg: No edema.  Lymphadenopathy:     Cervical: No cervical adenopathy.  Skin:    General: Skin is warm and dry.  Neurological:     Mental Status: She is alert. Mental status is at baseline.  Psychiatric:        Mood and Affect: Mood normal.     ASSESSMENT/ PLAN:  TODAY  GERD without esophagitis: has history of GI bleed; will continue prilosec 20 mg daily  2.  Vascular dementia without behavioral disturbance: weight is 113 pounds; is off remeron; will continue remeron 7.5 mg nightly to maintain her appetite  3. Essential hypertension: b/p 127/69: will continue cozaar 25 mg daily   PREVIOUS   4. Post menopausal osteoporosis: t score -5.527 is on supplements; is not on other medications due to her advanced age  89. Goiter: tsh  0.221 free t4: 0.89  6. Normochromic anemia hgb 11.8 will monitor   7. Severe protein calorie malnutrition: albumin 3.1 will continue supplements as directed  8. Aortic atherosclerosis (ct 02-17-19) no statin due to advanced age  83. Major depression chronic: will continue remeron 7.5 mg nightly has failed 2 weans; does help with appetite as well.   10. Chronic respiratory failure with hypoxia: is currently on room air  11. Angina pectoris unspecified no reports of chest pain  12. Primary osteoarthritis of multiple joints: will continue voltaren gel 2 gm to right knee twice daily and tylenol 650 mg twice daily     [name] NP [facility] Adult [facility]   call [phone]

## 2022-07-12 DIAGNOSIS — J9611 Chronic respiratory failure with hypoxia: Secondary | ICD-10-CM | POA: Diagnosis not present

## 2022-07-12 DIAGNOSIS — M6281 Muscle weakness (generalized): Secondary | ICD-10-CM | POA: Diagnosis not present

## 2022-07-12 DIAGNOSIS — R279 Unspecified lack of coordination: Secondary | ICD-10-CM | POA: Diagnosis not present

## 2022-07-12 DIAGNOSIS — M159 Polyosteoarthritis, unspecified: Secondary | ICD-10-CM | POA: Diagnosis not present

## 2022-07-15 DIAGNOSIS — M159 Polyosteoarthritis, unspecified: Secondary | ICD-10-CM | POA: Diagnosis not present

## 2022-07-15 DIAGNOSIS — R279 Unspecified lack of coordination: Secondary | ICD-10-CM | POA: Diagnosis not present

## 2022-07-15 DIAGNOSIS — J9611 Chronic respiratory failure with hypoxia: Secondary | ICD-10-CM | POA: Diagnosis not present

## 2022-07-15 DIAGNOSIS — M6281 Muscle weakness (generalized): Secondary | ICD-10-CM | POA: Diagnosis not present

## 2022-07-16 ENCOUNTER — Encounter: Payer: Self-pay | Admitting: Adult Health

## 2022-07-16 DIAGNOSIS — M6281 Muscle weakness (generalized): Secondary | ICD-10-CM | POA: Diagnosis not present

## 2022-07-16 DIAGNOSIS — J9611 Chronic respiratory failure with hypoxia: Secondary | ICD-10-CM | POA: Diagnosis not present

## 2022-07-16 DIAGNOSIS — M159 Polyosteoarthritis, unspecified: Secondary | ICD-10-CM | POA: Diagnosis not present

## 2022-07-16 DIAGNOSIS — R279 Unspecified lack of coordination: Secondary | ICD-10-CM | POA: Diagnosis not present

## 2022-07-16 NOTE — Progress Notes (Unsigned)
Location:  [facility] Room Number: 114W Place of Service:  SNF (31)   CODE STATUS: DNR  Allergies  Allergen Reactions   Hydrocodone-Acetaminophen Nausea And Vomiting    Chief Complaint  Patient presents with   Acute Visit    Oxygen saturation    HPI:    Past Medical History:  Diagnosis Date   Anxiety    Dementia (HCC)    GERD (gastroesophageal reflux disease)    Goiter    Hypertension    Major depression, recurrent, chronic (HCC) 02/22/2019   Osteoarthritis    Osteoporosis    Vitamin D insufficiency     Past Surgical History:  Procedure Laterality Date   APPENDECTOMY     KIDNEY STONE SURGERY     ORIF HIP FRACTURE Right 11/20/2012   Procedure: OPEN REDUCTION INTERNAL FIXATION RIGHT HIP;  Surgeon: Darreld Mclean, MD;  Location: [facility];  Service: Orthopedics;  Laterality: Right;   stress fractures of legs     TUBAL LIGATION      Social History   Socioeconomic History   Marital status: Widowed    Spouse name: Not on file   Number of children: Not on file   Years of education: Not on file   Highest education level: Not on file  Occupational History   Occupation: retired  Tobacco Use   Smoking status: Never   Smokeless tobacco: Never  Vaping Use   Vaping Use: Never used  Substance and Sexual Activity   Alcohol use: No   Drug use: No   Sexual activity: Not Currently  Other Topics Concern   Not on file  Social History Narrative   Long term resident of [facility]    Social Determinants of Health   Financial Resource Strain: Not on file  Food Insecurity: Not on file  Transportation Needs: Not on file  Physical Activity: Not on file  Stress: Not on file  Social Connections: Not on file  Intimate Partner Violence: Not on file   Family History  Problem Relation Age of Onset   Heart attack Father    Heart disease Neg Hx       VITAL SIGNS BP 128/78   Pulse 74   Temp 98.6 F (37 C)   Resp 20   Ht 5' (1.524 m)   Wt 113 lb 1.6  oz (51.3 kg)   SpO2 96%   BMI 22.09 kg/m   Outpatient Encounter Medications as of 07/16/2022  Medication Sig   acetaminophen (TYLENOL) 325 MG tablet Take 650 mg by mouth 2 (two) times daily. 10 am and 10 pm   diclofenac Sodium (VOLTAREN) 1 % GEL Apply topically 2 (two) times daily. Special Instructions: Apply to right knee bid d/t discomfort   ipratropium-albuterol (DUONEB) 0.5-2.5 (3) MG/3ML SOLN Take 3 mLs by nebulization every 6 (six) hours.   losartan (COZAAR) 25 MG tablet Take 25 mg by mouth daily.   methimazole (TAPAZOLE) 5 MG tablet Take 5 mg by mouth daily.   methocarbamol (ROBAXIN) 500 MG tablet Take 0.5 tablets (250 mg total) by mouth every 8 (eight) hours as needed for muscle spasms.   mirtazapine (REMERON) 7.5 MG tablet Take 7.5 mg by mouth at bedtime.   NON FORMULARY Diet:Dysphagia 3 (mechanical soft), thin liquids. OK to have soft sandwiches per LPN request.   Nutritional Supplements (ENSURE ENLIVE PO) Take 1 Can by mouth daily.   omeprazole (PRILOSEC OTC) 20 MG tablet Take 20 mg by mouth daily.   OXYGEN Inhale  2 L into the lungs continuous.   No facility-administered encounter medications on file as of 07/16/2022.     SIGNIFICANT DIAGNOSTIC EXAMS       ASSESSMENT/ PLAN:     [name] NP [facility] Adult Medicine  Contact [phone] Monday through Friday 8am- 5pm  After hours call [phone]

## 2022-07-17 DIAGNOSIS — J9611 Chronic respiratory failure with hypoxia: Secondary | ICD-10-CM | POA: Diagnosis not present

## 2022-07-17 DIAGNOSIS — R279 Unspecified lack of coordination: Secondary | ICD-10-CM | POA: Diagnosis not present

## 2022-07-17 DIAGNOSIS — M159 Polyosteoarthritis, unspecified: Secondary | ICD-10-CM | POA: Diagnosis not present

## 2022-07-17 DIAGNOSIS — M6281 Muscle weakness (generalized): Secondary | ICD-10-CM | POA: Diagnosis not present

## 2022-07-17 NOTE — Progress Notes (Signed)
This encounter was created in error - please disregard.

## 2022-07-18 DIAGNOSIS — R279 Unspecified lack of coordination: Secondary | ICD-10-CM | POA: Diagnosis not present

## 2022-07-18 DIAGNOSIS — J9611 Chronic respiratory failure with hypoxia: Secondary | ICD-10-CM | POA: Diagnosis not present

## 2022-07-18 DIAGNOSIS — M159 Polyosteoarthritis, unspecified: Secondary | ICD-10-CM | POA: Diagnosis not present

## 2022-07-18 DIAGNOSIS — M6281 Muscle weakness (generalized): Secondary | ICD-10-CM | POA: Diagnosis not present

## 2022-07-19 DIAGNOSIS — M159 Polyosteoarthritis, unspecified: Secondary | ICD-10-CM | POA: Diagnosis not present

## 2022-07-19 DIAGNOSIS — M6281 Muscle weakness (generalized): Secondary | ICD-10-CM | POA: Diagnosis not present

## 2022-07-19 DIAGNOSIS — J9611 Chronic respiratory failure with hypoxia: Secondary | ICD-10-CM | POA: Diagnosis not present

## 2022-07-19 DIAGNOSIS — R279 Unspecified lack of coordination: Secondary | ICD-10-CM | POA: Diagnosis not present

## 2022-07-22 DIAGNOSIS — M6281 Muscle weakness (generalized): Secondary | ICD-10-CM | POA: Diagnosis not present

## 2022-07-22 DIAGNOSIS — J9611 Chronic respiratory failure with hypoxia: Secondary | ICD-10-CM | POA: Diagnosis not present

## 2022-07-22 DIAGNOSIS — R279 Unspecified lack of coordination: Secondary | ICD-10-CM | POA: Diagnosis not present

## 2022-07-22 DIAGNOSIS — M159 Polyosteoarthritis, unspecified: Secondary | ICD-10-CM | POA: Diagnosis not present

## 2022-07-23 DIAGNOSIS — M159 Polyosteoarthritis, unspecified: Secondary | ICD-10-CM | POA: Diagnosis not present

## 2022-07-23 DIAGNOSIS — M6281 Muscle weakness (generalized): Secondary | ICD-10-CM | POA: Diagnosis not present

## 2022-07-23 DIAGNOSIS — J9611 Chronic respiratory failure with hypoxia: Secondary | ICD-10-CM | POA: Diagnosis not present

## 2022-07-23 DIAGNOSIS — R279 Unspecified lack of coordination: Secondary | ICD-10-CM | POA: Diagnosis not present

## 2022-07-24 DIAGNOSIS — M159 Polyosteoarthritis, unspecified: Secondary | ICD-10-CM | POA: Diagnosis not present

## 2022-07-24 DIAGNOSIS — M6281 Muscle weakness (generalized): Secondary | ICD-10-CM | POA: Diagnosis not present

## 2022-07-24 DIAGNOSIS — J9611 Chronic respiratory failure with hypoxia: Secondary | ICD-10-CM | POA: Diagnosis not present

## 2022-07-24 DIAGNOSIS — R279 Unspecified lack of coordination: Secondary | ICD-10-CM | POA: Diagnosis not present

## 2022-08-09 ENCOUNTER — Non-Acute Institutional Stay (SKILLED_NURSING_FACILITY): Payer: Medicare Other | Admitting: Adult Health

## 2022-08-09 ENCOUNTER — Encounter: Payer: Self-pay | Admitting: Adult Health

## 2022-08-09 DIAGNOSIS — E440 Moderate protein-calorie malnutrition: Secondary | ICD-10-CM

## 2022-08-09 DIAGNOSIS — D649 Anemia, unspecified: Secondary | ICD-10-CM

## 2022-08-09 DIAGNOSIS — M810 Age-related osteoporosis without current pathological fracture: Secondary | ICD-10-CM | POA: Diagnosis not present

## 2022-08-09 DIAGNOSIS — E049 Nontoxic goiter, unspecified: Secondary | ICD-10-CM | POA: Diagnosis not present

## 2022-08-09 NOTE — Progress Notes (Unsigned)
Location:  [facility]   Place of Service:  SNF (31)   CODE STATUS: DNR  Allergies  Allergen Reactions   Hydrocodone-Acetaminophen Nausea And Vomiting    Chief Complaint  Patient presents with   Medical Management of Chronic Issues    Patient is being seen for Medical management of chronic issues   Health Maintenance    Discussed the need for the updated covid vaccine and AWV    HPI:    Past Medical History:  Diagnosis Date   Anxiety    Dementia (HCC)    GERD (gastroesophageal reflux disease)    Goiter    Hypertension    Major depression, recurrent, chronic (HCC) 02/22/2019   Osteoarthritis    Osteoporosis    Vitamin D insufficiency     Past Surgical History:  Procedure Laterality Date   APPENDECTOMY     KIDNEY STONE SURGERY     ORIF HIP FRACTURE Right 11/20/2012   Procedure: OPEN REDUCTION INTERNAL FIXATION RIGHT HIP;  Surgeon: Darreld Mclean, MD;  Location: [facility];  Service: Orthopedics;  Laterality: Right;   stress fractures of legs     TUBAL LIGATION      Social History   Socioeconomic History   Marital status: Widowed    Spouse name: Not on file   Number of children: Not on file   Years of education: Not on file   Highest education level: Not on file  Occupational History   Occupation: retired  Tobacco Use   Smoking status: Never   Smokeless tobacco: Never  Vaping Use   Vaping Use: Never used  Substance and Sexual Activity   Alcohol use: No   Drug use: No   Sexual activity: Not Currently  Other Topics Concern   Not on file  Social History Narrative   Long term resident of [facility]    Social Determinants of Health   Financial Resource Strain: Not on file  Food Insecurity: Not on file  Transportation Needs: Not on file  Physical Activity: Not on file  Stress: Not on file  Social Connections: Not on file  Intimate Partner Violence: Not on file   Family History  Problem Relation Age of Onset   Heart attack Father    Heart disease  Neg Hx       VITAL SIGNS BP 116/78   Pulse 82   Temp (!) 96.7 F (35.9 C) (Temporal)   Resp 20   Ht 5' (1.524 m)   Wt 99 lb 14.4 oz (45.3 kg)   SpO2 97%   BMI 19.51 kg/m   Outpatient Encounter Medications as of 08/09/2022  Medication Sig   acetaminophen (TYLENOL) 325 MG tablet Take 650 mg by mouth 2 (two) times daily. 10 am and 10 pm   Balsam [place]-Castor Oil (VENELEX) OINT Apply 1 Application topically.   losartan (COZAAR) 25 MG tablet Take 25 mg by mouth daily.   methimazole (TAPAZOLE) 5 MG tablet Take 5 mg by mouth daily.   methocarbamol (ROBAXIN) 500 MG tablet Take 0.5 tablets (250 mg total) by mouth every 8 (eight) hours as needed for muscle spasms.   mirtazapine (REMERON) 7.5 MG tablet Take 7.5 mg by mouth at bedtime.   NON FORMULARY Diet:Dysphagia 3 (mechanical soft), thin liquids. OK to have soft sandwiches per LPN request.   Nutritional Supplements (ENSURE ENLIVE PO) Take 1 Can by mouth daily.   omeprazole (PRILOSEC OTC) 20 MG tablet Take 20 mg by mouth daily.   OXYGEN Inhale 2 L  into the lungs continuous.   diclofenac Sodium (VOLTAREN) 1 % GEL Apply topically 2 (two) times daily. Special Instructions: Apply to right knee bid d/t discomfort (Patient not taking: Reported on 08/09/2022)   ipratropium-albuterol (DUONEB) 0.5-2.5 (3) MG/3ML SOLN Take 3 mLs by nebulization every 6 (six) hours. (Patient not taking: Reported on 08/09/2022)   No facility-administered encounter medications on file as of 08/09/2022.     SIGNIFICANT DIAGNOSTIC EXAMS       ASSESSMENT/ PLAN:     [name] NP [facility] Adult Medicine  Contact [phone] Monday through Friday 8am- 5pm  After hours call [phone]

## 2022-08-22 ENCOUNTER — Encounter: Payer: Self-pay | Admitting: Adult Health

## 2022-08-22 ENCOUNTER — Non-Acute Institutional Stay (SKILLED_NURSING_FACILITY): Payer: Medicare Other | Admitting: Adult Health

## 2022-08-22 DIAGNOSIS — E049 Nontoxic goiter, unspecified: Secondary | ICD-10-CM

## 2022-08-22 DIAGNOSIS — R634 Abnormal weight loss: Secondary | ICD-10-CM

## 2022-08-22 NOTE — Progress Notes (Signed)
Location:  [facility] Room Number: 114 Place of Service:  SNF (31)   CODE STATUS: dnr  Allergies  Allergen Reactions   Hydrocodone-Acetaminophen Nausea And Vomiting    Chief Complaint  Patient presents with   Acute Visit    Weight loss     HPI:  She is losing weight: 06-05-22: weight was 114.8 pounds; 07-09-22: 113.1 pounds; 08-05-22: 99.8 pounds. There are no reports of changes in her appetite. She is on remeron 7.5 mg nightly and she has failed coming off this medication twice to help maintain her appetite. She does have a goiter with a low tsh. Will need to recheck her labs.   Past Medical History:  Diagnosis Date   Anxiety    Dementia (HCC)    GERD (gastroesophageal reflux disease)    Goiter    Hypertension    Major depression, recurrent, chronic (HCC) 02/22/2019   Osteoarthritis    Osteoporosis    Vitamin D insufficiency     Past Surgical History:  Procedure Laterality Date   APPENDECTOMY     KIDNEY STONE SURGERY     ORIF HIP FRACTURE Right 11/20/2012   Procedure: OPEN REDUCTION INTERNAL FIXATION RIGHT HIP;  Surgeon: Darreld Mclean, MD;  Location: [facility];  Service: Orthopedics;  Laterality: Right;   stress fractures of legs     TUBAL LIGATION      Social History   Socioeconomic History   Marital status: Widowed    Spouse name: Not on file   Number of children: Not on file   Years of education: Not on file   Highest education level: Not on file  Occupational History   Occupation: retired  Tobacco Use   Smoking status: Never   Smokeless tobacco: Never  Vaping Use   Vaping status: Never Used  Substance and Sexual Activity   Alcohol use: No   Drug use: No   Sexual activity: Not Currently  Other Topics Concern   Not on file  Social History Narrative   Long term resident of [facility]    Social Determinants of Health   Financial Resource Strain: Not on file  Food Insecurity: Not on file  Transportation Needs: Not on file  Physical  Activity: Not on file  Stress: Not on file  Social Connections: Not on file  Intimate Partner Violence: Not on file   Family History  Problem Relation Age of Onset   Heart attack Father    Heart disease Neg Hx       VITAL SIGNS BP 117/60   Pulse 68   Temp 97.9 F (36.6 C)   Resp 20   Ht 5' (1.524 m)   Wt 99 lb 12.8 oz (45.3 kg)   SpO2 96%   BMI 19.49 kg/m   Outpatient Encounter Medications as of 08/22/2022  Medication Sig   acetaminophen (TYLENOL) 325 MG tablet Take 650 mg by mouth 2 (two) times daily. 10 am and 10 pm   Balsam [place]-Castor Oil (VENELEX) OINT Apply 1 Application topically.   diclofenac Sodium (VOLTAREN) 1 % GEL Apply topically 2 (two) times daily. Special Instructions: Apply to right knee bid d/t discomfort (Patient not taking: Reported on 08/09/2022)   ipratropium-albuterol (DUONEB) 0.5-2.5 (3) MG/3ML SOLN Take 3 mLs by nebulization every 6 (six) hours. (Patient not taking: Reported on 08/09/2022)   losartan (COZAAR) 25 MG tablet Take 25 mg by mouth daily.   methimazole (TAPAZOLE) 5 MG tablet Take 5 mg by mouth daily.  methocarbamol (ROBAXIN) 500 MG tablet Take 0.5 tablets (250 mg total) by mouth every 8 (eight) hours as needed for muscle spasms.   mirtazapine (REMERON) 7.5 MG tablet Take 7.5 mg by mouth at bedtime.   NON FORMULARY Diet:Dysphagia 3 (mechanical soft), thin liquids. OK to have soft sandwiches per LPN request.   Nutritional Supplements (ENSURE ENLIVE PO) Take 1 Can by mouth daily.   omeprazole (PRILOSEC OTC) 20 MG tablet Take 20 mg by mouth daily.   OXYGEN Inhale 2 L into the lungs continuous.   No facility-administered encounter medications on file as of 08/22/2022.     SIGNIFICANT DIAGNOSTIC EXAMS  PREVIOUS  09-20-21: dexa: t score: -5.527  05-31-22: right knee x-ray:  No definite radiographic evidence of acuate fracture or dislocation Mild joint effusion Mild degree of osteopenia Moderate osteoarthritis  05-31-22: right hip x-ray:  No  radiographic evidence of acute fracture or dislocation S/p right ORIF with intramedullary rod and screw fixation of a healed intertrochanteric fracture with neal anatomic alignment.     06-10-22: right lower extremity venous doppler No DVT right lower extremity   NO NEW EXAMS    LABS REVIEWED PREVIOUS    08-21-21: urine culture; morganella morganii : cipro  01-10-22: wbc 7.2; hgb 11.9; hct 37.6; mcv 994.2 plt 201; glucose 100; bun 16; creat 0.65; k+ 3.5; na++ 141; ca 9.0; protein 6.4; albumin 3.1 gfr >60;  vitamin D 42.24 05-09-22: wbc 7.3; hgb 11.8; hct 38.2 plt 95.5 plt 181; glucose 120; bun 21; creat 0.73; k+ 4.3; na++ 141; ca 9.0; gfr >60; protein 7.2 albumin 3.1 tsh 0.221 vitamin B 12: 314 mag 2.5 06-20-22: protein 6.6; albumin 3.0  NO NEW LABS.      Review of Systems  Reason unable to perform ROS: unable to fully participate.    Physical Exam Constitutional:      General: She is not in acute distress.    Appearance: She is underweight. She is not diaphoretic.  Neck:     Thyroid: Thyroid mass and thyromegaly present.  Cardiovascular:     Rate and Rhythm: Normal rate and regular rhythm.     Pulses: Normal pulses.     Heart sounds: Normal heart sounds.  Pulmonary:     Effort: Pulmonary effort is normal. No respiratory distress.     Breath sounds: Normal breath sounds.  Abdominal:     General: Bowel sounds are normal. There is no distension.     Palpations: Abdomen is soft.     Tenderness: There is no abdominal tenderness.  Musculoskeletal:        General: Normal range of motion.     Cervical back: Neck supple.     Right lower leg: No edema.     Left lower leg: No edema.  Lymphadenopathy:     Cervical: No cervical adenopathy.  Skin:    General: Skin is warm and dry.  Neurological:     Mental Status: She is alert. Mental status is at baseline.  Psychiatric:        Mood and Affect: Mood normal.       ASSESSMENT/ PLAN:  TODAY:   Goiter Weight loss  Will check  tsh; free t4; free t3. Will monitor her status.    [name] NP [facility] Adult Medicine  call [phone]

## 2022-08-27 ENCOUNTER — Other Ambulatory Visit (HOSPITAL_COMMUNITY)
Admission: RE | Admit: 2022-08-27 | Discharge: 2022-08-27 | Disposition: A | Discharge status: Home / Self Care | Payer: Medicare Other | Source: Skilled Nursing Facility | Attending: Adult Health | Admitting: Adult Health

## 2022-08-27 DIAGNOSIS — I10 Essential (primary) hypertension: Secondary | ICD-10-CM | POA: Diagnosis not present

## 2022-08-27 LAB — TSH: TSH: 2.59 u[IU]/mL (ref 0.350–4.500)

## 2022-08-27 LAB — T4, FREE: Free T4: 0.81 ng/dL (ref 0.61–1.12)

## 2022-08-28 LAB — T3, FREE: T3, Free: 2.5 pg/mL (ref 2.0–4.4)

## 2022-09-04 ENCOUNTER — Non-Acute Institutional Stay (INDEPENDENT_AMBULATORY_CARE_PROVIDER_SITE_OTHER): Payer: Medicare Other | Admitting: Adult Health

## 2022-09-04 DIAGNOSIS — Z0000 Encounter for general adult medical examination without abnormal findings: Secondary | ICD-10-CM

## 2022-09-04 NOTE — Progress Notes (Signed)
Subjective:   [name] is a 89 y.o. female who presents for Medicare Annual (Subsequent) preventive examination.  Visit Complete: In person  Patient Medicare AWV questionnaire was completed by the patient on ; I have confirmed that all information answered by patient is correct and no changes since this date.  Review of Systems    Review of Systems  Reason unable to perform ROS: unable to fully participate.    Cardiac Risk Factors include: advanced age (>10men, >59 women);sedentary lifestyle     Objective:    Today's Vitals   09/04/22 1020  BP: 124/72  Pulse: 74  Resp: 20  Temp: (!) 97 F (36.1 C)  SpO2: 97%  Weight: 116 lb 3.2 oz (52.7 kg)  Height: 5' (1.524 m)   Body mass index is 22.69 kg/m.     08/09/2022    9:46 AM 07/16/2022   11:09 AM 06/13/2022    9:26 AM 06/03/2022   11:46 AM 05/09/2022   11:24 AM 04/11/2022   10:49 AM 03/12/2022   10:27 AM  Advanced Directives  Does Patient Have a Medical Advance Directive? Yes Yes Yes Yes Yes Yes Yes  Type of Estate agent of [place];Living will;Out of facility DNR (pink MOST or yellow form) Healthcare Power of [place];Living will;Out of facility DNR (pink MOST or yellow form) Healthcare Power of [place];Out of facility DNR (pink MOST or yellow form);Living will Healthcare Power of [place];Out of facility DNR (pink MOST or yellow form);Living will Healthcare Power of [place];Out of facility DNR (pink MOST or yellow form);Living will Healthcare Power of [place];Living will;Out of facility DNR (pink MOST or yellow form) Healthcare Power of [place];Living will;Out of facility DNR (pink MOST or yellow form)  Does patient want to make changes to medical advance directive? No - Patient declined No - Patient declined No - Patient declined No - Patient declined No - Patient declined No - Patient declined No - Patient declined  Copy of Healthcare Power of Attorney in Chart? Yes - validated most recent copy  scanned in chart (See row information) Yes - validated most recent copy scanned in chart (See row information) Yes - validated most recent copy scanned in chart (See row information) Yes - validated most recent copy scanned in chart (See row information) Yes - validated most recent copy scanned in chart (See row information) Yes - validated most recent copy scanned in chart (See row information) Yes - validated most recent copy scanned in chart (See row information)  Pre-existing out of facility DNR order (yellow form or pink MOST form) Yellow form placed in chart (order not valid for inpatient use) Yellow form placed in chart (order not valid for inpatient use) Yellow form placed in chart (order not valid for inpatient use) Yellow form placed in chart (order not valid for inpatient use)  Yellow form placed in chart (order not valid for inpatient use) Yellow form placed in chart (order not valid for inpatient use)    Current Medications (verified) Outpatient Encounter Medications as of 09/04/2022  Medication Sig   acetaminophen (TYLENOL) 325 MG tablet Take 650 mg by mouth 2 (two) times daily. 10 am and 10 pm   Balsam [place]-Castor Oil (VENELEX) OINT Apply 1 Application topically.   diclofenac Sodium (VOLTAREN) 1 % GEL Apply topically 2 (two) times daily. Special Instructions: Apply to right knee bid d/t discomfort (Patient not taking: Reported on 08/09/2022)   ipratropium-albuterol (DUONEB) 0.5-2.5 (3) MG/3ML SOLN Take 3 mLs by nebulization every 6 (  six) hours. (Patient not taking: Reported on 08/09/2022)   losartan (COZAAR) 25 MG tablet Take 25 mg by mouth daily.   methimazole (TAPAZOLE) 5 MG tablet Take 5 mg by mouth daily.   methocarbamol (ROBAXIN) 500 MG tablet Take 0.5 tablets (250 mg total) by mouth every 8 (eight) hours as needed for muscle spasms.   mirtazapine (REMERON) 7.5 MG tablet Take 7.5 mg by mouth at bedtime.   NON FORMULARY Diet:Dysphagia 3 (mechanical soft), thin liquids. OK to have soft  sandwiches per LPN request.   Nutritional Supplements (ENSURE ENLIVE PO) Take 1 Can by mouth daily.   omeprazole (PRILOSEC OTC) 20 MG tablet Take 20 mg by mouth daily.   OXYGEN Inhale 2 L into the lungs continuous.   No facility-administered encounter medications on file as of 09/04/2022.    Allergies (verified) Hydrocodone-acetaminophen   History: Past Medical History:  Diagnosis Date   Anxiety    Dementia (HCC)    GERD (gastroesophageal reflux disease)    Goiter    Hypertension    Major depression, recurrent, chronic (HCC) 02/22/2019   Osteoarthritis    Osteoporosis    Vitamin D insufficiency    Past Surgical History:  Procedure Laterality Date   APPENDECTOMY     KIDNEY STONE SURGERY     ORIF HIP FRACTURE Right 11/20/2012   Procedure: OPEN REDUCTION INTERNAL FIXATION RIGHT HIP;  Surgeon: Darreld Mclean, MD;  Location: [facility];  Service: Orthopedics;  Laterality: Right;   stress fractures of legs     TUBAL LIGATION     Family History  Problem Relation Age of Onset   Heart attack Father    Heart disease Neg Hx    Social History   Socioeconomic History   Marital status: Widowed    Spouse name: Not on file   Number of children: Not on file   Years of education: Not on file   Highest education level: Not on file  Occupational History   Occupation: retired  Tobacco Use   Smoking status: Never   Smokeless tobacco: Never  Vaping Use   Vaping status: Never Used  Substance and Sexual Activity   Alcohol use: No   Drug use: No   Sexual activity: Not Currently  Other Topics Concern   Not on file  Social History Narrative   Long term resident of [facility]    Social Determinants of Health   Financial Resource Strain: Not on file  Food Insecurity: Not on file  Transportation Needs: Not on file  Physical Activity: Not on file  Stress: Not on file  Social Connections: Not on file    Tobacco Counseling Counseling given: Not Answered   Clinical Intake:  Pre-visit  preparation completed: Yes  Pain : No/denies pain     BMI - recorded: 22.69 Nutritional Status: BMI of 19-24  Normal Nutritional Risks: Unintentional weight loss, Failure to thrive Diabetes: No  How often do you need to have someone help you when you read instructions, pamphlets, or other written materials from your doctor or pharmacy?: 5 - Always  Interpreter Needed?: No      Activities of Daily Living    09/04/2022   10:24 AM  In your present state of health, do you have any difficulty performing the following activities:  Hearing? 1  Vision? 0  Difficulty concentrating or making decisions? 1  Walking or climbing stairs? 1  Dressing or bathing? 1  Doing errands, shopping? 1  Preparing Food and eating ? Y  Using  the Toilet? Y  In the past six months, have you accidently leaked urine? Y  Do you have problems with loss of bowel control? Y  Managing your Medications? Y  Managing your Finances? Y  Housekeeping or managing your Housekeeping? Y    Patient Care Team: Sharee Holster, NP as PCP - General (Geriatric Medicine) Laqueta Linden, MD (Inactive) as PCP - Cardiology ([facility]  Indicate any recent Medical Services you may have received from other than [facility] providers in the past year (date may be approximate).     Assessment:   This is a routine wellness examination for [name].  Hearing/Vision screen No results found.  Dietary issues and exercise activities discussed:     Goals Addressed             This Visit's Progress    DIET - INCREASE WATER INTAKE   On track    Follow up with Primary Care Provider   On track    General - Client will not be readmitted within 30 days ([facility])   On track      Depression Screen    09/04/2022   10:25 AM 03/13/2022    9:51 AM 08/27/2021    3:42 PM 07/09/2021    1:25 PM 03/26/2021   12:32 PM 02/13/2021   12:26 PM 08/22/2020    9:47 AM  PHQ 2/9 Scores  PHQ - 2  Score 0 0     0  Exception Documentation    Other- indicate reason in comment box Medical reason    Not completed   unable to participate unable to participate unable to participate unable to participate     Fall Risk    09/04/2022   10:25 AM 03/12/2022   10:27 AM 08/27/2021    3:41 PM 07/09/2021    1:25 PM 03/26/2021   12:33 PM  Fall Risk   Falls in the past year? 0 0 1 0 0  Number falls in past yr: 0 0 1 0 0  Injury with Fall? 0 0 0 0 0  Risk for fall due to : Impaired balance/gait;Impaired mobility No Fall Risks History of fall(s);Impaired balance/gait;Impaired mobility Impaired balance/gait;Impaired mobility Impaired balance/gait;Impaired mobility  Follow up  Falls evaluation completed       MEDICARE RISK AT HOME:  Medicare Risk at Home - 09/04/22 1025     Any stairs in or around the home? Yes    If so, are there any without handrails? No    Home free of loose throw rugs in walkways, pet beds, electrical cords, etc? Yes    Adequate lighting in your home to reduce risk of falls? Yes    Life alert? No    Use of a cane, walker or w/c? Yes    Grab bars in the bathroom? Yes    Shower chair or bench in shower? Yes    Elevated toilet seat or a handicapped toilet? Yes             TIMED UP AND GO:  Was the test performed?  No    Cognitive Function:    09/04/2022   10:26 AM 08/27/2021    3:44 PM 08/22/2020    9:52 AM  MMSE - Mini Mental State Exam  Not completed: Unable to complete Unable to complete Unable to complete        08/22/2020    9:52 AM  6CIT Screen  What Year? --  Immunizations Immunization History  Administered Date(s) Administered   Influenza,inj,Quad PF,6+ Mos 11/08/2020   Influenza-Unspecified 11/26/2018, 11/12/2019, 11/06/2021   MMR 04/06/2015   Moderna Covid-19 Vaccine Bivalent Booster 29yrs & up 11/28/2020   Moderna SARS-COV2 Booster Vaccination 05/17/2020, 06/26/2021   Moderna Sars-Covid-2 Vaccination 05/06/2019, 06/02/2019, 12/09/2019,  11/28/2021   PNEUMOCOCCAL CONJUGATE-20 07/20/2021   Pneumococcal Conjugate-13 04/06/2015   Pneumococcal Polysaccharide-23 11/16/2001   RSV,unspecified 01/07/2022   Td 03/08/2011   Tdap 03/08/2011, 04/12/2021   Zoster Recombinant(Shingrix) 10/03/2020, 01/05/2021    TDAP status: Up to date  Flu Vaccine status: Up to date  Pneumococcal vaccine status: Up to date  Covid-19 vaccine status: Completed vaccines  Qualifies for Shingles Vaccine? No   Zostavax completed Yes   Shingrix Completed?: No.    Education has been provided regarding the importance of this vaccine. Patient has been advised to call insurance company to determine out of pocket expense if they have not yet received this vaccine. Advised may also receive vaccine at local pharmacy or Health Dept. Verbalized acceptance and understanding.  Screening Tests Health Maintenance  Topic Date Due   COVID-19 Vaccine (8 - 2023-24 season) 01/23/2022   Medicare Annual Wellness (AWV)  08/28/2022   INFLUENZA VACCINE  09/05/2022   DTaP/Tdap/Td (4 - Td or Tdap) 04/13/2031   Pneumonia Vaccine 59+ Years old  Completed   DEXA SCAN  Completed   Zoster Vaccines- Shingrix  Completed   HPV VACCINES  Aged Out    Health Maintenance  Health Maintenance Due  Topic Date Due   COVID-19 Vaccine (8 - 2023-24 season) 01/23/2022   Medicare Annual Wellness (AWV)  08/28/2022    Colorectal cancer screening: No longer required.   Mammogram status: No longer required due to age.  Bone Density status: Completed 09-20-21. Results reflect: Bone density results: OSTEOPOROSIS. Repeat every   years.  Lung Cancer Screening: (Low Dose CT Chest recommended if Age 17-80 years, 20 pack-year currently smoking OR have quit w/in 15years.) does not qualify.   Lung Cancer Screening Referral: n/a  Additional Screening:  Hepatitis C Screening: does not qualify; Completed   Vision Screening: Recommended annual ophthalmology exams for early detection of  glaucoma and other disorders of the eye. Is the patient up to date with their annual eye exam?  No  Who is the provider or what is the name of the office in which the patient attends annual eye exams?  If pt is not established with a provider, would they like to be referred to a provider to establish care? No .   Dental Screening: Recommended annual dental exams for proper oral hygiene  Diabetic Foot Exam:   Community Resource Referral / Chronic Care Management: CRR required this visit?  No   CCM required this visit?  Appt scheduled with PCP     Plan:     I have personally reviewed and noted the following in the patient's chart:   Medical and social history Use of alcohol, tobacco or illicit drugs  Current medications and supplements including opioid prescriptions. Patient is not currently taking opioid prescriptions. Functional ability and status Nutritional status Physical activity Advanced directives List of other physicians Hospitalizations, surgeries, and ER visits in previous 12 months Vitals Screenings to include cognitive, depression, and falls Referrals and appointments  In addition, I have reviewed and discussed with patient certain preventive protocols, quality metrics, and best practice recommendations. A written personalized care plan for preventive services as well as general preventive health recommendations were provided to patient.  Sharee Holster, NP   09/04/2022   After Visit Summary: (MyChart) Due to this being a telephonic visit, the after visit summary with patients personalized plan was offered to patient via MyChart   Nurse Notes: this exam was performed by myself at this facility

## 2022-09-04 NOTE — Patient Instructions (Signed)
   Ms. Keen , Thank you for taking time to come for your Medicare Wellness Visit. I appreciate your ongoing commitment to your health goals. Please review the following plan we discussed and let me know if I can assist you in the future.   These are the goals we discussed:  Goals      DIET - INCREASE WATER INTAKE     Follow up with Primary Care Provider     General - Client will not be readmitted within 30 days ([facility])        This is a list of the screening recommended for you and due dates:  Health Maintenance  Topic Date Due   COVID-19 Vaccine (8 - 2023-24 season) 01/23/2022   Medicare Annual Wellness Visit  08/28/2022   Flu Shot  09/05/2022   DTaP/Tdap/Td vaccine (4 - Td or Tdap) 04/13/2031   Pneumonia Vaccine  Completed   DEXA scan (bone density measurement)  Completed   Zoster (Shingles) Vaccine  Completed   HPV Vaccine  Aged Out

## 2022-09-25 ENCOUNTER — Encounter: Payer: Self-pay | Admitting: Adult Health

## 2022-09-25 ENCOUNTER — Non-Acute Institutional Stay (SKILLED_NURSING_FACILITY): Payer: Medicare Other | Admitting: Adult Health

## 2022-09-25 DIAGNOSIS — I700 Atherosclerosis of aorta: Secondary | ICD-10-CM

## 2022-09-25 DIAGNOSIS — F339 Major depressive disorder, recurrent, unspecified: Secondary | ICD-10-CM

## 2022-09-25 DIAGNOSIS — J9611 Chronic respiratory failure with hypoxia: Secondary | ICD-10-CM | POA: Diagnosis not present

## 2022-09-25 NOTE — Progress Notes (Unsigned)
Location:  [facility] Room Number: 114 Place of Service:  SNF (31)   CODE STATUS: dnr   Allergies  Allergen Reactions   Hydrocodone-Acetaminophen Nausea And Vomiting    Chief Complaint  Patient presents with   Medical Management of Chronic Issues            Aortic atherosclerosis         Major depression chronic:     Chronic respiratory failure with hypoxia:     HPI:  She is a 89 year old long term resident of this facility being seen for the management of her chronic illnesses:  Aortic atherosclerosis         Major depression chronic:     Chronic respiratory failure with hypoxia:. Her current weight is 117 pounds. There are no reports of depressive or anxiety thoughts.   Past Medical History:  Diagnosis Date   Anxiety    Dementia (HCC)    GERD (gastroesophageal reflux disease)    Goiter    Hypertension    Major depression, recurrent, chronic (HCC) 02/22/2019   Osteoarthritis    Osteoporosis    Vitamin D insufficiency     Past Surgical History:  Procedure Laterality Date   APPENDECTOMY     KIDNEY STONE SURGERY     ORIF HIP FRACTURE Right 11/20/2012   Procedure: OPEN REDUCTION INTERNAL FIXATION RIGHT HIP;  Surgeon: Darreld Mclean, MD;  Location: [facility];  Service: Orthopedics;  Laterality: Right;   stress fractures of legs     TUBAL LIGATION      Social History   Socioeconomic History   Marital status: Widowed    Spouse name: Not on file   Number of children: Not on file   Years of education: Not on file   Highest education level: Not on file  Occupational History   Occupation: retired  Tobacco Use   Smoking status: Never   Smokeless tobacco: Never  Vaping Use   Vaping status: Never Used  Substance and Sexual Activity   Alcohol use: No   Drug use: No   Sexual activity: Not Currently  Other Topics Concern   Not on file  Social History Narrative   Long term resident of [facility]    Social Determinants of Health   Financial  Resource Strain: Not on file  Food Insecurity: Not on file  Transportation Needs: Not on file  Physical Activity: Not on file  Stress: Not on file  Social Connections: Not on file  Intimate Partner Violence: Not on file   Family History  Problem Relation Age of Onset   Heart attack Father    Heart disease Neg Hx       VITAL SIGNS BP 122/78   Pulse 76   Temp (!) 97.2 F (36.2 C)   Resp 20   Ht 5' (1.524 m)   Wt 117 lb 9.6 oz (53.3 kg)   SpO2 97%   BMI 22.97 kg/m   Outpatient Encounter Medications as of 09/25/2022  Medication Sig   acetaminophen (TYLENOL) 325 MG tablet Take 650 mg by mouth 2 (two) times daily. 10 am and 10 pm   Balsam [place]-Castor Oil (VENELEX) OINT Apply 1 Application topically.   diclofenac Sodium (VOLTAREN) 1 % GEL Apply topically 2 (two) times daily. Special Instructions: Apply to right knee bid d/t discomfort (Patient not taking: Reported on 08/09/2022)   ipratropium-albuterol (DUONEB) 0.5-2.5 (3) MG/3ML SOLN Take 3 mLs by nebulization every 6 (six) hours. (Patient not  taking: Reported on 08/09/2022)   losartan (COZAAR) 25 MG tablet Take 25 mg by mouth daily.   methimazole (TAPAZOLE) 5 MG tablet Take 5 mg by mouth daily.   methocarbamol (ROBAXIN) 500 MG tablet Take 0.5 tablets (250 mg total) by mouth every 8 (eight) hours as needed for muscle spasms.   mirtazapine (REMERON) 7.5 MG tablet Take 7.5 mg by mouth at bedtime.   NON FORMULARY Diet:Dysphagia 3 (mechanical soft), thin liquids. OK to have soft sandwiches per LPN request.   Nutritional Supplements (ENSURE ENLIVE PO) Take 1 Can by mouth daily.   omeprazole (PRILOSEC OTC) 20 MG tablet Take 20 mg by mouth daily.   OXYGEN Inhale 2 L into the lungs continuous.   No facility-administered encounter medications on file as of 09/25/2022.     SIGNIFICANT DIAGNOSTIC EXAMS  PREVIOUS  09-20-21: dexa: t score: -5.527  05-31-22: right knee x-ray:  No definite radiographic evidence of acuate fracture or  dislocation Mild joint effusion Mild degree of osteopenia Moderate osteoarthritis  05-31-22: right hip x-ray:  No radiographic evidence of acute fracture or dislocation S/p right ORIF with intramedullary rod and screw fixation of a healed intertrochanteric fracture with neal anatomic alignment.     06-10-22: right lower extremity venous doppler No DVT right lower extremity   NO NEW EXAMS    LABS REVIEWED PREVIOUS     01-10-22: wbc 7.2; hgb 11.9; hct 37.6; mcv 994.2 plt 201; glucose 100; bun 16; creat 0.65; k+ 3.5; na++ 141; ca 9.0; protein 6.4; albumin 3.1 gfr >60;  vitamin D 42.24 05-09-22: wbc 7.3; hgb 11.8; hct 38.2 plt 95.5 plt 181; glucose 120; bun 21; creat 0.73; k+ 4.3; na++ 141; ca 9.0; gfr >60; protein 7.2 albumin 3.1 tsh 0.221 vitamin B 12: 314 mag 2.5 06-20-22: protein 6.6; albumin 3.0  TODAY  08-27-22: tsh 2.590 free t4: 0.81 free t3: 2.5    Review of Systems  Unable to perform ROS: Dementia    Physical Exam Constitutional:      General: She is not in acute distress.    Appearance: She is well-developed. She is not diaphoretic.  Neck:     Thyroid: Thyroid mass and thyromegaly present.  Cardiovascular:     Rate and Rhythm: Normal rate and regular rhythm.     Pulses: Normal pulses.     Heart sounds: Normal heart sounds.  Pulmonary:     Effort: Pulmonary effort is normal. No respiratory distress.     Breath sounds: Normal breath sounds.  Abdominal:     General: Bowel sounds are normal. There is no distension.     Palpations: Abdomen is soft.     Tenderness: There is no abdominal tenderness.  Musculoskeletal:        General: Normal range of motion.     Cervical back: Neck supple.     Right lower leg: No edema.     Left lower leg: No edema.  Lymphadenopathy:     Cervical: No cervical adenopathy.  Skin:    General: Skin is warm and dry.  Neurological:     Mental Status: She is alert. Mental status is at baseline.  Psychiatric:        Mood and Affect: Mood  normal.      ASSESSMENT/ PLAN:  TODAY  Aortic atherosclerosis (ct 02-17-19) not on statin due to advanced age  68. Major depression chronic: is on remeron 7.5 mg nightly has failed 2 weans; she is losing weight  3. Chronic respiratory failure with  hypoxia: is currently on room air.   PREVIOUS   4. Angina pectoris unspecified no reports of chest pain  5. Primary osteoarthritis of multiple joints: will continue voltaren gel 2 gm to right knee twice daily and tylenol 650 mg twice daily   6. GERD without esophagitis: has history of GI bleed; will continue prilosec 20 mg daily  7. Vascular dementia without behavioral disturbance: weight is 99 pounds; is off remeron; will continue remeron 7.5 mg nightly to maintain her appetite  8. Essential hypertension: b/p 122/78: will continue cozaar 25 mg daily   9. Post menopausal osteoporosis t score -5.527 is on supplements.   10. Goiter: tsh 2.590 free t4: 0.81  11. Normochromic anemia hgb 11.8; will monitor   12. Severe protein calories malnutrition: albumin 3.0    [name] NP [facility]   call [phone]

## 2022-10-24 ENCOUNTER — Encounter: Payer: Self-pay | Admitting: Adult Health

## 2022-10-24 ENCOUNTER — Non-Acute Institutional Stay (SKILLED_NURSING_FACILITY): Payer: Medicare Other | Admitting: Adult Health

## 2022-10-24 DIAGNOSIS — I209 Angina pectoris, unspecified: Secondary | ICD-10-CM | POA: Diagnosis not present

## 2022-10-24 DIAGNOSIS — M159 Polyosteoarthritis, unspecified: Secondary | ICD-10-CM | POA: Diagnosis not present

## 2022-10-24 DIAGNOSIS — K219 Gastro-esophageal reflux disease without esophagitis: Secondary | ICD-10-CM

## 2022-10-24 NOTE — Progress Notes (Signed)
Location:  [facility] Room Number: 114W Place of Service:  SNF (31)   CODE STATUS: DNR  Allergies  Allergen Reactions   Hydrocodone-Acetaminophen Nausea And Vomiting    Chief Complaint  Patient presents with   Medical Management of Chronic Issues             Angina pectoris unspecified:       Primary osteoarthritis of multiple joints:    GERD without esophagitis:     HPI:  She is a 89 year old long term resident of this facility being seen for the management of her chronic illnesses: Angina pectoris unspecified:       Primary osteoarthritis of multiple joints:    GERD without esophagitis. She is having constipation. There are no reports of uncontrolled pain. Her weight is without significant change   Past Medical History:  Diagnosis Date   Anxiety    Dementia (HCC)    GERD (gastroesophageal reflux disease)    Goiter    Hypertension    Major depression, recurrent, chronic (HCC) 02/22/2019   Osteoarthritis    Osteoporosis    Vitamin D insufficiency     Past Surgical History:  Procedure Laterality Date   APPENDECTOMY     KIDNEY STONE SURGERY     ORIF HIP FRACTURE Right 11/20/2012   Procedure: OPEN REDUCTION INTERNAL FIXATION RIGHT HIP;  Surgeon: Darreld Mclean, MD;  Location: [facility];  Service: Orthopedics;  Laterality: Right;   stress fractures of legs     TUBAL LIGATION      Social History   Socioeconomic History   Marital status: Widowed    Spouse name: Not on file   Number of children: Not on file   Years of education: Not on file   Highest education level: Not on file  Occupational History   Occupation: retired  Tobacco Use   Smoking status: Never   Smokeless tobacco: Never  Vaping Use   Vaping status: Never Used  Substance and Sexual Activity   Alcohol use: No   Drug use: No   Sexual activity: Not Currently  Other Topics Concern   Not on file  Social History Narrative   Long term resident of [facility]    Social Determinants of  Health   Financial Resource Strain: Not on file  Food Insecurity: Not on file  Transportation Needs: Not on file  Physical Activity: Not on file  Stress: Not on file  Social Connections: Not on file  Intimate Partner Violence: Not on file   Family History  Problem Relation Age of Onset   Heart attack Father    Heart disease Neg Hx       VITAL SIGNS BP 118/66   Pulse 75   Temp (!) 97.2 F (36.2 C)   Resp 20   Ht 5' (1.524 m)   Wt 115 lb 6.4 oz (52.3 kg)   SpO2 90%   BMI 22.54 kg/m   Outpatient Encounter Medications as of 10/24/2022  Medication Sig   acetaminophen (TYLENOL) 325 MG tablet Take 650 mg by mouth 2 (two) times daily. 10 am and 10 pm   Balsam [place]-Castor Oil (VENELEX) OINT Apply 1 Application topically.   losartan (COZAAR) 25 MG tablet Take 25 mg by mouth daily.   methimazole (TAPAZOLE) 5 MG tablet Take 5 mg by mouth daily.   methocarbamol (ROBAXIN) 500 MG tablet Take 0.5 tablets (250 mg total) by mouth every 8 (eight) hours as needed for muscle spasms.  mirtazapine (REMERON) 7.5 MG tablet Take 7.5 mg by mouth at bedtime.   NON FORMULARY Diet:Dysphagia 3 (mechanical soft), thin liquids. OK to have soft sandwiches per LPN request.   Nutritional Supplements (ENSURE ENLIVE PO) Take 1 Can by mouth daily.   omeprazole (PRILOSEC OTC) 20 MG tablet Take 20 mg by mouth daily.   OXYGEN Inhale 2 L into the lungs continuous.   [DISCONTINUED] diclofenac Sodium (VOLTAREN) 1 % GEL Apply 2 g topically 2 (two) times daily. Special Instructions: Apply to right knee bid d/t discomfort   [DISCONTINUED] ipratropium-albuterol (DUONEB) 0.5-2.5 (3) MG/3ML SOLN Take 3 mLs by nebulization every 6 (six) hours.   No facility-administered encounter medications on file as of 10/24/2022.     SIGNIFICANT DIAGNOSTIC EXAMS  PREVIOUS  09-20-21: dexa: t score: -5.527  05-31-22: right knee x-ray:  No definite radiographic evidence of acuate fracture or dislocation Mild joint effusion Mild  degree of osteopenia Moderate osteoarthritis  05-31-22: right hip x-ray:  No radiographic evidence of acute fracture or dislocation S/p right ORIF with intramedullary rod and screw fixation of a healed intertrochanteric fracture with neal anatomic alignment.     06-10-22: right lower extremity venous doppler No DVT right lower extremity   NO NEW EXAMS    LABS REVIEWED PREVIOUS     01-10-22: wbc 7.2; hgb 11.9; hct 37.6; mcv 994.2 plt 201; glucose 100; bun 16; creat 0.65; k+ 3.5; na++ 141; ca 9.0; protein 6.4; albumin 3.1 gfr >60;  vitamin D 42.24 05-09-22: wbc 7.3; hgb 11.8; hct 38.2 plt 95.5 plt 181; glucose 120; bun 21; creat 0.73; k+ 4.3; na++ 141; ca 9.0; gfr >60; protein 7.2 albumin 3.1 tsh 0.221 vitamin B 12: 314 mag 2.5 06-20-22: protein 6.6; albumin 3.0 08-27-22: tsh 2.590 free t4: 0.81 free t3: 2.5   NO NEW LABS.    Review of Systems  Unable to perform ROS: Dementia   Physical Exam Constitutional:      General: She is not in acute distress.    Appearance: She is well-developed. She is not diaphoretic.  Neck:     Thyroid: Thyroid mass and thyromegaly present.  Cardiovascular:     Rate and Rhythm: Normal rate and regular rhythm.     Pulses: Normal pulses.     Heart sounds: Normal heart sounds.  Pulmonary:     Effort: Pulmonary effort is normal. No respiratory distress.     Breath sounds: Normal breath sounds.  Abdominal:     General: Bowel sounds are normal. There is no distension.     Palpations: Abdomen is soft.     Tenderness: There is no abdominal tenderness.  Musculoskeletal:        General: Normal range of motion.     Cervical back: Neck supple.     Right lower leg: No edema.     Left lower leg: No edema.  Lymphadenopathy:     Cervical: No cervical adenopathy.  Skin:    General: Skin is warm and dry.  Neurological:     Mental Status: She is alert. Mental status is at baseline.  Psychiatric:        Mood and Affect: Mood normal.      ASSESSMENT/  PLAN:  TODAY  Angina pectoris unspecified: no reports of chest pain present.   2. Primary osteoarthritis of multiple joints: is off voltaren gel and will continue tylenol 650 mg twice daily   3. GERD without esophagitis: has history of GI bleed and will continue prilosec 20 mg daily  PREVIOUS   4. Vascular dementia without behavioral disturbance: weight is 115 pounds; will continue remeron 7.5 mg nightly to maintain her appetite  5. Essential hypertension: b/p 118/66: will continue cozaar 25 mg daily   6. Post menopausal osteoporosis t score -5.527 is on supplements.   7. Goiter: tsh 2.590 free t4: 0.81  8. Normochromic anemia hgb 11.8; will monitor   9. Severe protein calories malnutrition: albumin 3.0  10. Chronic constipation: will begin senna s nightly    [name] NP [facility]  call [phone]

## 2022-10-25 ENCOUNTER — Encounter: Payer: Self-pay | Admitting: Adult Health

## 2022-10-25 ENCOUNTER — Non-Acute Institutional Stay (SKILLED_NURSING_FACILITY): Payer: Medicare Other | Admitting: Adult Health

## 2022-10-25 DIAGNOSIS — J9611 Chronic respiratory failure with hypoxia: Secondary | ICD-10-CM

## 2022-10-25 DIAGNOSIS — I700 Atherosclerosis of aorta: Secondary | ICD-10-CM | POA: Diagnosis not present

## 2022-10-25 DIAGNOSIS — F339 Major depressive disorder, recurrent, unspecified: Secondary | ICD-10-CM

## 2022-10-25 NOTE — Progress Notes (Signed)
Location:  [facility] Room Number: North/114/W Place of Service:  SNF (31)   CODE STATUS: dnr  Allergies  Allergen Reactions   Hydrocodone-Acetaminophen Nausea And Vomiting    Chief Complaint  Patient presents with   Acute Visit    Care Plan Meeting    HPI:  We have come together for her care plan meeting. BIMS 5/15 mood 3/30: decreased energy. She is using wheelchair with one fall without injury. She is dependent  assist with her adls. She is incontinent of bladder and bowel. Dietary: Diet is D3 with thin liquids; weight is 115.4 pounds; she requires setup for her meals. Therapy: none at this time. Activities: does attend. She continues to be followed for her chronic illnesses including:   Aortic atherosclerosis  Chronic respiratory failure with hypoxia   Major depression recurrent, chronic  Past Medical History:  Diagnosis Date   Anxiety    Dementia (HCC)    GERD (gastroesophageal reflux disease)    Goiter    Hypertension    Major depression, recurrent, chronic (HCC) 02/22/2019   Osteoarthritis    Osteoporosis    Vitamin D insufficiency     Past Surgical History:  Procedure Laterality Date   APPENDECTOMY     KIDNEY STONE SURGERY     ORIF HIP FRACTURE Right 11/20/2012   Procedure: OPEN REDUCTION INTERNAL FIXATION RIGHT HIP;  Surgeon: Darreld Mclean, MD;  Location: [facility];  Service: Orthopedics;  Laterality: Right;   stress fractures of legs     TUBAL LIGATION      Social History   Socioeconomic History   Marital status: Widowed    Spouse name: Not on file   Number of children: Not on file   Years of education: Not on file   Highest education level: Not on file  Occupational History   Occupation: retired  Tobacco Use   Smoking status: Never   Smokeless tobacco: Never  Vaping Use   Vaping status: Never Used  Substance and Sexual Activity   Alcohol use: No   Drug use: No   Sexual activity: Not Currently  Other Topics Concern   Not  on file  Social History Narrative   Long term resident of [facility]    Social Determinants of Health   Financial Resource Strain: Not on file  Food Insecurity: Not on file  Transportation Needs: Not on file  Physical Activity: Not on file  Stress: Not on file  Social Connections: Not on file  Intimate Partner Violence: Not on file   Family History  Problem Relation Age of Onset   Heart attack Father    Heart disease Neg Hx       VITAL SIGNS BP 118/66   Pulse 75   Temp (!) 97.2 F (36.2 C)   Resp 20   Ht 5' (1.524 m)   Wt 115 lb 6.4 oz (52.3 kg)   SpO2 92%   BMI 22.54 kg/m   Outpatient Encounter Medications as of 10/25/2022  Medication Sig   acetaminophen (TYLENOL) 325 MG tablet Take 650 mg by mouth 2 (two) times daily. 10 am and 10 pm   Balsam [place]-Castor Oil (VENELEX) OINT Apply 1 Application topically.   losartan (COZAAR) 25 MG tablet Take 25 mg by mouth daily.   methimazole (TAPAZOLE) 5 MG tablet Take 5 mg by mouth daily.   methocarbamol (ROBAXIN) 500 MG tablet Take 0.5 tablets (250 mg total) by mouth every 8 (eight) hours as needed for muscle spasms.  mirtazapine (REMERON) 7.5 MG tablet Take 7.5 mg by mouth at bedtime.   NON FORMULARY Diet:Dysphagia 3 (mechanical soft), thin liquids. OK to have soft sandwiches per LPN request.   Nutritional Supplements (ENSURE ENLIVE PO) Take 1 Can by mouth daily.   omeprazole (PRILOSEC OTC) 20 MG tablet Take 20 mg by mouth daily.   OXYGEN Inhale 2 L into the lungs continuous.   sennosides-docusate sodium (SENOKOT-S) 8.6-50 MG tablet Take 1 tablet by mouth daily.   No facility-administered encounter medications on file as of 10/25/2022.     SIGNIFICANT DIAGNOSTIC EXAMS  PREVIOUS  09-20-21: dexa: t score: -5.527  05-31-22: right knee x-ray:  No definite radiographic evidence of acuate fracture or dislocation Mild joint effusion Mild degree of osteopenia Moderate osteoarthritis  05-31-22: right hip x-ray:  No radiographic  evidence of acute fracture or dislocation S/p right ORIF with intramedullary rod and screw fixation of a healed intertrochanteric fracture with neal anatomic alignment.     06-10-22: right lower extremity venous doppler No DVT right lower extremity   NO NEW EXAMS    LABS REVIEWED PREVIOUS     01-10-22: wbc 7.2; hgb 11.9; hct 37.6; mcv 994.2 plt 201; glucose 100; bun 16; creat 0.65; k+ 3.5; na++ 141; ca 9.0; protein 6.4; albumin 3.1 gfr >60;  vitamin D 42.24 05-09-22: wbc 7.3; hgb 11.8; hct 38.2 plt 95.5 plt 181; glucose 120; bun 21; creat 0.73; k+ 4.3; na++ 141; ca 9.0; gfr >60; protein 7.2 albumin 3.1 tsh 0.221 vitamin B 12: 314 mag 2.5 06-20-22: protein 6.6; albumin 3.0 08-27-22: tsh 2.590 free t4: 0.81 free t3: 2.5   NO NEW LABS.   Review of Systems  Unable to perform ROS: Dementia    Physical Exam Constitutional:      General: She is not in acute distress.    Appearance: She is well-developed. She is not diaphoretic.  Neck:     Thyroid: Thyroid mass and thyromegaly present.  Cardiovascular:     Rate and Rhythm: Normal rate and regular rhythm.     Pulses: Normal pulses.     Heart sounds: Normal heart sounds.  Pulmonary:     Effort: Pulmonary effort is normal. No respiratory distress.     Breath sounds: Normal breath sounds.  Abdominal:     General: Bowel sounds are normal. There is no distension.     Palpations: Abdomen is soft.     Tenderness: There is no abdominal tenderness.  Musculoskeletal:        General: Normal range of motion.     Cervical back: Neck supple.     Right lower leg: No edema.     Left lower leg: No edema.  Lymphadenopathy:     Cervical: No cervical adenopathy.  Skin:    General: Skin is warm and dry.  Neurological:     Mental Status: She is alert. Mental status is at baseline.  Psychiatric:        Mood and Affect: Mood normal.       ASSESSMENT/ PLAN:  TODAY  Aortic atherosclerosis Chronic respiratory failure with hypoxia Major depression  recurrent, chronic  Will continue current medications Will continue current plan of care Will continue to monitor her status.   Time spent with patient: 40 minutes: medications; plan of care; dietary    [name] NP [facility]   call [phone]

## 2022-11-06 ENCOUNTER — Non-Acute Institutional Stay (SKILLED_NURSING_FACILITY): Payer: Medicare Other | Admitting: Internal Medicine

## 2022-11-06 ENCOUNTER — Encounter: Payer: Self-pay | Admitting: Internal Medicine

## 2022-11-06 DIAGNOSIS — E876 Hypokalemia: Secondary | ICD-10-CM | POA: Diagnosis not present

## 2022-11-06 DIAGNOSIS — E0590 Thyrotoxicosis, unspecified without thyrotoxic crisis or storm: Secondary | ICD-10-CM | POA: Diagnosis not present

## 2022-11-06 DIAGNOSIS — D649 Anemia, unspecified: Secondary | ICD-10-CM | POA: Diagnosis not present

## 2022-11-06 DIAGNOSIS — I10 Essential (primary) hypertension: Secondary | ICD-10-CM

## 2022-11-06 NOTE — Assessment & Plan Note (Signed)
Current TFTs are therapeutic. Continue monitor as on Methimazole.

## 2022-11-06 NOTE — Progress Notes (Signed)
   NURSING HOME LOCATION:  [facility] ROOM NUMBER:  114 W  CODE STATUS:  DNR  PCP:  [name] NP  This is a nursing facility follow up visit of chronic medical diagnoses & to document compliance with Regulation 483.30 (c) in The Long Term Care Survey Manual Phase 2 which mandates caregiver visit ( visits can alternate among physician, PA or NP as per statutes) within 10 days of 30 days / 60 days/ 90 days post admission to SNF date    Interim medical record and care since last SNF visit was updated with review of diagnostic studies and change in clinical status since last visit were documented.  HPI: She is a permanent resident of facility with medical diagnoses of GERD, thyroid goiter, essential hypertension, osteoporosis, vitamin D deficiency, history of nephrolithiasis, major depression/recurrent, and dementia. Most recent labs were performed 08/27/22 and revealed therapeutic TFTs. Serially present has been hypokalemia with range from  3.0-3.1. Protein / caloric malnutrition suspect with albumin 3.0 & total protein of 6.6. Chronic anemia is also stable with current H/H of 11.8/38.2.  Review of systems: Dementia invalidated responses.  She stated that she was doing "pretty good."  When I asked about any other symptoms she mentioned some back pain.  When I asked at what level she said "I think the middle".  She went on to say "I keep going" and then laughed.  She confabulated about "the maid gave me a permanent. " When I asked about dysphagia her nonsensical answer was "I do have a couple."  Physical exam:  Pertinent or positive findings: She appears her age and chronically ill.  Her countenance was 1 of being puzzled.  She is wearing nasal oxygen.  She has only 2 upper remaining teeth.  The lower mandibular teeth are malaligned.  There is a large asymmetric goiter .Heart sounds are markedly distant.  She has marked kyphosis of the posterior thorax.  There are low-grade rales  at the bases, greater on the right.  Breath sounds are decreased superiorly.  Pedal pulses are decreased.  When I asked her to hold her hands up to assess arthritic changes; she could not complete the task.  General appearance: no acute distress, increased work of breathing is present.   Lymphatic: No lymphadenopathy about the head, neck, axilla. Eyes: No conjunctival inflammation or lid edema is present. There is no scleral icterus. Ears:  External ear exam shows no significant lesions or deformities.   Nose:  External nasal examination shows no deformity or inflammation. Nasal mucosa are pink and moist without lesions, exudates Neck:  No tenderness noted.    Heart:  No gallop, murmur, click, rub .  Lungs: without wheezes, rhonchi, rubs. Abdomen: Bowel sounds are normal. Abdomen is soft and nontender with no organomegaly, hernias, masses. GU: Deferred  Extremities:  No cyanosis, clubbing, edema  Neurologic exam :Balance, Rhomberg, finger to nose testing could not be completed due to clinical state Skin: Warm & dry w/o tenting. No significant lesions or rash.  See summary under each active problem in the Problem List with associated updated therapeutic plan

## 2022-11-06 NOTE — Patient Instructions (Signed)
See assessment and plan under each diagnosis in the problem list and acutely for this visit 

## 2022-11-06 NOTE — Assessment & Plan Note (Addendum)
BP controlled; no change in antihypertensive medications She is not on a thiazide diuretic to explain the low K+.

## 2022-11-06 NOTE — Assessment & Plan Note (Signed)
Serially stable w/o reported bleeding dyscrasias. Continue monitor.

## 2022-11-06 NOTE — Assessment & Plan Note (Signed)
Range 3.0-3.1 w/o appatent cause. Check fasting cortisol & replete K+.

## 2022-11-12 DIAGNOSIS — Z23 Encounter for immunization: Secondary | ICD-10-CM | POA: Diagnosis not present

## 2022-11-25 DIAGNOSIS — Z23 Encounter for immunization: Secondary | ICD-10-CM | POA: Diagnosis not present

## 2022-12-13 DIAGNOSIS — L603 Nail dystrophy: Secondary | ICD-10-CM | POA: Diagnosis not present

## 2022-12-13 DIAGNOSIS — I739 Peripheral vascular disease, unspecified: Secondary | ICD-10-CM | POA: Diagnosis not present

## 2022-12-13 DIAGNOSIS — L602 Onychogryphosis: Secondary | ICD-10-CM | POA: Diagnosis not present

## 2022-12-13 DIAGNOSIS — L84 Corns and callosities: Secondary | ICD-10-CM | POA: Diagnosis not present

## 2022-12-21 ENCOUNTER — Other Ambulatory Visit (HOSPITAL_COMMUNITY)
Admission: RE | Admit: 2022-12-21 | Discharge: 2022-12-21 | Disposition: A | Discharge status: Home / Self Care | Payer: Medicare Other | Source: Skilled Nursing Facility | Attending: Adult Health | Admitting: Adult Health

## 2022-12-21 DIAGNOSIS — Z79899 Other long term (current) drug therapy: Secondary | ICD-10-CM | POA: Insufficient documentation

## 2022-12-21 LAB — HEPATIC FUNCTION PANEL
ALT: 13 U/L (ref 0–44)
AST: 17 U/L (ref 15–41)
Albumin: 2.9 g/dL — ABNORMAL LOW (ref 3.5–5.0)
Alkaline Phosphatase: 56 U/L (ref 38–126)
Bilirubin, Direct: 0.1 mg/dL (ref 0.0–0.2)
Indirect Bilirubin: 0.4 mg/dL (ref 0.3–0.9)
Total Bilirubin: 0.5 mg/dL (ref <1.2)
Total Protein: 6.4 g/dL — ABNORMAL LOW (ref 6.5–8.1)

## 2022-12-25 ENCOUNTER — Non-Acute Institutional Stay (SKILLED_NURSING_FACILITY): Payer: Medicare Other | Admitting: Adult Health

## 2022-12-25 DIAGNOSIS — F01C0 Vascular dementia, severe, without behavioral disturbance, psychotic disturbance, mood disturbance, and anxiety: Secondary | ICD-10-CM

## 2022-12-25 DIAGNOSIS — M810 Age-related osteoporosis without current pathological fracture: Secondary | ICD-10-CM

## 2022-12-25 DIAGNOSIS — I10 Essential (primary) hypertension: Secondary | ICD-10-CM | POA: Diagnosis not present

## 2022-12-26 ENCOUNTER — Encounter: Payer: Self-pay | Admitting: Adult Health

## 2022-12-26 NOTE — Progress Notes (Signed)
Location:  [facility] Room Number: 118 Place of Service:  SNF (31)   CODE STATUS: dnr   Allergies  Allergen Reactions   Hydrocodone-Acetaminophen Nausea And Vomiting    Chief Complaint  Patient presents with   Medical Management of Chronic Issues          Vascular dementia without behavioral disturbance:   Essential hypertension: Post menopausal osteoporosis      HPI:  She is a 89 year old long term resident of this facility being seen for the management of her chronic illnesses: Vascular dementia without behavioral disturbance:   Essential hypertension: Post menopausal osteoporosis. She has lost a few pounds over the past month without significant change.  There are no reports of heart burn. She has been on ppi long term; will need to stop this medication. There are no reports of uncontrolled pain.   Past Medical History:  Diagnosis Date   Anxiety    Dementia (HCC)    GERD (gastroesophageal reflux disease)    Goiter    Hypertension    Major depression, recurrent, chronic (HCC) 02/22/2019   Osteoarthritis    Osteoporosis    Vitamin D insufficiency     Past Surgical History:  Procedure Laterality Date   APPENDECTOMY     KIDNEY STONE SURGERY     ORIF HIP FRACTURE Right 11/20/2012   Procedure: OPEN REDUCTION INTERNAL FIXATION RIGHT HIP;  Surgeon: Darreld Mclean, MD;  Location: [facility];  Service: Orthopedics;  Laterality: Right;   stress fractures of legs     TUBAL LIGATION      Social History   Socioeconomic History   Marital status: Widowed    Spouse name: Not on file   Number of children: Not on file   Years of education: Not on file   Highest education level: Not on file  Occupational History   Occupation: retired  Tobacco Use   Smoking status: Never   Smokeless tobacco: Never  Vaping Use   Vaping status: Never Used  Substance and Sexual Activity   Alcohol use: No   Drug use: No   Sexual activity: Not Currently  Other Topics  Concern   Not on file  Social History Narrative   Long term resident of [facility]    Social Determinants of Health   Financial Resource Strain: Not on file  Food Insecurity: Not on file  Transportation Needs: Not on file  Physical Activity: Not on file  Stress: Not on file  Social Connections: Not on file  Intimate Partner Violence: Not on file   Family History  Problem Relation Age of Onset   Heart attack Father    Heart disease Neg Hx       VITAL SIGNS BP (!) 96/56   Pulse 69   Temp 98.1 F (36.7 C)   Resp 20   Ht 5' (1.524 m)   Wt 113 lb 12.8 oz (51.6 kg)   SpO2 98%   BMI 22.23 kg/m   Outpatient Encounter Medications as of 12/25/2022  Medication Sig   acetaminophen (TYLENOL) 325 MG tablet Take 650 mg by mouth 2 (two) times daily. 10 am and 10 pm   Balsam [place]-Castor Oil (VENELEX) OINT Apply 1 Application topically.   losartan (COZAAR) 25 MG tablet Take 25 mg by mouth daily.   methimazole (TAPAZOLE) 5 MG tablet Take 5 mg by mouth daily.   methocarbamol (ROBAXIN) 500 MG tablet Take 0.5 tablets (250 mg total) by mouth every 8 (eight)  hours as needed for muscle spasms.   mirtazapine (REMERON) 7.5 MG tablet Take 7.5 mg by mouth at bedtime.   NON FORMULARY Diet:Dysphagia 3 (mechanical soft), thin liquids. OK to have soft sandwiches per LPN request.   Nutritional Supplements (ENSURE ENLIVE PO) Take 1 Can by mouth daily.   omeprazole (PRILOSEC OTC) 20 MG tablet Take 20 mg by mouth daily.   OXYGEN Inhale 2 L into the lungs continuous.   sennosides-docusate sodium (SENOKOT-S) 8.6-50 MG tablet Take 1 tablet by mouth daily.   No facility-administered encounter medications on file as of 12/25/2022.     SIGNIFICANT DIAGNOSTIC EXAMS  PREVIOUS  09-20-21: dexa: t score: -5.527  05-31-22: right knee x-ray:  No definite radiographic evidence of acuate fracture or dislocation Mild joint effusion Mild degree of osteopenia Moderate osteoarthritis  05-31-22: right hip x-ray:  No  radiographic evidence of acute fracture or dislocation S/p right ORIF with intramedullary rod and screw fixation of a healed intertrochanteric fracture with neal anatomic alignment.     06-10-22: right lower extremity venous doppler No DVT right lower extremity   NO NEW EXAMS    LABS REVIEWED PREVIOUS     01-10-22: wbc 7.2; hgb 11.9; hct 37.6; mcv 994.2 plt 201; glucose 100; bun 16; creat 0.65; k+ 3.5; na++ 141; ca 9.0; protein 6.4; albumin 3.1 gfr >60;  vitamin D 42.24 05-09-22: wbc 7.3; hgb 11.8; hct 38.2 plt 95.5 plt 181; glucose 120; bun 21; creat 0.73; k+ 4.3; na++ 141; ca 9.0; gfr >60; protein 7.2 albumin 3.1 tsh 0.221 vitamin B 12: 314 mag 2.5 06-20-22: protein 6.6; albumin 3.0 08-27-22: tsh 2.590 free t4: 0.81 free t3: 2.5   NO NEW LABS.    Review of Systems  Unable to perform ROS: Dementia   Physical Exam Constitutional:      General: She is not in acute distress.    Appearance: She is underweight. She is not diaphoretic.  Neck:     Thyroid: Thyroid mass and thyromegaly present.  Cardiovascular:     Rate and Rhythm: Normal rate and regular rhythm.     Pulses: Normal pulses.     Heart sounds: Normal heart sounds.  Pulmonary:     Effort: Pulmonary effort is normal. No respiratory distress.     Breath sounds: Normal breath sounds.  Abdominal:     General: Bowel sounds are normal. There is no distension.     Palpations: Abdomen is soft.     Tenderness: There is no abdominal tenderness.  Musculoskeletal:        General: Normal range of motion.     Cervical back: Neck supple.     Right lower leg: No edema.     Left lower leg: No edema.  Lymphadenopathy:     Cervical: No cervical adenopathy.  Skin:    General: Skin is warm and dry.  Neurological:     Mental Status: She is alert. Mental status is at baseline.  Psychiatric:        Mood and Affect: Mood normal.      ASSESSMENT/ PLAN:  TODAY  Vascular dementia without behavioral disturbance: weight is 113 pounds; will  continue remeron 7.5 mg nightly to help maintain her appetite; without this medication she does not eat  2. Essential hypertension: b/p 96/56 will stop cozaar   3. Post menopausal osteoporosis t score -5.527 is on supplements.   PREVIOUS   4. Goiter: tsh 2.590 free t4: 0.81  5. Normochromic anemia hgb 11.8; will monitor  6. Severe protein calories malnutrition: albumin 3.0  7. Chronic constipation: will continue senna s nightly   8. Angina pectoris unspecified: no reports of chest pain present.   9. Primary osteoarthritis of multiple joints: is off voltaren gel and will continue tylenol 650 mg twice daily   10. GERD without esophagitis: has history of GI bleed  will stop prilosec at this time.     [name] NP [facility]  call [phone]

## 2023-01-17 ENCOUNTER — Non-Acute Institutional Stay (SKILLED_NURSING_FACILITY): Payer: Self-pay | Admitting: Adult Health

## 2023-01-17 ENCOUNTER — Encounter: Payer: Self-pay | Admitting: Adult Health

## 2023-01-17 DIAGNOSIS — E440 Moderate protein-calorie malnutrition: Secondary | ICD-10-CM | POA: Diagnosis not present

## 2023-01-17 DIAGNOSIS — J9611 Chronic respiratory failure with hypoxia: Secondary | ICD-10-CM

## 2023-01-17 DIAGNOSIS — I700 Atherosclerosis of aorta: Secondary | ICD-10-CM

## 2023-01-17 NOTE — Progress Notes (Signed)
Location:  [facility] Room Number: 114 Place of Service:  SNF (31)   CODE STATUS: dnr   Allergies  Allergen Reactions   Hydrocodone-Acetaminophen Nausea And Vomiting    Chief Complaint  Patient presents with   Acute Visit    Care plan meeting     HPI:  We have come together for her care plan meeting. BIMS 3/15 mood 0/30. She is wheelchair bound without falls. She requires dependent assist with her adls. She is incontinent of bladder and bowel. Dietary: D3 with sandwiches; weight is 113.8 pounds. Requires setup for meals. Therapy: none at this time. Activities: music pets tv. She will continue to be followed for her chronic illnesses including:  Aortic atherosclerosis   Chronic respiratory failure with hypoxia   Moderate protein calorie malnutrition  Past Medical History:  Diagnosis Date   Anxiety    Dementia (HCC)    GERD (gastroesophageal reflux disease)    Goiter    Hypertension    Major depression, recurrent, chronic (HCC) 02/22/2019   Osteoarthritis    Osteoporosis    Vitamin D insufficiency     Past Surgical History:  Procedure Laterality Date   APPENDECTOMY     KIDNEY STONE SURGERY     ORIF HIP FRACTURE Right 11/20/2012   Procedure: OPEN REDUCTION INTERNAL FIXATION RIGHT HIP;  Surgeon: Darreld Mclean, MD;  Location: [facility];  Service: Orthopedics;  Laterality: Right;   stress fractures of legs     TUBAL LIGATION      Social History   Socioeconomic History   Marital status: Widowed    Spouse name: Not on file   Number of children: Not on file   Years of education: Not on file   Highest education level: Not on file  Occupational History   Occupation: retired  Tobacco Use   Smoking status: Never   Smokeless tobacco: Never  Vaping Use   Vaping status: Never Used  Substance and Sexual Activity   Alcohol use: No   Drug use: No   Sexual activity: Not Currently  Other Topics Concern   Not on file  Social History Narrative   Long  term resident of [facility]    Social Drivers of Health   Financial Resource Strain: Not on file  Food Insecurity: Not on file  Transportation Needs: Not on file  Physical Activity: Not on file  Stress: Not on file  Social Connections: Not on file  Intimate Partner Violence: Not on file   Family History  Problem Relation Age of Onset   Heart attack Father    Heart disease Neg Hx       VITAL SIGNS BP 120/80   Pulse 74   Temp 98.6 F (37 C)   Resp 20   Ht 4' 10.4" (1.483 m)   Wt 114 lb (51.7 kg)   SpO2 95%   BMI 23.50 kg/m   Outpatient Encounter Medications as of 01/17/2023  Medication Sig   acetaminophen (TYLENOL) 325 MG tablet Take 650 mg by mouth 2 (two) times daily. 10 am and 10 pm   Balsam [place]-Castor Oil (VENELEX) OINT Apply 1 Application topically.   methimazole (TAPAZOLE) 5 MG tablet Take 5 mg by mouth daily.   methocarbamol (ROBAXIN) 500 MG tablet Take 0.5 tablets (250 mg total) by mouth every 8 (eight) hours as needed for muscle spasms.   mirtazapine (REMERON) 7.5 MG tablet Take 7.5 mg by mouth at bedtime.   NON FORMULARY Diet:Dysphagia 3 (mechanical soft),  thin liquids. OK to have soft sandwiches per LPN request.   Nutritional Supplements (ENSURE ENLIVE PO) Take 1 Can by mouth daily.   OXYGEN Inhale 2 L into the lungs continuous.   sennosides-docusate sodium (SENOKOT-S) 8.6-50 MG tablet Take 1 tablet by mouth daily.   No facility-administered encounter medications on file as of 01/17/2023.     SIGNIFICANT DIAGNOSTIC EXAMS  PREVIOUS  09-20-21: dexa: t score: -5.527  05-31-22: right knee x-ray:  No definite radiographic evidence of acuate fracture or dislocation Mild joint effusion Mild degree of osteopenia Moderate osteoarthritis  05-31-22: right hip x-ray:  No radiographic evidence of acute fracture or dislocation S/p right ORIF with intramedullary rod and screw fixation of a healed intertrochanteric fracture with neal anatomic alignment.     06-10-22:  right lower extremity venous doppler No DVT right lower extremity   NO NEW EXAMS    LABS REVIEWED PREVIOUS     01-10-22: wbc 7.2; hgb 11.9; hct 37.6; mcv 994.2 plt 201; glucose 100; bun 16; creat 0.65; k+ 3.5; na++ 141; ca 9.0; protein 6.4; albumin 3.1 gfr >60;  vitamin D 42.24 05-09-22: wbc 7.3; hgb 11.8; hct 38.2 plt 95.5 plt 181; glucose 120; bun 21; creat 0.73; k+ 4.3; na++ 141; ca 9.0; gfr >60; protein 7.2 albumin 3.1 tsh 0.221 vitamin B 12: 314 mag 2.5 06-20-22: protein 6.6; albumin 3.0 08-27-22: tsh 2.590 free t4: 0.81 free t3: 2.5   NO NEW LABS.     Review of Systems  Unable to perform ROS: Dementia   Physical Exam Constitutional:      General: She is not in acute distress.    Appearance: She is well-developed. She is not diaphoretic.  Neck:     Thyroid: Thyroid mass and thyromegaly present.  Cardiovascular:     Rate and Rhythm: Normal rate and regular rhythm.     Pulses: Normal pulses.     Heart sounds: Normal heart sounds.  Pulmonary:     Effort: Pulmonary effort is normal. No respiratory distress.     Breath sounds: Normal breath sounds.  Abdominal:     General: Bowel sounds are normal. There is no distension.     Palpations: Abdomen is soft.     Tenderness: There is no abdominal tenderness.  Musculoskeletal:        General: Normal range of motion.     Cervical back: Neck supple.     Right lower leg: No edema.     Left lower leg: No edema.  Lymphadenopathy:     Cervical: No cervical adenopathy.  Skin:    General: Skin is warm and dry.  Neurological:     Mental Status: She is alert. Mental status is at baseline.  Psychiatric:        Mood and Affect: Mood normal.     ASSESSMENT/ PLAN:  TODAY  Aortic atherosclerosis Chronic respiratory failure with hypoxia Moderate protein calorie malnutrition  Will continue current medications Will continue current plan of care Will continue to monitor her status.   Time spent with patient: 40 minutes: medications;  activities; dietary.    [name] NP [facility]   call [phone]

## 2023-01-22 ENCOUNTER — Non-Acute Institutional Stay (SKILLED_NURSING_FACILITY): Payer: Medicare Other | Admitting: Adult Health

## 2023-01-22 ENCOUNTER — Encounter: Payer: Self-pay | Admitting: Adult Health

## 2023-01-22 DIAGNOSIS — E440 Moderate protein-calorie malnutrition: Secondary | ICD-10-CM

## 2023-01-22 DIAGNOSIS — E049 Nontoxic goiter, unspecified: Secondary | ICD-10-CM

## 2023-01-22 DIAGNOSIS — D649 Anemia, unspecified: Secondary | ICD-10-CM | POA: Diagnosis not present

## 2023-01-22 NOTE — Progress Notes (Signed)
Location:  [facility] Room Number: 114 Place of Service:  SNF (31)   CODE STATUS: dnr  Allergies  Allergen Reactions   Hydrocodone-Acetaminophen Nausea And Vomiting    Chief Complaint  Patient presents with   Medical Management of Chronic Issues        Goiter:  Normochromic anemia:    Severe protein calorie malnutrition:     HPI:  She is a 89 year old long term resident of this facility being seen for the management of her chronic illnesses:  Goiter:  Normochromic anemia:    Severe protein calorie malnutrition. There are no reports of uncontrolled pain. Her weight is stable; she continues to need remeron to help maintain her appetite. She continues to get out of bed daily   Past Medical History:  Diagnosis Date   Anxiety    Dementia (HCC)    GERD (gastroesophageal reflux disease)    Goiter    Hypertension    Major depression, recurrent, chronic (HCC) 02/22/2019   Osteoarthritis    Osteoporosis    Vitamin D insufficiency     Past Surgical History:  Procedure Laterality Date   APPENDECTOMY     KIDNEY STONE SURGERY     ORIF HIP FRACTURE Right 11/20/2012   Procedure: OPEN REDUCTION INTERNAL FIXATION RIGHT HIP;  Surgeon: Darreld Mclean, MD;  Location: [facility];  Service: Orthopedics;  Laterality: Right;   stress fractures of legs     TUBAL LIGATION      Social History   Socioeconomic History   Marital status: Widowed    Spouse name: Not on file   Number of children: Not on file   Years of education: Not on file   Highest education level: Not on file  Occupational History   Occupation: retired  Tobacco Use   Smoking status: Never   Smokeless tobacco: Never  Vaping Use   Vaping status: Never Used  Substance and Sexual Activity   Alcohol use: No   Drug use: No   Sexual activity: Not Currently  Other Topics Concern   Not on file  Social History Narrative   Long term resident of [facility]    Social Drivers of Health   Financial Resource  Strain: Not on file  Food Insecurity: Not on file  Transportation Needs: Not on file  Physical Activity: Not on file  Stress: Not on file  Social Connections: Not on file  Intimate Partner Violence: Not on file   Family History  Problem Relation Age of Onset   Heart attack Father    Heart disease Neg Hx       VITAL SIGNS BP (!) 109/54   Pulse 70   Temp 97.9 F (36.6 C)   Resp 20   Ht 4' 10.5" (1.486 m)   Wt 114 lb (51.7 kg)   SpO2 98%   BMI 23.42 kg/m   Outpatient Encounter Medications as of 01/22/2023  Medication Sig   acetaminophen (TYLENOL) 325 MG tablet Take 650 mg by mouth 2 (two) times daily. 10 am and 10 pm   Balsam [place]-Castor Oil (VENELEX) OINT Apply 1 Application topically.   methimazole (TAPAZOLE) 5 MG tablet Take 5 mg by mouth daily.   methocarbamol (ROBAXIN) 500 MG tablet Take 0.5 tablets (250 mg total) by mouth every 8 (eight) hours as needed for muscle spasms.   mirtazapine (REMERON) 7.5 MG tablet Take 7.5 mg by mouth at bedtime.   NON FORMULARY Diet:Dysphagia 3 (mechanical soft), thin liquids. OK  to have soft sandwiches per LPN request.   Nutritional Supplements (ENSURE ENLIVE PO) Take 1 Can by mouth daily.   OXYGEN Inhale 2 L into the lungs continuous.   sennosides-docusate sodium (SENOKOT-S) 8.6-50 MG tablet Take 1 tablet by mouth daily.   No facility-administered encounter medications on file as of 01/22/2023.     SIGNIFICANT DIAGNOSTIC EXAMS   PREVIOUS  09-20-21: dexa: t score: -5.527  05-31-22: right knee x-ray:  No definite radiographic evidence of acuate fracture or dislocation Mild joint effusion Mild degree of osteopenia Moderate osteoarthritis  05-31-22: right hip x-ray:  No radiographic evidence of acute fracture or dislocation S/p right ORIF with intramedullary rod and screw fixation of a healed intertrochanteric fracture with neal anatomic alignment.     06-10-22: right lower extremity venous doppler No DVT right lower extremity    NO NEW EXAMS    LABS REVIEWED PREVIOUS     01-10-22: wbc 7.2; hgb 11.9; hct 37.6; mcv 994.2 plt 201; glucose 100; bun 16; creat 0.65; k+ 3.5; na++ 141; ca 9.0; protein 6.4; albumin 3.1 gfr >60;  vitamin D 42.24 05-09-22: wbc 7.3; hgb 11.8; hct 38.2 plt 95.5 plt 181; glucose 120; bun 21; creat 0.73; k+ 4.3; na++ 141; ca 9.0; gfr >60; protein 7.2 albumin 3.1 tsh 0.221 vitamin B 12: 314 mag 2.5 06-20-22: protein 6.6; albumin 3.0 08-27-22: tsh 2.590 free t4: 0.81 free t3: 2.5   TODAY  12-21-22: protein 6.1 albumin 2.1     Review of Systems  Unable to perform ROS: Dementia   Physical Exam Constitutional:      General: She is not in acute distress.    Appearance: She is well-developed. She is not diaphoretic.  Neck:     Thyroid: Thyroid mass and thyromegaly present.  Cardiovascular:     Rate and Rhythm: Normal rate and regular rhythm.     Pulses: Normal pulses.     Heart sounds: Normal heart sounds.  Pulmonary:     Effort: Pulmonary effort is normal. No respiratory distress.     Breath sounds: Normal breath sounds.  Abdominal:     General: Bowel sounds are normal. There is no distension.     Palpations: Abdomen is soft.     Tenderness: There is no abdominal tenderness.  Musculoskeletal:        General: Normal range of motion.     Cervical back: Neck supple.     Right lower leg: No edema.     Left lower leg: No edema.  Lymphadenopathy:     Cervical: No cervical adenopathy.  Skin:    General: Skin is warm and dry.  Neurological:     Mental Status: She is alert. Mental status is at baseline.  Psychiatric:        Mood and Affect: Mood normal.      ASSESSMENT/ PLAN:  TODAY  Goiter: tsh 2.590 free t4: 0.81  2. Normochromic anemia: hgb 11.8  3. Severe protein calorie malnutrition: albumin 2.9 will continue supplements as directed.   PREVIOUS   4. Chronic constipation: will continue senna s nightly   5. Angina pectoris unspecified: no reports of chest pain present.    6. Primary osteoarthritis of multiple joints: is off voltaren gel and will continue tylenol 650 mg twice daily   7. GERD without esophagitis: has history of GI bleed  is off PPI    8. Vascular dementia without behavioral disturbance: weight is 114 pounds; will continue remeron 7.5 mg nightly to help maintain her  appetite; without this medication she does not eat  9. Essential hypertension: b/p 109/54 is off ARB    10. Post menopausal osteoporosis t score -5.527 is on supplements.     [name] NP [facility]   call [phone]

## 2023-01-23 ENCOUNTER — Other Ambulatory Visit (HOSPITAL_COMMUNITY)
Admission: RE | Admit: 2023-01-23 | Discharge: 2023-01-23 | Disposition: A | Discharge status: Home / Self Care | Payer: Medicare Other | Source: Skilled Nursing Facility | Attending: Adult Health | Admitting: Adult Health

## 2023-01-23 DIAGNOSIS — I10 Essential (primary) hypertension: Secondary | ICD-10-CM | POA: Insufficient documentation

## 2023-01-23 LAB — CBC
HCT: 37.3 % (ref 36.0–46.0)
Hemoglobin: 11.7 g/dL — ABNORMAL LOW (ref 12.0–15.0)
MCH: 28.8 pg (ref 26.0–34.0)
MCHC: 31.4 g/dL (ref 30.0–36.0)
MCV: 91.9 fL (ref 80.0–100.0)
Platelets: 225 10*3/uL (ref 150–400)
RBC: 4.06 MIL/uL (ref 3.87–5.11)
RDW: 15.3 % (ref 11.5–15.5)
WBC: 5.6 10*3/uL (ref 4.0–10.5)
nRBC: 0 % (ref 0.0–0.2)

## 2023-01-23 LAB — COMPREHENSIVE METABOLIC PANEL
ALT: 14 U/L (ref 0–44)
AST: 18 U/L (ref 15–41)
Albumin: 3.2 g/dL — ABNORMAL LOW (ref 3.5–5.0)
Alkaline Phosphatase: 45 U/L (ref 38–126)
Anion gap: 4 — ABNORMAL LOW (ref 5–15)
BUN: 14 mg/dL (ref 8–23)
CO2: 25 mmol/L (ref 22–32)
Calcium: 8.7 mg/dL — ABNORMAL LOW (ref 8.9–10.3)
Chloride: 108 mmol/L (ref 98–111)
Creatinine, Ser: 0.61 mg/dL (ref 0.44–1.00)
GFR, Estimated: >60 mL/min (ref >60)
Glucose, Bld: 98 mg/dL (ref 70–99)
Potassium: 3.8 mmol/L (ref 3.5–5.1)
Sodium: 137 mmol/L (ref 135–145)
Total Bilirubin: 0.6 mg/dL (ref <1.2)
Total Protein: 6.5 g/dL (ref 6.5–8.1)

## 2023-02-12 ENCOUNTER — Encounter: Payer: Self-pay | Admitting: Internal Medicine

## 2023-02-12 ENCOUNTER — Non-Acute Institutional Stay (SKILLED_NURSING_FACILITY): Payer: Self-pay | Admitting: Internal Medicine

## 2023-02-12 DIAGNOSIS — E0590 Thyrotoxicosis, unspecified without thyrotoxic crisis or storm: Secondary | ICD-10-CM | POA: Diagnosis not present

## 2023-02-12 DIAGNOSIS — R627 Adult failure to thrive: Secondary | ICD-10-CM | POA: Diagnosis not present

## 2023-02-12 DIAGNOSIS — E440 Moderate protein-calorie malnutrition: Secondary | ICD-10-CM

## 2023-02-12 DIAGNOSIS — J9611 Chronic respiratory failure with hypoxia: Secondary | ICD-10-CM | POA: Diagnosis not present

## 2023-02-12 DIAGNOSIS — D649 Anemia, unspecified: Secondary | ICD-10-CM

## 2023-02-12 DIAGNOSIS — F01C0 Vascular dementia, severe, without behavioral disturbance, psychotic disturbance, mood disturbance, and anxiety: Secondary | ICD-10-CM | POA: Insufficient documentation

## 2023-02-12 NOTE — Patient Instructions (Signed)
 See assessment and plan under each diagnosis in the problem list and acutely for this visit

## 2023-02-12 NOTE — Assessment & Plan Note (Addendum)
 She is in  restorative dining and is on nutritional supplement.  Albumin has improved slightly with current value of 3.2, prior values 2.9.  Total protein is low normal at 6.5.  Nutritionist to monitor at [facility].

## 2023-02-12 NOTE — Progress Notes (Signed)
   NURSING HOME LOCATION:  [facility] ROOM NUMBER:  114 W  CODE STATUS:  DNR  PCP:  Barnie Seip NP  This is a nursing facility follow up visit of chronic medical diagnoses & to document compliance with Regulation 483.30 (c) in The Long Term Care Survey Manual Phase 2 which mandates caregiver visit ( visits can alternate among physician, PA or NP as per statutes) within 10 days of 30 days / 60 days/ 90 days post admission to SNF date    Interim medical record and care since last SNF visit was updated with review of diagnostic studies and change in clinical status since last visit were documented.  HPI: She is a permanent resident of this facility with medical diagnoses of GERD, history of goiter, essential hypertension, recurrent chronic depression, osteoarthritis, osteoporosis, vitamin D  insufficiency, and dementia.   Extensive labs were completed 01/23/2023 and revealed slight hypocalcemia with a value of 8.7, down from 9.0.  Albumin has improved from 2.9 up to 3.2.  Total protein is low normal at 6.5.  She is in restorative dining and is on a nutritional supplement.  Anemia was stable with H/H of 11.7/37.3. She has on generic tapazole ; the most recent thyroid  function test were normal or therapeutic on 08/27/2022.  Review of systems: Dementia invalidated responses.  She can provide no meaningful history and speech was essentially nonsensical.  When I asked if she were having any pain her response was eleven - teen.  She then stated that she was having no problem and laughed.  Physical exam:  Pertinent or positive findings: She appears her age and chronically ill.  She was initially asleep exhibiting hypopnea without frank apnea or snoring.  She is wearing nasal oxygen .  Mouth was agape.  She did arouse easily; facies were blank.  She has bilateral ptosis.  There is malalignment of the lower teeth and the maxilla is essentially edentulous. Large asymmetric goiter present.  Heart sounds are distant.  Breath sounds are decreased.  Pedal pulses are barely palpable.  There is insignificant trace edema at the sock line.  General appearance:  no acute distress, increased work of breathing is present.   Lymphatic: No lymphadenopathy about the head, neck, axilla. Eyes: No conjunctival inflammation or lid edema is present. There is no scleral icterus. Ears:  External ear exam shows no significant lesions or deformities.   Nose:  External nasal examination shows no deformity or inflammation. Nasal mucosa are pink and moist without lesions, exudates Neck:  No thyromegaly, masses, tenderness noted.    Heart:  No gallop, murmur, click, rub .  Lungs:  without wheezes, rhonchi, rales, rubs. Abdomen: Bowel sounds are normal. Abdomen is soft and nontender with no organomegaly, hernias, masses. GU: Deferred  Extremities:  No cyanosis, clubbing Neurologic exam :Balance, Rhomberg, finger to nose testing could not be completed due to clinical state Skin: Warm & dry w/o tenting. No significant lesions or rash.  See summary under each active problem in the Problem List with associated updated therapeutic plan

## 2023-02-12 NOTE — Assessment & Plan Note (Signed)
 Serially H/H is stable with current values of 11.7/37.3.  No bleeding dyscrasias reported by staff; continue to monitor.

## 2023-02-12 NOTE — Assessment & Plan Note (Addendum)
 Failure to thrive is multifactorial including advanced age, vascular dementia, protein/caloric malnutrition , O2 dependent COPD,and subclinical hyperthyroidism. DNR status is appropriate.

## 2023-02-12 NOTE — Assessment & Plan Note (Signed)
 No behavioral issues reported. No indication for pyschotropic med change.

## 2023-02-12 NOTE — Assessment & Plan Note (Signed)
 Thyroid function test were normal or therapeutic on 7/23.  These will need to be updated as she is on methimazole.

## 2023-02-14 NOTE — Assessment & Plan Note (Signed)
 O2 sats good on supplemental O2. Quiet lungs of advanced COPD. No change indicated in absence of COPD exacerbation.

## 2023-03-18 ENCOUNTER — Encounter: Payer: Self-pay | Admitting: Adult Health

## 2023-03-18 ENCOUNTER — Non-Acute Institutional Stay (SKILLED_NURSING_FACILITY): Payer: Self-pay | Admitting: Adult Health

## 2023-03-18 DIAGNOSIS — M150 Primary generalized (osteo)arthritis: Secondary | ICD-10-CM | POA: Diagnosis not present

## 2023-03-18 DIAGNOSIS — K5909 Other constipation: Secondary | ICD-10-CM

## 2023-03-18 DIAGNOSIS — I209 Angina pectoris, unspecified: Secondary | ICD-10-CM

## 2023-03-18 NOTE — Progress Notes (Signed)
 Location:  [facility] Room Number: 112 Place of Service:  SNF (31)   CODE STATUS: dnr   Allergies  Allergen Reactions   Hydrocodone-Acetaminophen Nausea And Vomiting    Chief Complaint  Patient presents with   Medical Management of Chronic Issues       Chronic constipation:    Angina pectoris unspecified:  Primary osteoarthritis of multiple joints.     HPI:  She is a 89 year old long term resident of this facility being seen for the management of her chronic illnesses: Chronic constipation:    Angina pectoris unspecified:  Primary osteoarthritis of multiple joints.  There are no reports of uncontrolled pain. She is being weaned off remeron. This medication had been allowing her to maintain her weight. Over the past couple of months she has been losing weight. Will check her thyroid labs.   Past Medical History:  Diagnosis Date   Anxiety    Dementia (HCC)    GERD (gastroesophageal reflux disease)    Goiter    Hypertension    Major depression, recurrent, chronic (HCC) 02/22/2019   Osteoarthritis    Osteoporosis    Vitamin D insufficiency     Past Surgical History:  Procedure Laterality Date   APPENDECTOMY     KIDNEY STONE SURGERY     ORIF HIP FRACTURE Right 11/20/2012   Procedure: OPEN REDUCTION INTERNAL FIXATION RIGHT HIP;  Surgeon: Darreld Mclean, MD;  Location: [facility];  Service: Orthopedics;  Laterality: Right;   stress fractures of legs     TUBAL LIGATION      Social History   Socioeconomic History   Marital status: Widowed    Spouse name: Not on file   Number of children: Not on file   Years of education: Not on file   Highest education level: Not on file  Occupational History   Occupation: retired  Tobacco Use   Smoking status: Never   Smokeless tobacco: Never  Vaping Use   Vaping status: Never Used  Substance and Sexual Activity   Alcohol use: No   Drug use: No   Sexual activity: Not Currently  Other Topics Concern   Not  on file  Social History Narrative   Long term resident of [facility]    Social Drivers of Health   Financial Resource Strain: Not on file  Food Insecurity: Not on file  Transportation Needs: Not on file  Physical Activity: Not on file  Stress: Not on file  Social Connections: Not on file  Intimate Partner Violence: Not on file   Family History  Problem Relation Age of Onset   Heart attack Father    Heart disease Neg Hx       VITAL SIGNS BP 138/76   Pulse 68   Temp 98.7 F (37.1 C)   Resp 18   Ht 4' 10.5" (1.486 m)   Wt 109 lb 12.8 oz (49.8 kg)   SpO2 98%   BMI 22.56 kg/m   Outpatient Encounter Medications as of 03/18/2023  Medication Sig   acetaminophen (TYLENOL) 325 MG tablet Take 650 mg by mouth 2 (two) times daily. 10 am and 10 pm   Balsam [place]-Castor Oil (VENELEX) OINT Apply 1 Application topically.   methimazole (TAPAZOLE) 5 MG tablet Take 5 mg by mouth daily.   methocarbamol (ROBAXIN) 500 MG tablet Take 0.5 tablets (250 mg total) by mouth every 8 (eight) hours as needed for muscle spasms.   mirtazapine (REMERON) 7.5 MG tablet Take  7.5 mg by mouth every other day. Give at HS   NON FORMULARY Diet:Dysphagia 3 (mechanical soft), thin liquids. OK to have soft sandwiches per LPN request.   Nutritional Supplements (ENSURE ENLIVE PO) Take 1 Can by mouth daily.   OXYGEN Inhale 2 L into the lungs continuous.   sennosides-docusate sodium (SENOKOT-S) 8.6-50 MG tablet Take 1 tablet by mouth daily.   No facility-administered encounter medications on file as of 03/18/2023.     SIGNIFICANT DIAGNOSTIC EXAMS  PREVIOUS  09-20-21: dexa: t score: -5.527  05-31-22: right knee x-ray:  No definite radiographic evidence of acuate fracture or dislocation Mild joint effusion Mild degree of osteopenia Moderate osteoarthritis  05-31-22: right hip x-ray:  No radiographic evidence of acute fracture or dislocation S/p right ORIF with intramedullary rod and screw fixation of a healed  intertrochanteric fracture with neal anatomic alignment.     06-10-22: right lower extremity venous doppler No DVT right lower extremity   NO NEW EXAMS    LABS REVIEWED PREVIOUS     05-09-22: wbc 7.3; hgb 11.8; hct 38.2 plt 95.5 plt 181; glucose 120; bun 21; creat 0.73; k+ 4.3; na++ 141; ca 9.0; gfr >60; protein 7.2 albumin 3.1 tsh 0.221 vitamin B 12: 314 mag 2.5 06-20-22: protein 6.6; albumin 3.0 08-27-22: tsh 2.590 free t4: 0.81 free t3: 2.5  12-21-22: protein 6.1 albumin 2.1 01-23-23: wbc 5.6; hgb 11.7; hct 37.3; mcv 91.9 plt 225; glucose 98; bun 14; creat 0.61; k+ 3.8; na++ 137; ca 8.7; gfr >60; protein 6.5 albumin 3.2    NO NEW LABS.     Review of Systems  Unable to perform ROS: Dementia   Physical Exam Constitutional:      General: She is not in acute distress.    Appearance: She is underweight. She is not diaphoretic.  Neck:     Thyroid: Thyroid mass and thyromegaly present.  Cardiovascular:     Rate and Rhythm: Normal rate and regular rhythm.     Pulses: Normal pulses.     Heart sounds: Normal heart sounds.  Pulmonary:     Effort: Pulmonary effort is normal. No respiratory distress.     Breath sounds: Normal breath sounds.  Abdominal:     General: Bowel sounds are normal. There is no distension.     Palpations: Abdomen is soft.     Tenderness: There is no abdominal tenderness.  Musculoskeletal:        General: Normal range of motion.     Cervical back: Neck supple.     Right lower leg: No edema.     Left lower leg: No edema.  Lymphadenopathy:     Cervical: No cervical adenopathy.  Skin:    General: Skin is warm and dry.  Neurological:     Mental Status: She is alert. Mental status is at baseline.  Psychiatric:        Mood and Affect: Mood normal.       ASSESSMENT/ PLAN:  TODAY  Chronic constipation: will continue senna s nightly   2. Angina pectoris unspecified: no reports of chest pain present.   3. Primary osteoarthritis of multiple joints. Is off  voltaren gel and will continue tylenol 650 mg twice daily   PREVIOUS   4. GERD without esophagitis: has history of GI bleed  is off PPI    5. Vascular dementia without behavioral disturbance: weight is 109 pounds; will continue remeron 7.5 mg every other night through 03-26-23; this medication is no longer preventing weight loss  and is not providing any benefit.   6. Essential hypertension: b/p 138/76 is off ARB    7. Post menopausal osteoporosis t score -5.527 is on supplements.   8. Goiter: tsh 2.590 free t4: 0.81  9. Normochromic anemia: hgb 11.8  10. Severe protein calorie malnutrition: albumin 2.9 will continue supplements as directed.    [name] NP [facility]  call [phone]

## 2023-03-20 ENCOUNTER — Other Ambulatory Visit (HOSPITAL_COMMUNITY)
Admission: RE | Admit: 2023-03-20 | Discharge: 2023-03-20 | Disposition: A | Discharge status: Home / Self Care | Payer: Medicare Other | Source: Skilled Nursing Facility | Attending: Adult Health | Admitting: Adult Health

## 2023-03-20 DIAGNOSIS — E049 Nontoxic goiter, unspecified: Secondary | ICD-10-CM | POA: Insufficient documentation

## 2023-03-20 LAB — TSH: TSH: 3.268 u[IU]/mL (ref 0.350–4.500)

## 2023-03-20 LAB — T4, FREE: Free T4: 0.8 ng/dL (ref 0.61–1.12)

## 2023-03-21 DIAGNOSIS — L602 Onychogryphosis: Secondary | ICD-10-CM | POA: Diagnosis not present

## 2023-03-21 DIAGNOSIS — L603 Nail dystrophy: Secondary | ICD-10-CM | POA: Diagnosis not present

## 2023-03-21 DIAGNOSIS — L84 Corns and callosities: Secondary | ICD-10-CM | POA: Diagnosis not present

## 2023-03-21 DIAGNOSIS — I739 Peripheral vascular disease, unspecified: Secondary | ICD-10-CM | POA: Diagnosis not present

## 2023-03-24 DIAGNOSIS — K5909 Other constipation: Secondary | ICD-10-CM | POA: Insufficient documentation

## 2023-03-24 LAB — T3, FREE

## 2023-03-25 ENCOUNTER — Other Ambulatory Visit (HOSPITAL_COMMUNITY)
Admission: RE | Admit: 2023-03-25 | Discharge: 2023-03-25 | Disposition: A | Discharge status: Home / Self Care | Payer: Medicare Other | Source: Skilled Nursing Facility | Attending: Adult Health | Admitting: Adult Health

## 2023-03-25 DIAGNOSIS — E049 Nontoxic goiter, unspecified: Secondary | ICD-10-CM | POA: Insufficient documentation

## 2023-03-25 LAB — TSH: TSH: 2.981 u[IU]/mL (ref 0.350–4.500)

## 2023-03-26 LAB — T3, FREE: T3, Free: 2.7 pg/mL (ref 2.0–4.4)

## 2023-04-11 ENCOUNTER — Encounter: Payer: Self-pay | Admitting: Adult Health

## 2023-04-11 ENCOUNTER — Non-Acute Institutional Stay (SKILLED_NURSING_FACILITY): Payer: Self-pay | Admitting: Adult Health

## 2023-04-11 DIAGNOSIS — E440 Moderate protein-calorie malnutrition: Secondary | ICD-10-CM

## 2023-04-11 DIAGNOSIS — R627 Adult failure to thrive: Secondary | ICD-10-CM

## 2023-04-11 DIAGNOSIS — J9611 Chronic respiratory failure with hypoxia: Secondary | ICD-10-CM | POA: Diagnosis not present

## 2023-04-11 NOTE — Progress Notes (Signed)
 Location:  [facility] Room Number: 115 Place of Service:  SNF (31)   CODE STATUS: dnr   Allergies  Allergen Reactions   Hydrocodone-Acetaminophen Nausea And Vomiting    Chief Complaint  Patient presents with   Acute Visit    Care plan meeting     HPI:  We have come together for her care plan meeting. BIMS 3/15 mood 0/30. Uses wheelchair without falls. She requires dependent assist with her adl care. She is incontinent of bladder and frequently incontinent of bowel. Dietary: D3 with sandwich; setup for meals appetite 26-100%. She has been losing weight. She had been on remeron and experienced a slow weight loss. This medication was stopped; however since stopping this medication her weight loss has increased. Therapy: none at this time. Will continue to follow her chronic illnesses including:  Chronic respiratory failure with hypoxia   Failure to thrive in adult   Moderate protein calorie malnutrition  Past Medical History:  Diagnosis Date   Anxiety    Dementia (HCC)    GERD (gastroesophageal reflux disease)    Goiter    Hypertension    Major depression, recurrent, chronic (HCC) 02/22/2019   Osteoarthritis    Osteoporosis    Vitamin D insufficiency     Past Surgical History:  Procedure Laterality Date   APPENDECTOMY     KIDNEY STONE SURGERY     ORIF HIP FRACTURE Right 11/20/2012   Procedure: OPEN REDUCTION INTERNAL FIXATION RIGHT HIP;  Surgeon: Darreld Mclean, MD;  Location: [facility];  Service: Orthopedics;  Laterality: Right;   stress fractures of legs     TUBAL LIGATION      Social History   Socioeconomic History   Marital status: Widowed    Spouse name: Not on file   Number of children: Not on file   Years of education: Not on file   Highest education level: Not on file  Occupational History   Occupation: retired  Tobacco Use   Smoking status: Never   Smokeless tobacco: Never  Vaping Use   Vaping status: Never Used  Substance and  Sexual Activity   Alcohol use: No   Drug use: No   Sexual activity: Not Currently  Other Topics Concern   Not on file  Social History Narrative   Long term resident of [facility]    Social Drivers of Health   Financial Resource Strain: Not on file  Food Insecurity: Not on file  Transportation Needs: Not on file  Physical Activity: Not on file  Stress: Not on file  Social Connections: Not on file  Intimate Partner Violence: Not on file   Family History  Problem Relation Age of Onset   Heart attack Father    Heart disease Neg Hx       VITAL SIGNS BP (!) 142/62   Pulse 80   Temp (!) 97.2 F (36.2 C)   Resp (!) 24   Ht 4\' 10"  (1.473 m)   Wt 106 lb (48.1 kg)   SpO2 98%   BMI 22.15 kg/m   Outpatient Encounter Medications as of 04/11/2023  Medication Sig   acetaminophen (TYLENOL) 325 MG tablet Take 650 mg by mouth 2 (two) times daily. 10 am and 10 pm   Balsam [place]-Castor Oil (VENELEX) OINT Apply 1 Application topically.   methimazole (TAPAZOLE) 5 MG tablet Take 5 mg by mouth daily.   methocarbamol (ROBAXIN) 500 MG tablet Take 0.5 tablets (250 mg total) by mouth every 8 (eight)  hours as needed for muscle spasms.   NON FORMULARY Diet:Dysphagia 3 (mechanical soft), thin liquids. OK to have soft sandwiches per LPN request.   Nutritional Supplements (ENSURE ENLIVE PO) Take 1 Can by mouth daily.   OXYGEN Inhale 2 L into the lungs continuous.   sennosides-docusate sodium (SENOKOT-S) 8.6-50 MG tablet Take 1 tablet by mouth daily.   No facility-administered encounter medications on file as of 04/11/2023.     SIGNIFICANT DIAGNOSTIC EXAMS  PREVIOUS  09-20-21: dexa: t score: -5.527  05-31-22: right knee x-ray:  No definite radiographic evidence of acuate fracture or dislocation Mild joint effusion Mild degree of osteopenia Moderate osteoarthritis  05-31-22: right hip x-ray:  No radiographic evidence of acute fracture or dislocation S/p right ORIF with intramedullary rod and screw  fixation of a healed intertrochanteric fracture with neal anatomic alignment.     06-10-22: right lower extremity venous doppler No DVT right lower extremity   NO NEW EXAMS    LABS REVIEWED PREVIOUS     05-09-22: wbc 7.3; hgb 11.8; hct 38.2 plt 95.5 plt 181; glucose 120; bun 21; creat 0.73; k+ 4.3; na++ 141; ca 9.0; gfr >60; protein 7.2 albumin 3.1 tsh 0.221 vitamin B 12: 314 mag 2.5 06-20-22: protein 6.6; albumin 3.0 08-27-22: tsh 2.590 free t4: 0.81 free t3: 2.5  12-21-22: protein 6.1 albumin 2.1 01-23-23: wbc 5.6; hgb 11.7; hct 37.3; mcv 91.9 plt 225; glucose 98; bun 14; creat 0.61; k+ 3.8; na++ 137; ca 8.7; gfr >60; protein 6.5 albumin 3.2    NO NEW LABS.     Review of Systems  Unable to perform ROS: Dementia   Physical Exam Constitutional:      General: She is not in acute distress.    Appearance: She is underweight. She is not diaphoretic.  Neck:     Thyroid: Thyroid mass and thyromegaly present.  Cardiovascular:     Rate and Rhythm: Normal rate and regular rhythm.     Pulses: Normal pulses.     Heart sounds: Normal heart sounds.  Pulmonary:     Effort: Pulmonary effort is normal. No respiratory distress.     Breath sounds: Normal breath sounds.  Abdominal:     General: Bowel sounds are normal. There is no distension.     Palpations: Abdomen is soft.     Tenderness: There is no abdominal tenderness.  Musculoskeletal:        General: Normal range of motion.     Cervical back: Neck supple.     Right lower leg: No edema.     Left lower leg: No edema.  Lymphadenopathy:     Cervical: No cervical adenopathy.  Skin:    General: Skin is warm and dry.  Neurological:     Mental Status: She is alert. Mental status is at baseline.  Psychiatric:        Mood and Affect: Mood normal.     ASSESSMENT/ PLAN:  TODAY  Chronic respiratory failure with hypoxia Failure to thrive in adult Moderate protein calorie malnutrition  Will restart remeron at 7.5 mg nightly  Will  continue current plan of care Will continue to monitor her status.    [name] NP [facility]  call [phone]

## 2023-04-15 ENCOUNTER — Non-Acute Institutional Stay (SKILLED_NURSING_FACILITY): Payer: Self-pay | Admitting: Adult Health

## 2023-04-15 ENCOUNTER — Encounter: Payer: Self-pay | Admitting: Adult Health

## 2023-04-15 DIAGNOSIS — F01C0 Vascular dementia, severe, without behavioral disturbance, psychotic disturbance, mood disturbance, and anxiety: Secondary | ICD-10-CM | POA: Diagnosis not present

## 2023-04-15 DIAGNOSIS — K219 Gastro-esophageal reflux disease without esophagitis: Secondary | ICD-10-CM | POA: Diagnosis not present

## 2023-04-15 DIAGNOSIS — I10 Essential (primary) hypertension: Secondary | ICD-10-CM

## 2023-04-15 NOTE — Progress Notes (Unsigned)
 Location:  [facility] Room Number: 114 Place of Service:  SNF (31)   CODE STATUS: dnr   Allergies  Allergen Reactions   Hydrocodone-Acetaminophen Nausea And Vomiting    Chief Complaint  Patient presents with   Medical Management of Chronic Issues           GERD without esophagitis:  Vascular dementia without behavioral disturbance: Essential hypertension:     HPI:  She is a 89 year old resident of this facility being seen for the management of her chronic illnesses:  GERD without esophagitis:  Vascular dementia without behavioral disturbance: Essential hypertension. There are no reports of uncontrolled pain. She continues to slowly lose weight; she did not tolerate being off remeron. There are no reports of anxiety or depressive thoughts.   Past Medical History:  Diagnosis Date   Anxiety    Dementia (HCC)    GERD (gastroesophageal reflux disease)    Goiter    Hypertension    Major depression, recurrent, chronic (HCC) 02/22/2019   Osteoarthritis    Osteoporosis    Vitamin D insufficiency     Past Surgical History:  Procedure Laterality Date   APPENDECTOMY     KIDNEY STONE SURGERY     ORIF HIP FRACTURE Right 11/20/2012   Procedure: OPEN REDUCTION INTERNAL FIXATION RIGHT HIP;  Surgeon: Darreld Mclean, MD;  Location: [facility];  Service: Orthopedics;  Laterality: Right;   stress fractures of legs     TUBAL LIGATION      Social History   Socioeconomic History   Marital status: Widowed    Spouse name: Not on file   Number of children: Not on file   Years of education: Not on file   Highest education level: Not on file  Occupational History   Occupation: retired  Tobacco Use   Smoking status: Never   Smokeless tobacco: Never  Vaping Use   Vaping status: Never Used  Substance and Sexual Activity   Alcohol use: No   Drug use: No   Sexual activity: Not Currently  Other Topics Concern   Not on file  Social History Narrative   Long term  resident of [facility]    Social Drivers of Health   Financial Resource Strain: Not on file  Food Insecurity: Not on file  Transportation Needs: Not on file  Physical Activity: Not on file  Stress: Not on file  Social Connections: Not on file  Intimate Partner Violence: Not on file   Family History  Problem Relation Age of Onset   Heart attack Father    Heart disease Neg Hx       VITAL SIGNS BP 138/69   Pulse 71   Temp (!) 97 F (36.1 C)   Resp 20   Ht 4\' 10"  (1.473 m)   Wt 106 lb (48.1 kg)   SpO2 96%   BMI 22.15 kg/m   Outpatient Encounter Medications as of 04/15/2023  Medication Sig   acetaminophen (TYLENOL) 325 MG tablet Take 650 mg by mouth 2 (two) times daily. 10 am and 10 pm   Balsam [place]-Castor Oil (VENELEX) OINT Apply 1 Application topically.   methimazole (TAPAZOLE) 5 MG tablet Take 5 mg by mouth daily.   methocarbamol (ROBAXIN) 500 MG tablet Take 0.5 tablets (250 mg total) by mouth every 8 (eight) hours as needed for muscle spasms.   mirtazapine (REMERON) 7.5 MG tablet Take 7.5 mg by mouth at bedtime.   NON FORMULARY Diet:Dysphagia 3 (mechanical soft), thin  liquids. OK to have soft sandwiches per LPN request.   Nutritional Supplements (ENSURE ENLIVE PO) Take 1 Can by mouth daily.   OXYGEN Inhale 2 L into the lungs continuous.   sennosides-docusate sodium (SENOKOT-S) 8.6-50 MG tablet Take 1 tablet by mouth daily.   No facility-administered encounter medications on file as of 04/15/2023.     SIGNIFICANT DIAGNOSTIC EXAMS  PREVIOUS  09-20-21: dexa: t score: -5.527  05-31-22: right knee x-ray:  No definite radiographic evidence of acuate fracture or dislocation Mild joint effusion Mild degree of osteopenia Moderate osteoarthritis  05-31-22: right hip x-ray:  No radiographic evidence of acute fracture or dislocation S/p right ORIF with intramedullary rod and screw fixation of a healed intertrochanteric fracture with neal anatomic alignment.     06-10-22: right  lower extremity venous doppler No DVT right lower extremity   NO NEW EXAMS    LABS REVIEWED PREVIOUS     05-09-22: wbc 7.3; hgb 11.8; hct 38.2 plt 95.5 plt 181; glucose 120; bun 21; creat 0.73; k+ 4.3; na++ 141; ca 9.0; gfr >60; protein 7.2 albumin 3.1 tsh 0.221 vitamin B 12: 314 mag 2.5 06-20-22: protein 6.6; albumin 3.0 08-27-22: tsh 2.590 free t4: 0.81 free t3: 2.5  12-21-22: protein 6.1 albumin 2.1 01-23-23: wbc 5.6; hgb 11.7; hct 37.3; mcv 91.9 plt 225; glucose 98; bun 14; creat 0.61; k+ 3.8; na++ 137; ca 8.7; gfr >60; protein 6.5 albumin 3.2    NO NEW LABS.     Review of Systems  Unable to perform ROS: Dementia   Physical Exam Constitutional:      General: She is not in acute distress.    Appearance: She is underweight. She is not diaphoretic.  Neck:     Thyroid: Thyroid mass and thyromegaly present.  Cardiovascular:     Rate and Rhythm: Normal rate and regular rhythm.     Pulses: Normal pulses.     Heart sounds: Normal heart sounds.  Pulmonary:     Effort: Pulmonary effort is normal. No respiratory distress.     Breath sounds: Normal breath sounds.  Abdominal:     General: Bowel sounds are normal. There is no distension.     Palpations: Abdomen is soft.     Tenderness: There is no abdominal tenderness.  Musculoskeletal:        General: Normal range of motion.     Cervical back: Neck supple.     Right lower leg: No edema.     Left lower leg: No edema.  Lymphadenopathy:     Cervical: No cervical adenopathy.  Skin:    General: Skin is warm and dry.  Neurological:     Mental Status: She is alert. Mental status is at baseline.  Psychiatric:        Mood and Affect: Mood normal.      ASSESSMENT/ PLAN:  TODAY  GERD without esophagitis: has history of GIB is off PPI  2. Vascular dementia without behavioral disturbance: weight is 106 pounds she has failed remeron once again; and has been restarted at 7.5 mg nightly. She is slowly losing weight. Her current weight is  106 pounds,   3. Essential hypertension: b/p 138/69 is off ARB   PREVIOUS   4. Post menopausal osteoporosis t score -5.527 is on supplements.   5. Goiter: tsh 2.590 free t4: 0.81  6. Normochromic anemia: hgb 11.8  7. Severe protein calorie malnutrition: albumin 2.9 will continue supplements as directed.   8. Chronic constipation: will continue senna s  nightly   9. Angina pectoris unspecified: no reports of chest pain present.   10. Primary osteoarthritis of multiple joints. Is off voltaren gel and will continue tylenol 650 mg twice daily    [name] NP [facility]  call [phone]

## 2023-04-22 ENCOUNTER — Emergency Department (HOSPITAL_COMMUNITY)

## 2023-04-22 ENCOUNTER — Inpatient Hospital Stay (HOSPITAL_COMMUNITY)
Admission: EM | Admit: 2023-04-22 | Discharge: 2023-04-28 | DRG: 481 | Disposition: A | Discharge status: Skilled Nursing Facility | Source: Skilled Nursing Facility | Attending: Internal Medicine | Admitting: Internal Medicine

## 2023-04-22 ENCOUNTER — Other Ambulatory Visit: Payer: Self-pay

## 2023-04-22 ENCOUNTER — Inpatient Hospital Stay (HOSPITAL_COMMUNITY)

## 2023-04-22 ENCOUNTER — Encounter: Payer: Self-pay | Admitting: Adult Health

## 2023-04-22 ENCOUNTER — Non-Acute Institutional Stay (SKILLED_NURSING_FACILITY): Payer: Self-pay | Admitting: Adult Health

## 2023-04-22 DIAGNOSIS — I209 Angina pectoris, unspecified: Secondary | ICD-10-CM | POA: Diagnosis not present

## 2023-04-22 DIAGNOSIS — S72102D Unspecified trochanteric fracture of left femur, subsequent encounter for closed fracture with routine healing: Secondary | ICD-10-CM | POA: Diagnosis not present

## 2023-04-22 DIAGNOSIS — S7292XA Unspecified fracture of left femur, initial encounter for closed fracture: Secondary | ICD-10-CM | POA: Diagnosis not present

## 2023-04-22 DIAGNOSIS — S51812A Laceration without foreign body of left forearm, initial encounter: Secondary | ICD-10-CM | POA: Diagnosis present

## 2023-04-22 DIAGNOSIS — M810 Age-related osteoporosis without current pathological fracture: Secondary | ICD-10-CM | POA: Diagnosis present

## 2023-04-22 DIAGNOSIS — R1310 Dysphagia, unspecified: Secondary | ICD-10-CM | POA: Diagnosis not present

## 2023-04-22 DIAGNOSIS — M9689 Other intraoperative and postprocedural complications and disorders of the musculoskeletal system: Secondary | ICD-10-CM | POA: Diagnosis not present

## 2023-04-22 DIAGNOSIS — K219 Gastro-esophageal reflux disease without esophagitis: Secondary | ICD-10-CM | POA: Diagnosis not present

## 2023-04-22 DIAGNOSIS — D62 Acute posthemorrhagic anemia: Secondary | ICD-10-CM | POA: Diagnosis not present

## 2023-04-22 DIAGNOSIS — S7222XA Displaced subtrochanteric fracture of left femur, initial encounter for closed fracture: Secondary | ICD-10-CM | POA: Diagnosis present

## 2023-04-22 DIAGNOSIS — Z743 Need for continuous supervision: Secondary | ICD-10-CM | POA: Diagnosis not present

## 2023-04-22 DIAGNOSIS — E119 Type 2 diabetes mellitus without complications: Secondary | ICD-10-CM | POA: Diagnosis present

## 2023-04-22 DIAGNOSIS — S79919A Unspecified injury of unspecified hip, initial encounter: Secondary | ICD-10-CM | POA: Diagnosis not present

## 2023-04-22 DIAGNOSIS — Z66 Do not resuscitate: Secondary | ICD-10-CM | POA: Diagnosis present

## 2023-04-22 DIAGNOSIS — Z79899 Other long term (current) drug therapy: Secondary | ICD-10-CM

## 2023-04-22 DIAGNOSIS — Y838 Other surgical procedures as the cause of abnormal reaction of the patient, or of later complication, without mention of misadventure at the time of the procedure: Secondary | ICD-10-CM | POA: Diagnosis not present

## 2023-04-22 DIAGNOSIS — F411 Generalized anxiety disorder: Secondary | ICD-10-CM | POA: Diagnosis not present

## 2023-04-22 DIAGNOSIS — F01C0 Vascular dementia, severe, without behavioral disturbance, psychotic disturbance, mood disturbance, and anxiety: Secondary | ICD-10-CM

## 2023-04-22 DIAGNOSIS — K0889 Other specified disorders of teeth and supporting structures: Secondary | ICD-10-CM | POA: Diagnosis present

## 2023-04-22 DIAGNOSIS — Z9981 Dependence on supplemental oxygen: Secondary | ICD-10-CM | POA: Diagnosis not present

## 2023-04-22 DIAGNOSIS — Z885 Allergy status to narcotic agent status: Secondary | ICD-10-CM

## 2023-04-22 DIAGNOSIS — S60222A Contusion of left hand, initial encounter: Secondary | ICD-10-CM | POA: Diagnosis present

## 2023-04-22 DIAGNOSIS — J449 Chronic obstructive pulmonary disease, unspecified: Secondary | ICD-10-CM | POA: Diagnosis present

## 2023-04-22 DIAGNOSIS — Y92234 Operating room of hospital as the place of occurrence of the external cause: Secondary | ICD-10-CM | POA: Diagnosis not present

## 2023-04-22 DIAGNOSIS — F0390 Unspecified dementia without behavioral disturbance: Secondary | ICD-10-CM | POA: Diagnosis present

## 2023-04-22 DIAGNOSIS — G9341 Metabolic encephalopathy: Secondary | ICD-10-CM | POA: Diagnosis not present

## 2023-04-22 DIAGNOSIS — M9702XA Periprosthetic fracture around internal prosthetic left hip joint, initial encounter: Secondary | ICD-10-CM | POA: Diagnosis not present

## 2023-04-22 DIAGNOSIS — S72102A Unspecified trochanteric fracture of left femur, initial encounter for closed fracture: Secondary | ICD-10-CM | POA: Diagnosis present

## 2023-04-22 DIAGNOSIS — Z741 Need for assistance with personal care: Secondary | ICD-10-CM | POA: Diagnosis not present

## 2023-04-22 DIAGNOSIS — Z4789 Encounter for other orthopedic aftercare: Secondary | ICD-10-CM | POA: Diagnosis not present

## 2023-04-22 DIAGNOSIS — S79101A Unspecified physeal fracture of lower end of right femur, initial encounter for closed fracture: Secondary | ICD-10-CM | POA: Diagnosis not present

## 2023-04-22 DIAGNOSIS — M19042 Primary osteoarthritis, left hand: Secondary | ICD-10-CM | POA: Diagnosis not present

## 2023-04-22 DIAGNOSIS — E049 Nontoxic goiter, unspecified: Secondary | ICD-10-CM | POA: Diagnosis present

## 2023-04-22 DIAGNOSIS — F01C4 Vascular dementia, severe, with anxiety: Secondary | ICD-10-CM | POA: Diagnosis present

## 2023-04-22 DIAGNOSIS — S72352A Displaced comminuted fracture of shaft of left femur, initial encounter for closed fracture: Secondary | ICD-10-CM | POA: Diagnosis present

## 2023-04-22 DIAGNOSIS — M1990 Unspecified osteoarthritis, unspecified site: Secondary | ICD-10-CM | POA: Diagnosis present

## 2023-04-22 DIAGNOSIS — S79002A Unspecified physeal fracture of upper end of left femur, initial encounter for closed fracture: Secondary | ICD-10-CM | POA: Diagnosis not present

## 2023-04-22 DIAGNOSIS — R600 Localized edema: Secondary | ICD-10-CM | POA: Diagnosis not present

## 2023-04-22 DIAGNOSIS — F419 Anxiety disorder, unspecified: Secondary | ICD-10-CM | POA: Diagnosis present

## 2023-04-22 DIAGNOSIS — R6889 Other general symptoms and signs: Secondary | ICD-10-CM | POA: Diagnosis not present

## 2023-04-22 DIAGNOSIS — I499 Cardiac arrhythmia, unspecified: Secondary | ICD-10-CM | POA: Diagnosis not present

## 2023-04-22 DIAGNOSIS — W19XXXA Unspecified fall, initial encounter: Secondary | ICD-10-CM | POA: Diagnosis present

## 2023-04-22 DIAGNOSIS — S72341A Displaced spiral fracture of shaft of right femur, initial encounter for closed fracture: Secondary | ICD-10-CM

## 2023-04-22 DIAGNOSIS — M79642 Pain in left hand: Secondary | ICD-10-CM | POA: Diagnosis not present

## 2023-04-22 DIAGNOSIS — Z9889 Other specified postprocedural states: Secondary | ICD-10-CM

## 2023-04-22 DIAGNOSIS — R279 Unspecified lack of coordination: Secondary | ICD-10-CM | POA: Diagnosis not present

## 2023-04-22 DIAGNOSIS — F01C3 Vascular dementia, severe, with mood disturbance: Secondary | ICD-10-CM | POA: Diagnosis present

## 2023-04-22 DIAGNOSIS — S72001A Fracture of unspecified part of neck of right femur, initial encounter for closed fracture: Secondary | ICD-10-CM | POA: Diagnosis present

## 2023-04-22 DIAGNOSIS — R636 Underweight: Secondary | ICD-10-CM | POA: Diagnosis present

## 2023-04-22 DIAGNOSIS — I10 Essential (primary) hypertension: Secondary | ICD-10-CM | POA: Diagnosis present

## 2023-04-22 DIAGNOSIS — Z8249 Family history of ischemic heart disease and other diseases of the circulatory system: Secondary | ICD-10-CM

## 2023-04-22 DIAGNOSIS — Z043 Encounter for examination and observation following other accident: Secondary | ICD-10-CM | POA: Diagnosis not present

## 2023-04-22 DIAGNOSIS — S72341D Displaced spiral fracture of shaft of right femur, subsequent encounter for closed fracture with routine healing: Secondary | ICD-10-CM | POA: Diagnosis not present

## 2023-04-22 DIAGNOSIS — S72141A Displaced intertrochanteric fracture of right femur, initial encounter for closed fracture: Secondary | ICD-10-CM | POA: Diagnosis not present

## 2023-04-22 DIAGNOSIS — N390 Urinary tract infection, site not specified: Secondary | ICD-10-CM | POA: Diagnosis not present

## 2023-04-22 DIAGNOSIS — R488 Other symbolic dysfunctions: Secondary | ICD-10-CM | POA: Diagnosis not present

## 2023-04-22 DIAGNOSIS — M6281 Muscle weakness (generalized): Secondary | ICD-10-CM | POA: Diagnosis not present

## 2023-04-22 DIAGNOSIS — M160 Bilateral primary osteoarthritis of hip: Secondary | ICD-10-CM | POA: Diagnosis not present

## 2023-04-22 DIAGNOSIS — K5641 Fecal impaction: Secondary | ICD-10-CM | POA: Diagnosis not present

## 2023-04-22 DIAGNOSIS — E0500 Thyrotoxicosis with diffuse goiter without thyrotoxic crisis or storm: Secondary | ICD-10-CM | POA: Diagnosis present

## 2023-04-22 DIAGNOSIS — S72142A Displaced intertrochanteric fracture of left femur, initial encounter for closed fracture: Secondary | ICD-10-CM | POA: Diagnosis present

## 2023-04-22 DIAGNOSIS — Z9049 Acquired absence of other specified parts of digestive tract: Secondary | ICD-10-CM

## 2023-04-22 DIAGNOSIS — S72112A Displaced fracture of greater trochanter of left femur, initial encounter for closed fracture: Secondary | ICD-10-CM | POA: Diagnosis not present

## 2023-04-22 DIAGNOSIS — S72002A Fracture of unspecified part of neck of left femur, initial encounter for closed fracture: Secondary | ICD-10-CM | POA: Diagnosis present

## 2023-04-22 DIAGNOSIS — E559 Vitamin D deficiency, unspecified: Secondary | ICD-10-CM | POA: Diagnosis not present

## 2023-04-22 DIAGNOSIS — S61412A Laceration without foreign body of left hand, initial encounter: Secondary | ICD-10-CM | POA: Diagnosis present

## 2023-04-22 DIAGNOSIS — Z87442 Personal history of urinary calculi: Secondary | ICD-10-CM

## 2023-04-22 DIAGNOSIS — S72331A Displaced oblique fracture of shaft of right femur, initial encounter for closed fracture: Secondary | ICD-10-CM | POA: Diagnosis not present

## 2023-04-22 DIAGNOSIS — J9611 Chronic respiratory failure with hypoxia: Secondary | ICD-10-CM | POA: Diagnosis not present

## 2023-04-22 DIAGNOSIS — Z8781 Personal history of (healed) traumatic fracture: Secondary | ICD-10-CM | POA: Diagnosis not present

## 2023-04-22 DIAGNOSIS — S7222XG Displaced subtrochanteric fracture of left femur, subsequent encounter for closed fracture with delayed healing: Secondary | ICD-10-CM | POA: Diagnosis present

## 2023-04-22 DIAGNOSIS — F418 Other specified anxiety disorders: Secondary | ICD-10-CM | POA: Diagnosis not present

## 2023-04-22 DIAGNOSIS — Z6821 Body mass index (BMI) 21.0-21.9, adult: Secondary | ICD-10-CM

## 2023-04-22 DIAGNOSIS — S72301A Unspecified fracture of shaft of right femur, initial encounter for closed fracture: Secondary | ICD-10-CM | POA: Diagnosis not present

## 2023-04-22 DIAGNOSIS — Z9851 Tubal ligation status: Secondary | ICD-10-CM

## 2023-04-22 LAB — CBC
HCT: 34.4 % — ABNORMAL LOW (ref 36.0–46.0)
Hemoglobin: 10.9 g/dL — ABNORMAL LOW (ref 12.0–15.0)
MCH: 29.3 pg (ref 26.0–34.0)
MCHC: 31.7 g/dL (ref 30.0–36.0)
MCV: 92.5 fL (ref 80.0–100.0)
Platelets: 366 10*3/uL (ref 150–400)
RBC: 3.72 MIL/uL — ABNORMAL LOW (ref 3.87–5.11)
RDW: 14.5 % (ref 11.5–15.5)
WBC: 10.8 10*3/uL — ABNORMAL HIGH (ref 4.0–10.5)
nRBC: 0 % (ref 0.0–0.2)

## 2023-04-22 LAB — BASIC METABOLIC PANEL
Anion gap: 11 (ref 5–15)
BUN: 27 mg/dL — ABNORMAL HIGH (ref 8–23)
CO2: 25 mmol/L (ref 22–32)
Calcium: 8.9 mg/dL (ref 8.9–10.3)
Chloride: 105 mmol/L (ref 98–111)
Creatinine, Ser: 0.72 mg/dL (ref 0.44–1.00)
GFR, Estimated: >60 mL/min (ref >60)
Glucose, Bld: 212 mg/dL — ABNORMAL HIGH (ref 70–99)
Potassium: 3.9 mmol/L (ref 3.5–5.1)
Sodium: 141 mmol/L (ref 135–145)

## 2023-04-22 LAB — GLUCOSE, CAPILLARY: Glucose-Capillary: 186 mg/dL — ABNORMAL HIGH (ref 70–99)

## 2023-04-22 MED ORDER — LACTATED RINGERS IV SOLN: INTRAVENOUS | Status: AC

## 2023-04-22 MED ORDER — MORPHINE SULFATE (PF) 2 MG/ML IV SOLN
2.0000 mg | INTRAVENOUS | Status: DC | PRN
  Administered 2023-04-23 – 2023-04-26 (×2): 2 mg via INTRAVENOUS
  Filled 2023-04-22 (×2): qty 1

## 2023-04-22 MED ORDER — ENSURE ENLIVE PO LIQD
237.0000 mL | Freq: Two times a day (BID) | ORAL | Status: DC
  Administered 2023-04-24 – 2023-04-28 (×6): 237 mL via ORAL

## 2023-04-22 MED ORDER — LACTATED RINGERS IV BOLUS
500.0000 mL | Freq: Once | INTRAVENOUS | Status: AC
Start: 2023-04-22 — End: 2023-04-22
  Administered 2023-04-22: 500 mL via INTRAVENOUS

## 2023-04-22 MED ORDER — POLYETHYLENE GLYCOL 3350 17 G PO PACK
17.0000 g | PACK | Freq: Every day | ORAL | Status: DC | PRN
  Administered 2023-04-27: 17 g via ORAL
  Filled 2023-04-22: qty 1

## 2023-04-22 MED ORDER — ONDANSETRON HCL 4 MG/2ML IJ SOLN
4.0000 mg | Freq: Four times a day (QID) | INTRAMUSCULAR | Status: DC | PRN
  Administered 2023-04-23: 4 mg via INTRAVENOUS

## 2023-04-22 MED ORDER — ONDANSETRON HCL 4 MG PO TABS: 4.0000 mg | ORAL_TABLET | Freq: Four times a day (QID) | ORAL | Status: DC | PRN

## 2023-04-22 MED ORDER — ACETAMINOPHEN 650 MG RE SUPP: 650.0000 mg | Freq: Four times a day (QID) | RECTAL | Status: DC | PRN

## 2023-04-22 MED ORDER — ACETAMINOPHEN 325 MG PO TABS: 650.0000 mg | ORAL_TABLET | Freq: Four times a day (QID) | ORAL | Status: DC | PRN

## 2023-04-22 MED ORDER — MORPHINE SULFATE (PF) 2 MG/ML IV SOLN
2.0000 mg | Freq: Once | INTRAVENOUS | Status: AC
  Administered 2023-04-22: 2 mg via INTRAVENOUS
  Filled 2023-04-22: qty 1

## 2023-04-22 MED ORDER — HEPARIN SODIUM (PORCINE) 5000 UNIT/ML IJ SOLN: 5000.0000 [IU] | Freq: Three times a day (TID) | INTRAMUSCULAR | Status: DC

## 2023-04-22 NOTE — Consult Note (Signed)
 Reason for Consult: Fracture left hip Referring Physician: Lorenda Ishihara  [name] is an 89 y.o. female.  HPI: This is a 89 year old female who is now nonambulatory and has been so for approximately 4 to 5 years status post open treatment internal fixation right hip with dynamic hip screw for intertrochanteric fracture back in 2014 but now resides at the local nursing home and is bed to chair.  The patient was found with an externally rotated shortened limb which was an unwitnessed fall according to the family members.  There was some type of remote minor trauma to the hand which may have occurred at the same time as a hip fracture.  However today the limb was noted to be in the abnormal position the patient was brought in by EMS and was found to have a severely comminuted proximal peritrochanteric hip fracture  I have spoken with family members we have discussed this among themselves and have decided to proceed with surgical intervention knowing the risks and complications associated with that.  However they would rather deal with those potential risks and complications versus not doing surgery and the patient's potential demise.    Past Medical History:  Diagnosis Date   Anxiety    Dementia (HCC)    GERD (gastroesophageal reflux disease)    Goiter    Hypertension    Major depression, recurrent, chronic (HCC) 02/22/2019   Osteoarthritis    Osteoporosis    Vitamin D insufficiency     Past Surgical History:  Procedure Laterality Date   APPENDECTOMY     KIDNEY STONE SURGERY     ORIF HIP FRACTURE Right 11/20/2012   Procedure: OPEN REDUCTION INTERNAL FIXATION RIGHT HIP;  Surgeon: Darreld Mclean, MD;  Location: [facility];  Service: Orthopedics;  Laterality: Right;   stress fractures of legs     TUBAL LIGATION      Family History  Problem Relation Age of Onset   Heart attack Father    Heart disease Neg Hx     Social History:  reports that she has never smoked. She has never used  smokeless tobacco. She reports that she does not drink alcohol and does not use drugs.  Allergies:  Allergies  Allergen Reactions   Hydrocodone-Acetaminophen Nausea And Vomiting    Medications: {medication reviewed/display:3041432}  Results for orders placed or performed during the hospital encounter of 04/22/23 (from the past 48 hours)  CBC     Status: Abnormal   Collection Time: 04/22/23  2:40 PM  Result Value Ref Range   WBC 10.8 (H) 4.0 - 10.5 K/uL   RBC 3.72 (L) 3.87 - 5.11 MIL/uL   Hemoglobin 10.9 (L) 12.0 - 15.0 g/dL   HCT 64.3 (L) 32.9 - 51.8 %   MCV 92.5 80.0 - 100.0 fL   MCH 29.3 26.0 - 34.0 pg   MCHC 31.7 30.0 - 36.0 g/dL   RDW 84.1 66.0 - 63.0 %   Platelets 366 150 - 400 K/uL   nRBC 0.0 0.0 - 0.2 %    Comment: Performed at [facility], [address] [place], [place] [address]  Basic metabolic panel     Status: Abnormal   Collection Time: 04/22/23  2:40 PM  Result Value Ref Range   Sodium 141 135 - 145 mmol/L   Potassium 3.9 3.5 - 5.1 mmol/L   Chloride 105 98 - 111 mmol/L   CO2 25 22 - 32 mmol/L   Glucose, Bld 212 (H) 70 - 99 mg/dL  Comment: Glucose reference range applies only to samples taken after fasting for at least 8 hours.   BUN 27 (H) 8 - 23 mg/dL   Creatinine, Ser 5.78 0.44 - 1.00 mg/dL   Calcium 8.9 8.9 - 46.9 mg/dL   GFR, Estimated >62 >95 mL/min    Comment: (NOTE) Calculated using the CKD-EPI Creatinine Equation (2021)    Anion gap 11 5 - 15    Comment: Performed at [facility], [address], [place], [place] [address]    DG Chest 1 View Result Date: 04/22/2023 CLINICAL DATA:  Fall today. EXAM: CHEST  1 VIEW COMPARISON:  February 11, 2019. FINDINGS: The heart size and mediastinal contours are within normal limits. Both lungs are clear. The visualized skeletal structures are unremarkable. IMPRESSION: No active disease. Electronically Signed   By: Lupita Raider M.D.   On: 04/22/2023 17:13   DG HIP UNILAT W OR W/O PELVIS 2-3 VIEWS LEFT Result  Date: 04/22/2023 CLINICAL DATA:  Fall, left hip pain EXAM: DG HIP (WITH OR WITHOUT PELVIS) 2-3V LEFT COMPARISON:  07/18/2017 FINDINGS: There is a left proximal femoral fracture which appears to be just below the intertrochanteric region in the proximal shaft. Varus angulation and displacement. No subluxation or dislocation. Remote posttraumatic and postsurgical changes in the right femur. Degenerative changes in the hips bilaterally, right greater than left. IMPRESSION: Proximal left femoral shaft fracture with varus angulation and displacement. Electronically Signed   By: Charlett Nose M.D.   On: 04/22/2023 17:13    Review of Systems Blood pressure 117/78, pulse (!) 108, temperature 98.6 F (37 C), temperature source Oral, resp. rate 18, height 4' 10.5" (1.486 m), weight 48.1 kg, SpO2 95%. Physical Exam  Assessment/Plan: ***  Fuller Canada 04/22/2023, 7:02 PM

## 2023-04-22 NOTE — H&P (Addendum)
 History and Physical    [name] PPI:[id number] DOB: March 22, 1928 DOA: 04/22/2023  PCP: Sharee Holster, NP   Patient coming from: [facility]   I have personally briefly reviewed patient's old medical records in [facility] Link  Chief Complaint: Hip fracture  HPI: [name] is a 89 y.o. female with medical history significant for dementia, hypertension, hyperthyroidism, respiratory failure on 2 L. Patient was brought to the ED from [facility] reports of hip fracture.  Per nursing home notes, on Saturday- 3/15, patient was getting out of bed with lift, per notes she swung her arms out causing a skin tear on her left forearm, there was no indication of pain at that time, this morning she was complaining of left hip pain. Left lower extremity shortening and external rotation was noted hence x-ray was done which showed femur fracture. At time of my evaluation, patient's son Tammy Sours is at bedside, daughter and granddaughter Lurena Joiner - Charity fundraiser, who works here at [employer] in radiology imaging department are at bedside. Patient has advanced dementia, is not ambulatory, is able to recognize family, but not able to hold conversation.  ED Course: Temperature 98.3.  Heart rate 93-108.  Respiratory rate 18-20.  Blood pressure systolic 114-125.  O2 sats greater than 95% on room air. Pelvic x-ray shows proximal left femoral shaft fracture with varus angulation and displacement. EDP talked to Dr. Romeo Apple who will see in consult.  Review of Systems: As per HPI all other systems reviewed and negative.  Past Medical History:  Diagnosis Date   Anxiety    Dementia (HCC)    GERD (gastroesophageal reflux disease)    Goiter    Hypertension    Major depression, recurrent, chronic (HCC) 02/22/2019   Osteoarthritis    Osteoporosis    Vitamin D insufficiency     Past Surgical History:  Procedure Laterality Date   APPENDECTOMY     KIDNEY STONE SURGERY     ORIF HIP FRACTURE Right 11/20/2012    Procedure: OPEN REDUCTION INTERNAL FIXATION RIGHT HIP;  Surgeon: Darreld Mclean, MD;  Location: [facility];  Service: Orthopedics;  Laterality: Right;   stress fractures of legs     TUBAL LIGATION       reports that she has never smoked. She has never used smokeless tobacco. She reports that she does not drink alcohol and does not use drugs.  Allergies  Allergen Reactions   Hydrocodone-Acetaminophen Nausea And Vomiting    Family History  Problem Relation Age of Onset   Heart attack Father    Heart disease Neg Hx     Prior to Admission medications   Medication Sig Start Date End Date Taking? Authorizing Provider  acetaminophen (TYLENOL) 325 MG tablet Take 650 mg by mouth 2 (two) times daily. 10 am and 10 pm    [provider]  [facility]) OINT Apply 1 Application topically.    [provider]  methimazole (TAPAZOLE) 5 MG tablet Take 5 mg by mouth daily.    [provider]  methocarbamol (ROBAXIN) 500 MG tablet Take 0.5 tablets (250 mg total) by mouth every 8 (eight) hours as needed for muscle spasms. 06/01/22   Fargo, Amy E, NP  mirtazapine (REMERON) 7.5 MG tablet Take 7.5 mg by mouth at bedtime.    [provider]  NON FORMULARY Diet:Dysphagia 3 (mechanical soft), thin liquids. OK to have soft sandwiches per LPN request. 0/63/01   [provider]  Nutritional Supplements (ENSURE ENLIVE  PO) Take 1 Can by mouth daily.    [provider]  OXYGEN Inhale 2 L into the lungs continuous.    [provider]  sennosides-docusate sodium (SENOKOT-S) 8.6-50 MG tablet Take 1 tablet by mouth daily.    [provider]    Physical Exam: Vitals:   04/22/23 1445 04/22/23 1455 04/22/23 1546 04/22/23 1700  BP:  131/67 (!) 125/58 114/64  Pulse:  (!) 101 93 98  Resp:  20 19 18   Temp:      TempSrc:      SpO2:  98% 97% 100%  Weight: 48 kg     Height: 4' 10.5" (1.486 m)       Constitutional: NAD, calm,  comfortable Vitals:   04/22/23 1445 04/22/23 1455 04/22/23 1546 04/22/23 1700  BP:  131/67 (!) 125/58 114/64  Pulse:  (!) 101 93 98  Resp:  20 19 18   Temp:      TempSrc:      SpO2:  98% 97% 100%  Weight: 48 kg     Height: 4' 10.5" (1.486 m)      Eyes: PERRL, lids and conjunctivae normal ENMT: Mucous membranes are dry Neck: normal, supple, no masses, no thyromegaly, obvious chronic unchanged goiter swelling to mid lower neck,  Respiratory: clear to auscultation bilaterally, no wheezing, no crackles. Normal respiratory effort. No accessory muscle use.  Cardiovascular: Regular rate and rhythm, no murmurs / rubs / gallops. No extremity edema.  Extremities warm. Abdomen: no tenderness, no masses palpated. No hepatosplenomegaly. Bowel sounds positive.  Musculoskeletal: Arthritic changes with mild deformity to fingers of hand,  no clubbing / cyanosis. No joint deformity upper and lower extremities. Skin: Patient sustained a tear to the dorsal surface of her left hand, with bruising, no signs of infection, does not appear to need stitches.  No induration Neurologic: No facial asymmetry, moving extremities spontaneously, speech sometimes hard to understand Psychiatric: Awake and alert, able to answer a few simple questions, requires repetition of questions, appropriate mood.    Labs on Admission: I have personally reviewed following labs and imaging studies  CBC: Recent Labs  Lab 04/22/23 1440  WBC 10.8*  HGB 10.9*  HCT 34.4*  MCV 92.5  PLT 366   Basic Metabolic Panel: Recent Labs  Lab 04/22/23 1440  NA 141  K 3.9  CL 105  CO2 25  GLUCOSE 212*  BUN 27*  CREATININE 0.72  CALCIUM 8.9   Urine analysis:    Component Value Date/Time   COLORURINE YELLOW 08/21/2021 1500   APPEARANCEUR CLOUDY (A) 08/21/2021 1500   LABSPEC 1.010 08/21/2021 1500   PHURINE 7.0 08/21/2021 1500   GLUCOSEU NEGATIVE 08/21/2021 1500   HGBUR MODERATE (A) 08/21/2021 1500   BILIRUBINUR NEGATIVE  08/21/2021 1500   KETONESUR NEGATIVE 08/21/2021 1500   PROTEINUR 30 (A) 08/21/2021 1500   UROBILINOGEN 0.2 11/05/2014 2333   NITRITE NEGATIVE 08/21/2021 1500   LEUKOCYTESUR LARGE (A) 08/21/2021 1500    Radiological Exams on Admission: DG Chest 1 View Result Date: 04/22/2023 CLINICAL DATA:  Fall today. EXAM: CHEST  1 VIEW COMPARISON:  February 11, 2019. FINDINGS: The heart size and mediastinal contours are within normal limits. Both lungs are clear. The visualized skeletal structures are unremarkable. IMPRESSION: No active disease. Electronically Signed   By: Lupita Raider M.D.   On: 04/22/2023 17:13   DG HIP UNILAT W OR W/O PELVIS 2-3 VIEWS LEFT Result Date: 04/22/2023 CLINICAL DATA:  Fall, left hip pain EXAM: DG HIP (WITH  OR WITHOUT PELVIS) 2-3V LEFT COMPARISON:  07/18/2017 FINDINGS: There is a left proximal femoral fracture which appears to be just below the intertrochanteric region in the proximal shaft. Varus angulation and displacement. No subluxation or dislocation. Remote posttraumatic and postsurgical changes in the right femur. Degenerative changes in the hips bilaterally, right greater than left. IMPRESSION: Proximal left femoral shaft fracture with varus angulation and displacement. Electronically Signed   By: Charlett Nose M.D.   On: 04/22/2023 17:13   EKG: Independently reviewed.  Sinus rhythm, rate 98, QTc 446.  No significant change from prior.  Assessment/Plan Principal Problem:   Closed left hip fracture (HCC) Active Problems:   HTN (hypertension)   Goiter   Dementia (HCC)   Chronic respiratory failure with hypoxia (HCC)   Assessment and Plan: * Closed left hip fracture (HCC) X-ray shows proximal left femoral shaft fracture with varus angulation and displacement.  Patient is nonambulatory at baseline.  Has advanced dementia.  Also with bruising to left hand -Dr. Romeo Apple to see in consult -IV morphine 2 mg every 4 hourly as needed -X-ray of left hand -mildly  tachycardic with dry mucous membranes, LR 500 bolus, continue , 75cc/hr x 20hrs  Chronic respiratory failure with hypoxia (HCC) On 2 L chronically.  Chest x-ray negative for acute abnormality.  Dementia (HCC) Advanced vascular dementia, nursing home resident, needs assistance for all ADLs.  Not ambulatory.  Goiter Recent TSH 03/25/2023 -2.9.  T3 and T4 within normal limits. - Resume methimazole pending med reconciliation.   HTN (hypertension) Stable.  Not on antihypertensives.   DVT prophylaxis: SCDs pending Ortho eval Code Status: DNR-Universal DNR form at bedside, confirmed DVT with family at bedside.  Family Communication: Son Tammy Sours at bedside, daughter, and granddaughter Lurena Joiner who is an in radiology department, all at bedside. Disposition Plan: ~ 2 days Consults called: Ortho  Admission status:  Inpt Med surg I certify that at the point of admission it is my clinical judgment that the patient will require inpatient hospital care spanning beyond 2 midnights from the point of admission due to high intensity of service, high risk for further deterioration and high frequency of surveillance required.   Author: Onnie Boer, MD 04/22/2023 7:26 PM  For on call review [email or web address].

## 2023-04-22 NOTE — Assessment & Plan Note (Signed)
Stable.  Not on antihypertensives.

## 2023-04-22 NOTE — ED Triage Notes (Addendum)
 Pt arrived via [facility] from [facility] following a fall earlier today. Per note in Pts chart from facility, Pt has shortening and external rotation of LLE associated with closed, displaced proximal femur fracture.

## 2023-04-22 NOTE — H&P (View-Only) (Signed)
 Reason for Consult: Fracture left hip Referring Physician: Lorenda Ishihara  [name] is an 89 y.o. female.  HPI: This is a 89 year old female who is now nonambulatory and has been so for approximately 4 to 5 years status post open treatment internal fixation right hip with dynamic hip screw for intertrochanteric fracture back in 2014 but now resides at the local nursing home and is bed to chair.  The patient was found with an externally rotated shortened limb which was an unwitnessed fall according to the family members.  There was some type of remote minor trauma to the hand which may have occurred at the same time as a hip fracture.  However today the limb was noted to be in the abnormal position the patient was brought in by EMS and was found to have a severely comminuted proximal peritrochanteric hip fracture  I have spoken with family members we have discussed this among themselves and have decided to proceed with surgical intervention knowing the risks and complications associated with that.  However they would rather deal with those potential risks and complications versus not doing surgery and the patient's potential demise.    Past Medical History:  Diagnosis Date   Anxiety    Dementia (HCC)    GERD (gastroesophageal reflux disease)    Goiter    Hypertension    Major depression, recurrent, chronic (HCC) 02/22/2019   Osteoarthritis    Osteoporosis    Vitamin D insufficiency     Past Surgical History:  Procedure Laterality Date   APPENDECTOMY     KIDNEY STONE SURGERY     ORIF HIP FRACTURE Right 11/20/2012   Procedure: OPEN REDUCTION INTERNAL FIXATION RIGHT HIP;  Surgeon: Darreld Mclean, MD;  Location: [facility];  Service: Orthopedics;  Laterality: Right;   stress fractures of legs     TUBAL LIGATION      Family History  Problem Relation Age of Onset   Heart attack Father    Heart disease Neg Hx     Social History:  reports that she has never smoked. She has never used  smokeless tobacco. She reports that she does not drink alcohol and does not use drugs.  Allergies:  Allergies  Allergen Reactions   Hydrocodone-Acetaminophen Nausea And Vomiting    Medications: I have reviewed the patient's current medications.  Results for orders placed or performed during the hospital encounter of 04/22/23 (from the past 48 hours)  CBC     Status: Abnormal   Collection Time: 04/22/23  2:40 PM  Result Value Ref Range   WBC 10.8 (H) 4.0 - 10.5 K/uL   RBC 3.72 (L) 3.87 - 5.11 MIL/uL   Hemoglobin 10.9 (L) 12.0 - 15.0 g/dL   HCT 16.1 (L) 09.6 - 04.5 %   MCV 92.5 80.0 - 100.0 fL   MCH 29.3 26.0 - 34.0 pg   MCHC 31.7 30.0 - 36.0 g/dL   RDW 40.9 81.1 - 91.4 %   Platelets 366 150 - 400 K/uL   nRBC 0.0 0.0 - 0.2 %    Comment: Performed at [facility], [address] [place], [place] [address]  Basic metabolic panel     Status: Abnormal   Collection Time: 04/22/23  2:40 PM  Result Value Ref Range   Sodium 141 135 - 145 mmol/L   Potassium 3.9 3.5 - 5.1 mmol/L   Chloride 105 98 - 111 mmol/L   CO2 25 22 - 32 mmol/L   Glucose, Bld 212 (H) 70 -  99 mg/dL    Comment: Glucose reference range applies only to samples taken after fasting for at least 8 hours.   BUN 27 (H) 8 - 23 mg/dL   Creatinine, Ser 0.16 0.44 - 1.00 mg/dL   Calcium 8.9 8.9 - 01.0 mg/dL   GFR, Estimated >93 >23 mL/min    Comment: (NOTE) Calculated using the CKD-EPI Creatinine Equation (2021)    Anion gap 11 5 - 15    Comment: Performed at [facility], [address] [place], [place] [address]   Report reviewed no active disease DG Chest 1 View Result Date: 04/22/2023 CLINICAL DATA:  Fall today. EXAM: CHEST  1 VIEW COMPARISON:  February 11, 2019. FINDINGS: The heart size and mediastinal contours are within normal limits. Both lungs are clear. The visualized skeletal structures are unremarkable. IMPRESSION: No active disease. Electronically Signed   By: Lupita Raider M.D.   On: 04/22/2023 17:13   I  reviewed this hip x-ray this is an extremely comminuted fracture poor lateral.  Will have to get additional imaging intraoperatively to assess the lateral view.   DG HIP UNILAT W OR W/O PELVIS 2-3 VIEWS LEFT Result Date: 04/22/2023 CLINICAL DATA:  Fall, left hip pain EXAM: DG HIP (WITH OR WITHOUT PELVIS) 2-3V LEFT COMPARISON:  07/18/2017 FINDINGS: There is a left proximal femoral fracture which appears to be just below the intertrochanteric region in the proximal shaft. Varus angulation and displacement. No subluxation or dislocation. Remote posttraumatic and postsurgical changes in the right femur. Degenerative changes in the hips bilaterally, right greater than left. IMPRESSION: Proximal left femoral shaft fracture with varus angulation and displacement. Electronically Signed   By: Charlett Nose M.D.   On: 04/22/2023 17:13    Review of Systems patient has dementia Blood pressure 117/78, pulse (!) 108, temperature 98.6 F (37 C), temperature source Oral, resp. rate 18, height 4' 10.5" (1.486 m), weight 48.1 kg, SpO2 95%. Physical Exam Vitals and nursing note reviewed.  Constitutional:      General: She is not in acute distress.    Appearance: Normal appearance. She is underweight. She is not ill-appearing, toxic-appearing or diaphoretic.  HENT:     Head: Normocephalic and atraumatic.     Right Ear: External ear normal.     Left Ear: External ear normal.     Nose: Nose normal. No congestion or rhinorrhea.     Mouth/Throat:     Mouth: Mucous membranes are moist.     Pharynx: No oropharyngeal exudate or posterior oropharyngeal erythema.  Eyes:     General: No scleral icterus.       Right eye: No discharge.        Left eye: No discharge.     Extraocular Movements: Extraocular movements intact.     Conjunctiva/sclera: Conjunctivae normal.     Pupils: Pupils are equal, round, and reactive to light.  Cardiovascular:     Rate and Rhythm: Normal rate and regular rhythm.     Heart sounds:  Normal heart sounds.  Pulmonary:     Effort: Pulmonary effort is normal. No respiratory distress.     Breath sounds: Normal breath sounds. No stridor.  Chest:     Chest wall: No tenderness.  Abdominal:     General: Abdomen is flat. Bowel sounds are normal. There is no distension.     Palpations: Abdomen is soft. There is no mass.     Tenderness: There is no abdominal tenderness.  Musculoskeletal:     Cervical back:  Normal range of motion and neck supple. No rigidity or tenderness.     Comments: Left extremity shortened and externally rotated.  Pain with any movement or palpation.  Neurovascular intact.  Skin  intact  The other 3 extremities have no contractured or subluxation atrophy tremor or tenderness skin intact  Lymphadenopathy:     Cervical: No cervical adenopathy.  Skin:    General: Skin is warm and dry.     Capillary Refill: Capillary refill takes less than 2 seconds.  Neurological:     General: No focal deficit present.     Mental Status: She is alert and oriented to person, place, and time.     Cranial Nerves: No cranial nerve deficit.     Sensory: No sensory deficit.     Motor: No weakness.     Gait: Gait abnormal.     Deep Tendon Reflexes: Reflexes normal.  Psychiatric:        Mood and Affect: Mood normal.        Behavior: Behavior normal.        Thought Content: Thought content normal.        Judgment: Judgment normal.     Assessment/Plan: 89 year old female with extremely comminuted peritrochanteric fracture left hip.  The fracture is known to have high complication rate  Although the patient is nonambulatory the patient's family has decided to proceed with surgical intervention understanding the risks and benefits of the surgery and nonsurgical treatment.  Plan is for open treatment internal fixation left hip with intramedullary nailing  [name] 04/22/2023, 7:02 PM

## 2023-04-22 NOTE — Progress Notes (Signed)
 Location:  [facility] Room Number: 114 Place of Service:  SNF (31)   CODE STATUS: dnr   Allergies  Allergen Reactions   Hydrocodone-Acetaminophen Nausea And Vomiting    Chief Complaint  Patient presents with   Acute Visit    Left hip pain     HPI:  She was getting out of bed Saturday with lift; did swing her hands out; causing a skin tear on her left forearm. There were no further indications of pain present at that time. This AM did have complaints of pain in her left hip. There was some external rotation and shortening of the left leg. An x-ray was done and demonstrated a femur fracture. She does have severe osteoporosis with a t score of -5.527. she is likely to fracture with little stress.   Past Medical History:  Diagnosis Date   Anxiety    Dementia (HCC)    GERD (gastroesophageal reflux disease)    Goiter    Hypertension    Major depression, recurrent, chronic (HCC) 02/22/2019   Osteoarthritis    Osteoporosis    Vitamin D insufficiency     Past Surgical History:  Procedure Laterality Date   APPENDECTOMY     KIDNEY STONE SURGERY     ORIF HIP FRACTURE Right 11/20/2012   Procedure: OPEN REDUCTION INTERNAL FIXATION RIGHT HIP;  Surgeon: Darreld Mclean, MD;  Location: [facility];  Service: Orthopedics;  Laterality: Right;   stress fractures of legs     TUBAL LIGATION      Social History   Socioeconomic History   Marital status: Widowed    Spouse name: Not on file   Number of children: Not on file   Years of education: Not on file   Highest education level: Not on file  Occupational History   Occupation: retired  Tobacco Use   Smoking status: Never   Smokeless tobacco: Never  Vaping Use   Vaping status: Never Used  Substance and Sexual Activity   Alcohol use: No   Drug use: No   Sexual activity: Not Currently  Other Topics Concern   Not on file  Social History Narrative   Long term resident of [facility]    Social Drivers of Health    Financial Resource Strain: Not on file  Food Insecurity: Not on file  Transportation Needs: Not on file  Physical Activity: Not on file  Stress: Not on file  Social Connections: Not on file  Intimate Partner Violence: Not on file   Family History  Problem Relation Age of Onset   Heart attack Father    Heart disease Neg Hx       VITAL SIGNS BP 108/60   Pulse 88   Temp 98.6 F (37 C)   Resp (!) 24   Ht 4' 10.5" (1.486 m)   Wt 106 lb (48.1 kg)   SpO2 96%   BMI 21.78 kg/m   Outpatient Encounter Medications as of 04/22/2023  Medication Sig   acetaminophen (TYLENOL) 325 MG tablet Take 650 mg by mouth 2 (two) times daily. 10 am and 10 pm   Balsam [place]-Castor Oil (VENELEX) OINT Apply 1 Application topically.   methimazole (TAPAZOLE) 5 MG tablet Take 5 mg by mouth daily.   methocarbamol (ROBAXIN) 500 MG tablet Take 0.5 tablets (250 mg total) by mouth every 8 (eight) hours as needed for muscle spasms.   mirtazapine (REMERON) 7.5 MG tablet Take 7.5 mg by mouth at bedtime.   NON FORMULARY  Diet:Dysphagia 3 (mechanical soft), thin liquids. OK to have soft sandwiches per LPN request.   Nutritional Supplements (ENSURE ENLIVE PO) Take 1 Can by mouth daily.   OXYGEN Inhale 2 L into the lungs continuous.   sennosides-docusate sodium (SENOKOT-S) 8.6-50 MG tablet Take 1 tablet by mouth daily.   No facility-administered encounter medications on file as of 04/22/2023.     SIGNIFICANT DIAGNOSTIC EXAMS  PREVIOUS  09-20-21: dexa: t score: -5.527  05-31-22: right knee x-ray:  No definite radiographic evidence of acuate fracture or dislocation Mild joint effusion Mild degree of osteopenia Moderate osteoarthritis  05-31-22: right hip x-ray:  No radiographic evidence of acute fracture or dislocation S/p right ORIF with intramedullary rod and screw fixation of a healed intertrochanteric fracture with neal anatomic alignment.     06-10-22: right lower extremity venous doppler No DVT right  lower extremity   NO NEW EXAMS    LABS REVIEWED PREVIOUS     05-09-22: wbc 7.3; hgb 11.8; hct 38.2 plt 95.5 plt 181; glucose 120; bun 21; creat 0.73; k+ 4.3; na++ 141; ca 9.0; gfr >60; protein 7.2 albumin 3.1 tsh 0.221 vitamin B 12: 314 mag 2.5 06-20-22: protein 6.6; albumin 3.0 08-27-22: tsh 2.590 free t4: 0.81 free t3: 2.5  12-21-22: protein 6.1 albumin 2.1 01-23-23: wbc 5.6; hgb 11.7; hct 37.3; mcv 91.9 plt 225; glucose 98; bun 14; creat 0.61; k+ 3.8; na++ 137; ca 8.7; gfr >60; protein 6.5 albumin 3.2    NO NEW LABS.     Review of Systems  Unable to perform ROS: Dementia   Physical Exam Constitutional:      General: She is not in acute distress.    Appearance: She is well-developed. She is not diaphoretic.  Neck:     Thyroid: Thyroid mass present. No thyromegaly.  Cardiovascular:     Rate and Rhythm: Normal rate and regular rhythm.     Pulses: Normal pulses.     Heart sounds: Normal heart sounds.  Pulmonary:     Effort: Pulmonary effort is normal. No respiratory distress.     Breath sounds: Normal breath sounds.  Abdominal:     General: Bowel sounds are normal. There is no distension.     Palpations: Abdomen is soft.     Tenderness: There is no abdominal tenderness.  Musculoskeletal:     Cervical back: Neck supple.     Right lower leg: No edema.     Left lower leg: No edema.     Comments: Left leg externally rotated and shortened   Lymphadenopathy:     Cervical: No cervical adenopathy.  Skin:    General: Skin is warm and dry.  Neurological:     Mental Status: She is alert. Mental status is at baseline.  Psychiatric:        Mood and Affect: Mood normal.      ASSESSMENT/ PLAN:  TODAY  Left femur fracture: will send her to the ED for further workup and treatment options.    [name] NP [facility]  call [phone]

## 2023-04-22 NOTE — Assessment & Plan Note (Signed)
 On 2 L chronically.  Chest x-ray negative for acute abnormality.

## 2023-04-22 NOTE — Assessment & Plan Note (Addendum)
 Recent TSH 03/25/2023 -2.9.  T3 and T4 within normal limits. - Resume methimazole pending med reconciliation.

## 2023-04-22 NOTE — ED Notes (Signed)
 Patient transported to X-ray

## 2023-04-22 NOTE — ED Provider Notes (Addendum)
 [place] EMERGENCY DEPARTMENT AT [facility] Provider Note   CSN: [id number] Arrival date & time: 04/22/23  1429     History  Chief Complaint  Patient presents with   Marletta Lor    [name] is a 89 y.o. female.  Pt from SNF via EMS, with reported possible recent fall with left hip/proximal femur fracture. Pt limited historian - level 5 caveat. Skin tear to left hand that has steri-strips in place. Tetanus is up to date. Pt denies other pain.   The history is provided by the patient, medical records and the EMS personnel. The history is limited by the condition of the patient.  Fall Pertinent negatives include no chest pain and no headaches.       Home Medications Prior to Admission medications   Medication Sig Start Date End Date Taking? Authorizing Provider  acetaminophen (TYLENOL) 325 MG tablet Take 650 mg by mouth 2 (two) times daily. 10 am and 10 pm    [provider]  Despina [facility]) OINT Apply 1 Application topically.    [provider]  methimazole (TAPAZOLE) 5 MG tablet Take 5 mg by mouth daily.    [provider]  methocarbamol (ROBAXIN) 500 MG tablet Take 0.5 tablets (250 mg total) by mouth every 8 (eight) hours as needed for muscle spasms. 06/01/22   Fargo, Amy E, NP  mirtazapine (REMERON) 7.5 MG tablet Take 7.5 mg by mouth at bedtime.    [provider]  NON FORMULARY Diet:Dysphagia 3 (mechanical soft), thin liquids. OK to have soft sandwiches per LPN request. 2/53/66   [provider]  Nutritional Supplements (ENSURE ENLIVE PO) Take 1 Can by mouth daily.    [provider]  OXYGEN Inhale 2 L into the lungs continuous.    [provider]  sennosides-docusate sodium (SENOKOT-S) 8.6-50 MG tablet Take 1 tablet by mouth daily.    [provider]      Allergies    Hydrocodone-acetaminophen    Review of Systems   Review of Systems  Constitutional:  Negative for fever.   Cardiovascular:  Negative for chest pain.  Musculoskeletal:        Left proximal femur/hip injury.   Skin:  Positive for wound.  Neurological:  Negative for headaches.    Physical Exam Updated Vital Signs BP 131/67   Pulse (!) 101   Temp 98.3 F (36.8 C) (Oral)   Resp 20   Ht 1.486 m (4' 10.5")   Wt 48 kg   SpO2 98%   BMI 21.74 kg/m  Physical Exam Vitals and nursing note reviewed.  Constitutional:      Appearance: Normal appearance. She is well-developed.  HENT:     Head: Atraumatic.     Nose: Nose normal.     Mouth/Throat:     Mouth: Mucous membranes are moist.  Eyes:     General: No scleral icterus.    Conjunctiva/sclera: Conjunctivae normal.     Pupils: Pupils are equal, round, and reactive to light.  Neck:     Trachea: No tracheal deviation.  Cardiovascular:     Rate and Rhythm: Normal rate and regular rhythm.     Pulses: Normal pulses.     Heart sounds: Normal heart sounds. No murmur heard.    No friction rub. No gallop.  Pulmonary:     Effort: Pulmonary effort is normal. No respiratory distress.     Breath sounds: Normal breath sounds.  Chest:  Chest wall: No tenderness.  Abdominal:     General: There is no distension.     Palpations: Abdomen is soft.     Tenderness: There is no abdominal tenderness.  Musculoskeletal:     Cervical back: Normal range of motion and neck supple. No rigidity or tenderness. No muscular tenderness.     Comments: LLE shortened, rotated. Swelling to proximal left thigh. Distal pulses palp. Otherwise good passive rom extremities without pain or focal bony tenderness. Skin tear to dorsum left hand without sign of infection. CTLS spine, non tender, aligned, no step off.   Skin:    General: Skin is warm and dry.     Findings: No rash.  Neurological:     Mental Status: She is alert.     Comments: Alert, speech normal. Motor/sens grossly intact bil.   Psychiatric:        Mood and Affect: Mood normal.     ED Results /  Procedures / Treatments   Labs (all labs ordered are listed, but only abnormal results are displayed) Results for orders placed or performed during the hospital encounter of 04/22/23  CBC   Collection Time: 04/22/23  2:40 PM  Result Value Ref Range   WBC 10.8 (H) 4.0 - 10.5 K/uL   RBC 3.72 (L) 3.87 - 5.11 MIL/uL   Hemoglobin 10.9 (L) 12.0 - 15.0 g/dL   HCT 24.4 (L) 01.0 - 27.2 %   MCV 92.5 80.0 - 100.0 fL   MCH 29.3 26.0 - 34.0 pg   MCHC 31.7 30.0 - 36.0 g/dL   RDW 53.6 64.4 - 03.4 %   Platelets 366 150 - 400 K/uL   nRBC 0.0 0.0 - 0.2 %  Basic metabolic panel   Collection Time: 04/22/23  2:40 PM  Result Value Ref Range   Sodium 141 135 - 145 mmol/L   Potassium 3.9 3.5 - 5.1 mmol/L   Chloride 105 98 - 111 mmol/L   CO2 25 22 - 32 mmol/L   Glucose, Bld 212 (H) 70 - 99 mg/dL   BUN 27 (H) 8 - 23 mg/dL   Creatinine, Ser 7.42 0.44 - 1.00 mg/dL   Calcium 8.9 8.9 - 59.5 mg/dL   GFR, Estimated >63 >87 mL/min   Anion gap 11 5 - 15     EKG EKG Interpretation Date/Time:  Tuesday April 22 2023 14:57:05 EDT Ventricular Rate:  98 PR Interval:  140 QRS Duration:  111 QT Interval:  349 QTC Calculation: 446 R Axis:   -59  Text Interpretation: Sinus rhythm Incomplete RBBB and LAFB Low voltage, precordial leads Abnormal R-wave progression, early transition Confirmed by Anders Simmonds [phone]) on 04/22/2023 3:09:54 PM  Radiology Subtroch hip fx.   Procedures Procedures    Medications Ordered in ED Medications  morphine (PF) 2 MG/ML injection 2 mg (2 mg Intravenous Given 04/22/23 1501)    ED Course/ Medical Decision Making/ A&P                                 Medical Decision Making Problems Addressed: Accidental fall, initial encounter: acute illness or injury with systemic symptoms that poses a threat to life or bodily functions Closed subtrochanteric fracture of hip, left, initial encounter [facility]): acute illness or injury with systemic symptoms that poses a threat to life or  bodily functions Skin tear of left hand without complication, initial encounter: acute illness or injury  Amount and/or Complexity of  Data Reviewed Independent Historian: EMS    Details: hx External Data Reviewed: notes. Labs: ordered. Decision-making details documented in ED Course. Radiology: ordered and independent interpretation performed. Decision-making details documented in ED Course. ECG/medicine tests: ordered and independent interpretation performed. Decision-making details documented in ED Course. Discussion of management or test interpretation with external provider(s): Ortho, medicine  Risk Prescription drug management. Parenteral controlled substances. Decision regarding hospitalization.   Iv ns. Continuous pulse ox and cardiac monitoring. Labs ordered/sent. Imaging ordered.   Differential diagnosis includes hip/femur fracture, contusion, etc. Dispo decision including potential need for admission considered - will get labs and imaging and reassess.   Reviewed nursing notes and prior charts for additional history. External reports reviewed. Additional history from: EMS.   Morphine iv.   Cardiac monitor: sinus rhythm, rate 80.  Labs reviewed/interpreted by me - wbc and hgb 11. Chem unremarkable.   Xrays reviewed/interpreted by me - left subtroch fx.   Ortho consulted. Discussed pt with Dr Romeo Apple - he indicates he will see and have discussion w pt/family before making specific treatment plan.   Medicine consulted for admission. Discussed pt - will admit.   Recheck pain improved.           Final Clinical Impression(s) / ED Diagnoses Final diagnoses:  Accidental fall, initial encounter  Closed subtrochanteric fracture of hip, left, initial encounter [facility])  Skin tear of left hand without complication, initial encounter    Rx / DC Orders ED Discharge Orders     None         Cathren Laine, MD 04/22/23 1605

## 2023-04-22 NOTE — Assessment & Plan Note (Deleted)
 X-ray shows proximal left femoral shaft fracture with varus angulation and displacement.  Patient is nonambulatory at baseline.  Has advanced dementia.  Also with bruising to left hand -Dr. Romeo Apple to see in consult -IV morphine 2 mg every 4 hourly as needed -X-ray of left hand - Dry mucous membranes, LR 75cc/hr x 20hrs

## 2023-04-22 NOTE — Assessment & Plan Note (Addendum)
 Advanced vascular dementia, nursing home resident, needs assistance for all ADLs.  Not ambulatory.  She takes a pured diet with thin liquids.

## 2023-04-22 NOTE — Assessment & Plan Note (Addendum)
 X-ray shows proximal left femoral shaft fracture with varus angulation and displacement.  Patient is nonambulatory at baseline.  Has advanced dementia.  Also with bruising to left hand -Dr. Romeo Apple to see in consult -IV morphine 2 mg every 4 hourly as needed -X-ray of left hand -mildly tachycardic with dry mucous membranes, LR 500 bolus, continue , 75cc/hr x 20hrs

## 2023-04-23 ENCOUNTER — Inpatient Hospital Stay (HOSPITAL_COMMUNITY)

## 2023-04-23 ENCOUNTER — Encounter (HOSPITAL_COMMUNITY)
Admission: EM | Disposition: A | Discharge status: Skilled Nursing Facility | Payer: Self-pay | Source: Skilled Nursing Facility | Attending: Internal Medicine

## 2023-04-23 ENCOUNTER — Other Ambulatory Visit: Payer: Self-pay

## 2023-04-23 ENCOUNTER — Encounter (HOSPITAL_COMMUNITY): Payer: Self-pay | Admitting: Internal Medicine

## 2023-04-23 DIAGNOSIS — F418 Other specified anxiety disorders: Secondary | ICD-10-CM

## 2023-04-23 DIAGNOSIS — S72002A Fracture of unspecified part of neck of left femur, initial encounter for closed fracture: Secondary | ICD-10-CM | POA: Diagnosis not present

## 2023-04-23 DIAGNOSIS — J449 Chronic obstructive pulmonary disease, unspecified: Secondary | ICD-10-CM

## 2023-04-23 DIAGNOSIS — I10 Essential (primary) hypertension: Secondary | ICD-10-CM | POA: Diagnosis not present

## 2023-04-23 DIAGNOSIS — S7222XA Displaced subtrochanteric fracture of left femur, initial encounter for closed fracture: Secondary | ICD-10-CM

## 2023-04-23 DIAGNOSIS — S72102A Unspecified trochanteric fracture of left femur, initial encounter for closed fracture: Secondary | ICD-10-CM

## 2023-04-23 DIAGNOSIS — M9702XA Periprosthetic fracture around internal prosthetic left hip joint, initial encounter: Secondary | ICD-10-CM

## 2023-04-23 HISTORY — PX: INTRAMEDULLARY (IM) NAIL INTERTROCHANTERIC: SHX5875

## 2023-04-23 SURGERY — FIXATION, FRACTURE, INTERTROCHANTERIC, WITH INTRAMEDULLARY ROD
Anesthesia: Spinal | Site: Hip | Laterality: Left

## 2023-04-23 MED ORDER — CHLORHEXIDINE GLUCONATE 0.12 % MT SOLN: 15.0000 mL | Freq: Once | OROMUCOSAL | Status: AC

## 2023-04-23 MED ORDER — LACTATED RINGERS IV SOLN: INTRAVENOUS | Status: DC

## 2023-04-23 MED ORDER — PROPOFOL 10 MG/ML IV BOLUS
INTRAVENOUS | Status: DC | PRN
  Administered 2023-04-23 (×2): 20 mg via INTRAVENOUS

## 2023-04-23 MED ORDER — PROPOFOL 500 MG/50ML IV EMUL
INTRAVENOUS | Status: DC | PRN
  Administered 2023-04-23: 70 ug/kg/min via INTRAVENOUS

## 2023-04-23 MED ORDER — FENTANYL CITRATE (PF) 100 MCG/2ML IJ SOLN
INTRAMUSCULAR | Status: DC | PRN
Start: 2023-04-23 — End: 2023-04-23
  Administered 2023-04-23 (×4): 25 ug via INTRAVENOUS

## 2023-04-23 MED ORDER — PHENYLEPHRINE HCL-NACL 20-0.9 MG/250ML-% IV SOLN
INTRAVENOUS | Status: DC | PRN
  Administered 2023-04-23: 50 ug/min via INTRAVENOUS

## 2023-04-23 MED ORDER — CEFAZOLIN SODIUM-DEXTROSE 2-4 GM/100ML-% IV SOLN
2.0000 g | INTRAVENOUS | Status: AC
  Administered 2023-04-23: 2 g via INTRAVENOUS

## 2023-04-23 MED ORDER — DEXAMETHASONE SODIUM PHOSPHATE 10 MG/ML IJ SOLN
INTRAMUSCULAR | Status: DC | PRN
Start: 2023-04-23 — End: 2023-04-23
  Administered 2023-04-23: 10 mg via INTRAVENOUS

## 2023-04-23 MED ORDER — SODIUM CHLORIDE 0.9 % IR SOLN
Status: DC | PRN
  Administered 2023-04-23: 1000 mL

## 2023-04-23 MED ORDER — PHENYLEPHRINE 80 MCG/ML (10ML) SYRINGE FOR IV PUSH (FOR BLOOD PRESSURE SUPPORT)
PREFILLED_SYRINGE | INTRAVENOUS | Status: DC | PRN
  Administered 2023-04-23: 80 ug via INTRAVENOUS

## 2023-04-23 MED ORDER — POVIDONE-IODINE 10 % EX SWAB
2.0000 | Freq: Once | CUTANEOUS | Status: AC
  Administered 2023-04-23: 2 via TOPICAL

## 2023-04-23 MED ORDER — CHLORHEXIDINE GLUCONATE 4 % EX SOLN: 60.0000 mL | Freq: Once | CUTANEOUS | Status: DC

## 2023-04-23 MED ORDER — HYDROMORPHONE HCL 1 MG/ML IJ SOLN
0.2500 mg | INTRAMUSCULAR | Status: DC | PRN
  Administered 2023-04-23 (×2): 0.25 mg via INTRAVENOUS
  Filled 2023-04-23: qty 0.5

## 2023-04-23 MED ORDER — PROPOFOL 500 MG/50ML IV EMUL
INTRAVENOUS | Status: AC
  Filled 2023-04-23: qty 50

## 2023-04-23 MED ORDER — BUPIVACAINE-EPINEPHRINE (PF) 0.5% -1:200000 IJ SOLN
INTRAMUSCULAR | Status: AC
  Filled 2023-04-23: qty 30

## 2023-04-23 MED ORDER — CHLORHEXIDINE GLUCONATE 0.12 % MT SOLN
OROMUCOSAL | Status: AC
  Administered 2023-04-23: 15 mL via OROMUCOSAL
  Filled 2023-04-23: qty 15

## 2023-04-23 MED ORDER — CEFAZOLIN SODIUM-DEXTROSE 2-4 GM/100ML-% IV SOLN
INTRAVENOUS | Status: AC
  Filled 2023-04-23: qty 100

## 2023-04-23 MED ORDER — BUPIVACAINE IN DEXTROSE 0.75-8.25 % IT SOLN
INTRATHECAL | Status: DC | PRN
  Administered 2023-04-23: 1.6 mL via INTRATHECAL

## 2023-04-23 MED ORDER — ORAL CARE MOUTH RINSE: 15.0000 mL | Freq: Once | OROMUCOSAL | Status: AC

## 2023-04-23 MED ORDER — SODIUM CHLORIDE 0.9 % IV SOLN: 12.5000 mg | INTRAVENOUS | Status: DC | PRN

## 2023-04-23 MED ORDER — PHENYLEPHRINE HCL-NACL 20-0.9 MG/250ML-% IV SOLN
INTRAVENOUS | Status: AC
  Filled 2023-04-23: qty 250

## 2023-04-23 MED ORDER — BUPIVACAINE-EPINEPHRINE (PF) 0.5% -1:200000 IJ SOLN
INTRAMUSCULAR | Status: DC | PRN
Start: 2023-04-23 — End: 2023-04-23
  Administered 2023-04-23: 30 mL

## 2023-04-23 MED ORDER — TRANEXAMIC ACID-NACL 1000-0.7 MG/100ML-% IV SOLN
1000.0000 mg | INTRAVENOUS | Status: AC
  Administered 2023-04-23: 1000 mg via INTRAVENOUS
  Filled 2023-04-23: qty 100

## 2023-04-23 MED ORDER — FENTANYL CITRATE (PF) 100 MCG/2ML IJ SOLN
INTRAMUSCULAR | Status: AC
  Filled 2023-04-23: qty 2

## 2023-04-23 MED ORDER — LIDOCAINE HCL (PF) 1 % IJ SOLN
INTRAMUSCULAR | Status: DC | PRN
  Administered 2023-04-23: 30 mg

## 2023-04-23 SURGICAL SUPPLY — 62 items
BIT DRILL 4.0X280 (BIT) IMPLANT
BIT DRILL AO GAMMA 4.2X300 (BIT) ×1 IMPLANT
BLADE SURG SZ10 CARB STEEL (BLADE) ×2 IMPLANT
BNDG GAUZE ELAST 4 BULKY (GAUZE/BANDAGES/DRESSINGS) ×1 IMPLANT
CHLORAPREP W/TINT 26 (MISCELLANEOUS) ×1 IMPLANT
CLOTH BEACON ORANGE TIMEOUT ST (SAFETY) ×1 IMPLANT
COUNTER NDL MAGNETIC 40 RED (SET/KITS/TRAYS/PACK) ×1 IMPLANT
COUNTER NEEDLE MAGNETIC 40 RED (SET/KITS/TRAYS/PACK) ×1 IMPLANT
COVER LIGHT HANDLE STERIS (MISCELLANEOUS) ×2 IMPLANT
COVER MAYO STAND XLG (MISCELLANEOUS) ×1 IMPLANT
DECANTER SPIKE VIAL GLASS SM (MISCELLANEOUS) ×2 IMPLANT
DRAPE C-ARM FOLDED MOBILE STRL (DRAPES) IMPLANT
DRAPE C-ARMOR (DRAPES) IMPLANT
DRAPE STERI IOBAN 125X83 (DRAPES) ×1 IMPLANT
DRSG MEPILEX BORDER 4X12 (GAUZE/BANDAGES/DRESSINGS) ×1 IMPLANT
DRSG MEPILEX POST OP 4X12 (GAUZE/BANDAGES/DRESSINGS) IMPLANT
DRSG MEPILEX SACRM 8.7X9.8 (GAUZE/BANDAGES/DRESSINGS) ×1 IMPLANT
DRSG PAD ABDOMINAL 8X10 ST (GAUZE/BANDAGES/DRESSINGS) ×1 IMPLANT
ELECT REM PT RETURN 9FT ADLT (ELECTROSURGICAL) ×1 IMPLANT
ELECTRODE REM PT RTRN 9FT ADLT (ELECTROSURGICAL) ×1 IMPLANT
GLOVE BIO SURGEON STRL SZ8 (GLOVE) IMPLANT
GLOVE BIOGEL PI IND STRL 7.0 (GLOVE) ×2 IMPLANT
GLOVE BIOGEL PI IND STRL 7.5 (GLOVE) IMPLANT
GLOVE BIOGEL PI IND STRL 8 (GLOVE) IMPLANT
GLOVE SS N UNI LF 8.5 STRL (GLOVE) ×1 IMPLANT
GLOVE SURG POLYISO LF SZ8 (GLOVE) ×1 IMPLANT
GLOVE SURG SS PI 7.5 STRL IVOR (GLOVE) IMPLANT
GOWN STRL REUS W/TWL LRG LVL3 (GOWN DISPOSABLE) ×1 IMPLANT
GOWN STRL REUS W/TWL XL LVL3 (GOWN DISPOSABLE) ×1 IMPLANT
GUIDEROD T2 3X1000 (ROD) ×1 IMPLANT
GUIDEWIRE ORTH 900X3XBALL NOSE (WIRE) IMPLANT
INST SET MAJOR BONE (KITS) ×1 IMPLANT
K-WIRE 3.2X450M STR (WIRE) ×1 IMPLANT
KIT TURNOVER CYSTO (KITS) ×1 IMPLANT
KWIRE 3.2X450M STR (WIRE) ×1 IMPLANT
MANIFOLD NEPTUNE II (INSTRUMENTS) ×1 IMPLANT
MARKER SKIN DUAL TIP RULER LAB (MISCELLANEOUS) ×1 IMPLANT
NAIL LT 11X36X125 TROCHANTERIC (Nail) IMPLANT
NDL HYPO 21X1.5 SAFETY (NEEDLE) ×1 IMPLANT
NDL SPNL 18GX3.5 QUINCKE PK (NEEDLE) ×1 IMPLANT
NEEDLE HYPO 21X1.5 SAFETY (NEEDLE) ×1 IMPLANT
NEEDLE SPNL 18GX3.5 QUINCKE PK (NEEDLE) ×1 IMPLANT
NS IRRIG 1000ML POUR BTL (IV SOLUTION) ×1 IMPLANT
PACK SRG BSC III STRL LF ECLPS (CUSTOM PROCEDURE TRAY) ×1 IMPLANT
PACK TOTAL JOINT (CUSTOM PROCEDURE TRAY) IMPLANT
PAD ARMBOARD POSITIONER FOAM (MISCELLANEOUS) ×1 IMPLANT
PENCIL SMOKE EVACUATOR COATED (MISCELLANEOUS) ×1 IMPLANT
PIN GUIDE THRD AR 3.2X330 (PIN) IMPLANT
POSITIONER HEAD 8X9X4 ADT (SOFTGOODS) ×1 IMPLANT
SCREW CORT CAPTR 5X34 (Screw) IMPLANT
SCREW TELESCOP LAG 10.5X85 (Screw) IMPLANT
SET BASIN LINEN APH (SET/KITS/TRAYS/PACK) ×1 IMPLANT
SPONGE T-LAP 18X18 ~~LOC~~+RFID (SPONGE) ×2 IMPLANT
STAPLER SKIN PROX WIDE 3.9 (STAPLE) IMPLANT
SUT BRALON NAB BRD #1 30IN (SUTURE) ×1 IMPLANT
SUT MNCRL 0 VIOLET CTX 36 (SUTURE) ×1 IMPLANT
SUT MON AB 0 CT1 (SUTURE) IMPLANT
SUT MON AB 2-0 CT1 36 (SUTURE) ×1 IMPLANT
SYR 30ML LL (SYRINGE) ×1 IMPLANT
SYR BULB IRRIG 60ML STRL (SYRINGE) ×2 IMPLANT
TRAY FOLEY MTR SLVR 16FR STAT (SET/KITS/TRAYS/PACK) ×1 IMPLANT
YANKAUER SUCT BULB TIP NO VENT (SUCTIONS) ×1 IMPLANT

## 2023-04-23 NOTE — Progress Notes (Signed)
 X-ray at bedside

## 2023-04-23 NOTE — Interval H&P Note (Signed)
 History and Physical Interval Note:  04/23/2023 2:59 PM  [name]  has presented today for surgery, with the diagnosis of left hip peritrochanteric fracture.  The various methods of treatment have been discussed with the patient and family. After consideration of risks, benefits and other options for treatment, the patient has consented to  Procedure(s) with comments: FIXATION, FRACTURE, INTERTROCHANTERIC, WITH INTRAMEDULLARY ROD (Left) - arthrex nail, poss plate, poss circlage tape as a surgical intervention.  The patient's history has been reviewed, patient examined, no change in status, stable for surgery.  I have reviewed the patient's chart and labs.  Questions were answered to the patient's satisfaction.     Fuller Canada

## 2023-04-23 NOTE — Transfer of Care (Signed)
 Immediate Anesthesia Transfer of Care Note  Patient: [name]  Procedure(s) Performed: FIXATION, FRACTURE, INTERTROCHANTERIC, WITH INTRAMEDULLARY ROD (Left: Hip)  Patient Location: PACU  Anesthesia Type:Spinal  Level of Consciousness: oriented and sedated  Airway & Oxygen Therapy: Patient connected to nasal cannula oxygen  Post-op Assessment: Post -op Vital signs reviewed and stable  Post vital signs: Reviewed  Last Vitals:  Vitals Value Taken Time  BP 115/60 04/23/23 1815  Temp 36 C 04/23/23 1815  Pulse 83 04/23/23 1820  Resp 14 04/23/23 1820  SpO2 98 % 04/23/23 1820  Vitals shown include unfiled device data.  Last Pain:  Vitals:   04/23/23 1437  TempSrc: Axillary  PainSc:          Complications: No notable events documented.

## 2023-04-23 NOTE — Progress Notes (Signed)
 Patient ID: [name], female   DOB: 1929/01/10, 89 y.o.   MRN: [id number]  Preoperative note  Diagnosis intertrochanteric fracture left hip  Plan surgical treatment open treatment internal fixation with intramedullary nailing  CBC     Latest Ref Rng & Units 04/22/2023    2:40 PM 01/23/2023    5:00 AM 05/09/2022    8:00 AM  CBC  WBC 4.0 - 10.5 K/uL 10.8  5.6  7.3   Hemoglobin 12.0 - 15.0 g/dL 30.8  65.7  84.6   Hematocrit 36.0 - 46.0 % 34.4  37.3  38.2   Platelets 150 - 400 K/uL 366  225  181      BASIC metabolic panel    Latest Ref Rng & Units 04/22/2023    2:40 PM 01/23/2023    5:00 AM 05/09/2022    8:00 AM  BMP  Glucose 70 - 99 mg/dL 962  98  952   BUN 8 - 23 mg/dL 27  14  21    Creatinine 0.44 - 1.00 mg/dL 8.41  3.24  4.01   Sodium 135 - 145 mmol/L 141  137  141   Potassium 3.5 - 5.1 mmol/L 3.9  3.8  4.3   Chloride 98 - 111 mmol/L 105  108  104   CO2 22 - 32 mmol/L 25  25  28    Calcium 8.9 - 10.3 mg/dL 8.9  8.7  9.0      BLOOD UNITS AVAILABLE yes  Chest x-ray no active disease  EKG no acute changes  Plain film findings comminuted peritrochanteric fracture left hip extremely  OR NOTIFIED yes  Arthrex rep notified

## 2023-04-23 NOTE — Anesthesia Procedure Notes (Addendum)
 Spinal  Patient location during procedure: OR Start time: 04/23/2023 3:20 PM End time: 04/23/2023 3:26 PM Staffing Performed: resident/CRNA  Resident/CRNA: Modena Slater, RN Performed by: Modena Slater, RN Authorized by: Shanon Payor, CRNA   Spinal Block Patient position: right lateral decubitus Prep: ChloraPrep Patient monitoring: heart rate, cardiac monitor, continuous pulse ox and blood pressure Approach: midline Location: L3-4 Injection technique: single-shot Needle Needle type: Pencan  Needle gauge: 24 G Needle length: 10 cm Catheter size: 20 g Additional Notes EXP 2024-05-04 LOT [id number]

## 2023-04-23 NOTE — Anesthesia Preprocedure Evaluation (Addendum)
 Anesthesia Evaluation  Patient identified by MRN, date of birth, ID band Patient awake    Reviewed: Allergy & Precautions, H&P , NPO status , Patient's Chart, lab work & pertinent test results  Airway Mallampati: I  TM Distance: >3 FB     Dental  (+) Edentulous Upper, Missing Only a few lower teeth left:   Pulmonary shortness of breath, COPD,  oxygen dependent History of chronic respiratory failure with hypoxia but no diagnosis of COPD?  2 L/M O2   breath sounds clear to auscultation       Cardiovascular hypertension, Pt. on medications + angina  Normal cardiovascular exam Rhythm:Regular Rate:Normal     Neuro/Psych  PSYCHIATRIC DISORDERS Anxiety Depression   Dementia    GI/Hepatic ,GERD  ,,  Endo/Other  diabetes, Type 2 Hyperthyroidism   Renal/GU Renal disease     Musculoskeletal  (+) Arthritis , Osteoarthritis,    Abdominal   Peds  Hematology  (+) Blood dyscrasia, anemia Hgb 10.9   Anesthesia Other Findings   Reproductive/Obstetrics                             Anesthesia Physical Anesthesia Plan  ASA: 4  Anesthesia Plan: Spinal   Post-op Pain Management: Dilaudid IV   Induction:   PONV Risk Score and Plan:   Airway Management Planned: Nasal Cannula, Natural Airway and Simple Face Mask  Additional Equipment: None  Intra-op Plan:   Post-operative Plan:   Informed Consent: I have reviewed the patients History and Physical, chart, labs and discussed the procedure including the risks, benefits and alternatives for the proposed anesthesia with the patient or authorized representative who has indicated his/her understanding and acceptance.     Dental advisory given and Consent reviewed with POA  Plan Discussed with: CRNA and Surgeon  Anesthesia Plan Comments:         Anesthesia Quick Evaluation

## 2023-04-23 NOTE — Brief Op Note (Signed)
 04/23/2023  6:18 PM  PATIENT:  [name]  89 y.o. female  PRE-OPERATIVE DIAGNOSIS:  left hip peritrochanteric fracture  POST-OPERATIVE DIAGNOSIS:  left hip peritrochanteric fracture; complication- right femur fracture  PROCEDURE:  Procedure(s) with comments: FIXATION, FRACTURE, INTERTROCHANTERIC, WITH INTRAMEDULLARY ROD (Left) - arthrex nail, poss plate, poss circlage tape  SURGEON:  Surgeons and Role:    Vickki Hearing, MD - Primary    * Oliver Barre, MD - Assisting  PHYSICIAN ASSISTANT:   ASSISTANTS: Dr Dewain Penning    ANESTHESIA:   spinal  EBL:  100 mL   BLOOD ADMINISTERED:none  DRAINS: none   LOCAL MEDICATIONS USED:  MARCAINE     SPECIMEN:  No Specimen  DISPOSITION OF SPECIMEN:  N/A  COUNTS:  YES  TOURNIQUET:  * No tourniquets in log *  DICTATION: .Dragon Dictation  PLAN OF CARE: Admit to inpatient   PATIENT DISPOSITION:  PACU - hemodynamically stable.   Delay start of Pharmacological VTE agent (>24hrs) due to surgical blood loss or risk of bleeding: yes

## 2023-04-23 NOTE — Op Note (Signed)
 Open treatment internal fixation of the hip with CEPHALOMEDULLARY nail  Orthopaedic Surgery Operative Note (CSN: [id number])  [name]  23-Sep-1928 Date of Surgery: 04/23/2023   Diagnoses:  left hip peritrochanteric fracture  Procedure: Open treatment internal fixation left hip with intramedullary nail   TXA [used/not used] yes   Operative Finding Comminuted peritrochanteric fracture with basicervical fracture line greater trochanteric fracture and subsequent fracture  Complication: Fracture left femur.  Left femur fracture during positioning the patient on the fracture table.  The left leg was abducted and fractured.   Post-Op Diagnosis: Same Surgeons:Primary: Vickki Hearing, MD Assisting: Oliver Barre, MD Assistants: Rebeca Alert Location: [facility] OR ROOM 3 Anesthesia: Spinal anesthesia   -antibiotics: Ancef 2 g  Estimated Blood Loss: 200  Specimens: None   Implants: Implant Name Type Inv. Item Serial No. Manufacturer Lot No. LRB No. Used Action  ES Trochanteric Nail, Left, 11mm x 36 cm x 125 Nail   [employer] [id number] Left 1 Implanted  Telescoping Lag Screw, 10.5 x 85 mm Screw   [employer] [id number] Left 1 Implanted  SCREW CORT CAPTR 5X34 - [id number] Screw SCREW CORT CAPTR 5X34  [employer] [id number] Left 1 Implanted    BLOOD ADMINISTERED:none  DRAINS: none   LOCAL MEDICATIONS USED: Marcaine with epinephrine   SPECIMEN:  No Specimen  DISPOSITION OF SPECIMEN:  N/A  COUNTS:  CORRECT   DICTATION: .Dragon Dictation   Surgeon Romeo Apple   PLAN OF CARE: Discharge to home after PACU  PATIENT DISPOSITION:  PACU - hemodynamically stable.   Delay start of Pharmacological VTE agent (>24hrs) due to surgical blood loss or risk of bleeding: yes    The surgery was done as follows:  The patient was seen in the preop area the left hip was marked for the surgical site and confirmed.  Chart review was completed  The patient was taken to the operating room a  Foley catheter was inserted after spinal anesthetic  The patient was placed on the Hana fracture table  In the process of abducting the right leg to gain C arm access to the groin area the right femur fractured.  Imaging studies showed a comminuted spiral fracture of the right femur closed  Initially the C-arm was brought in with the legs scissored however a good lateral x-ray could not be obtained and therefore the right leg was abducted.  When the right leg was abducted and audible pop was noted by the OR team.  Images were taken in the femur was found to be fractured  At this time I made a decision to proceed with the planned procedure as it was the more immediate problem for the patient  C arm images were obtained and first we placed the patient in traction to obtain length followed by internal and external rotation to obtain the best reduction.  This also required pulling on the shaft fragment and pushing up and internally rotating the leg.  We were able to obtain a set this factory reduction     The right lower extremity was prepped and draped.  Drapes were placed proximally using a U-Drape and distally at the knee also used drape.  And then the regular hip fracture drape was placed over the leg without the shower curtain  Using spinal needle to obtain exact location of the bone and incision was made at the greater trochanter extended proximally subcutaneous tissue was divided fascia was split in line with the skin incision blunt dissection was carried  out through the abductors and a guidepin was placed just medial in the called Trochaformis position.  Several imaging studies were performed to ensure adequate starting position.  This was overreamed with a proximal reamer however no reaming was required as the reamer passed through the bone easily.  We took several shots and care to try to maintain the position of the fractured greater trochanter.  Separate incision was made at the  subtrochanteric fracture site and a bone hook was placed in the shaft was pulled laterally and the leg was adducted to assist in the reduction, reduction also required internal rotation and pressure from posterior to anterior.  The guidewire was advanced all the way to the top of the patella and measurements were taken to obtain the nail length.  We used a 36 cm nail.  We passed that easy over the guidewire and positioned it for center center position of our lag screw.  The guidewire was advanced to the subchondral bone on the femur and the femoral head in the center position on AP and lateral films.  Measurements were taken and the reamer was passed and the screw was then advanced trying to hold the reduction.  Traction was then released and the fracture still had some separation this was corrected by further release of traction and pulling on the bone hook and the Eksir reduced well with reduction of the medial cortical line  Recheck foot position and knee position this look to be anatomically restored  We then made a third incision and passed the locking screw.  Final imaging was obtained at the best lateral view taken with the 15 degree lateral position  All jigs were removed wounds were thoroughly irrigated 30 cc of Marcaine with epinephrine was injected closure proximally was performed with #1 Braylon 0 Monocryl in 2 layers and staples followed by the 2 distal wounds using 0 Monocryl and staples  We then pulled traction on the right lower extremity to reduce the femur fracture and placed in a knee immobilizer  I have spoken with the family and advised them of the complication of the right femur fracture and my advice is to have the right femur fixed with retrograde nailing on Friday.

## 2023-04-23 NOTE — Anesthesia Postprocedure Evaluation (Signed)
 Anesthesia Post Note  Patient: [name]  Procedure(s) Performed: FIXATION, FRACTURE, INTERTROCHANTERIC, WITH INTRAMEDULLARY ROD (Left: Hip)  Patient location during evaluation: PACU Anesthesia Type: Spinal Level of consciousness: awake Pain management: pain level controlled Vital Signs Assessment: vitals unstable Respiratory status: spontaneous breathing, patient connected to nasal cannula oxygen, respiratory function stable and nonlabored ventilation Cardiovascular status: stable Postop Assessment: no headache, spinal receding and no apparent nausea or vomiting Anesthetic complications: no   There were no known notable events for this encounter.   Last Vitals:  Vitals:   04/23/23 1815 04/23/23 1830  BP: 115/60 (!) 146/70  Pulse: 81 87  Resp: 16 18  Temp: (!) 36 C   SpO2: 100% 99%    Last Pain:  Vitals:   04/23/23 1815  TempSrc:   PainSc: Asleep                 Josephine Rudnick L Mercie Balsley

## 2023-04-23 NOTE — TOC Initial Note (Signed)
 Transition of Care [facility]) - Initial/Assessment Note    Patient Details  Name: [name] MRN: [id number] Date of Birth: [birth year]  Transition of Care [facility]) CM/SW Contact:    Karn Cassis, LCSW Phone Number: 04/23/2023, 9:01 AM  Clinical Narrative:  Pt admitted due to hip fracture. Per pt's son, pt has been resident at [facility] for several years. He requests return to [facility] when medically stable. Surgery scheduled for this afternoon. Per Lynnea Ferrier at [facility], okay to return. TOC will continue to follow and facilitate return to SNF at d/c.                  Expected Discharge Plan: Skilled Nursing Facility Barriers to Discharge: Continued Medical Work up   Patient Goals and CMS Choice Patient states their goals for this hospitalization and ongoing recovery are:: return to SNF   Choice offered to / list presented to : Adult Children [place] ownership interest in [facility].provided to:: Adult Children    Expected Discharge Plan and Services In-house Referral: Clinical Social Work   Post Acute Care Choice: Skilled Nursing Facility Living arrangements for the past 2 months: Skilled Nursing Facility                                      Prior Living Arrangements/Services Living arrangements for the past 2 months: Skilled Nursing Facility Lives with:: Facility Resident Patient language and need for interpreter reviewed:: Yes Do you feel safe going back to the place where you live?: Yes      Need for Family Participation in Patient Care: Yes (Comment) Care giver support system in place?: Yes (comment)   Criminal Activity/Legal Involvement Pertinent to Current Situation/Hospitalization: No - Comment as needed  Activities of Daily Living      Permission Sought/Granted         Permission granted to share info w AGENCY: [facility]  Permission granted to share info w Relationship: SNF     Emotional Assessment   Attitude/Demeanor/Rapport: Unable to  Assess Affect (typically observed): Unable to Assess Orientation: : Oriented to Self Alcohol / Substance Use: Not Applicable Psych Involvement: No (comment)  Admission diagnosis:  Closed left hip fracture (HCC) [S72.002A] Accidental fall, initial encounter [W09.XXXA] Closed subtrochanteric fracture of hip, left, initial encounter (HCC) [S72.22XA] Skin tear of left hand without complication, initial encounter [W11.914N] Patient Active Problem List   Diagnosis Date Noted   Closed subtrochanteric fracture of hip, left, initial encounter (HCC) 04/22/2023   Closed left hip fracture (HCC) 04/22/2023   Chronic constipation 03/24/2023   Severe vascular dementia without behavioral disturbance, psychotic disturbance, mood disturbance, or anxiety (HCC) 02/12/2023   Weight loss 08/22/2022   Hyperthyroidism, subclinical 05/14/2022   CKD (chronic kidney disease) stage 2, GFR 60-89 ml/min 11/28/2020   Normochromic anemia 08/25/2020   Angina pectoris, unspecified (HCC) 08/22/2020   Chronic respiratory failure with hypoxia (HCC) 05/04/2020   Aortic atherosclerosis (HCC) 01/03/2020   Postmenopausal osteoporosis 12/17/2019   Osteoarthritis 02/22/2019   GERD without esophagitis 02/22/2019   Protein-calorie malnutrition (HCC) 02/22/2019   Major depression, recurrent, chronic (HCC) 02/22/2019   Failure to thrive in adult    Hypokalemia 02/14/2019   Dementia (HCC) 02/12/2019   Goiter 05/14/2016   HTN (hypertension) 06/14/2012   PCP:  Sharee Holster, NP Pharmacy:   [facility] - [place], [place] - [address]  [place] [address] Phone: [phone] Fax: [phone]     Social Drivers of Health (SDOH) Social History: SDOH Screenings   Depression (PHQ2-9): Low Risk  (01/22/2023)  Tobacco Use: Low Risk  (04/22/2023)   SDOH Interventions:     Readmission Risk Interventions     No data to display

## 2023-04-23 NOTE — Progress Notes (Signed)
 PROGRESS NOTE    [name]  MWU:[id number] DOB: 02-24-1928 DOA: 04/22/2023 PCP: Sharee Holster, NP   Brief Narrative:    [name] is a 89 y.o. female with medical history significant for dementia, hypertension, hyperthyroidism, respiratory failure on 2 L. Patient was brought to the ED from [facility] reports of hip fracture.  Orthopedics plans for operative intervention with IM nail today.  Assessment & Plan:   Principal Problem:   Closed left hip fracture (HCC) Active Problems:   HTN (hypertension)   Goiter   Dementia (HCC)   Chronic respiratory failure with hypoxia (HCC)  Assessment and Plan:    Closed left hip fracture (HCC) X-ray shows proximal left femoral shaft fracture with varus angulation and displacement.  Patient is nonambulatory at baseline.  Has advanced dementia.  Also with bruising to left hand -Dr. Romeo Apple planning for IM nail today. -IV morphine 2 mg every 4 hourly as needed -X-ray of left hand without any acute findings -mildly tachycardic with dry mucous membranes, LR 500 bolus, continue , 75cc/hr x 20hrs   Chronic respiratory failure with hypoxia (HCC) On 2 L chronically.  Chest x-ray negative for acute abnormality.   Dementia (HCC) Advanced vascular dementia, nursing home resident, needs assistance for all ADLs.  Not ambulatory.   Goiter Recent TSH 03/25/2023 -2.9.  T3 and T4 within normal limits. - Resume methimazole pending med reconciliation.    HTN (hypertension) Stable.  Not on antihypertensives.    DVT prophylaxis: SCDs Code Status: DNR Family Communication: None at bedside Disposition Plan:  Status is: Inpatient Remains inpatient appropriate because: Need for inpatient procedure and IV medications.   Consultants:  Orthopedics  Procedures:  None  Antimicrobials:  None   Subjective: Patient seen and evaluated today and is currently somnolent.  No acute overnight events noted since admission.  Plan for IM nail to  hip fracture today.  Objective: Vitals:   04/22/23 1700 04/22/23 1853 04/22/23 2111 04/23/23 0458  BP: 114/64 117/78 (!) 152/64 (!) 159/83  Pulse: 98 (!) 108 (!) 104 95  Resp: 18 18 20 16   Temp:  98.6 F (37 C) 98.8 F (37.1 C) 98.9 F (37.2 C)  TempSrc:  Oral Axillary Oral  SpO2: 100% 95% 99% 99%  Weight:  48.1 kg    Height:        Intake/Output Summary (Last 24 hours) at 04/23/2023 0703 Last data filed at 04/23/2023 0558 Gross per 24 hour  Intake 440 ml  Output 200 ml  Net 240 ml   Filed Weights   04/22/23 1445 04/22/23 1853  Weight: 48 kg 48.1 kg    Examination:  General exam: Appears calm and comfortable, elderly/frail Respiratory system: Clear to auscultation. Respiratory effort normal.  Nasal cannula. Cardiovascular system: S1 & S2 heard, RRR.  Gastrointestinal system: Abdomen is soft Central nervous system: Somnolent Extremities: No edema Skin: No significant lesions noted Psychiatry: Flat affect.    Data Reviewed: I have personally reviewed following labs and imaging studies  CBC: Recent Labs  Lab 04/22/23 1440  WBC 10.8*  HGB 10.9*  HCT 34.4*  MCV 92.5  PLT 366   Basic Metabolic Panel: Recent Labs  Lab 04/22/23 1440  NA 141  K 3.9  CL 105  CO2 25  GLUCOSE 212*  BUN 27*  CREATININE 0.72  CALCIUM 8.9   GFR: Estimated Creatinine Clearance: 29.8 mL/min (by C-G formula based on SCr of 0.72 mg/dL). Liver Function Tests: No results for input(s): "AST", "ALT", "ALKPHOS", "  BILITOT", "PROT", "ALBUMIN" in the last 168 hours. No results for input(s): "LIPASE", "AMYLASE" in the last 168 hours. No results for input(s): "AMMONIA" in the last 168 hours. Coagulation Profile: No results for input(s): "INR", "PROTIME" in the last 168 hours. Cardiac Enzymes: No results for input(s): "CKTOTAL", "CKMB", "CKMBINDEX", "TROPONINI" in the last 168 hours. BNP (last 3 results) No results for input(s): "PROBNP" in the last 8760 hours. HbA1C: No results for  input(s): "HGBA1C" in the last 72 hours. CBG: Recent Labs  Lab 04/22/23 2144  GLUCAP 186*   Lipid Profile: No results for input(s): "CHOL", "HDL", "LDLCALC", "TRIG", "CHOLHDL", "LDLDIRECT" in the last 72 hours. Thyroid Function Tests: No results for input(s): "TSH", "T4TOTAL", "FREET4", "T3FREE", "THYROIDAB" in the last 72 hours. Anemia Panel: No results for input(s): "VITAMINB12", "FOLATE", "FERRITIN", "TIBC", "IRON", "RETICCTPCT" in the last 72 hours. Sepsis Labs: No results for input(s): "PROCALCITON", "LATICACIDVEN" in the last 168 hours.  No results found for this or any previous visit (from the past 240 hours).       Radiology Studies: DG Hand 2 View Left Result Date: 04/22/2023 CLINICAL DATA:  Fall couple of days ago.  Left hand pain. EXAM: LEFT HAND - 2 VIEW COMPARISON:  None Available. FINDINGS: The bones are subjectively under mineralized. Assessment of the digits is limited due to osseous overlap. No evidence of acute fracture. Moderate multifocal osteoarthritis involving the metacarpal phalangeal joints and throughout the digits. No focal soft tissue abnormalities seen. IMPRESSION: 1. No acute fracture of the left hand. 2. Moderate multifocal osteoarthritis. Electronically Signed   By: Narda Rutherford M.D.   On: 04/22/2023 21:17   DG Chest 1 View Result Date: 04/22/2023 CLINICAL DATA:  Fall today. EXAM: CHEST  1 VIEW COMPARISON:  February 11, 2019. FINDINGS: The heart size and mediastinal contours are within normal limits. Both lungs are clear. The visualized skeletal structures are unremarkable. IMPRESSION: No active disease. Electronically Signed   By: Lupita Raider M.D.   On: 04/22/2023 17:13   DG HIP UNILAT W OR W/O PELVIS 2-3 VIEWS LEFT Result Date: 04/22/2023 CLINICAL DATA:  Fall, left hip pain EXAM: DG HIP (WITH OR WITHOUT PELVIS) 2-3V LEFT COMPARISON:  07/18/2017 FINDINGS: There is a left proximal femoral fracture which appears to be just below the intertrochanteric  region in the proximal shaft. Varus angulation and displacement. No subluxation or dislocation. Remote posttraumatic and postsurgical changes in the right femur. Degenerative changes in the hips bilaterally, right greater than left. IMPRESSION: Proximal left femoral shaft fracture with varus angulation and displacement. Electronically Signed   By: Charlett Nose M.D.   On: 04/22/2023 17:13        Scheduled Meds:  feeding supplement  237 mL Oral BID BM   Continuous Infusions:  lactated ringers 75 mL/hr at 04/23/23 0613     LOS: 1 day    Time spent: 35 minutes    Ibtisam Benge Hoover Brunette, DO [facility]  If 7PM-7AM, please contact night-coverage [email or web address] 04/23/2023, 7:03 AM

## 2023-04-23 NOTE — [place] FL2 (Signed)
 [place] MEDICAID FL2 LEVEL OF CARE FORM     IDENTIFICATION  Patient Name: [name] Birthdate: 1928-05-21 Sex: female Admission Date (Current Location): 04/22/2023  [place] and [place] Number:  Aaron Edelman [id number] Facility and Address:  [facility],  [address], [place] [address]      Provider Number: [id number]  Attending Physician Name and Address:  Erick Blinks, DO  Relative Name and Phone Number:       Current Level of Care: Hospital Recommended Level of Care: Skilled Nursing Facility Prior Approval Number:    Date Approved/Denied:   PASRR Number:    Discharge Plan: SNF    Current Diagnoses: Patient Active Problem List   Diagnosis Date Noted   Closed subtrochanteric fracture of hip, left, initial encounter (HCC) 04/22/2023   Closed left hip fracture (HCC) 04/22/2023   Chronic constipation 03/24/2023   Severe vascular dementia without behavioral disturbance, psychotic disturbance, mood disturbance, or anxiety (HCC) 02/12/2023   Weight loss 08/22/2022   Hyperthyroidism, subclinical 05/14/2022   CKD (chronic kidney disease) stage 2, GFR 60-89 ml/min 11/28/2020   Normochromic anemia 08/25/2020   Angina pectoris, unspecified (HCC) 08/22/2020   Chronic respiratory failure with hypoxia (HCC) 05/04/2020   Aortic atherosclerosis (HCC) 01/03/2020   Postmenopausal osteoporosis 12/17/2019   Osteoarthritis 02/22/2019   GERD without esophagitis 02/22/2019   Protein-calorie malnutrition (HCC) 02/22/2019   Major depression, recurrent, chronic (HCC) 02/22/2019   Failure to thrive in adult    Hypokalemia 02/14/2019   Dementia (HCC) 02/12/2019   Goiter 05/14/2016   HTN (hypertension) 06/14/2012    Orientation RESPIRATION BLADDER Height & Weight     Self  O2 (3L) External catheter Weight: 106 lb 0.7 oz (48.1 kg) Height:  4' 10.5" (148.6 cm)  BEHAVIORAL SYMPTOMS/MOOD NEUROLOGICAL BOWEL NUTRITION STATUS      Incontinent Diet (See d/c summary)   AMBULATORY STATUS COMMUNICATION OF NEEDS Skin   Extensive Assist Verbally Skin abrasions, Bruising, Surgical wounds                       Personal Care Assistance Level of Assistance  Bathing, Feeding, Dressing Bathing Assistance: Maximum assistance Feeding assistance: Limited assistance Dressing Assistance: Maximum assistance     Functional Limitations Info  Sight, Hearing, Speech Sight Info: Impaired Hearing Info: Adequate Speech Info: Adequate    SPECIAL CARE FACTORS FREQUENCY  PT (By licensed PT)     PT Frequency: 5x weekly              Contractures      Additional Factors Info  Code Status, Allergies Code Status Info: DNR- Limited Allergies Info: Hydrocodone-acetaminophen           Current Medications (04/23/2023):  This is the current hospital active medication list Current Facility-Administered Medications  Medication Dose Route Frequency Provider Last Rate Last Admin   acetaminophen (TYLENOL) tablet 650 mg  650 mg Oral Q6H PRN Emokpae, Ejiroghene E, MD       Or   acetaminophen (TYLENOL) suppository 650 mg  650 mg Rectal Q6H PRN Emokpae, Ejiroghene E, MD       feeding supplement (ENSURE ENLIVE / ENSURE PLUS) liquid 237 mL  237 mL Oral BID BM Emokpae, Ejiroghene E, MD       lactated ringers infusion   Intravenous Continuous Emokpae, Ejiroghene E, MD 75 mL/hr at 04/23/23 0613 New Bag at 04/23/23 5284   morphine (PF) 2 MG/ML injection 2 mg  2 mg Intravenous Q4H PRN Emokpae,  Ejiroghene E, MD       ondansetron (ZOFRAN) tablet 4 mg  4 mg Oral Q6H PRN Emokpae, Ejiroghene E, MD       Or   ondansetron (ZOFRAN) injection 4 mg  4 mg Intravenous Q6H PRN Emokpae, Ejiroghene E, MD       polyethylene glycol (MIRALAX / GLYCOLAX) packet 17 g  17 g Oral Daily PRN Emokpae, Ejiroghene E, MD         Discharge Medications: Please see discharge summary for a list of discharge medications.  Relevant Imaging Results:  Relevant Lab Results:   Additional Information     Karn Cassis, LCSW

## 2023-04-24 ENCOUNTER — Encounter (HOSPITAL_COMMUNITY): Payer: Self-pay | Admitting: Orthopedic Surgery

## 2023-04-24 DIAGNOSIS — S72102A Unspecified trochanteric fracture of left femur, initial encounter for closed fracture: Secondary | ICD-10-CM

## 2023-04-24 LAB — CBC
HCT: 29.6 % — ABNORMAL LOW (ref 36.0–46.0)
Hemoglobin: 8.9 g/dL — ABNORMAL LOW (ref 12.0–15.0)
MCH: 28.9 pg (ref 26.0–34.0)
MCHC: 30.1 g/dL (ref 30.0–36.0)
MCV: 96.1 fL (ref 80.0–100.0)
Platelets: 290 10*3/uL (ref 150–400)
RBC: 3.08 MIL/uL — ABNORMAL LOW (ref 3.87–5.11)
RDW: 14 % (ref 11.5–15.5)
WBC: 8 10*3/uL (ref 4.0–10.5)
nRBC: 0 % (ref 0.0–0.2)

## 2023-04-24 LAB — BASIC METABOLIC PANEL
Anion gap: 10 (ref 5–15)
BUN: 21 mg/dL (ref 8–23)
CO2: 26 mmol/L (ref 22–32)
Calcium: 8.5 mg/dL — ABNORMAL LOW (ref 8.9–10.3)
Chloride: 105 mmol/L (ref 98–111)
Creatinine, Ser: 0.73 mg/dL (ref 0.44–1.00)
GFR, Estimated: >60 mL/min (ref >60)
Glucose, Bld: 203 mg/dL — ABNORMAL HIGH (ref 70–99)
Potassium: 4.9 mmol/L (ref 3.5–5.1)
Sodium: 141 mmol/L (ref 135–145)

## 2023-04-24 LAB — PREPARE RBC (CROSSMATCH)

## 2023-04-24 LAB — MAGNESIUM: Magnesium: 2 mg/dL (ref 1.7–2.4)

## 2023-04-24 MED ORDER — SODIUM CHLORIDE 0.9% FLUSH: 3.0000 mL | INTRAVENOUS | Status: DC | PRN

## 2023-04-24 MED ORDER — SODIUM CHLORIDE 0.9% FLUSH
3.0000 mL | Freq: Two times a day (BID) | INTRAVENOUS | Status: DC
  Administered 2023-04-24: 3 mL via INTRAVENOUS
  Administered 2023-04-24 – 2023-04-26 (×5): 10 mL via INTRAVENOUS
  Administered 2023-04-26 – 2023-04-27 (×2): 3 mL via INTRAVENOUS
  Administered 2023-04-27 – 2023-04-28 (×2): 10 mL via INTRAVENOUS

## 2023-04-24 MED ORDER — SODIUM CHLORIDE 0.9% IV SOLUTION: Freq: Once | INTRAVENOUS | Status: DC

## 2023-04-24 MED ORDER — CEFAZOLIN SODIUM-DEXTROSE 2-4 GM/100ML-% IV SOLN: 2.0000 g | INTRAVENOUS | Status: DC

## 2023-04-24 MED ORDER — METHOCARBAMOL 1000 MG/10ML IJ SOLN: 500.0000 mg | Freq: Four times a day (QID) | INTRAMUSCULAR | Status: DC | PRN

## 2023-04-24 MED ORDER — TRAMADOL HCL 50 MG PO TABS
50.0000 mg | ORAL_TABLET | Freq: Four times a day (QID) | ORAL | Status: DC
  Administered 2023-04-24 – 2023-04-28 (×8): 50 mg via ORAL
  Filled 2023-04-24 (×12): qty 1

## 2023-04-24 MED ORDER — TRANEXAMIC ACID-NACL 1000-0.7 MG/100ML-% IV SOLN
1000.0000 mg | Freq: Once | INTRAVENOUS | Status: AC
  Administered 2023-04-24: 1000 mg via INTRAVENOUS
  Filled 2023-04-24: qty 100

## 2023-04-24 MED ORDER — HYDROCODONE-ACETAMINOPHEN 7.5-325 MG PO TABS: 1.0000 | ORAL_TABLET | ORAL | Status: DC | PRN

## 2023-04-24 MED ORDER — METOCLOPRAMIDE HCL 10 MG PO TABS: 5.0000 mg | ORAL_TABLET | Freq: Three times a day (TID) | ORAL | Status: DC | PRN

## 2023-04-24 MED ORDER — CELECOXIB 100 MG PO CAPS
200.0000 mg | ORAL_CAPSULE | Freq: Two times a day (BID) | ORAL | Status: DC
  Administered 2023-04-24 – 2023-04-28 (×9): 200 mg via ORAL
  Filled 2023-04-24 (×10): qty 2

## 2023-04-24 MED ORDER — MENTHOL 3 MG MT LOZG: 1.0000 | LOZENGE | OROMUCOSAL | Status: DC | PRN

## 2023-04-24 MED ORDER — HYDROCODONE-ACETAMINOPHEN 5-325 MG PO TABS
1.0000 | ORAL_TABLET | ORAL | Status: DC | PRN
  Administered 2023-04-27: 2 via ORAL
  Filled 2023-04-24 (×2): qty 2

## 2023-04-24 MED ORDER — CEFAZOLIN SODIUM-DEXTROSE 2-4 GM/100ML-% IV SOLN
2.0000 g | Freq: Four times a day (QID) | INTRAVENOUS | Status: AC
  Administered 2023-04-24: 2 g via INTRAVENOUS
  Filled 2023-04-24 (×2): qty 100

## 2023-04-24 MED ORDER — DOCUSATE SODIUM 100 MG PO CAPS
100.0000 mg | ORAL_CAPSULE | Freq: Two times a day (BID) | ORAL | Status: DC
  Administered 2023-04-24 – 2023-04-25 (×2): 100 mg via ORAL
  Filled 2023-04-24 (×5): qty 1

## 2023-04-24 MED ORDER — CHLORHEXIDINE GLUCONATE CLOTH 2 % EX PADS
6.0000 | MEDICATED_PAD | Freq: Every day | CUTANEOUS | Status: DC
  Administered 2023-04-24 – 2023-04-28 (×5): 6 via TOPICAL

## 2023-04-24 MED ORDER — PHENOL 1.4 % MT LIQD: 1.0000 | OROMUCOSAL | Status: DC | PRN

## 2023-04-24 MED ORDER — METOCLOPRAMIDE HCL 5 MG/ML IJ SOLN: 5.0000 mg | Freq: Three times a day (TID) | INTRAMUSCULAR | Status: DC | PRN

## 2023-04-24 MED ORDER — ACETAMINOPHEN 500 MG PO TABS
500.0000 mg | ORAL_TABLET | Freq: Four times a day (QID) | ORAL | Status: AC
  Administered 2023-04-24: 500 mg via ORAL
  Filled 2023-04-24 (×2): qty 1

## 2023-04-24 MED ORDER — MORPHINE SULFATE (PF) 2 MG/ML IV SOLN
0.5000 mg | INTRAVENOUS | Status: DC | PRN
  Administered 2023-04-24 – 2023-04-25 (×3): 1 mg via INTRAVENOUS
  Filled 2023-04-24 (×3): qty 1

## 2023-04-24 MED ORDER — METHOCARBAMOL 500 MG PO TABS: 500.0000 mg | ORAL_TABLET | Freq: Four times a day (QID) | ORAL | Status: DC | PRN

## 2023-04-24 MED ORDER — ENOXAPARIN SODIUM 30 MG/0.3ML IJ SOSY
30.0000 mg | PREFILLED_SYRINGE | INTRAMUSCULAR | Status: DC
  Administered 2023-04-24: 30 mg via SUBCUTANEOUS
  Filled 2023-04-24 (×2): qty 0.3

## 2023-04-24 NOTE — Progress Notes (Signed)
 PROGRESS NOTE    WALTER MIN  QMV:[id number] DOB: [birth year] DOA: 04/22/2023 PCP: Sharee Holster, NP   Brief Narrative:    LEVERNE TESSLER is a 89 y.o. female with medical history significant for dementia, hypertension, hyperthyroidism, respiratory failure on 2 L. Patient was brought to the ED from [facility] reports of hip fracture.  Patient underwent IM nail placement to left hip fracture on 3/19 and will need to have surgical intervention to right hip fracture on 3/21.  Assessment & Plan:   Principal Problem:   Closed pertrochanteric fracture of proximal end of left femur (HCC) Active Problems:   HTN (hypertension)   Goiter   Dementia (HCC)   Chronic respiratory failure with hypoxia (HCC)   Closed subtrochanteric fracture of hip, left, initial encounter (HCC)  Assessment and Plan:    Closed left hip fracture (HCC) X-ray shows proximal left femoral shaft fracture with varus angulation and displacement.  Patient is nonambulatory at baseline.  Has advanced dementia.  Also with bruising to left hand -Dr. Romeo Apple planning for IM nail today. -IV morphine 2 mg every 4 hourly as needed -X-ray of left hand without any acute findings -mildly tachycardic with dry mucous membranes, LR 500 bolus, continue , 75cc/hr x 20hrs  New right hip fracture -Plan for operative intervention 3/21   Chronic respiratory failure with hypoxia (HCC) On 2 L chronically.  Chest x-ray negative for acute abnormality.   Dementia (HCC) Advanced vascular dementia, nursing home resident, needs assistance for all ADLs.  Not ambulatory.   Goiter Recent TSH 03/25/2023 -2.9.  T3 and T4 within normal limits. - Resume methimazole pending med reconciliation.    HTN (hypertension) Stable.  Not on antihypertensives.    DVT prophylaxis: SCDs Code Status: DNR Family Communication: Son at bedside 3/20 Disposition Plan:  Status is: Inpatient Remains inpatient appropriate because: Need for inpatient  procedure and IV medications.   Consultants:  Orthopedics  Procedures:  Left IM nail 3/19  Antimicrobials:  Anti-infectives (From admission, onward)    Start     Dose/Rate Route Frequency Ordered Stop   04/25/23 1145  ceFAZolin (ANCEF) IVPB 2g/100 mL premix        2 g 200 mL/hr over 30 Minutes Intravenous On call to O.R. 04/24/23 0901 04/26/23 0559   04/24/23 0600  ceFAZolin (ANCEF) IVPB 2g/100 mL premix        2 g 200 mL/hr over 30 Minutes Intravenous On call to O.R. 04/23/23 1419 04/23/23 1535   04/24/23 0031  ceFAZolin (ANCEF) IVPB 2g/100 mL premix        2 g 200 mL/hr over 30 Minutes Intravenous Every 6 hours 04/24/23 0031 04/24/23 1329   04/23/23 1425  ceFAZolin (ANCEF) 2-4 GM/100ML-% IVPB       Note to Pharmacy: Trenton Gammon S: cabinet override      04/23/23 1425 04/23/23 1536       Subjective: Patient seen and evaluated today and is currently somnolent.  Son is at bedside.  She underwent IM nail to the left hip yesterday with plans for right hip fixation planned for 3/21.  Objective: Vitals:   04/23/23 2251 04/24/23 0032 04/24/23 0210 04/24/23 0503  BP: 107/68  (!) 113/59 102/62  Pulse: (!) 102  99 91  Resp: 18  14 16   Temp: 97.9 F (36.6 C)  97.8 F (36.6 C) 98.5 F (36.9 C)  TempSrc:      SpO2: 99% 96% 96% 96%  Weight:      Height:  Intake/Output Summary (Last 24 hours) at 04/24/2023 1137 Last data filed at 04/24/2023 0636 Gross per 24 hour  Intake 1820 ml  Output 550 ml  Net 1270 ml   Filed Weights   04/22/23 1445 04/22/23 1853  Weight: 48 kg 48.1 kg    Examination:  General exam: Appears calm and comfortable, elderly/frail Respiratory system: Clear to auscultation. Respiratory effort normal.  Nasal cannula. Cardiovascular system: S1 & S2 heard, RRR.  Gastrointestinal system: Abdomen is soft Central nervous system: Somnolent Extremities: No edema Skin: No significant lesions noted Psychiatry: Flat affect.    Data Reviewed: I  have personally reviewed following labs and imaging studies  CBC: Recent Labs  Lab 04/22/23 1440 04/24/23 0429  WBC 10.8* 8.0  HGB 10.9* 8.9*  HCT 34.4* 29.6*  MCV 92.5 96.1  PLT 366 290   Basic Metabolic Panel: Recent Labs  Lab 04/22/23 1440 04/24/23 0429  NA 141 141  K 3.9 4.9  CL 105 105  CO2 25 26  GLUCOSE 212* 203*  BUN 27* 21  CREATININE 0.72 0.73  CALCIUM 8.9 8.5*  MG  --  2.0   GFR: Estimated Creatinine Clearance: 29.8 mL/min (by C-G formula based on SCr of 0.73 mg/dL). Liver Function Tests: No results for input(s): "AST", "ALT", "ALKPHOS", "BILITOT", "PROT", "ALBUMIN" in the last 168 hours. No results for input(s): "LIPASE", "AMYLASE" in the last 168 hours. No results for input(s): "AMMONIA" in the last 168 hours. Coagulation Profile: No results for input(s): "INR", "PROTIME" in the last 168 hours. Cardiac Enzymes: No results for input(s): "CKTOTAL", "CKMB", "CKMBINDEX", "TROPONINI" in the last 168 hours. BNP (last 3 results) No results for input(s): "PROBNP" in the last 8760 hours. HbA1C: No results for input(s): "HGBA1C" in the last 72 hours. CBG: Recent Labs  Lab 04/22/23 2144  GLUCAP 186*   Lipid Profile: No results for input(s): "CHOL", "HDL", "LDLCALC", "TRIG", "CHOLHDL", "LDLDIRECT" in the last 72 hours. Thyroid Function Tests: No results for input(s): "TSH", "T4TOTAL", "FREET4", "T3FREE", "THYROIDAB" in the last 72 hours. Anemia Panel: No results for input(s): "VITAMINB12", "FOLATE", "FERRITIN", "TIBC", "IRON", "RETICCTPCT" in the last 72 hours. Sepsis Labs: No results for input(s): "PROCALCITON", "LATICACIDVEN" in the last 168 hours.  No results found for this or any previous visit (from the past 240 hours).       Radiology Studies: DG FEMUR MIN 2 VIEWS LEFT Result Date: 04/23/2023 CLINICAL DATA:  Postop femur fracture EXAM: LEFT FEMUR 2 VIEWS; RIGHT FEMUR 2 VIEWS COMPARISON:  Same day radiographs and fluoroscopic images FINDINGS:  LEFT: Expected postoperative change of intramedullary rod and screw fixation across a proximal left femur fracture. Cutaneous staples and soft tissue gas about the left hip. No radiographic evidence of loosening. No additional fractures. RIGHT: Fixation of the proximal right femur. No radiographic evidence of loosening. Acute oblique mildly displaced fracture in the mid to distal right femur. There is apex anterior angulation. IMPRESSION: 1. Acute oblique mildly angulated and displaced fracture of the distal RIGHT femur. 2. Expected postoperative change from ORIF of the proximal LEFT femur fracture. Electronically Signed   By: Minerva Fester M.D.   On: 04/23/2023 20:43   DG FEMUR, MIN 2 VIEWS RIGHT Result Date: 04/23/2023 CLINICAL DATA:  Postop femur fracture EXAM: LEFT FEMUR 2 VIEWS; RIGHT FEMUR 2 VIEWS COMPARISON:  Same day radiographs and fluoroscopic images FINDINGS: LEFT: Expected postoperative change of intramedullary rod and screw fixation across a proximal left femur fracture. Cutaneous staples and soft tissue gas about the left hip. No  radiographic evidence of loosening. No additional fractures. RIGHT: Fixation of the proximal right femur. No radiographic evidence of loosening. Acute oblique mildly displaced fracture in the mid to distal right femur. There is apex anterior angulation. IMPRESSION: 1. Acute oblique mildly angulated and displaced fracture of the distal RIGHT femur. 2. Expected postoperative change from ORIF of the proximal LEFT femur fracture. Electronically Signed   By: Minerva Fester M.D.   On: 04/23/2023 20:43   DG FEMUR MIN 2 VIEWS LEFT Result Date: 04/23/2023 CLINICAL DATA:  ORIF left hip fracture. EXAM: LEFT FEMUR 2 VIEWS FLUOROSCOPY TIME:  Fluoroscopy Time:  4 minutes 25 seconds Radiation Exposure Index (if provided by the fluoroscopic device): 45.95 mGy Number of Acquired Spot Images: 18 COMPARISON:  Radiographs 04/22/2023 FINDINGS: Multiple intraoperative fluoroscopic spot  images are provided without a radiologist present during intramedullary rod and screw fixation across a proximal left femur fracture. IMPRESSION: Intraoperative fluoroscopic spot images during proximal left femur fracture fixation. Electronically Signed   By: Minerva Fester M.D.   On: 04/23/2023 20:39   DG C-Arm 1-60 Min-No Report Result Date: 04/23/2023 Fluoroscopy was utilized by the requesting physician.  No radiographic interpretation.   DG Hand 2 View Left Result Date: 04/22/2023 CLINICAL DATA:  Fall couple of days ago.  Left hand pain. EXAM: LEFT HAND - 2 VIEW COMPARISON:  None Available. FINDINGS: The bones are subjectively under mineralized. Assessment of the digits is limited due to osseous overlap. No evidence of acute fracture. Moderate multifocal osteoarthritis involving the metacarpal phalangeal joints and throughout the digits. No focal soft tissue abnormalities seen. IMPRESSION: 1. No acute fracture of the left hand. 2. Moderate multifocal osteoarthritis. Electronically Signed   By: Narda Rutherford M.D.   On: 04/22/2023 21:17   DG Chest 1 View Result Date: 04/22/2023 CLINICAL DATA:  Fall today. EXAM: CHEST  1 VIEW COMPARISON:  February 11, 2019. FINDINGS: The heart size and mediastinal contours are within normal limits. Both lungs are clear. The visualized skeletal structures are unremarkable. IMPRESSION: No active disease. Electronically Signed   By: Lupita Raider M.D.   On: 04/22/2023 17:13   DG HIP UNILAT W OR W/O PELVIS 2-3 VIEWS LEFT Result Date: 04/22/2023 CLINICAL DATA:  Fall, left hip pain EXAM: DG HIP (WITH OR WITHOUT PELVIS) 2-3V LEFT COMPARISON:  07/18/2017 FINDINGS: There is a left proximal femoral fracture which appears to be just below the intertrochanteric region in the proximal shaft. Varus angulation and displacement. No subluxation or dislocation. Remote posttraumatic and postsurgical changes in the right femur. Degenerative changes in the hips bilaterally, right  greater than left. IMPRESSION: Proximal left femoral shaft fracture with varus angulation and displacement. Electronically Signed   By: Charlett Nose M.D.   On: 04/22/2023 17:13        Scheduled Meds:  sodium chloride   Intravenous Once   acetaminophen  500 mg Oral Q6H   celecoxib  200 mg Oral BID   Chlorhexidine Gluconate Cloth  6 each Topical Q0600   docusate sodium  100 mg Oral BID   enoxaparin (LOVENOX) injection  30 mg Subcutaneous Q24H   feeding supplement  237 mL Oral BID BM   sodium chloride flush  3-10 mL Intravenous Q12H   traMADol  50 mg Oral Q6H   Continuous Infusions:   ceFAZolin (ANCEF) IV 2 g (04/24/23 0114)   [START ON 04/25/2023]  ceFAZolin (ANCEF) IV       LOS: 2 days    Time spent: 35 minutes  Caliope Ruppert Hoover Brunette, DO [facility]  If 7PM-7AM, please contact night-coverage [email or web address] 04/24/2023, 11:37 AM

## 2023-04-24 NOTE — Evaluation (Signed)
 Clinical/Bedside Swallow Evaluation Patient Details  Name: [name] MRN: [id number] Date of Birth: March 29, 1928  Today's Date: 04/24/2023 Time: SLP Start Time (ACUTE ONLY): 1402 SLP Stop Time (ACUTE ONLY): 1429 SLP Time Calculation (min) (ACUTE ONLY): 27 min  Past Medical History:  Past Medical History:  Diagnosis Date   Anxiety    Dementia (HCC)    GERD (gastroesophageal reflux disease)    Goiter    Hypertension    Major depression, recurrent, chronic (HCC) 02/22/2019   Osteoarthritis    Osteoporosis    Vitamin D insufficiency    Past Surgical History:  Past Surgical History:  Procedure Laterality Date   APPENDECTOMY     INTRAMEDULLARY (IM) NAIL INTERTROCHANTERIC Left 04/23/2023   Procedure: FIXATION, FRACTURE, INTERTROCHANTERIC, WITH INTRAMEDULLARY ROD;  Surgeon: Vickki Hearing, MD;  Location: [facility];  Service: Orthopedics;  Laterality: Left;   KIDNEY STONE SURGERY     ORIF HIP FRACTURE Right 11/20/2012   Procedure: OPEN REDUCTION INTERNAL FIXATION RIGHT HIP;  Surgeon: Darreld Mclean, MD;  Location: [facility];  Service: Orthopedics;  Laterality: Right;   stress fractures of legs     TUBAL LIGATION     HPI:  [name] is a 89 y.o. female with medical history significant for dementia, hypertension, hyperthyroidism, respiratory failure on 2 L.  Patient was brought to the ED from [facility] reports of hip fracture.  Patient underwent IM nail placement to left hip fracture on 3/19 and will need to have surgical intervention to right hip fracture on 3/21.    Assessment / Plan / Recommendation  Clinical Impression  Pt seen at bedside for clinical swallow evaluation with son in room. He indicated that his daughter gave Pt a sip of water via straw this AM and Pt "choked", however was in suboptimal position. SLP elevated HOB, completed oral care, attempted oral motor evaluation. Pt has a goiter, which has present "for a long time". She was able to follow simple commands with  visual model from SLP. Pt assessed with ice chips, thin water via cup/straw, puree, mech soft, and regular textures. Pt benefited from a pillow folded behind her head to tip her head to a neutral/down position for PO intake. Pt without overt signs of aspiration or reduced airway protection, however does present with oral phase deficits likely due to cognitive changes. She exhibits a munching pattern with solids and reduced lingual movement resulting in prolonged oral phase and anterior pocketing. Family indicates that Pt is on pureed diet at [facility]. Recommend D1/puree and thin liquids with PO medication crushed as able in puree when Pt is alert and upright. Pt will need feeder assist and assist for oral care. Pt is scheduled for another surgery tomorrow, will keep Pt on SLP list and follow during acute stay.   SLP Visit Diagnosis: Dysphagia, unspecified (R13.10)    Aspiration Risk  Mild aspiration risk    Diet Recommendation Dysphagia 1 (Puree);Thin liquid    Liquid Administration via: Cup;Straw Medication Administration: Crushed with puree Supervision: Staff to assist with self feeding;Full supervision/cueing for compensatory strategies Compensations: Slow rate;Minimize environmental distractions;Small sips/bites Postural Changes: Seated upright at 90 degrees;Remain upright for at least 30 minutes after po intake    Other  Recommendations Oral Care Recommendations: Oral care BID;Staff/trained caregiver to provide oral care    Recommendations for follow up therapy are one component of a multi-disciplinary discharge planning process, led by the attending physician.  Recommendations may be updated based on patient status, additional functional  criteria and insurance authorization.  Follow up Recommendations Follow physician's recommendations for discharge plan and follow up therapies      Assistance Recommended at Discharge    Functional Status Assessment Patient has had a recent decline in their  functional status and demonstrates the ability to make significant improvements in function in a reasonable and predictable amount of time.  Frequency and Duration min 2x/week  1 week       Prognosis Prognosis for improved oropharyngeal function: Fair Barriers to Reach Goals: Other (Comment) (Pt going back in for another surgery tomorrow)      Swallow Study   General Date of Onset: 04/22/23 HPI: [name] is a 89 y.o. female with medical history significant for dementia, hypertension, hyperthyroidism, respiratory failure on 2 L.  Patient was brought to the ED from [facility] reports of hip fracture.  Patient underwent IM nail placement to left hip fracture on 3/19 and will need to have surgical intervention to right hip fracture on 3/21. Type of Study: Bedside Swallow Evaluation Previous Swallow Assessment: n.a Diet Prior to this Study: Regular;Thin liquids (Level 0) (family reports that diet at [facility] is puree/thin) Temperature Spikes Noted: No Respiratory Status: Nasal cannula History of Recent Intubation: No Behavior/Cognition: Alert;Cooperative;Pleasant mood;Requires cueing Oral Cavity Assessment: Within Functional Limits;Dry Oral Care Completed by SLP: Yes Oral Cavity - Dentition: Poor condition Vision: Impaired for self-feeding Self-Feeding Abilities: Total assist Patient Positioning: Upright in bed Baseline Vocal Quality: Normal Volitional Cough: Cognitively unable to elicit Volitional Swallow: Unable to elicit    Oral/Motor/Sensory Function Overall Oral Motor/Sensory Function: Within functional limits   Ice Chips Ice chips: Within functional limits Presentation: Spoon   Thin Liquid Thin Liquid: Within functional limits Presentation: Cup;Straw    Nectar Thick Nectar Thick Liquid: Not tested   Honey Thick Honey Thick Liquid: Not tested   Puree Puree: Impaired Presentation: Spoon Oral Phase Impairments: Reduced lingual movement/coordination   Solid     Solid:  Impaired Presentation: Spoon Oral Phase Impairments: Reduced lingual movement/coordination;Impaired mastication Oral Phase Functional Implications: Prolonged oral transit;Oral residue      Cariah Salatino 04/24/2023,2:42 PM

## 2023-04-24 NOTE — Progress Notes (Signed)
 PT Cancellation Note  Patient Details Name: [name] MRN: [id number] DOB: 1929-01-16   Cancelled Treatment:    Reason Eval/Treat Not Completed: Other (comment).  Physical therapy held due to planned surgery right femur retrograde intramedullary nailing for acute osteoporotic IntraOp right femur fracture tomorrow.  3:01 PM, 04/24/23 Ocie Bob, MPT Physical Therapist with [facility] [phone] office [phone] mobile phone

## 2023-04-24 NOTE — Plan of Care (Signed)

## 2023-04-24 NOTE — Progress Notes (Signed)
 Pt evaluated with bedside swallow evaluation, performed with thin liquids, and apple sauce to assess swallowing ability pt forgetful to swallow and coughing with drinking thin liquids and applesauce. Speech Therapy Consult due to risk for aspiration regarding diet as patient previously had a regular diet.

## 2023-04-24 NOTE — Progress Notes (Addendum)
 Subjective: 1 Day Post-Op Procedure(s) (LRB): FIXATION, FRACTURE, INTERTROCHANTERIC, WITH INTRAMEDULLARY ROD (Left) Patient reports pain as mild.    Objective: Vital signs in last 24 hours: Temp:  [96.8 F (36 C)-99.2 F (37.3 C)] 98.5 F (36.9 C) (03/20 0503) Pulse Rate:  [81-102] 91 (03/20 0503) Resp:  [14-22] 16 (03/20 0503) BP: (102-147)/(56-81) 102/62 (03/20 0503) SpO2:  [95 %-100 %] 96 % (03/20 0503) FiO2 (%):  [28 %-36 %] 36 % (03/20 0032)  Intake/Output from previous day: 03/19 0701 - 03/20 0700 In: 1820 [P.O.:20; I.V.:1800] Out: 550 [Urine:450; Blood:100] Intake/Output this shift: No intake/output data recorded.  Recent Labs    04/22/23 1440 04/24/23 0429  HGB 10.9* 8.9*   Recent Labs    04/22/23 1440 04/24/23 0429  WBC 10.8* 8.0  RBC 3.72* 3.08*  HCT 34.4* 29.6*  PLT 366 290   Recent Labs    04/22/23 1440 04/24/23 0429  NA 141 141  K 3.9 4.9  CL 105 105  CO2 25 26  BUN 27* 21  CREATININE 0.72 0.73  GLUCOSE 212* 203*  CALCIUM 8.9 8.5*   No results for input(s): "LABPT", "INR" in the last 72 hours.  Right lower extremity neurovascularly intact immobilizer in place no signs of ischemia or compartment syndrome  Left lower extremity: No drainage on the dressing.  Left leg shows some internal rotation.  Observation at this time regarding that.  Patient is not an ambulator and postop and IntraOp imaging indicate proper alignment of the limb.  Assessment/Plan: 1 Day Post-Op Procedure(s) (LRB): FIXATION, FRACTURE, INTERTROCHANTERIC, WITH INTRAMEDULLARY ROD (Left) Advance diet Up with therapy D/C IV fluids Discharge to SNF  Acute blood loss anemia monitor hemoglobin currently at 8.9 N.p.o. after midnight preparation for surgery right femur retrograde intramedullary nailing for acute osteoporotic IntraOp  right femur fracture  Confirm type and cross for 2 units of blood in case tomorrow's hemoglobin is less than 8.  We can then transfuse the morning  of surgery.  We can also transfuse at the beginning of the case     Fuller Canada 04/24/2023, 8:35 AM

## 2023-04-24 NOTE — TOC Progression Note (Signed)
 Transition of Care [facility]) - Progression Note    Patient Details  Name: [name] MRN: [id number] Date of Birth: 01/06/1929  [facility] CM/SW Contact  Elliot Gault, LCSW Phone Number: 04/24/2023, 12:14 PM  Clinical Narrative:     TOC following. Per MD, pt will have surgery again tomorrow for a new fracture. Anticipating dc early next week.  TOC will follow.  Expected Discharge Plan: Skilled Nursing Facility Barriers to Discharge: Continued Medical Work up  Expected Discharge Plan and Services In-house Referral: Clinical Social Work   Post Acute Care Choice: Skilled Nursing Facility Living arrangements for the past 2 months: Skilled Nursing Facility                                       Social Determinants of Health (SDOH) Interventions SDOH Screenings   Food Insecurity: Patient Declined (04/23/2023)  Housing: Patient Declined (04/23/2023)  Transportation Needs: Patient Declined (04/23/2023)  Utilities: Patient Declined (04/23/2023)  Depression (PHQ2-9): Low Risk  (01/22/2023)  Social Connections: Patient Declined (04/23/2023)  Tobacco Use: Low Risk  (04/23/2023)    Readmission Risk Interventions     No data to display

## 2023-04-25 ENCOUNTER — Inpatient Hospital Stay (HOSPITAL_COMMUNITY)

## 2023-04-25 ENCOUNTER — Other Ambulatory Visit: Payer: Self-pay

## 2023-04-25 ENCOUNTER — Inpatient Hospital Stay (HOSPITAL_COMMUNITY): Admitting: Certified Registered"

## 2023-04-25 ENCOUNTER — Encounter (HOSPITAL_COMMUNITY): Payer: Self-pay | Admitting: Internal Medicine

## 2023-04-25 ENCOUNTER — Encounter (HOSPITAL_COMMUNITY)
Admission: EM | Disposition: A | Discharge status: Skilled Nursing Facility | Payer: Self-pay | Source: Skilled Nursing Facility | Attending: Internal Medicine

## 2023-04-25 DIAGNOSIS — J449 Chronic obstructive pulmonary disease, unspecified: Secondary | ICD-10-CM

## 2023-04-25 DIAGNOSIS — S72341A Displaced spiral fracture of shaft of right femur, initial encounter for closed fracture: Secondary | ICD-10-CM

## 2023-04-25 DIAGNOSIS — S72102A Unspecified trochanteric fracture of left femur, initial encounter for closed fracture: Secondary | ICD-10-CM | POA: Diagnosis not present

## 2023-04-25 DIAGNOSIS — F418 Other specified anxiety disorders: Secondary | ICD-10-CM

## 2023-04-25 DIAGNOSIS — S72301A Unspecified fracture of shaft of right femur, initial encounter for closed fracture: Secondary | ICD-10-CM

## 2023-04-25 DIAGNOSIS — I10 Essential (primary) hypertension: Secondary | ICD-10-CM

## 2023-04-25 HISTORY — PX: HARDWARE REMOVAL: SHX979

## 2023-04-25 HISTORY — PX: FEMUR IM NAIL: SHX1597

## 2023-04-25 LAB — SURGICAL PCR SCREEN
MRSA, PCR: POSITIVE — AB
Staphylococcus aureus: POSITIVE — AB

## 2023-04-25 LAB — CBC
HCT: 26 % — ABNORMAL LOW (ref 36.0–46.0)
Hemoglobin: 8 g/dL — ABNORMAL LOW (ref 12.0–15.0)
MCH: 29.3 pg (ref 26.0–34.0)
MCHC: 30.8 g/dL (ref 30.0–36.0)
MCV: 95.2 fL (ref 80.0–100.0)
Platelets: 292 10*3/uL (ref 150–400)
RBC: 2.73 MIL/uL — ABNORMAL LOW (ref 3.87–5.11)
RDW: 14.1 % (ref 11.5–15.5)
WBC: 9.5 10*3/uL (ref 4.0–10.5)
nRBC: 0 % (ref 0.0–0.2)

## 2023-04-25 LAB — BASIC METABOLIC PANEL
Anion gap: 7 (ref 5–15)
BUN: 35 mg/dL — ABNORMAL HIGH (ref 8–23)
CO2: 27 mmol/L (ref 22–32)
Calcium: 7.6 mg/dL — ABNORMAL LOW (ref 8.9–10.3)
Chloride: 106 mmol/L (ref 98–111)
Creatinine, Ser: 0.77 mg/dL (ref 0.44–1.00)
GFR, Estimated: >60 mL/min (ref >60)
Glucose, Bld: 143 mg/dL — ABNORMAL HIGH (ref 70–99)
Potassium: 4.2 mmol/L (ref 3.5–5.1)
Sodium: 140 mmol/L (ref 135–145)

## 2023-04-25 LAB — MAGNESIUM: Magnesium: 2.2 mg/dL (ref 1.7–2.4)

## 2023-04-25 SURGERY — INSERTION, INTRAMEDULLARY ROD, FEMUR, RETROGRADE
Anesthesia: Spinal | Site: Leg Upper | Laterality: Right

## 2023-04-25 MED ORDER — PROMETHAZINE HCL 25 MG/ML IJ SOLN: 12.5000 mg | Freq: Once | INTRAMUSCULAR | Status: DC | PRN

## 2023-04-25 MED ORDER — LACTATED RINGERS IV SOLN: INTRAVENOUS | Status: DC

## 2023-04-25 MED ORDER — CHLORHEXIDINE GLUCONATE 4 % EX SOLN: 1.0000 | CUTANEOUS | 1 refills | Status: DC

## 2023-04-25 MED ORDER — CEFAZOLIN SODIUM-DEXTROSE 2-4 GM/100ML-% IV SOLN
2.0000 g | INTRAVENOUS | Status: AC
  Administered 2023-04-25: 2 g via INTRAVENOUS
  Filled 2023-04-25: qty 100

## 2023-04-25 MED ORDER — PROPOFOL 10 MG/ML IV BOLUS
INTRAVENOUS | Status: AC
  Filled 2023-04-25: qty 20

## 2023-04-25 MED ORDER — LIDOCAINE HCL (PF) 2 % IJ SOLN
INTRAMUSCULAR | Status: DC | PRN
  Administered 2023-04-25: 40 mg via INTRADERMAL

## 2023-04-25 MED ORDER — BUPIVACAINE-EPINEPHRINE (PF) 0.25% -1:200000 IJ SOLN
INTRAMUSCULAR | Status: AC
  Filled 2023-04-25: qty 30

## 2023-04-25 MED ORDER — FENTANYL CITRATE PF 50 MCG/ML IJ SOSY: 25.0000 ug | PREFILLED_SYRINGE | INTRAMUSCULAR | Status: DC | PRN

## 2023-04-25 MED ORDER — FENTANYL CITRATE (PF) 100 MCG/2ML IJ SOLN
INTRAMUSCULAR | Status: DC | PRN
Start: 2023-04-25 — End: 2023-04-25

## 2023-04-25 MED ORDER — MUPIROCIN 2 % EX OINT
1.0000 | TOPICAL_OINTMENT | Freq: Two times a day (BID) | CUTANEOUS | Status: DC
  Administered 2023-04-25 – 2023-04-28 (×6): 1 via NASAL
  Filled 2023-04-25: qty 22

## 2023-04-25 MED ORDER — POVIDONE-IODINE 10 % EX SWAB
2.0000 | Freq: Once | CUTANEOUS | Status: DC
Start: 2023-04-25 — End: 2023-04-25

## 2023-04-25 MED ORDER — PHENYLEPHRINE 80 MCG/ML (10ML) SYRINGE FOR IV PUSH (FOR BLOOD PRESSURE SUPPORT)
PREFILLED_SYRINGE | INTRAVENOUS | Status: DC | PRN
  Administered 2023-04-25: 160 ug via INTRAVENOUS

## 2023-04-25 MED ORDER — FENTANYL CITRATE (PF) 100 MCG/2ML IJ SOLN
INTRAMUSCULAR | Status: AC
  Filled 2023-04-25: qty 2

## 2023-04-25 MED ORDER — PHENYLEPHRINE 80 MCG/ML (10ML) SYRINGE FOR IV PUSH (FOR BLOOD PRESSURE SUPPORT)
PREFILLED_SYRINGE | INTRAVENOUS | Status: AC
  Filled 2023-04-25: qty 10

## 2023-04-25 MED ORDER — LIDOCAINE HCL (PF) 2 % IJ SOLN
INTRAMUSCULAR | Status: AC
  Filled 2023-04-25: qty 5

## 2023-04-25 MED ORDER — PROPOFOL 10 MG/ML IV BOLUS
INTRAVENOUS | Status: DC | PRN
  Administered 2023-04-25 (×2): 30 mg via INTRAVENOUS

## 2023-04-25 MED ORDER — MUPIROCIN 2 % EX OINT: 1.0000 | TOPICAL_OINTMENT | Freq: Two times a day (BID) | CUTANEOUS | 0 refills | Status: AC

## 2023-04-25 MED ORDER — CHLORHEXIDINE GLUCONATE CLOTH 2 % EX PADS
6.0000 | MEDICATED_PAD | Freq: Every day | CUTANEOUS | Status: DC
  Administered 2023-04-25 – 2023-04-27 (×3): 6 via TOPICAL

## 2023-04-25 MED ORDER — PHENYLEPHRINE HCL-NACL 20-0.9 MG/250ML-% IV SOLN
INTRAVENOUS | Status: AC
  Filled 2023-04-25: qty 250

## 2023-04-25 MED ORDER — BUPIVACAINE HCL (PF) 0.75 % IJ SOLN
INTRAMUSCULAR | Status: DC | PRN
  Administered 2023-04-25: 1.6 mL

## 2023-04-25 MED ORDER — PHENYLEPHRINE HCL-NACL 20-0.9 MG/250ML-% IV SOLN
INTRAVENOUS | Status: DC | PRN
  Administered 2023-04-25: 40 ug/min via INTRAVENOUS

## 2023-04-25 MED ORDER — BUPIVACAINE-EPINEPHRINE 0.5% -1:200000 IJ SOLN
INTRAMUSCULAR | Status: DC | PRN
  Administered 2023-04-25: 30 mL

## 2023-04-25 MED ORDER — PROPOFOL 500 MG/50ML IV EMUL
INTRAVENOUS | Status: DC | PRN
  Administered 2023-04-25: 50 ug/kg/min via INTRAVENOUS
  Administered 2023-04-25: 70 ug/kg/min via INTRAVENOUS

## 2023-04-25 MED ORDER — EPHEDRINE SULFATE-NACL 50-0.9 MG/10ML-% IV SOSY
PREFILLED_SYRINGE | INTRAVENOUS | Status: DC | PRN
  Administered 2023-04-25: 5 mg via INTRAVENOUS

## 2023-04-25 MED ORDER — ORAL CARE MOUTH RINSE
15.0000 mL | Freq: Once | OROMUCOSAL | Status: DC
Start: 2023-04-25 — End: 2023-04-25

## 2023-04-25 MED ORDER — ONDANSETRON HCL 4 MG/2ML IJ SOLN
INTRAMUSCULAR | Status: AC
  Filled 2023-04-25: qty 2

## 2023-04-25 MED ORDER — TRANEXAMIC ACID-NACL 1000-0.7 MG/100ML-% IV SOLN
1000.0000 mg | INTRAVENOUS | Status: AC
  Administered 2023-04-25: 1000 mg via INTRAVENOUS
  Filled 2023-04-25: qty 100

## 2023-04-25 MED ORDER — LACTATED RINGERS IV SOLN
INTRAVENOUS | Status: DC
Start: 2023-04-25 — End: 2023-04-25

## 2023-04-25 MED ORDER — EPHEDRINE 5 MG/ML INJ
INTRAVENOUS | Status: AC
  Filled 2023-04-25: qty 5

## 2023-04-25 MED ORDER — BUPIVACAINE-EPINEPHRINE (PF) 0.5% -1:200000 IJ SOLN
INTRAMUSCULAR | Status: AC
  Filled 2023-04-25: qty 30

## 2023-04-25 MED ORDER — CHLORHEXIDINE GLUCONATE 4 % EX SOLN
60.0000 mL | Freq: Once | CUTANEOUS | Status: AC
  Administered 2023-04-25: 4 via TOPICAL

## 2023-04-25 MED ORDER — CHLORHEXIDINE GLUCONATE 0.12 % MT SOLN: 15.0000 mL | Freq: Once | OROMUCOSAL | Status: DC

## 2023-04-25 MED ORDER — SODIUM CHLORIDE 0.9 % IR SOLN
Status: DC | PRN
  Administered 2023-04-25: 1000 mL

## 2023-04-25 MED ORDER — SODIUM CHLORIDE 0.9% IV SOLUTION: Freq: Once | INTRAVENOUS | Status: AC

## 2023-04-25 MED ORDER — FENTANYL CITRATE (PF) 100 MCG/2ML IJ SOLN
INTRAMUSCULAR | Status: DC | PRN
  Administered 2023-04-25: 15 ug via INTRATHECAL

## 2023-04-25 SURGICAL SUPPLY — 58 items
BENZOIN TINCTURE PRP APPL 2/3 (GAUZE/BANDAGES/DRESSINGS) IMPLANT
BIT DRILL 4.0X165 AO STYLE (BIT) IMPLANT
BIT DRILL CALIBRATED AO 5.5 (DRILL) IMPLANT
BLADE 10 SAFETY STRL DISP (BLADE) ×4 IMPLANT
BNDG COHESIVE 4X5 TAN STRL (GAUZE/BANDAGES/DRESSINGS) ×2 IMPLANT
CHLORAPREP W/TINT 26 (MISCELLANEOUS) ×2 IMPLANT
CLOTH BEACON ORANGE TIMEOUT ST (SAFETY) ×2 IMPLANT
COVER LIGHT HANDLE STERIS (MISCELLANEOUS) ×4 IMPLANT
COVER MAYO STAND XLG (MISCELLANEOUS) ×2 IMPLANT
DRAPE C-ARM FOLDED MOBILE STRL (DRAPES) ×2 IMPLANT
DRAPE HALF SHEET 40X57 (DRAPES) ×8 IMPLANT
DRAPE INCISE IOBAN 44X35 STRL (DRAPES) IMPLANT
DRAPE ORTHO 2.5IN SPLIT 77X108 (DRAPES) ×4 IMPLANT
DRESSING MEPILEX BORDER 6X8 (GAUZE/BANDAGES/DRESSINGS) IMPLANT
DRILL CALIBRATED AO 5.5 (DRILL) ×2 IMPLANT
DRSG MEPILEX BORDER 6X8 (GAUZE/BANDAGES/DRESSINGS) ×4 IMPLANT
DRSG MEPILEX FLEX 6X6 (GAUZE/BANDAGES/DRESSINGS) IMPLANT
ELECT REM PT RETURN 9FT ADLT (ELECTROSURGICAL) ×2 IMPLANT
ELECTRODE REM PT RTRN 9FT ADLT (ELECTROSURGICAL) ×2 IMPLANT
GAUZE XEROFORM 5X9 LF (GAUZE/BANDAGES/DRESSINGS) ×2 IMPLANT
GLOVE BIOGEL PI IND STRL 7.0 (GLOVE) ×4 IMPLANT
GLOVE SS N UNI LF 8.5 STRL (GLOVE) ×2 IMPLANT
GLOVE SURG POLYISO LF SZ8 (GLOVE) ×2 IMPLANT
GOWN STRL REUS W/TWL LRG LVL3 (GOWN DISPOSABLE) ×4 IMPLANT
GOWN STRL REUS W/TWL XL LVL3 (GOWN DISPOSABLE) ×2 IMPLANT
GUIDEWIRE ORTH 900X3XBALL NOSE (WIRE) IMPLANT
INST SET MAJOR BONE (KITS) ×2 IMPLANT
KIT TURNOVER KIT A (KITS) ×2 IMPLANT
MANIFOLD NEPTUNE II (INSTRUMENTS) ×2 IMPLANT
MARKER SKIN DUAL TIP RULER LAB (MISCELLANEOUS) ×2 IMPLANT
NAIL FEM RETROGRADE 13X30 (Nail) IMPLANT
NAIL FEM RETROGRADE 13X40 (Nail) ×2 IMPLANT
NDL HYPO 21X1.5 SAFETY (NEEDLE) ×2 IMPLANT
NEEDLE HYPO 21X1.5 SAFETY (NEEDLE) ×2 IMPLANT
NS IRRIG 1000ML POUR BTL (IV SOLUTION) ×2 IMPLANT
PACK SRG BSC III STRL LF ECLPS (CUSTOM PROCEDURE TRAY) ×2 IMPLANT
PAD ARMBOARD POSITIONER FOAM (MISCELLANEOUS) ×2 IMPLANT
PENCIL SMOKE EVACUATOR COATED (MISCELLANEOUS) ×2 IMPLANT
PIN GUIDE THRD AR 3.2X330 (PIN) IMPLANT
POSITIONER HEAD 8X9X4 ADT (SOFTGOODS) ×2 IMPLANT
SCREW CANC CAPT FT 6.5X75 (Plate) IMPLANT
SCREW CANC CAPT FT 6.5X80 (Screw) IMPLANT
SCREW CORT CAPT FT 5.0X36 (Screw) IMPLANT
SET BASIN LINEN APH (SET/KITS/TRAYS/PACK) ×2 IMPLANT
SPONGE T-LAP 18X18 ~~LOC~~+RFID (SPONGE) ×4 IMPLANT
STOCKINETTE IMPERVIOUS LG (DRAPES) ×2 IMPLANT
STRIP CLOSURE SKIN 1/2X4 (GAUZE/BANDAGES/DRESSINGS) IMPLANT
SUT BRALON NAB BRD #1 30IN (SUTURE) ×2 IMPLANT
SUT MNCRL 0 VIOLET CTX 36 (SUTURE) IMPLANT
SUT MNCRL AB 4-0 PS2 18 (SUTURE) IMPLANT
SUT MON AB 0 CT1 (SUTURE) IMPLANT
SUT MON AB 2-0 CT1 36 (SUTURE) ×2 IMPLANT
SUT VIC AB 0 CT1 27XBRD ANTBC (SUTURE) IMPLANT
SYR 30ML LL (SYRINGE) ×2 IMPLANT
SYR BULB IRRIG 60ML STRL (SYRINGE) ×4 IMPLANT
WATER STERILE IRR 1000ML POUR (IV SOLUTION) ×4 IMPLANT
YANKAUER SUCT 12FT TUBE ARGYLE (SUCTIONS) ×2 IMPLANT
YANKAUER SUCT BULB TIP 10FT TU (MISCELLANEOUS) ×2 IMPLANT

## 2023-04-25 NOTE — Brief Op Note (Signed)
 04/25/2023  4:38 PM  PATIENT:  [name]  89 y.o. female  PRE-OPERATIVE DIAGNOSIS:  right femoral shaft fracture  POST-OPERATIVE DIAGNOSIS:  right femoral shaft fracture  PROCEDURE:    Intramedullary retrograde nail right femur  Hardware removal 2 screws right hip    SURGEON:  Surgeons and Role:    Vickki Hearing, MD - Primary    * Oliver Barre, MD - Assisting  PHYSICIAN ASSISTANT:   ASSISTANTS: none   ANESTHESIA:   spinal  EBL:  50 mL   BLOOD ADMINISTERED:none  DRAINS: none   LOCAL MEDICATIONS USED:  MARCAINE     SPECIMEN:  No Specimen  DISPOSITION OF SPECIMEN:  N/A  COUNTS:  YES  TOURNIQUET:  * No tourniquets in log *  DICTATION: .Dragon Dictation  PLAN OF CARE: Admit to inpatient   PATIENT DISPOSITION:  PACU - hemodynamically stable.   Delay start of Pharmacological VTE agent (>24hrs) due to surgical blood loss or risk of bleeding: yes

## 2023-04-25 NOTE — Progress Notes (Signed)
 Subjective: 2 Days Post-Op Procedure(s) (LRB): FIXATION, FRACTURE, INTERTROCHANTERIC, WITH INTRAMEDULLARY ROD (Left) Patient reports pain as  unable to assess .    Objective: Vital signs in last 24 hours: Temp:  [97.7 F (36.5 C)-98.9 F (37.2 C)] 98 F (36.7 C) (03/21 0727) Pulse Rate:  [71-99] 83 (03/21 0727) Resp:  [18-22] 20 (03/21 0457) BP: (115-138)/(36-79) 125/60 (03/21 0727) SpO2:  [94 %-100 %] 98 % (03/21 0727)  Intake/Output from previous day: 03/20 0701 - 03/21 0700 In: 400 [P.O.:400] Out: 350 [Urine:350] Intake/Output this shift: No intake/output data recorded.  Recent Labs    04/22/23 1440 04/24/23 0429 04/25/23 0414  HGB 10.9* 8.9* 8.0*   Recent Labs    04/24/23 0429 04/25/23 0414  WBC 8.0 9.5  RBC 3.08* 2.73*  HCT 29.6* 26.0*  PLT 290 292   Recent Labs    04/24/23 0429 04/25/23 0414  NA 141 140  K 4.9 4.2  CL 105 106  CO2 26 27  BUN 21 35*  CREATININE 0.73 0.77  GLUCOSE 203* 143*  CALCIUM 8.5* 7.6*   No results for input(s): "LABPT", "INR" in the last 72 hours.  Incision: dressing C/D/I Compartment soft   Assessment/Plan: 2 Days Post-Op Procedure(s) (LRB): FIXATION, FRACTURE, INTERTROCHANTERIC, WITH INTRAMEDULLARY ROD (Left)  Weight-bear as tolerated left lower extremity, patient is nonweightbearing  Right femur fracture scheduled for surgery today  Recommend transfusion prior to surgery      Fuller Canada 04/25/2023, 7:29 AM

## 2023-04-25 NOTE — Op Note (Addendum)
 Orthopaedic Surgery Operative Note (CSN: [id number])  [name]  14-Sep-1928 Date of Surgery: 04/25/2023   Diagnoses:  right femoral shaft fracture displaced spiral closed  Procedure: 27506  Open treatment internal fixation retrograde femoral nailing right femur  Hardware removal right hip   TXA [used/not used] yes   Operative Finding Closed fracture of the right femoral shaft with spiral configuration butterfly fragment with displacement and angulation   Post-Op Diagnosis: Same Surgeons:Primary: Vickki Hearing, MD Assisting: Oliver Barre, MD -Dr.CAIRNS was necessary to perform the essential parts of the surgery including traction countertraction and assist with reduction  Assistants: none Location: [facility] OR ROOM 4 Anesthesia: Spinal Antibiotics: Ancef 2 g Tourniquet time: No tourniquet Estimated Blood Loss: 100 cc Complications: None Specimens: None   Implants: Implant Name Type Inv. Item Serial No. Manufacturer Lot No. LRB No. Used Action  retrograde femoral nail 13mm x 30cm    [employer] [id number] Right 1 Implanted  SCREW CORTICAL SFTP 4.5X40MM - [id number] Screw SCREW CORTICAL SFTP 4.5X40MM  SMITH AND NEPHEW TRAUMA ON SET STERILE Right 2 Explanted  cancellous screw    [employer] [id number] Right 1 Implanted  SCREW CANC CAPT FT 6.5X75 - [id number] Plate SCREW CANC CAPT FT 6.5X75  [employer] [id number] Right 2 Implanted  SCREW CORT CAPT FT 5.0X36 - [id number] Screw SCREW CORT CAPT FT 5.0X36  [employer] [id number] Right 1 Implanted  SCREW CORT CAPT FT 5.0X36 - [id number] Screw SCREW CORT CAPT FT 5.0X36  [employer] [id number] Right 1 Implanted    BLOOD ADMINISTERED:none  DRAINS: none   LOCAL MEDICATIONS USED: Marcaine with epinephrine 30 cc   SPECIMEN:  No Specimen  DISPOSITION OF SPECIMEN:  N/A  COUNTS:  CORRECT   DICTATION: .Dragon Dictation   Surgeon Romeo Apple  The patient was taken to the recovery room in stable condition  PLAN OF CARE: After  PACU return to floor PATIENT DISPOSITION:  PACU - hemodynamically stable.   Delay start of Pharmacological VTE agent (>24hrs) due to surgical blood loss or risk of bleeding: Yes   Surgery was done in the following manner  The patient was identified in the preoperative holding area.  Thorough chart review was performed and the surgical site was confirmed as the right femur and this was marked.  The patient was taken to the operating room and spinal anesthetic was administered.  The Foley catheter was still in place from the prior surgery on Wednesday 2 days ago  The patient was placed on a regular OR table.  The C-arm was brought in and traction radiographs were performed.  The fracture was reduced with axial traction and manual manipulation at the fracture site.  We also confirmed that we could image the proximal part of the right femur.  Once this was done right lower extremity was prepped and draped in sterile manner.  Timeout was improved executed.  Antibiotics were in and TXA was also board  The skin incision was made over the right patellar tendon the subcutaneous tissue was divided the patella was split in line with its fibers.  A portion of the fat pad was excised.  The joint was irrigated and then a guidewire was placed in the center of the notch in line with the femoral canal and then lateral radiographs confirmed pin placement just above Blumensaat's line.  The reamer was then passed over the guidewire and then a long ball-tipped guidewire was placed across the FRACTURE site with axial traction.  AP and lateral images  confirmed wire placement all the way to the screws from the prior plate.  This measured 28 cm.  At this point we decided to proceed with removing the proximal 2 screws to enable [place] to pass a 30 cm nail.  The prior incision was opened at its distal end subcutaneous tissue was divided.  The fascia was split in line with skin incision.  Muscle dissection was performed bluntly  by finger dissection followed by exposing the plate with a soft tissue elevator.  The 2 distal screws were removed.  We then advanced the 13 x 30 cm nail across the fracture site under direct visualization with C arm up to the area where the nail would be buried approximately 5 mm  Once imaging confirmed position we made 3 distal incisions and drilled for 3 pins 1 transverse and then 2 cross pins  At this point the fracture site was again evaluated by x-ray.  We was still in good position in terms of reduction and we made a proximal incision and made to drill holes to place 2 proximal locking screws  Final imaging confirmed adequate reduction and position of hardware  All wounds were thoroughly irrigated including the joint  The main surgical wound was closed with #0 Vicryl to close the patellar tendon followed by 0 and 4-0 Monocryl to close the skin.  The proximal wound and 3 stab wounds were closed with 0 Monocryl and 2-0 Monocryl  Steri-Strips were used to approximate the skin edges sterile dressings were applied.

## 2023-04-25 NOTE — Transfer of Care (Signed)
 Immediate Anesthesia Transfer of Care Note  Patient: [name]  Procedure(s) Performed: INSERTION, INTRAMEDULLARY ROD, FEMUR, RETROGRADE (Right: Leg Upper) REMOVAL, HARDWARE, FEMUR (Right: Leg Lower)  Patient Location: PACU  Anesthesia Type:Spinal  Level of Consciousness: awake  Airway & Oxygen Therapy: Patient Spontanous Breathing  Post-op Assessment: Report given to RN and Post -op Vital signs reviewed and stable  Post vital signs: Reviewed and stable  Last Vitals:  Vitals Value Taken Time  BP 106/55   Temp 97.5   Pulse 95 04/25/23 1612  Resp 25 04/25/23 1612  SpO2 95% 04/25/23 1612  Vitals shown include unfiled device data.  Last Pain:  Vitals:   04/25/23 1137  TempSrc: Oral  PainSc: Asleep         Complications: No notable events documented.

## 2023-04-25 NOTE — Progress Notes (Signed)
   04/25/23 0258  Provider Notification  Provider Name/Title Dr. Thomes Dinning  Date Provider Notified 04/25/23  Time Provider Notified [phone]  Method of Notification Page  Notification Reason Critical Result  Test performed and critical result positive mrsa and staph swab  Date Critical Result Received 04/25/23  Time Critical Result Received 0338  Provider response See new orders

## 2023-04-25 NOTE — H&P (View-Only) (Signed)
 Subjective: 2 Days Post-Op Procedure(s) (LRB): FIXATION, FRACTURE, INTERTROCHANTERIC, WITH INTRAMEDULLARY ROD (Left) Patient reports pain as  unable to assess .    Objective: Vital signs in last 24 hours: Temp:  [97.7 F (36.5 C)-98.9 F (37.2 C)] 98 F (36.7 C) (03/21 0727) Pulse Rate:  [71-99] 83 (03/21 0727) Resp:  [18-22] 20 (03/21 0457) BP: (115-138)/(36-79) 125/60 (03/21 0727) SpO2:  [94 %-100 %] 98 % (03/21 0727)  Intake/Output from previous day: 03/20 0701 - 03/21 0700 In: 400 [P.O.:400] Out: 350 [Urine:350] Intake/Output this shift: No intake/output data recorded.  Recent Labs    04/22/23 1440 04/24/23 0429 04/25/23 0414  HGB 10.9* 8.9* 8.0*   Recent Labs    04/24/23 0429 04/25/23 0414  WBC 8.0 9.5  RBC 3.08* 2.73*  HCT 29.6* 26.0*  PLT 290 292   Recent Labs    04/24/23 0429 04/25/23 0414  NA 141 140  K 4.9 4.2  CL 105 106  CO2 26 27  BUN 21 35*  CREATININE 0.73 0.77  GLUCOSE 203* 143*  CALCIUM 8.5* 7.6*   No results for input(s): "LABPT", "INR" in the last 72 hours.  Incision: dressing C/D/I Compartment soft   Assessment/Plan: 2 Days Post-Op Procedure(s) (LRB): FIXATION, FRACTURE, INTERTROCHANTERIC, WITH INTRAMEDULLARY ROD (Left)  Weight-bear as tolerated left lower extremity, patient is nonweightbearing  Right femur fracture scheduled for surgery today  Recommend transfusion prior to surgery      Fuller Canada 04/25/2023, 7:29 AM

## 2023-04-25 NOTE — Progress Notes (Signed)
 Patient's son, [name] present and witnessed consent.  Verified that [name] was a DNR status and [name] acknowledged.  Placed purple DNR bracelet on patient.  [name] will be waiting for patient after surgery and can be reached at [phone]; other family members also present in the waiting room.  Invited other family members to come back to see patient prior to surgery, however, they politely declined when they found out she was sleeping.

## 2023-04-25 NOTE — Anesthesia Procedure Notes (Signed)
 Date/Time: 04/25/2023 1:47 PM  Performed by: Julian Reil, CRNAPre-anesthesia Checklist: Patient identified, Emergency Drugs available, Suction available and Patient being monitored Patient Re-evaluated:Patient Re-evaluated prior to induction Oxygen Delivery Method: Simple face mask Induction Type: IV induction Placement Confirmation: positive ETCO2

## 2023-04-25 NOTE — Plan of Care (Signed)

## 2023-04-25 NOTE — Anesthesia Postprocedure Evaluation (Deleted)
 Anesthesia Post Note  Patient: [name]  Procedure(s) Performed: INSERTION, INTRAMEDULLARY ROD, FEMUR, RETROGRADE (Right: Leg Upper) REMOVAL, HARDWARE, FEMUR (Right: Leg Lower)   There were no known notable events for this encounter.   Last Vitals:  Vitals:   04/25/23 1615 04/25/23 1626  BP: (!) 106/55   Pulse: 95 94  Resp: (!) 24   Temp:    SpO2: (!) 86% 94%    Last Pain:  Vitals:   04/25/23 1137  TempSrc: Oral  PainSc: Asleep                 Christian Borgerding L Cuauhtemoc Huegel

## 2023-04-25 NOTE — Progress Notes (Signed)
 PROGRESS NOTE    VESNA KABLE  GNF:[id number] DOB: 1929/01/09 DOA: 04/22/2023 PCP: Sharee Holster, NP   Brief Narrative:    [name] is a 89 y.o. female with medical history significant for dementia, hypertension, hyperthyroidism, respiratory failure on 2 L. Patient was brought to the ED from [facility] reports of hip fracture.  Patient underwent IM nail placement to left hip fracture on 3/19 and will need to have surgical intervention to right hip fracture on 3/21.  Assessment & Plan:   Principal Problem:   Closed pertrochanteric fracture of proximal end of left femur (HCC) Active Problems:   HTN (hypertension)   Goiter   Dementia (HCC)   Chronic respiratory failure with hypoxia (HCC)   Closed subtrochanteric fracture of hip, left, initial encounter (HCC)  Assessment and Plan:    Closed left hip fracture (HCC) X-ray shows proximal left femoral shaft fracture with varus angulation and displacement.  Patient is nonambulatory at baseline.  Has advanced dementia.  Also with bruising to left hand -Dr. Romeo Apple planning for IM nail today. -IV morphine 2 mg every 4 hourly as needed -X-ray of left hand without any acute findings -mildly tachycardic with dry mucous membranes, LR 500 bolus, continue , 75cc/hr x 20hrs  New right hip fracture -Plan for operative intervention 3/21 -Keep n.p.o.  Postoperative anemia -Continue to monitor CBC with no overt bleeding noted -Plan for 1 unit PRBC transfusion 3/21   Chronic respiratory failure with hypoxia (HCC) On 2 L chronically.  Chest x-ray negative for acute abnormality.   Dementia (HCC) Advanced vascular dementia, nursing home resident, needs assistance for all ADLs.  Not ambulatory.   Goiter Recent TSH 03/25/2023 -2.9.  T3 and T4 within normal limits. - Resume methimazole pending med reconciliation.    HTN (hypertension) Stable.  Not on antihypertensives.    DVT prophylaxis: SCDs Code Status: DNR Family  Communication: Son at bedside 3/20 Disposition Plan:  Status is: Inpatient Remains inpatient appropriate because: Need for inpatient procedure and IV medications.   Consultants:  Orthopedics  Procedures:  Left IM nail 3/19  Antimicrobials:  Anti-infectives (From admission, onward)    Start     Dose/Rate Route Frequency Ordered Stop   04/25/23 1145  ceFAZolin (ANCEF) IVPB 2g/100 mL premix        2 g 200 mL/hr over 30 Minutes Intravenous On call to O.R. 04/24/23 0901 04/26/23 0559   04/24/23 0600  ceFAZolin (ANCEF) IVPB 2g/100 mL premix        2 g 200 mL/hr over 30 Minutes Intravenous On call to O.R. 04/23/23 1419 04/23/23 1535   04/24/23 0031  ceFAZolin (ANCEF) IVPB 2g/100 mL premix        2 g 200 mL/hr over 30 Minutes Intravenous Every 6 hours 04/24/23 0031 04/24/23 1329   04/23/23 1425  ceFAZolin (ANCEF) 2-4 GM/100ML-% IVPB       Note to Pharmacy: Trenton Gammon S: cabinet override      04/23/23 1425 04/23/23 1536       Subjective: Patient seen and evaluated today and is currently somnolent.  PRBC transfusion ordered for this a.m.  She underwent IM nail to the left hip yesterday with plans for right hip fixation planned for 3/21.  Objective: Vitals:   04/25/23 0457 04/25/23 0727 04/25/23 0815 04/25/23 0830  BP: (!) 135/55 125/60 (!) 131/52 136/65  Pulse: 71 83 83 81  Resp: 20  17 17   Temp: 97.7 F (36.5 C) 98 F (36.7 C) 98.1 F (  36.7 C) 98.1 F (36.7 C)  TempSrc: Oral Axillary Oral Oral  SpO2: 100% 98% 98% 100%  Weight:      Height:        Intake/Output Summary (Last 24 hours) at 04/25/2023 1009 Last data filed at 04/25/2023 1324 Gross per 24 hour  Intake 400 ml  Output 350 ml  Net 50 ml   Filed Weights   04/22/23 1445 04/22/23 1853  Weight: 48 kg 48.1 kg    Examination:  General exam: Appears calm and comfortable, elderly/frail Respiratory system: Clear to auscultation. Respiratory effort normal.  Nasal cannula. Cardiovascular system: S1 & S2  heard, RRR.  Gastrointestinal system: Abdomen is soft Central nervous system: Somnolent Extremities: No edema Skin: No significant lesions noted Psychiatry: Flat affect.    Data Reviewed: I have personally reviewed following labs and imaging studies  CBC: Recent Labs  Lab 04/22/23 1440 04/24/23 0429 04/25/23 0414  WBC 10.8* 8.0 9.5  HGB 10.9* 8.9* 8.0*  HCT 34.4* 29.6* 26.0*  MCV 92.5 96.1 95.2  PLT 366 290 292   Basic Metabolic Panel: Recent Labs  Lab 04/22/23 1440 04/24/23 0429 04/25/23 0414  NA 141 141 140  K 3.9 4.9 4.2  CL 105 105 106  CO2 25 26 27   GLUCOSE 212* 203* 143*  BUN 27* 21 35*  CREATININE 0.72 0.73 0.77  CALCIUM 8.9 8.5* 7.6*  MG  --  2.0 2.2   GFR: Estimated Creatinine Clearance: 29.8 mL/min (by C-G formula based on SCr of 0.77 mg/dL). Liver Function Tests: No results for input(s): "AST", "ALT", "ALKPHOS", "BILITOT", "PROT", "ALBUMIN" in the last 168 hours. No results for input(s): "LIPASE", "AMYLASE" in the last 168 hours. No results for input(s): "AMMONIA" in the last 168 hours. Coagulation Profile: No results for input(s): "INR", "PROTIME" in the last 168 hours. Cardiac Enzymes: No results for input(s): "CKTOTAL", "CKMB", "CKMBINDEX", "TROPONINI" in the last 168 hours. BNP (last 3 results) No results for input(s): "PROBNP" in the last 8760 hours. HbA1C: No results for input(s): "HGBA1C" in the last 72 hours. CBG: Recent Labs  Lab 04/22/23 2144  GLUCAP 186*   Lipid Profile: No results for input(s): "CHOL", "HDL", "LDLCALC", "TRIG", "CHOLHDL", "LDLDIRECT" in the last 72 hours. Thyroid Function Tests: No results for input(s): "TSH", "T4TOTAL", "FREET4", "T3FREE", "THYROIDAB" in the last 72 hours. Anemia Panel: No results for input(s): "VITAMINB12", "FOLATE", "FERRITIN", "TIBC", "IRON", "RETICCTPCT" in the last 72 hours. Sepsis Labs: No results for input(s): "PROCALCITON", "LATICACIDVEN" in the last 168 hours.  Recent Results (from  the past 240 hours)  Surgical PCR screen     Status: Abnormal   Collection Time: 04/25/23  1:55 AM   Specimen: Nasal Mucosa; Nasal Swab  Result Value Ref Range Status   MRSA, PCR POSITIVE (A) NEGATIVE Final    Comment: RESULT CALLED TO, READ BACK BY AND VERIFIED WITH: ALLI BAILEY [id number], VIRAY,J    Staphylococcus aureus POSITIVE (A) NEGATIVE Final    Comment: RESULT CALLED TO, READ BACK BY AND VERIFIED WITH: Alfonso Patten [id number], VIRAY,J (NOTE) The Xpert SA Assay (FDA approved for NASAL specimens in patients 7 years of age and older), is one component of a comprehensive surveillance program. It is not intended to diagnose infection nor to guide or monitor treatment. Performed at [facility], [address] [place], [place] [address]          Radiology Studies: DG FEMUR MIN 2 VIEWS LEFT Result Date: 04/23/2023 CLINICAL DATA:  Postop femur fracture EXAM: LEFT  FEMUR 2 VIEWS; RIGHT FEMUR 2 VIEWS COMPARISON:  Same day radiographs and fluoroscopic images FINDINGS: LEFT: Expected postoperative change of intramedullary rod and screw fixation across a proximal left femur fracture. Cutaneous staples and soft tissue gas about the left hip. No radiographic evidence of loosening. No additional fractures. RIGHT: Fixation of the proximal right femur. No radiographic evidence of loosening. Acute oblique mildly displaced fracture in the mid to distal right femur. There is apex anterior angulation. IMPRESSION: 1. Acute oblique mildly angulated and displaced fracture of the distal RIGHT femur. 2. Expected postoperative change from ORIF of the proximal LEFT femur fracture. Electronically Signed   By: Minerva Fester M.D.   On: 04/23/2023 20:43   DG FEMUR, MIN 2 VIEWS RIGHT Result Date: 04/23/2023 CLINICAL DATA:  Postop femur fracture EXAM: LEFT FEMUR 2 VIEWS; RIGHT FEMUR 2 VIEWS COMPARISON:  Same day radiographs and fluoroscopic images FINDINGS: LEFT: Expected postoperative change of  intramedullary rod and screw fixation across a proximal left femur fracture. Cutaneous staples and soft tissue gas about the left hip. No radiographic evidence of loosening. No additional fractures. RIGHT: Fixation of the proximal right femur. No radiographic evidence of loosening. Acute oblique mildly displaced fracture in the mid to distal right femur. There is apex anterior angulation. IMPRESSION: 1. Acute oblique mildly angulated and displaced fracture of the distal RIGHT femur. 2. Expected postoperative change from ORIF of the proximal LEFT femur fracture. Electronically Signed   By: Minerva Fester M.D.   On: 04/23/2023 20:43   DG FEMUR MIN 2 VIEWS LEFT Result Date: 04/23/2023 CLINICAL DATA:  ORIF left hip fracture. EXAM: LEFT FEMUR 2 VIEWS FLUOROSCOPY TIME:  Fluoroscopy Time:  4 minutes 25 seconds Radiation Exposure Index (if provided by the fluoroscopic device): 45.95 mGy Number of Acquired Spot Images: 18 COMPARISON:  Radiographs 04/22/2023 FINDINGS: Multiple intraoperative fluoroscopic spot images are provided without a radiologist present during intramedullary rod and screw fixation across a proximal left femur fracture. IMPRESSION: Intraoperative fluoroscopic spot images during proximal left femur fracture fixation. Electronically Signed   By: Minerva Fester M.D.   On: 04/23/2023 20:39   DG C-Arm 1-60 Min-No Report Result Date: 04/23/2023 Fluoroscopy was utilized by the requesting physician.  No radiographic interpretation.        Scheduled Meds:  sodium chloride   Intravenous Once   celecoxib  200 mg Oral BID   Chlorhexidine Gluconate Cloth  6 each Topical Q0600   Chlorhexidine Gluconate Cloth  6 each Topical Daily   docusate sodium  100 mg Oral BID   feeding supplement  237 mL Oral BID BM   mupirocin ointment  1 Application Nasal BID   sodium chloride flush  3-10 mL Intravenous Q12H   traMADol  50 mg Oral Q6H   Continuous Infusions:   ceFAZolin (ANCEF) IV       LOS: 3 days     Time spent: 35 minutes    Maribell Demeo Hoover Brunette, DO [facility]  If 7PM-7AM, please contact night-coverage [email or web address] 04/25/2023, 10:09 AM

## 2023-04-25 NOTE — Anesthesia Postprocedure Evaluation (Signed)
 Anesthesia Post Note  Patient: MILANIA HAUBNER  Procedure(s) Performed: INSERTION, INTRAMEDULLARY ROD, FEMUR, RETROGRADE (Right: Leg Upper) REMOVAL, HARDWARE, FEMUR (Right: Leg Lower)  Patient location during evaluation: PACU Anesthesia Type: Spinal Level of consciousness: awake Pain management: pain level controlled Vital Signs Assessment: post-procedure vital signs reviewed and stable Respiratory status: spontaneous breathing, nonlabored ventilation, respiratory function stable and patient connected to nasal cannula oxygen Cardiovascular status: stable and blood pressure returned to baseline Postop Assessment: no headache, spinal receding and no apparent nausea or vomiting Anesthetic complications: no   There were no known notable events for this encounter.   Last Vitals:  Vitals:   04/25/23 1615 04/25/23 1626  BP: (!) 106/55   Pulse: 95 94  Resp: (!) 24   Temp:    SpO2: 100% 94%    Last Pain:  Vitals:   04/25/23 1137  TempSrc: Oral  PainSc: Asleep                 Mccade Sullenberger L Diedre Maclellan

## 2023-04-25 NOTE — TOC Progression Note (Signed)
 Transition of Care [facility]) - Progression Note    Patient Details  Name: [name] MRN: [id number] Date of Birth: 20-Sep-1928  [facility] CM/SW Contact  Elliot Gault, LCSW Phone Number: 04/25/2023, 9:54 AM  Clinical Narrative:     TOC following. Pt having surgery today. Per MD, earliest possible dc will be Sunday. Updated Tammy at [facility] and they can readmit pt over the weekend if she is stable for dc. RN can call report to [phone].   Weekend TOC will follow.  Expected Discharge Plan: Skilled Nursing Facility Barriers to Discharge: Continued Medical Work up  Expected Discharge Plan and Services In-house Referral: Clinical Social Work   Post Acute Care Choice: Skilled Nursing Facility Living arrangements for the past 2 months: Skilled Nursing Facility                                       Social Determinants of Health (SDOH) Interventions SDOH Screenings   Food Insecurity: Patient Declined (04/23/2023)  Housing: Patient Declined (04/23/2023)  Transportation Needs: Patient Declined (04/23/2023)  Utilities: Patient Declined (04/23/2023)  Depression (PHQ2-9): Low Risk  (01/22/2023)  Social Connections: Patient Declined (04/23/2023)  Tobacco Use: Low Risk  (04/23/2023)    Readmission Risk Interventions     No data to display

## 2023-04-25 NOTE — Anesthesia Procedure Notes (Signed)
 Spinal  Start time: 04/25/2023 1:35 PM End time: 04/25/2023 1:45 PM Reason for block: surgical anesthesia Staffing Performed: anesthesiologist  Anesthesiologist: Ronelle Nigh, MD Performed by: Ronelle Nigh, MD Authorized by: Ronelle Nigh, MD   Preanesthetic Checklist Completed: patient identified, IV checked, site marked, risks and benefits discussed, surgical consent, monitors and equipment checked, pre-op evaluation and timeout performed Spinal Block Patient position: right lateral decubitus Prep: Betadine Patient monitoring: cardiac monitor, heart rate, continuous pulse ox and blood pressure Approach: midline Location: L4-5 Injection technique: single-shot Needle Needle type: Pencil-Tip  Needle gauge: 25 G Needle length: 9 cm Needle insertion depth: 3 cm Assessment Sensory level: T10 Events: CSF return Additional Notes Injected 1.6 cc .75% bupivicaine with 15 mcg fentanyl

## 2023-04-25 NOTE — Anesthesia Preprocedure Evaluation (Signed)
 Anesthesia Evaluation  Patient identified by MRN, date of birth, ID band Patient awake    Reviewed: Allergy & Precautions, H&P , NPO status , Patient's Chart, lab work & pertinent test results  Airway Mallampati: I  TM Distance: >3 FB     Dental  (+) Edentulous Upper, Missing Only a few lower teeth left:   Pulmonary shortness of breath, COPD,  oxygen dependent History of chronic respiratory failure with hypoxia but no diagnosis of COPD?  2 L/M O2   breath sounds clear to auscultation       Cardiovascular hypertension, Pt. on medications + angina  Normal cardiovascular exam Rhythm:Regular Rate:Normal     Neuro/Psych  PSYCHIATRIC DISORDERS Anxiety Depression   Dementia    GI/Hepatic ,GERD  ,,  Endo/Other  diabetes, Type 2 Hyperthyroidism   Renal/GU Renal disease     Musculoskeletal  (+) Arthritis , Osteoarthritis,    Abdominal   Peds  Hematology  (+) Blood dyscrasia, anemia Hgb 8.0   Anesthesia Other Findings   Reproductive/Obstetrics                             Anesthesia Physical Anesthesia Plan  ASA: 4  Anesthesia Plan: Spinal   Post-op Pain Management: Dilaudid IV   Induction:   PONV Risk Score and Plan: Propofol infusion  Airway Management Planned: Nasal Cannula, Natural Airway and Simple Face Mask  Additional Equipment: None  Intra-op Plan:   Post-operative Plan:   Informed Consent: I have reviewed the patients History and Physical, chart, labs and discussed the procedure including the risks, benefits and alternatives for the proposed anesthesia with the patient or authorized representative who has indicated his/her understanding and acceptance.     Dental advisory given and Consent reviewed with POA  Plan Discussed with: CRNA and Surgeon  Anesthesia Plan Comments:         Anesthesia Quick Evaluation

## 2023-04-25 NOTE — Interval H&P Note (Signed)
 History and Physical Interval Note:  04/25/2023 12:42 PM  [name]  has presented today for surgery, with the diagnosis of right femoral shaft fracture.  The various methods of treatment have been discussed with the patient and family. After consideration of risks, benefits and other options for treatment, the patient has consented to  Procedure(s) with comments: INSERTION, INTRAMEDULLARY ROD, FEMUR, RETROGRADE (Right) - arhtrx nail retrograde femoral   smith nephew dhs removal may be needed; keeling placed yrs ago as a surgical intervention.  The patient's history has been reviewed, patient examined, no change in status, stable for surgery.  I have reviewed the patient's chart and labs.  Questions were answered to the patient's satisfaction.     Fuller Canada

## 2023-04-26 DIAGNOSIS — S72102A Unspecified trochanteric fracture of left femur, initial encounter for closed fracture: Secondary | ICD-10-CM | POA: Diagnosis not present

## 2023-04-26 LAB — BASIC METABOLIC PANEL
Anion gap: 8 (ref 5–15)
BUN: 22 mg/dL (ref 8–23)
CO2: 25 mmol/L (ref 22–32)
Calcium: 8.5 mg/dL — ABNORMAL LOW (ref 8.9–10.3)
Chloride: 108 mmol/L (ref 98–111)
Creatinine, Ser: 0.56 mg/dL (ref 0.44–1.00)
GFR, Estimated: >60 mL/min (ref >60)
Glucose, Bld: 151 mg/dL — ABNORMAL HIGH (ref 70–99)
Potassium: 3.9 mmol/L (ref 3.5–5.1)
Sodium: 141 mmol/L (ref 135–145)

## 2023-04-26 LAB — CBC
HCT: 28.6 % — ABNORMAL LOW (ref 36.0–46.0)
Hemoglobin: 8.8 g/dL — ABNORMAL LOW (ref 12.0–15.0)
MCH: 28.6 pg (ref 26.0–34.0)
MCHC: 30.8 g/dL (ref 30.0–36.0)
MCV: 92.9 fL (ref 80.0–100.0)
Platelets: 267 10*3/uL (ref 150–400)
RBC: 3.08 MIL/uL — ABNORMAL LOW (ref 3.87–5.11)
RDW: 14.9 % (ref 11.5–15.5)
WBC: 8.9 10*3/uL (ref 4.0–10.5)
nRBC: 0 % (ref 0.0–0.2)

## 2023-04-26 LAB — MAGNESIUM: Magnesium: 2 mg/dL (ref 1.7–2.4)

## 2023-04-26 MED ORDER — METOCLOPRAMIDE HCL 10 MG PO TABS: 5.0000 mg | ORAL_TABLET | Freq: Three times a day (TID) | ORAL | Status: DC | PRN

## 2023-04-26 MED ORDER — ONDANSETRON HCL 4 MG PO TABS: 4.0000 mg | ORAL_TABLET | Freq: Four times a day (QID) | ORAL | Status: DC | PRN

## 2023-04-26 MED ORDER — TRANEXAMIC ACID-NACL 1000-0.7 MG/100ML-% IV SOLN
1000.0000 mg | Freq: Once | INTRAVENOUS | Status: AC
  Administered 2023-04-26: 1000 mg via INTRAVENOUS
  Filled 2023-04-26: qty 100

## 2023-04-26 MED ORDER — METOCLOPRAMIDE HCL 5 MG/ML IJ SOLN: 5.0000 mg | Freq: Three times a day (TID) | INTRAMUSCULAR | Status: DC | PRN

## 2023-04-26 MED ORDER — ONDANSETRON HCL 4 MG/2ML IJ SOLN: 4.0000 mg | Freq: Four times a day (QID) | INTRAMUSCULAR | Status: DC | PRN

## 2023-04-26 MED ORDER — DOCUSATE SODIUM 100 MG PO CAPS
100.0000 mg | ORAL_CAPSULE | Freq: Two times a day (BID) | ORAL | Status: DC
  Administered 2023-04-26 – 2023-04-28 (×3): 100 mg via ORAL
  Filled 2023-04-26 (×4): qty 1

## 2023-04-26 MED ORDER — CEFAZOLIN SODIUM-DEXTROSE 2-4 GM/100ML-% IV SOLN
2.0000 g | Freq: Four times a day (QID) | INTRAVENOUS | Status: AC
  Administered 2023-04-26 (×2): 2 g via INTRAVENOUS
  Filled 2023-04-26 (×2): qty 100

## 2023-04-26 MED ORDER — BISACODYL 5 MG PO TBEC: 5.0000 mg | DELAYED_RELEASE_TABLET | Freq: Every day | ORAL | Status: DC | PRN

## 2023-04-26 MED ORDER — ENOXAPARIN SODIUM 30 MG/0.3ML IJ SOSY
30.0000 mg | PREFILLED_SYRINGE | INTRAMUSCULAR | Status: DC
  Administered 2023-04-26 – 2023-04-28 (×3): 30 mg via SUBCUTANEOUS
  Filled 2023-04-26 (×3): qty 0.3

## 2023-04-26 MED ORDER — METHIMAZOLE 5 MG PO TABS
5.0000 mg | ORAL_TABLET | Freq: Every day | ORAL | Status: DC
  Administered 2023-04-26 – 2023-04-28 (×3): 5 mg via ORAL
  Filled 2023-04-26 (×3): qty 1

## 2023-04-26 MED ORDER — SODIUM CHLORIDE 0.9 % IV SOLN: INTRAVENOUS | Status: AC

## 2023-04-26 NOTE — Evaluation (Addendum)
 Physical Therapy Evaluation Patient Details Name: [name] MRN: [id number] DOB: 11/29/1928 Today's Date: 04/26/2023  History of Present Illness  [name] is a 89 y.o. female with medical history significant for dementia, hypertension, hyperthyroidism, respiratory failure on 2 L.  Patient was brought to the ED from [facility], [place] reports of hip fracture.  Per nursing home notes, on Saturday- 3/15, patient was getting out of bed with lift, per notes she swung her arms out causing a skin tear on her left forearm, there was no indication of pain at that time, this morning she was complaining of left hip pain. Left lower extremity shortening and external rotation was noted hence x-ray was done which showed femur fracture.  At time of my evaluation, patient's son Tammy Sours is at bedside, daughter and granddaughter Lurena Joiner - Charity fundraiser, who works here at [employer] in radiology imaging department are at bedside.  Patient has advanced dementia, is not ambulatory, is able to recognize family, but not able to hold conversation. s/p left femur fx 3/19; interop femur fx on right and s/p right IM nail 3/21    Clinical Impression  Patient lying in bed on therapist arrival.  Initially sleeping but rouses easily with verbal greeting.  She is able to answer some Yes and No questions but further speech is garbled.  PT performed gentle PROM to bilater lower extremities and upper extremities per MD order with caution that patient has "very brittle bones".  Of note she presents with left leg internally rotated and guards her left upper extremity especially.  Patient with grimacing and occasional moans throughout assessment.  Did not attempt sitting up on the edge of the bed due to pain. Per Dr. Romeo Apple, patient is NWBing bilateral lower extremities.  Patient left in bed with call button in reach and nursing notified of mobility status  Patient will benefit from continued skilled therapy services during the remainder of her hospital  stay and at the next recommended venue of care to address deficits and promote return to optimal function.            If plan is discharge home, recommend the following: A lot of help with bathing/dressing/bathroom;A lot of help with walking and/or transfers   Can travel by private vehicle   No    Equipment Recommendations None recommended by PT  Recommendations for Other Services       Functional Status Assessment Patient has had a recent decline in their functional status and demonstrates the ability to make significant improvements in function in a reasonable and predictable amount of time.     Precautions / Restrictions Precautions Precautions: Fall;Other (comment) (very brittle bones) Recall of Precautions/Restrictions: Impaired Precaution/Restrictions Comments: patient with dementia Restrictions Weight Bearing Restrictions Per Provider Order: Yes RLE Weight Bearing Per Provider Order: Non weight bearing LLE Weight Bearing Per Provider Order: Non weight bearing      Mobility  Bed Mobility Overal bed mobility: Needs Assistance Bed Mobility: Rolling Rolling: Total assist           Patient Response: Cooperative  Transfers                   General transfer comment: did not attempt transfer due to patient pain and guarding; NWBing status    Ambulation/Gait               General Gait Details: patinet non ambulatory at baseline  Stairs            Wheelchair  Mobility     Tilt Bed Tilt Bed Patient Response: Cooperative  Modified Rankin (Stroke Patients Only)       Balance Overall balance assessment: Needs assistance     Sitting balance - Comments: did not attempt sitting up                                     Pertinent Vitals/Pain Pain Assessment Pain Assessment: Faces Pain Score: 6  Faces Pain Scale: Hurts even more Breathing: normal Negative Vocalization: occasional moan/groan, low speech,  negative/disapproving quality Facial Expression: sad, frightened, frown Facial Expression: Grimacing Body Movements: Protection Pain Location: guarding of both lower extremities and left upper extremity Pain Descriptors / Indicators: Guarding, Grimacing, Moaning Pain Intervention(s): Limited activity within patient's tolerance, Monitored during session    Home Living Family/patient expects to be discharged to:: Skilled nursing facility                   Additional Comments: patient unable to answer more than yes no questions and unsure of patient accuracy    Prior Function Prior Level of Function : Needs assist;History of Falls (last six months)  Cognitive Assist : Mobility (cognitive);ADLs (cognitive)     Physical Assist : Mobility (physical);ADLs (physical)   ADLs (physical): Feeding;Grooming;Bathing;Dressing;Toileting;IADLs Mobility Comments: non ambulatory at baseline ADLs Comments: needs assist for all ADLs     Extremity/Trunk Assessment   Upper Extremity Assessment Upper Extremity Assessment: Right hand dominant    Lower Extremity Assessment Lower Extremity Assessment: LLE deficits/detail;RLE deficits/detail RLE Deficits / Details: pain, guarding LLE Deficits / Details: pain, guarding, left leg interally rotated       Communication   Communication Communication: Other (comment) Factors Affecting Communication: Reduced clarity of speech;Difficulty expressing self    Cognition Arousal: Alert Behavior During Therapy: WFL for tasks assessed/performed   PT - Cognitive impairments: History of cognitive impairments, No family/caregiver present to determine baseline                         Following commands: Impaired       Cueing       General Comments      Exercises     Assessment/Plan    PT Assessment Patient needs continued PT services  PT Problem List Decreased strength;Decreased activity tolerance;Decreased range of  motion;Decreased mobility;Pain       PT Treatment Interventions Functional mobility training;Therapeutic activities;Therapeutic exercise;Patient/family education    PT Goals (Current goals can be found in the Care Plan section)  Acute Rehab PT Goals PT Goal Formulation: Patient unable to participate in goal setting Time For Goal Achievement: 05/10/23 Potential to Achieve Goals: Fair    Frequency Min 2X/week     Co-evaluation               AM-PAC PT "6 Clicks" Mobility  Outcome Measure Help needed turning from your back to your side while in a flat bed without using bedrails?: Total Help needed moving from lying on your back to sitting on the side of a flat bed without using bedrails?: Total Help needed moving to and from a bed to a chair (including a wheelchair)?: Total Help needed standing up from a chair using your arms (e.g., wheelchair or bedside chair)?: Total Help needed to walk in hospital room?: Total Help needed climbing 3-5 steps with a railing? : Total 6 Click Score: 6  End of Session   Activity Tolerance: Patient limited by pain Patient left: in bed;with bed alarm set;with call bell/phone within reach Nurse Communication: Mobility status PT Visit Diagnosis: Repeated falls (R29.6);Muscle weakness (generalized) (M62.81);History of falling (Z91.81)    Time:  -      Charges:   PT Evaluation $PT Eval Moderate Complexity: 1 Mod   PT General Charges $$ ACUTE PT VISIT: 1 Visit         9:55 AM, 04/26/23 Yailine Ballard Small Dezmon Conover MPT [facility] [place] [phone] Ph:[phone]

## 2023-04-26 NOTE — Progress Notes (Signed)
 Subjective: 1 Day Post-Op Procedure(s) (LRB): INSERTION, INTRAMEDULLARY ROD, FEMUR, RETROGRADE (Right) REMOVAL, HARDWARE, FEMUR (Right) Patient reports pain as  patient could not express any numbers .    Objective: Vital signs in last 24 hours: Temp:  [97.5 F (36.4 C)-98.6 F (37 C)] 97.7 F (36.5 C) (03/22 0348) Pulse Rate:  [79-115] 96 (03/22 0348) Resp:  [16-24] 19 (03/22 0348) BP: (105-133)/(48-72) 125/48 (03/22 0348) SpO2:  [94 %-100 %] 95 % (03/22 0348) FiO2 (%):  [99 %] 99 % (03/21 1137) Weight:  [48.1 kg] 48.1 kg (03/21 1137)  Intake/Output from previous day: 03/21 0701 - 03/22 0700 In: 1232.1 [I.V.:886.8; IV Piggyback:345.4] Out: 1000 [Urine:950; Blood:50] Intake/Output this shift: No intake/output data recorded.  Recent Labs    04/24/23 0429 04/25/23 0414 04/26/23 0341  HGB 8.9* 8.0* 8.8*   Recent Labs    04/25/23 0414 04/26/23 0341  WBC 9.5 8.9  RBC 2.73* 3.08*  HCT 26.0* 28.6*  PLT 292 267   Recent Labs    04/25/23 0414 04/26/23 0341  NA 140 141  K 4.2 3.9  CL 106 108  CO2 27 25  BUN 35* 22  CREATININE 0.77 0.56  GLUCOSE 143* 151*  CALCIUM 7.6* 8.5*   No results for input(s): "LABPT", "INR" in the last 72 hours.  Physical Exam Constitutional:      General: She is awake.     Appearance: She is underweight. She is not toxic-appearing or diaphoretic.  Cardiovascular:     Rate and Rhythm: Normal rate and regular rhythm.  Pulmonary:     Effort: Pulmonary effort is normal.     Breath sounds: No wheezing.  Musculoskeletal:     Comments: Right lower extremity dressings are intact status post intramedullary nailing via retrograde technique for right femur fracture  Left lower extremity dressing clean dry and intact, left leg internally rotated approximately 20 degrees.  - Physician is aware.    Neurological:     Mental Status: She is alert.       Assessment/Plan: 1 Day Post-Op Procedure(s) (LRB): INSERTION, INTRAMEDULLARY ROD,  FEMUR, RETROGRADE (Right) REMOVAL, HARDWARE, FEMUR (Right)  3-day postop left peritrochanteric fracture status post insertion intramedullary rod  Left lower extremity internal rotation.  Physician is aware of the internal rotation of the left lower extremity.  This is less notable with the foot dorsiflexed to its neutral position.  Intraoperatively decision was made to accept this position to get the best fracture apposition.   Plan  Monitor hemoglobin Physical therapy bed to chair Range of motion exercises to prevent contracture Extremity strengthening exercises as tolerated      Fuller Canada 04/26/2023, 8:53 AM

## 2023-04-26 NOTE — Progress Notes (Signed)
 PROGRESS NOTE    [name]  FAO:[id number] DOB: 07/04/1928 DOA: 04/22/2023 PCP: Sharee Holster, NP   Brief Narrative:    [name] is a 89 y.o. female with medical history significant for dementia, hypertension, hyperthyroidism, respiratory failure on 2 L. Patient was brought to the ED from [facility] reports of hip fracture.  Patient underwent IM nail placement to left hip fracture on 3/19 and and also had right femur fracture repair 3/21.  Assessment & Plan:   Principal Problem:   Closed pertrochanteric fracture of proximal end of left femur (HCC) Active Problems:   HTN (hypertension)   Goiter   Dementia (HCC)   Chronic respiratory failure with hypoxia (HCC)   Closed subtrochanteric fracture of hip, left, initial encounter (HCC)   Closed displaced spiral fracture of shaft of right femur (HCC)  Assessment and Plan:    Closed left hip fracture (HCC) X-ray shows proximal left femoral shaft fracture with varus angulation and displacement.  Patient is nonambulatory at baseline.  Has advanced dementia.  Also with bruising to left hand -Dr. Romeo Apple planning for IM nail today. -IV morphine 2 mg every 4 hourly as needed -X-ray of left hand without any acute findings -mildly tachycardic with dry mucous membranes, LR 500 bolus, continue , 75cc/hr x 20hrs  New right femur fracture -Status post fixation 3/21 -Undergoing PT, plan for discharge back to SNF  Postoperative anemia -Continue to monitor CBC with no overt bleeding noted -Plan for 1 unit PRBC transfusion 3/21   Chronic respiratory failure with hypoxia (HCC) On 2 L chronically.  Chest x-ray negative for acute abnormality.   Dementia (HCC) Advanced vascular dementia, nursing home resident, needs assistance for all ADLs.  Not ambulatory.   Goiter Recent TSH 03/25/2023 -2.9.  T3 and T4 within normal limits. - Resume methimazole    HTN (hypertension) Stable.  Not on antihypertensives.    DVT prophylaxis:  SCDs Code Status: DNR Family Communication: Son at bedside 3/20 Disposition Plan:  Status is: Inpatient Remains inpatient appropriate because: Need for inpatient procedure and IV medications.   Consultants:  Orthopedics  Procedures:  Left IM nail 3/19 Right IM nail 3/21  Antimicrobials:  Anti-infectives (From admission, onward)    Start     Dose/Rate Route Frequency Ordered Stop   04/26/23 0036  ceFAZolin (ANCEF) IVPB 2g/100 mL premix        2 g 200 mL/hr over 30 Minutes Intravenous Every 6 hours 04/26/23 0036 04/26/23 0700   04/25/23 1145  ceFAZolin (ANCEF) IVPB 2g/100 mL premix        2 g 200 mL/hr over 30 Minutes Intravenous On call to O.R. 04/25/23 1104 04/25/23 1409   04/25/23 1145  ceFAZolin (ANCEF) IVPB 2g/100 mL premix  Status:  Discontinued        2 g 200 mL/hr over 30 Minutes Intravenous On call to O.R. 04/24/23 0901 04/25/23 1107   04/24/23 0600  ceFAZolin (ANCEF) IVPB 2g/100 mL premix        2 g 200 mL/hr over 30 Minutes Intravenous On call to O.R. 04/23/23 1419 04/23/23 1535   04/24/23 0031  ceFAZolin (ANCEF) IVPB 2g/100 mL premix        2 g 200 mL/hr over 30 Minutes Intravenous Every 6 hours 04/24/23 0031 04/24/23 1329   04/23/23 1425  ceFAZolin (ANCEF) 2-4 GM/100ML-% IVPB       Note to Pharmacy: Trenton Gammon S: cabinet override      04/23/23 1425 04/23/23 1536  Subjective: Patient seen and evaluated today and is being seen by PT.  No acute overnight events noted.  Objective: Vitals:   04/25/23 1749 04/25/23 2100 04/25/23 2314 04/26/23 0348  BP: 133/60 122/72 105/60 (!) 125/48  Pulse: (!) 104 (!) 112 (!) 115 96  Resp: 19 16 20 19   Temp: 97.7 F (36.5 C) 98.3 F (36.8 C) (!) 97.5 F (36.4 C) 97.7 F (36.5 C)  TempSrc: Axillary Oral Axillary Axillary  SpO2: 96% 96% 95% 95%  Weight:      Height:        Intake/Output Summary (Last 24 hours) at 04/26/2023 1019 Last data filed at 04/26/2023 0649 Gross per 24 hour  Intake 1232.12 ml   Output 1000 ml  Net 232.12 ml   Filed Weights   04/22/23 1445 04/22/23 1853 04/25/23 1137  Weight: 48 kg 48.1 kg 48.1 kg    Examination:  General exam: Appears calm and comfortable, elderly/frail Respiratory system: Clear to auscultation. Respiratory effort normal.  Nasal cannula. Cardiovascular system: S1 & S2 heard, RRR.  Gastrointestinal system: Abdomen is soft Central nervous system: Somnolent Extremities: No edema Skin: No significant lesions noted Psychiatry: Flat affect.    Data Reviewed: I have personally reviewed following labs and imaging studies  CBC: Recent Labs  Lab 04/22/23 1440 04/24/23 0429 04/25/23 0414 04/26/23 0341  WBC 10.8* 8.0 9.5 8.9  HGB 10.9* 8.9* 8.0* 8.8*  HCT 34.4* 29.6* 26.0* 28.6*  MCV 92.5 96.1 95.2 92.9  PLT 366 290 292 267   Basic Metabolic Panel: Recent Labs  Lab 04/22/23 1440 04/24/23 0429 04/25/23 0414 04/26/23 0341  NA 141 141 140 141  K 3.9 4.9 4.2 3.9  CL 105 105 106 108  CO2 25 26 27 25   GLUCOSE 212* 203* 143* 151*  BUN 27* 21 35* 22  CREATININE 0.72 0.73 0.77 0.56  CALCIUM 8.9 8.5* 7.6* 8.5*  MG  --  2.0 2.2 2.0   GFR: Estimated Creatinine Clearance: 29.8 mL/min (by C-G formula based on SCr of 0.56 mg/dL). Liver Function Tests: No results for input(s): "AST", "ALT", "ALKPHOS", "BILITOT", "PROT", "ALBUMIN" in the last 168 hours. No results for input(s): "LIPASE", "AMYLASE" in the last 168 hours. No results for input(s): "AMMONIA" in the last 168 hours. Coagulation Profile: No results for input(s): "INR", "PROTIME" in the last 168 hours. Cardiac Enzymes: No results for input(s): "CKTOTAL", "CKMB", "CKMBINDEX", "TROPONINI" in the last 168 hours. BNP (last 3 results) No results for input(s): "PROBNP" in the last 8760 hours. HbA1C: No results for input(s): "HGBA1C" in the last 72 hours. CBG: Recent Labs  Lab 04/22/23 2144  GLUCAP 186*   Lipid Profile: No results for input(s): "CHOL", "HDL", "LDLCALC",  "TRIG", "CHOLHDL", "LDLDIRECT" in the last 72 hours. Thyroid Function Tests: No results for input(s): "TSH", "T4TOTAL", "FREET4", "T3FREE", "THYROIDAB" in the last 72 hours. Anemia Panel: No results for input(s): "VITAMINB12", "FOLATE", "FERRITIN", "TIBC", "IRON", "RETICCTPCT" in the last 72 hours. Sepsis Labs: No results for input(s): "PROCALCITON", "LATICACIDVEN" in the last 168 hours.  Recent Results (from the past 240 hours)  Surgical PCR screen     Status: Abnormal   Collection Time: 04/25/23  1:55 AM   Specimen: Nasal Mucosa; Nasal Swab  Result Value Ref Range Status   MRSA, PCR POSITIVE (A) NEGATIVE Final    Comment: RESULT CALLED TO, READ BACK BY AND VERIFIED WITH: ALLI BAILEY [id number] [id number], VIRAY,J    Staphylococcus aureus POSITIVE (A) NEGATIVE Final    Comment: RESULT CALLED TO, READ  BACK BY AND VERIFIED WITH: Alfonso Patten [phone], VIRAY,J (NOTE) The Xpert SA Assay (FDA approved for NASAL specimens in patients 58 years of age and older), is one component of a comprehensive surveillance program. It is not intended to diagnose infection nor to guide or monitor treatment. Performed at [facility], [address] [place], [place] [address]          Radiology Studies: DG FEMUR, [place] 2 VIEWS RIGHT Result Date: 04/25/2023 CLINICAL DATA:  Femur fracture, postop. EXAM: RIGHT FEMUR 2 VIEWS COMPARISON:  Preoperative radiograph. FINDINGS: Femoral intramedullary nail with distal and locking screw fixation traverse femoral shaft fracture. Previous surgical hardware in the proximal femur. Recent postsurgical change includes air and edema in the soft tissues. IMPRESSION: ORIF of femoral shaft fracture without immediate postoperative complication. Electronically Signed   By: Narda Rutherford M.D.   On: 04/25/2023 19:08   DG HIP UNILAT WITH PELVIS 2-3 VIEWS RIGHT Result Date: 04/25/2023 CLINICAL DATA:  Intertrochanteric fracture right femur. Intramedullary nail. EXAM: DG HIP (WITH OR  WITHOUT PELVIS) 2-3V RIGHT COMPARISON:  Right femur radiographs 04/23/2023 FINDINGS: Images were performed intraoperatively without the presence of a radiologist. The patient is undergoing long intramedullary nail fixation of the right femur, spanning the previously seen oblique fracture of the distal femoral diaphysis. There are 3 distal interlocking screws. More remote proximal femoral dynamic hip screw also noted on prior radiographs, partially visualized on the current fluoroscopy images. Total fluoroscopy images: 12 Total fluoroscopy time: 346 seconds of fluoroscopy Total dose: Radiation Exposure Index (as provided by the fluoroscopic device): 31.2 mGy air Kerma Please see intraoperative findings for further detail. IMPRESSION: Intraoperative fluoroscopy for long intramedullary nail fixation of the right femur. Electronically Signed   By: Neita Garnet M.D.   On: 04/25/2023 16:41   DG C-Arm 1-60 Min-No Report Result Date: 04/25/2023 Fluoroscopy was utilized by the requesting physician.  No radiographic interpretation.   DG C-Arm 1-60 Min-No Report Result Date: 04/25/2023 Fluoroscopy was utilized by the requesting physician.  No radiographic interpretation.        Scheduled Meds:  sodium chloride   Intravenous Once   [COMPLETED] sodium chloride   Intravenous Once   celecoxib  200 mg Oral BID   Chlorhexidine Gluconate Cloth  6 each Topical Q0600   Chlorhexidine Gluconate Cloth  6 each Topical Daily   docusate sodium  100 mg Oral BID   enoxaparin (LOVENOX) injection  30 mg Subcutaneous Q24H   feeding supplement  237 mL Oral BID BM   mupirocin ointment  1 Application Nasal BID   sodium chloride flush  3-10 mL Intravenous Q12H   traMADol  50 mg Oral Q6H   Continuous Infusions:  sodium chloride 40 mL/hr at 04/26/23 0050     LOS: 4 days    Time spent: 35 minutes    Hezekiah Veltre Hoover Brunette, DO [facility]  If 7PM-7AM, please contact night-coverage [email or web address] 04/26/2023, 10:19 AM

## 2023-04-26 NOTE — Plan of Care (Signed)
  Problem: Acute Rehab PT Goals(only PT should resolve) Goal: Pt will Roll Supine to Side Outcome: Progressing Flowsheets (Taken 04/26/2023 0955) Pt will Roll Supine to Side: with max assist Goal: Pt Will Go Supine/Side To Sit Outcome: Progressing Flowsheets (Taken 04/26/2023 0955) Pt will go Supine/Side to Sit: with maximum assist Goal: Pt Will Go Sit To Supine/Side Outcome: Progressing Flowsheets (Taken 04/26/2023 0955) Pt will go Sit to Supine/Side: with maximum assist Goal: Patient Will Perform Sitting Balance Outcome: Progressing Flowsheets (Taken 04/26/2023 0955) Patient will perform sitting balance: with maximum assist

## 2023-04-27 DIAGNOSIS — S72102A Unspecified trochanteric fracture of left femur, initial encounter for closed fracture: Secondary | ICD-10-CM | POA: Diagnosis not present

## 2023-04-27 LAB — CBC
HCT: 27.8 % — ABNORMAL LOW (ref 36.0–46.0)
Hemoglobin: 8.6 g/dL — ABNORMAL LOW (ref 12.0–15.0)
MCH: 29.3 pg (ref 26.0–34.0)
MCHC: 30.9 g/dL (ref 30.0–36.0)
MCV: 94.6 fL (ref 80.0–100.0)
Platelets: 240 10*3/uL (ref 150–400)
RBC: 2.94 MIL/uL — ABNORMAL LOW (ref 3.87–5.11)
RDW: 14.6 % (ref 11.5–15.5)
WBC: 9.7 10*3/uL (ref 4.0–10.5)
nRBC: 0 % (ref 0.0–0.2)

## 2023-04-27 LAB — URINALYSIS, ROUTINE W REFLEX MICROSCOPIC
Bacteria, UA: NONE SEEN
Bilirubin Urine: NEGATIVE
Glucose, UA: 50 mg/dL — AB
Ketones, ur: NEGATIVE mg/dL
Nitrite: NEGATIVE
Protein, ur: 100 mg/dL — AB
RBC / HPF: >50 RBC/hpf (ref 0–5)
Specific Gravity, Urine: 1.027 (ref 1.005–1.030)
WBC, UA: >50 WBC/hpf (ref 0–5)
pH: 5 (ref 5.0–8.0)

## 2023-04-27 LAB — BASIC METABOLIC PANEL
Anion gap: 7 (ref 5–15)
BUN: 18 mg/dL (ref 8–23)
CO2: 25 mmol/L (ref 22–32)
Calcium: 8.1 mg/dL — ABNORMAL LOW (ref 8.9–10.3)
Chloride: 106 mmol/L (ref 98–111)
Creatinine, Ser: 0.52 mg/dL (ref 0.44–1.00)
GFR, Estimated: >60 mL/min (ref >60)
Glucose, Bld: 137 mg/dL — ABNORMAL HIGH (ref 70–99)
Potassium: 4.1 mmol/L (ref 3.5–5.1)
Sodium: 138 mmol/L (ref 135–145)

## 2023-04-27 LAB — MAGNESIUM: Magnesium: 2.1 mg/dL (ref 1.7–2.4)

## 2023-04-27 MED ORDER — HYDROCODONE-ACETAMINOPHEN 7.5-325 MG PO TABS
1.0000 | ORAL_TABLET | ORAL | 0 refills | Status: DC | PRN
Start: 2023-04-27 — End: 2023-05-09

## 2023-04-27 MED ORDER — ENSURE ENLIVE PO LIQD: 237.0000 mL | Freq: Two times a day (BID) | ORAL | 12 refills | Status: DC

## 2023-04-27 MED ORDER — BISACODYL 5 MG PO TBEC: 5.0000 mg | DELAYED_RELEASE_TABLET | Freq: Every day | ORAL | 0 refills | Status: DC | PRN

## 2023-04-27 MED ORDER — HYDROCODONE-ACETAMINOPHEN 5-325 MG PO TABS
1.0000 | ORAL_TABLET | ORAL | 0 refills | Status: DC | PRN
Start: 2023-04-27 — End: 2023-06-02

## 2023-04-27 MED ORDER — ONDANSETRON HCL 4 MG PO TABS: 4.0000 mg | ORAL_TABLET | Freq: Four times a day (QID) | ORAL | 0 refills | Status: AC | PRN

## 2023-04-27 MED ORDER — DOCUSATE SODIUM 100 MG PO CAPS
100.0000 mg | ORAL_CAPSULE | Freq: Two times a day (BID) | ORAL | 0 refills | Status: DC
Start: 2023-04-27 — End: 2023-06-02

## 2023-04-27 MED ORDER — ENOXAPARIN SODIUM 30 MG/0.3ML IJ SOSY: 30.0000 mg | PREFILLED_SYRINGE | INTRAMUSCULAR | 0 refills | Status: DC

## 2023-04-27 MED ORDER — METHOCARBAMOL 500 MG PO TABS: 500.0000 mg | ORAL_TABLET | Freq: Four times a day (QID) | ORAL | 0 refills | Status: DC | PRN

## 2023-04-27 NOTE — Progress Notes (Signed)
 Pt has only urinated 50 cc some urine found on pur wick after changing this morning with bath. MD informed. Bladder scanned but was unable to get it to read. In and out performed per MD orders drained about 25 cc from bladder. UA obtained and sent to lab.

## 2023-04-27 NOTE — Progress Notes (Signed)
 PROGRESS NOTE    [name]  ZOX:[id number] DOB: [birth year] DOA: 04/22/2023 PCP: Sharee Holster, NP   Brief Narrative:    [name] is a 89 y.o. female with medical history significant for dementia, hypertension, hyperthyroidism, respiratory failure on 2 L. Patient was brought to the ED from [facility] reports of hip fracture.  Patient underwent IM nail placement to left hip fracture on 3/19 and and also had right femur fracture repair 3/21.  Assessment & Plan:   Principal Problem:   Closed pertrochanteric fracture of proximal end of left femur (HCC) Active Problems:   HTN (hypertension)   Goiter   Dementia (HCC)   Chronic respiratory failure with hypoxia (HCC)   Closed subtrochanteric fracture of hip, left, initial encounter (HCC)   Closed displaced spiral fracture of shaft of right femur (HCC)  Assessment and Plan:    Closed left hip fracture (HCC) X-ray shows proximal left femoral shaft fracture with varus angulation and displacement.  Patient is nonambulatory at baseline.  Has advanced dementia.  Also with bruising to left hand -Dr. Romeo Apple planning for IM nail today. -IV morphine 2 mg every 4 hourly as needed -X-ray of left hand without any acute findings -mildly tachycardic with dry mucous membranes, LR 500 bolus, continue , 75cc/hr x 20hrs  New right femur fracture -Status post fixation 3/21 -Undergoing PT, plan for discharge back to SNF  Postoperative anemia -Continue to monitor CBC with no overt bleeding noted -Received 1 unit PRBC transfusion 3/21 -Continue to monitor hemoglobin levels in a.m. per orthopedic recommendations   Chronic respiratory failure with hypoxia (HCC) On 2 L chronically.  Chest x-ray negative for acute abnormality.   Dementia (HCC) Advanced vascular dementia, nursing home resident, needs assistance for all ADLs.  Not ambulatory.   Goiter Recent TSH 03/25/2023 -2.9.  T3 and T4 within normal limits. - Resume methimazole     HTN (hypertension) Stable.  Not on antihypertensives.    DVT prophylaxis: SCDs Code Status: DNR Family Communication: Son at bedside 3/20 Disposition Plan:  Status is: Inpatient Remains inpatient appropriate because: Need for inpatient procedure and IV medications.   Consultants:  Orthopedics  Procedures:  Left IM nail 3/19 Right IM nail 3/21  Antimicrobials:  Anti-infectives (From admission, onward)    Start     Dose/Rate Route Frequency Ordered Stop   04/26/23 0036  ceFAZolin (ANCEF) IVPB 2g/100 mL premix        2 g 200 mL/hr over 30 Minutes Intravenous Every 6 hours 04/26/23 0036 04/26/23 0700   04/25/23 1145  ceFAZolin (ANCEF) IVPB 2g/100 mL premix        2 g 200 mL/hr over 30 Minutes Intravenous On call to O.R. 04/25/23 1104 04/25/23 1409   04/25/23 1145  ceFAZolin (ANCEF) IVPB 2g/100 mL premix  Status:  Discontinued        2 g 200 mL/hr over 30 Minutes Intravenous On call to O.R. 04/24/23 0901 04/25/23 1107   04/24/23 0600  ceFAZolin (ANCEF) IVPB 2g/100 mL premix        2 g 200 mL/hr over 30 Minutes Intravenous On call to O.R. 04/23/23 1419 04/23/23 1535   04/24/23 0031  ceFAZolin (ANCEF) IVPB 2g/100 mL premix        2 g 200 mL/hr over 30 Minutes Intravenous Every 6 hours 04/24/23 0031 04/24/23 1329   04/23/23 1425  ceFAZolin (ANCEF) 2-4 GM/100ML-% IVPB       Note to Pharmacy: Trenton Gammon S: cabinet override  04/23/23 1425 04/23/23 1536       Subjective: Patient seen and evaluated today and is currently somnolent.  No acute overnight events noted.  Objective: Vitals:   04/25/23 2314 04/26/23 0348 04/26/23 1936 04/27/23 0523  BP: 105/60 (!) 125/48 139/71 (!) 155/82  Pulse: (!) 115 96 97 80  Resp: 20 19 20 16   Temp: (!) 97.5 F (36.4 C) 97.7 F (36.5 C) 98.4 F (36.9 C) 98.9 F (37.2 C)  TempSrc: Axillary Axillary Axillary Oral  SpO2: 95% 95% 94% 95%  Weight:      Height:        Intake/Output Summary (Last 24 hours) at 04/27/2023  1038 Last data filed at 04/27/2023 0538 Gross per 24 hour  Intake 748.81 ml  Output 100 ml  Net 648.81 ml   Filed Weights   04/22/23 1445 04/22/23 1853 04/25/23 1137  Weight: 48 kg 48.1 kg 48.1 kg    Examination:  General exam: Appears calm and comfortable, elderly/frail Respiratory system: Clear to auscultation. Respiratory effort normal.  Nasal cannula. Cardiovascular system: S1 & S2 heard, RRR.  Gastrointestinal system: Abdomen is soft Central nervous system: Somnolent Extremities: No edema Skin: No significant lesions noted Psychiatry: Flat affect.    Data Reviewed: I have personally reviewed following labs and imaging studies  CBC: Recent Labs  Lab 04/22/23 1440 04/24/23 0429 04/25/23 0414 04/26/23 0341 04/27/23 0338  WBC 10.8* 8.0 9.5 8.9 9.7  HGB 10.9* 8.9* 8.0* 8.8* 8.6*  HCT 34.4* 29.6* 26.0* 28.6* 27.8*  MCV 92.5 96.1 95.2 92.9 94.6  PLT 366 290 292 267 240   Basic Metabolic Panel: Recent Labs  Lab 04/22/23 1440 04/24/23 0429 04/25/23 0414 04/26/23 0341 04/27/23 0338  NA 141 141 140 141 138  K 3.9 4.9 4.2 3.9 4.1  CL 105 105 106 108 106  CO2 25 26 27 25 25   GLUCOSE 212* 203* 143* 151* 137*  BUN 27* 21 35* 22 18  CREATININE 0.72 0.73 0.77 0.56 0.52  CALCIUM 8.9 8.5* 7.6* 8.5* 8.1*  MG  --  2.0 2.2 2.0 2.1   GFR: Estimated Creatinine Clearance: 29.8 mL/min (by C-G formula based on SCr of 0.52 mg/dL). Liver Function Tests: No results for input(s): "AST", "ALT", "ALKPHOS", "BILITOT", "PROT", "ALBUMIN" in the last 168 hours. No results for input(s): "LIPASE", "AMYLASE" in the last 168 hours. No results for input(s): "AMMONIA" in the last 168 hours. Coagulation Profile: No results for input(s): "INR", "PROTIME" in the last 168 hours. Cardiac Enzymes: No results for input(s): "CKTOTAL", "CKMB", "CKMBINDEX", "TROPONINI" in the last 168 hours. BNP (last 3 results) No results for input(s): "PROBNP" in the last 8760 hours. HbA1C: No results for  input(s): "HGBA1C" in the last 72 hours. CBG: Recent Labs  Lab 04/22/23 2144  GLUCAP 186*   Lipid Profile: No results for input(s): "CHOL", "HDL", "LDLCALC", "TRIG", "CHOLHDL", "LDLDIRECT" in the last 72 hours. Thyroid Function Tests: No results for input(s): "TSH", "T4TOTAL", "FREET4", "T3FREE", "THYROIDAB" in the last 72 hours. Anemia Panel: No results for input(s): "VITAMINB12", "FOLATE", "FERRITIN", "TIBC", "IRON", "RETICCTPCT" in the last 72 hours. Sepsis Labs: No results for input(s): "PROCALCITON", "LATICACIDVEN" in the last 168 hours.  Recent Results (from the past 240 hours)  Surgical PCR screen     Status: Abnormal   Collection Time: 04/25/23  1:55 AM   Specimen: Nasal Mucosa; Nasal Swab  Result Value Ref Range Status   MRSA, PCR POSITIVE (A) NEGATIVE Final    Comment: RESULT CALLED TO, READ BACK BY  AND VERIFIED WITH: Alfonso Patten [phone], VIRAY,J    Staphylococcus aureus POSITIVE (A) NEGATIVE Final    Comment: RESULT CALLED TO, READ BACK BY AND VERIFIED WITH: Alfonso Patten [phone], VIRAY,J (NOTE) The Xpert SA Assay (FDA approved for NASAL specimens in patients 42 years of age and older), is one component of a comprehensive surveillance program. It is not intended to diagnose infection nor to guide or monitor treatment. Performed at [facility], [address] [place], [place] [address]          Radiology Studies: DG FEMUR, [place] 2 VIEWS RIGHT Result Date: 04/25/2023 CLINICAL DATA:  Femur fracture, postop. EXAM: RIGHT FEMUR 2 VIEWS COMPARISON:  Preoperative radiograph. FINDINGS: Femoral intramedullary nail with distal and locking screw fixation traverse femoral shaft fracture. Previous surgical hardware in the proximal femur. Recent postsurgical change includes air and edema in the soft tissues. IMPRESSION: ORIF of femoral shaft fracture without immediate postoperative complication. Electronically Signed   By: Narda Rutherford M.D.   On: 04/25/2023 19:08    DG HIP UNILAT WITH PELVIS 2-3 VIEWS RIGHT Result Date: 04/25/2023 CLINICAL DATA:  Intertrochanteric fracture right femur. Intramedullary nail. EXAM: DG HIP (WITH OR WITHOUT PELVIS) 2-3V RIGHT COMPARISON:  Right femur radiographs 04/23/2023 FINDINGS: Images were performed intraoperatively without the presence of a radiologist. The patient is undergoing long intramedullary nail fixation of the right femur, spanning the previously seen oblique fracture of the distal femoral diaphysis. There are 3 distal interlocking screws. More remote proximal femoral dynamic hip screw also noted on prior radiographs, partially visualized on the current fluoroscopy images. Total fluoroscopy images: 12 Total fluoroscopy time: 346 seconds of fluoroscopy Total dose: Radiation Exposure Index (as provided by the fluoroscopic device): 31.2 mGy air Kerma Please see intraoperative findings for further detail. IMPRESSION: Intraoperative fluoroscopy for long intramedullary nail fixation of the right femur. Electronically Signed   By: Neita Garnet M.D.   On: 04/25/2023 16:41   DG C-Arm 1-60 Min-No Report Result Date: 04/25/2023 Fluoroscopy was utilized by the requesting physician.  No radiographic interpretation.   DG C-Arm 1-60 Min-No Report Result Date: 04/25/2023 Fluoroscopy was utilized by the requesting physician.  No radiographic interpretation.        Scheduled Meds:  sodium chloride   Intravenous Once   celecoxib  200 mg Oral BID   Chlorhexidine Gluconate Cloth  6 each Topical Q0600   Chlorhexidine Gluconate Cloth  6 each Topical Daily   docusate sodium  100 mg Oral BID   enoxaparin (LOVENOX) injection  30 mg Subcutaneous Q24H   feeding supplement  237 mL Oral BID BM   methimazole  5 mg Oral Daily   mupirocin ointment  1 Application Nasal BID   sodium chloride flush  3-10 mL Intravenous Q12H   traMADol  50 mg Oral Q6H      LOS: 5 days    Time spent: 35 minutes    Jennalynn Rivard Hoover Brunette, DO [facility]  If 7PM-7AM, please contact night-coverage [email or web address] 04/27/2023, 10:38 AM

## 2023-04-27 NOTE — Progress Notes (Addendum)
 Subjective: 2 Days Post-Op Procedure(s) (LRB): INSERTION, INTRAMEDULLARY ROD, FEMUR, RETROGRADE (Right) REMOVAL, HARDWARE, FEMUR (Right)  4 days postop cephalic medullary nail left peritrochanteric fracture   Patient reports pain as  // can not assess, but nurses say she doesn't c/o pain when turened .    Objective: Vital signs in last 24 hours: Temp:  [98.4 F (36.9 C)-98.9 F (37.2 C)] 98.9 F (37.2 C) (03/23 0523) Pulse Rate:  [80-97] 80 (03/23 0523) Resp:  [16-20] 16 (03/23 0523) BP: (139-155)/(71-82) 155/82 (03/23 0523) SpO2:  [94 %-95 %] 95 % (03/23 0523)  Intake/Output from previous day: 03/22 0701 - 03/23 0700 In: 1228.8 [P.O.:840; I.V.:388.8] Out: 100 [Urine:100] Intake/Output this shift: No intake/output data recorded.  Recent Labs    04/25/23 0414 04/26/23 0341 04/27/23 0338  HGB 8.0* 8.8* 8.6*   Recent Labs    04/26/23 0341 04/27/23 0338  WBC 8.9 9.7  RBC 3.08* 2.94*  HCT 28.6* 27.8*  PLT 267 240   Recent Labs    04/26/23 0341 04/27/23 0338  NA 141 138  K 3.9 4.1  CL 108 106  CO2 25 25  BUN 22 18  CREATININE 0.56 0.52  GLUCOSE 151* 137*  CALCIUM 8.5* 8.1*   No results for input(s): "LABPT", "INR" in the last 72 hours.  Neurologically intact Neurovascular intact Sensation intact distally Intact pulses distally Incision: no drainage Right lower extremity dressings are dry limb alignment is normal and consistent with immediate postop appearance  Left lower extremity dressings dry internal rotation of the extremity accepted.  Goal of surgery to relieve pain establish bone continuity prepare a stable construct for bed to chair has been accomplished.  The internal rotation of the limb will be accepted as the fracture was comminuted and difficult to assess limb rotation.  External rotation caused fracture gapping and was unacceptable.    Assessment/Plan: 2 Days Post-Op Procedure(s) (LRB): INSERTION, INTRAMEDULLARY ROD, FEMUR, RETROGRADE  (Right) REMOVAL, HARDWARE, FEMUR (Right) Plan for discharge tomorrow Discharge to SNF Hemoglobin remained stable    Fuller Canada 04/27/2023, 9:19 AM

## 2023-04-28 ENCOUNTER — Encounter (HOSPITAL_COMMUNITY): Payer: Self-pay | Admitting: Orthopedic Surgery

## 2023-04-28 ENCOUNTER — Telehealth: Payer: Self-pay | Admitting: Radiology

## 2023-04-28 DIAGNOSIS — R279 Unspecified lack of coordination: Secondary | ICD-10-CM | POA: Diagnosis not present

## 2023-04-28 DIAGNOSIS — Z741 Need for assistance with personal care: Secondary | ICD-10-CM | POA: Diagnosis not present

## 2023-04-28 DIAGNOSIS — S72341G Displaced spiral fracture of shaft of right femur, subsequent encounter for closed fracture with delayed healing: Secondary | ICD-10-CM | POA: Diagnosis not present

## 2023-04-28 DIAGNOSIS — Z8781 Personal history of (healed) traumatic fracture: Secondary | ICD-10-CM | POA: Diagnosis not present

## 2023-04-28 DIAGNOSIS — S72102A Unspecified trochanteric fracture of left femur, initial encounter for closed fracture: Secondary | ICD-10-CM | POA: Diagnosis not present

## 2023-04-28 DIAGNOSIS — Z4789 Encounter for other orthopedic aftercare: Secondary | ICD-10-CM | POA: Diagnosis not present

## 2023-04-28 DIAGNOSIS — S72341D Displaced spiral fracture of shaft of right femur, subsequent encounter for closed fracture with routine healing: Secondary | ICD-10-CM | POA: Diagnosis not present

## 2023-04-28 DIAGNOSIS — Z23 Encounter for immunization: Secondary | ICD-10-CM | POA: Diagnosis not present

## 2023-04-28 DIAGNOSIS — R1310 Dysphagia, unspecified: Secondary | ICD-10-CM | POA: Diagnosis not present

## 2023-04-28 DIAGNOSIS — F0390 Unspecified dementia without behavioral disturbance: Secondary | ICD-10-CM | POA: Diagnosis not present

## 2023-04-28 DIAGNOSIS — M8589 Other specified disorders of bone density and structure, multiple sites: Secondary | ICD-10-CM | POA: Diagnosis not present

## 2023-04-28 DIAGNOSIS — S72102S Unspecified trochanteric fracture of left femur, sequela: Secondary | ICD-10-CM | POA: Diagnosis not present

## 2023-04-28 DIAGNOSIS — S72351D Displaced comminuted fracture of shaft of right femur, subsequent encounter for closed fracture with routine healing: Secondary | ICD-10-CM | POA: Diagnosis not present

## 2023-04-28 DIAGNOSIS — M810 Age-related osteoporosis without current pathological fracture: Secondary | ICD-10-CM | POA: Diagnosis not present

## 2023-04-28 DIAGNOSIS — S7222XG Displaced subtrochanteric fracture of left femur, subsequent encounter for closed fracture with delayed healing: Secondary | ICD-10-CM | POA: Diagnosis not present

## 2023-04-28 DIAGNOSIS — I10 Essential (primary) hypertension: Secondary | ICD-10-CM | POA: Diagnosis not present

## 2023-04-28 DIAGNOSIS — R488 Other symbolic dysfunctions: Secondary | ICD-10-CM | POA: Diagnosis not present

## 2023-04-28 DIAGNOSIS — K219 Gastro-esophageal reflux disease without esophagitis: Secondary | ICD-10-CM | POA: Diagnosis not present

## 2023-04-28 DIAGNOSIS — F01C0 Vascular dementia, severe, without behavioral disturbance, psychotic disturbance, mood disturbance, and anxiety: Secondary | ICD-10-CM | POA: Diagnosis not present

## 2023-04-28 DIAGNOSIS — Z9981 Dependence on supplemental oxygen: Secondary | ICD-10-CM | POA: Diagnosis not present

## 2023-04-28 DIAGNOSIS — M19042 Primary osteoarthritis, left hand: Secondary | ICD-10-CM | POA: Diagnosis not present

## 2023-04-28 DIAGNOSIS — D62 Acute posthemorrhagic anemia: Secondary | ICD-10-CM | POA: Diagnosis not present

## 2023-04-28 DIAGNOSIS — S72142A Displaced intertrochanteric fracture of left femur, initial encounter for closed fracture: Secondary | ICD-10-CM | POA: Diagnosis not present

## 2023-04-28 DIAGNOSIS — M19032 Primary osteoarthritis, left wrist: Secondary | ICD-10-CM | POA: Diagnosis not present

## 2023-04-28 DIAGNOSIS — F411 Generalized anxiety disorder: Secondary | ICD-10-CM | POA: Diagnosis not present

## 2023-04-28 DIAGNOSIS — E559 Vitamin D deficiency, unspecified: Secondary | ICD-10-CM | POA: Diagnosis not present

## 2023-04-28 DIAGNOSIS — J9611 Chronic respiratory failure with hypoxia: Secondary | ICD-10-CM | POA: Diagnosis not present

## 2023-04-28 DIAGNOSIS — S72102D Unspecified trochanteric fracture of left femur, subsequent encounter for closed fracture with routine healing: Secondary | ICD-10-CM | POA: Diagnosis not present

## 2023-04-28 DIAGNOSIS — M6281 Muscle weakness (generalized): Secondary | ICD-10-CM | POA: Diagnosis not present

## 2023-04-28 DIAGNOSIS — G9341 Metabolic encephalopathy: Secondary | ICD-10-CM | POA: Diagnosis not present

## 2023-04-28 LAB — BPAM RBC
Blood Product Expiration Date: 202504062359
Blood Product Expiration Date: 202504082359
ISSUE DATE / TIME: 202503210820
Unit Type and Rh: 6200
Unit Type and Rh: 6200

## 2023-04-28 LAB — TYPE AND SCREEN
ABO/RH(D): AB POS
Antibody Screen: NEGATIVE
Unit division: 0
Unit division: 0

## 2023-04-28 LAB — CBC
HCT: 26.2 % — ABNORMAL LOW (ref 36.0–46.0)
Hemoglobin: 8.3 g/dL — ABNORMAL LOW (ref 12.0–15.0)
MCH: 29.7 pg (ref 26.0–34.0)
MCHC: 31.7 g/dL (ref 30.0–36.0)
MCV: 93.9 fL (ref 80.0–100.0)
Platelets: 268 10*3/uL (ref 150–400)
RBC: 2.79 MIL/uL — ABNORMAL LOW (ref 3.87–5.11)
RDW: 14.3 % (ref 11.5–15.5)
WBC: 7.9 10*3/uL (ref 4.0–10.5)
nRBC: 0 % (ref 0.0–0.2)

## 2023-04-28 MED ORDER — SMOG ENEMA
960.0000 mL | Freq: Once | RECTAL | Status: AC
  Administered 2023-04-28: 960 mL via RECTAL
  Filled 2023-04-28: qty 960

## 2023-04-28 MED ORDER — FOSFOMYCIN TROMETHAMINE 3 G PO PACK
3.0000 g | PACK | Freq: Once | ORAL | Status: AC
Start: 2023-04-28 — End: 2023-04-28
  Administered 2023-04-28: 3 g via ORAL
  Filled 2023-04-28: qty 3

## 2023-04-28 MED ORDER — POLYETHYLENE GLYCOL 3350 17 G PO PACK: 17.0000 g | PACK | Freq: Two times a day (BID) | ORAL | 0 refills | Status: DC

## 2023-04-28 NOTE — Care Management Important Message (Signed)
 Important Message  Patient Details  Name: [name] MRN: [id number] Date of Birth: Dec 29, 1928   Important Message Given:  Yes - Medicare IM     Corey Harold 04/28/2023, 12:17 PM

## 2023-04-28 NOTE — Plan of Care (Signed)
 Rested well during the night. Turned and repositioned in bed. Incontinent of large amount of urine during the shift.  Problem: Education: Goal: Knowledge of General Education information will improve Description: Including pain rating scale, medication(s)/side effects and non-pharmacologic comfort measures Outcome: Progressing   Problem: Health Behavior/Discharge Planning: Goal: Ability to manage health-related needs will improve Outcome: Progressing   Problem: Clinical Measurements: Goal: Ability to maintain clinical measurements within normal limits will improve Outcome: Progressing Goal: Will remain free from infection Outcome: Progressing Goal: Diagnostic test results will improve Outcome: Progressing Goal: Respiratory complications will improve Outcome: Progressing Goal: Cardiovascular complication will be avoided Outcome: Progressing   Problem: Activity: Goal: Risk for activity intolerance will decrease Outcome: Progressing   Problem: Coping: Goal: Level of anxiety will decrease Outcome: Progressing   Problem: Elimination: Goal: Will not experience complications related to urinary retention Outcome: Progressing   Problem: Pain Managment: Goal: General experience of comfort will improve and/or be controlled Outcome: Progressing   Problem: Safety: Goal: Ability to remain free from injury will improve Outcome: Progressing   Problem: Skin Integrity: Goal: Risk for impaired skin integrity will decrease Outcome: Progressing   Problem: Education: Goal: Verbalization of understanding the information provided (i.e., activity precautions, restrictions, etc) will improve Outcome: Progressing Goal: Individualized Educational Video(s) Outcome: Progressing   Problem: Activity: Goal: Ability to ambulate and perform ADLs will improve Outcome: Progressing   Problem: Clinical Measurements: Goal: Postoperative complications will be avoided or minimized Outcome:  Progressing   Problem: Self-Concept: Goal: Ability to maintain and perform role responsibilities to the fullest extent possible will improve Outcome: Progressing   Problem: Pain Management: Goal: Pain level will decrease Outcome: Progressing   Problem: Nutrition: Goal: Adequate nutrition will be maintained Outcome: Not Progressing   Problem: Elimination: Goal: Will not experience complications related to bowel motility Outcome: Not Progressing

## 2023-04-28 NOTE — Progress Notes (Signed)
 Pt discharged via bed to [facility]. Daughter and granddaughter at bedside, aware of discharge. Glasses sent with pt.

## 2023-04-28 NOTE — Progress Notes (Signed)
 Staff in to admin enema, pt noted to have already passed brown liquid stool and one large hard ball of stool. Pt turned, cleaned and digital rectal exam completed. Pt noted to have multiple med and small hard balls of stool still in rectum. Stool manually disimpacted and pt began to pass more liquid stool. Administered 150 ml of SMOG enema but pt unable to retain. Pt cleaned, new linens applied. Pt tolerated movement and procedure well. MD notified of above results.

## 2023-04-28 NOTE — Discharge Summary (Addendum)
 Physician Discharge Summary  [name] ZOX:[id number] DOB: August 24, 1928 DOA: 04/22/2023  PCP: Sharee Holster, NP  Admit date: 04/22/2023  Discharge date: 04/28/2023  Admitted From: SNF  Disposition: SNF  Recommendations for Outpatient Follow-up:  Follow up with PCP in 1-2 weeks Follow-up with orthopedics in 2 weeks Continue on Lovenox for DVT prophylaxis Pain medications as needed Continue other medications as noted below with aggressive bowel regimen given fecal impaction/constipation while hospitalized Treatment for UTI completed with 1 dose of fosfomycin  Home Health: None  Equipment/Devices: None  Discharge Condition:Stable  CODE STATUS: DNR  Diet recommendation: Heart Healthy  Brief/Interim Summary: [name] is a 89 y.o. female with medical history significant for dementia, hypertension, hyperthyroidism, respiratory failure on 2 L. Patient was brought to the ED from [facility] reports of hip fracture.  Patient underwent IM nail placement to left hip fracture on 3/19 and and also had right femur fracture repair 3/21 after fracture took place during positioning in the OR.  She was noted to have some postoperative anemia after the initial surgery requiring 1 unit PRBC transfusion and hemoglobin levels have remained stable.  In the interim, she has also had a UTI which was treated empirically with fosfomycin and she was noted to have some fecal impaction due to no bowel movement noted throughout the course of the stay which resolved with manual disimpaction as well as enema prior to discharge.  She will continue to remain on aggressive bowel regimen outpatient in addition to her pain medications and follow-up with orthopedics as recommended.  Discharge Diagnoses:  Principal Problem:   Closed pertrochanteric fracture of proximal end of left femur (HCC) Active Problems:   HTN (hypertension)   Goiter   Dementia (HCC)   Chronic respiratory failure with hypoxia (HCC)    Closed subtrochanteric fracture of hip, left, initial encounter (HCC)   Closed displaced spiral fracture of shaft of right femur (HCC)  Principal discharge diagnosis: Closed left hip fracture along with new right femur fracture status post fixation bilaterally with complication of postoperative anemia status post 1 unit PRBC transfusion.  UTI.  Fecal impaction.  Discharge Instructions  Discharge Instructions     Diet - low sodium heart healthy   Complete by: As directed    Diet - low sodium heart healthy   Complete by: As directed    Increase activity slowly   Complete by: As directed    Increase activity slowly   Complete by: As directed    Leave dressing on - Keep it clean, dry, and intact until clinic visit   Complete by: As directed    Leave dressing on - Keep it clean, dry, and intact until clinic visit   Complete by: As directed       Allergies as of 04/28/2023       Reactions   Hydrocodone-acetaminophen Nausea And Vomiting        Medication List     STOP taking these medications    sennosides-docusate sodium 8.6-50 MG tablet Commonly known as: SENOKOT-S       TAKE these medications    acetaminophen 325 MG tablet Commonly known as: TYLENOL Take 650 mg by mouth 2 (two) times daily. 10 am and 10 pm   bisacodyl 5 MG EC tablet Commonly known as: DULCOLAX Take 1 tablet (5 mg total) by mouth daily as needed for moderate constipation.   docusate sodium 100 MG capsule Commonly known as: COLACE Take 1 capsule (100 mg total) by mouth  2 (two) times daily.   enoxaparin 30 MG/0.3ML injection Commonly known as: LOVENOX Inject 0.3 mLs (30 mg total) into the skin daily.   feeding supplement Liqd Take 237 mLs by mouth 2 (two) times daily between meals. What changed:  how much to take when to take this   HYDROcodone-acetaminophen 5-325 MG tablet Commonly known as: NORCO/VICODIN Take 1-2 tablets by mouth every 4 (four) hours as needed for moderate pain (pain  score 4-6).   HYDROcodone-acetaminophen 7.5-325 MG tablet Commonly known as: NORCO Take 1-2 tablets by mouth every 4 (four) hours as needed for severe pain (pain score 7-10).   methimazole 5 MG tablet Commonly known as: TAPAZOLE Take 5 mg by mouth daily.   methocarbamol 500 MG tablet Commonly known as: ROBAXIN Take 1 tablet (500 mg total) by mouth every 6 (six) hours as needed for muscle spasms.   mirtazapine 7.5 MG tablet Commonly known as: REMERON Take 7.5 mg by mouth at bedtime.   mupirocin ointment 2 % Commonly known as: BACTROBAN Place 1 Application into the nose 2 (two) times daily for 60 doses. Use as directed 2 times daily for 5 days every other week for 6 weeks.   NON FORMULARY Diet:Dysphagia 3 (mechanical soft), thin liquids. OK to have soft sandwiches per LPN request.   ondansetron 4 MG tablet Commonly known as: ZOFRAN Take 1 tablet (4 mg total) by mouth every 6 (six) hours as needed for nausea.   OXYGEN Inhale 2 L into the lungs continuous.   polyethylene glycol 17 g packet Commonly known as: MiraLax Take 17 g by mouth 2 (two) times daily.   Venelex Oint Apply 1 Application topically.               Discharge Care Instructions  (From admission, onward)           Start     Ordered   04/28/23 0000  Leave dressing on - Keep it clean, dry, and intact until clinic visit        04/28/23 1358   04/27/23 0000  Leave dressing on - Keep it clean, dry, and intact until clinic visit        04/27/23 0854            Follow-up Information     Sharee Holster, NP. Schedule an appointment as soon as possible for a visit in 1 month(s).   Specialty: Geriatric Medicine Contact information: [address] [place] [address] [phone]         Vickki Hearing, MD Follow up in 2 week(s).   Specialties: Orthopedic Surgery, Radiology Contact information: [address] [place] [address] [phone]                 Allergies  Allergen Reactions   Hydrocodone-Acetaminophen Nausea And Vomiting    Consultations: Orthopedics   Procedures/Studies: DG FEMUR, MIN 2 VIEWS RIGHT Result Date: 04/25/2023 CLINICAL DATA:  Femur fracture, postop. EXAM: RIGHT FEMUR 2 VIEWS COMPARISON:  Preoperative radiograph. FINDINGS: Femoral intramedullary nail with distal and locking screw fixation traverse femoral shaft fracture. Previous surgical hardware in the proximal femur. Recent postsurgical change includes air and edema in the soft tissues. IMPRESSION: ORIF of femoral shaft fracture without immediate postoperative complication. Electronically Signed   By: Narda Rutherford M.D.   On: 04/25/2023 19:08   DG HIP UNILAT WITH PELVIS 2-3 VIEWS RIGHT Result Date: 04/25/2023 CLINICAL DATA:  Intertrochanteric fracture right femur. Intramedullary nail. EXAM: DG HIP (WITH OR  WITHOUT PELVIS) 2-3V RIGHT COMPARISON:  Right femur radiographs 04/23/2023 FINDINGS: Images were performed intraoperatively without the presence of a radiologist. The patient is undergoing long intramedullary nail fixation of the right femur, spanning the previously seen oblique fracture of the distal femoral diaphysis. There are 3 distal interlocking screws. More remote proximal femoral dynamic hip screw also noted on prior radiographs, partially visualized on the current fluoroscopy images. Total fluoroscopy images: 12 Total fluoroscopy time: 346 seconds of fluoroscopy Total dose: Radiation Exposure Index (as provided by the fluoroscopic device): 31.2 mGy air Kerma Please see intraoperative findings for further detail. IMPRESSION: Intraoperative fluoroscopy for long intramedullary nail fixation of the right femur. Electronically Signed   By: Neita Garnet M.D.   On: 04/25/2023 16:41   DG C-Arm 1-60 Min-No Report Result Date: 04/25/2023 Fluoroscopy was utilized by the requesting physician.  No radiographic interpretation.   DG C-Arm 1-60 Min-No Report Result  Date: 04/25/2023 Fluoroscopy was utilized by the requesting physician.  No radiographic interpretation.   DG FEMUR MIN 2 VIEWS LEFT Result Date: 04/23/2023 CLINICAL DATA:  Postop femur fracture EXAM: LEFT FEMUR 2 VIEWS; RIGHT FEMUR 2 VIEWS COMPARISON:  Same day radiographs and fluoroscopic images FINDINGS: LEFT: Expected postoperative change of intramedullary rod and screw fixation across a proximal left femur fracture. Cutaneous staples and soft tissue gas about the left hip. No radiographic evidence of loosening. No additional fractures. RIGHT: Fixation of the proximal right femur. No radiographic evidence of loosening. Acute oblique mildly displaced fracture in the mid to distal right femur. There is apex anterior angulation. IMPRESSION: 1. Acute oblique mildly angulated and displaced fracture of the distal RIGHT femur. 2. Expected postoperative change from ORIF of the proximal LEFT femur fracture. Electronically Signed   By: Minerva Fester M.D.   On: 04/23/2023 20:43   DG FEMUR, MIN 2 VIEWS RIGHT Result Date: 04/23/2023 CLINICAL DATA:  Postop femur fracture EXAM: LEFT FEMUR 2 VIEWS; RIGHT FEMUR 2 VIEWS COMPARISON:  Same day radiographs and fluoroscopic images FINDINGS: LEFT: Expected postoperative change of intramedullary rod and screw fixation across a proximal left femur fracture. Cutaneous staples and soft tissue gas about the left hip. No radiographic evidence of loosening. No additional fractures. RIGHT: Fixation of the proximal right femur. No radiographic evidence of loosening. Acute oblique mildly displaced fracture in the mid to distal right femur. There is apex anterior angulation. IMPRESSION: 1. Acute oblique mildly angulated and displaced fracture of the distal RIGHT femur. 2. Expected postoperative change from ORIF of the proximal LEFT femur fracture. Electronically Signed   By: Minerva Fester M.D.   On: 04/23/2023 20:43   DG FEMUR MIN 2 VIEWS LEFT Result Date: 04/23/2023 CLINICAL DATA:   ORIF left hip fracture. EXAM: LEFT FEMUR 2 VIEWS FLUOROSCOPY TIME:  Fluoroscopy Time:  4 minutes 25 seconds Radiation Exposure Index (if provided by the fluoroscopic device): 45.95 mGy Number of Acquired Spot Images: 18 COMPARISON:  Radiographs 04/22/2023 FINDINGS: Multiple intraoperative fluoroscopic spot images are provided without a radiologist present during intramedullary rod and screw fixation across a proximal left femur fracture. IMPRESSION: Intraoperative fluoroscopic spot images during proximal left femur fracture fixation. Electronically Signed   By: Minerva Fester M.D.   On: 04/23/2023 20:39   DG C-Arm 1-60 Min-No Report Result Date: 04/23/2023 Fluoroscopy was utilized by the requesting physician.  No radiographic interpretation.   DG Hand 2 View Left Result Date: 04/22/2023 CLINICAL DATA:  Fall couple of days ago.  Left hand pain. EXAM: LEFT HAND - 2 VIEW  COMPARISON:  None Available. FINDINGS: The bones are subjectively under mineralized. Assessment of the digits is limited due to osseous overlap. No evidence of acute fracture. Moderate multifocal osteoarthritis involving the metacarpal phalangeal joints and throughout the digits. No focal soft tissue abnormalities seen. IMPRESSION: 1. No acute fracture of the left hand. 2. Moderate multifocal osteoarthritis. Electronically Signed   By: Narda Rutherford M.D.   On: 04/22/2023 21:17   DG Chest 1 View Result Date: 04/22/2023 CLINICAL DATA:  Fall today. EXAM: CHEST  1 VIEW COMPARISON:  February 11, 2019. FINDINGS: The heart size and mediastinal contours are within normal limits. Both lungs are clear. The visualized skeletal structures are unremarkable. IMPRESSION: No active disease. Electronically Signed   By: Lupita Raider M.D.   On: 04/22/2023 17:13   DG HIP UNILAT W OR W/O PELVIS 2-3 VIEWS LEFT Result Date: 04/22/2023 CLINICAL DATA:  Fall, left hip pain EXAM: DG HIP (WITH OR WITHOUT PELVIS) 2-3V LEFT COMPARISON:  07/18/2017 FINDINGS: There  is a left proximal femoral fracture which appears to be just below the intertrochanteric region in the proximal shaft. Varus angulation and displacement. No subluxation or dislocation. Remote posttraumatic and postsurgical changes in the right femur. Degenerative changes in the hips bilaterally, right greater than left. IMPRESSION: Proximal left femoral shaft fracture with varus angulation and displacement. Electronically Signed   By: Charlett Nose M.D.   On: 04/22/2023 17:13     Discharge Exam: Vitals:   04/28/23 0501 04/28/23 1449  BP: 121/60 (!) 100/44  Pulse: 91 62  Resp: 20 17  Temp: 98.3 F (36.8 C)   SpO2: 96% 98%   Vitals:   04/27/23 1110 04/27/23 2027 04/28/23 0501 04/28/23 1449  BP: (!) 148/73 (!) 117/59 121/60 (!) 100/44  Pulse: 85 91 91 62  Resp: 15 16 20 17   Temp: 97.9 F (36.6 C) 98.6 F (37 C) 98.3 F (36.8 C)   TempSrc:   Oral Oral  SpO2: 96% 95% 96% 98%  Weight:      Height:        General: Pt is alert, awake, not in acute distress Cardiovascular: RRR, S1/S2 +, no rubs, no gallops Respiratory: CTA bilaterally, no wheezing, no rhonchi Abdominal: Soft, NT, ND, bowel sounds + Extremities: no edema, no cyanosis    The results of significant diagnostics from this hospitalization (including imaging, microbiology, ancillary and laboratory) are listed below for reference.     Microbiology: Recent Results (from the past 240 hours)  Surgical PCR screen     Status: Abnormal   Collection Time: 04/25/23  1:55 AM   Specimen: Nasal Mucosa; Nasal Swab  Result Value Ref Range Status   MRSA, PCR POSITIVE (A) NEGATIVE Final    Comment: RESULT CALLED TO, READ BACK BY AND VERIFIED WITH: ALLI BAILEY [phone], VIRAY,J    Staphylococcus aureus POSITIVE (A) NEGATIVE Final    Comment: RESULT CALLED TO, READ BACK BY AND VERIFIED WITH: Alfonso Patten [phone], VIRAY,J (NOTE) The Xpert SA Assay (FDA approved for NASAL specimens in patients 58 years of age and older), is  one component of a comprehensive surveillance program. It is not intended to diagnose infection nor to guide or monitor treatment. Performed at [facility], [address] [place], [place] [address]      Labs: BNP (last 3 results) No results for input(s): "BNP" in the last 8760 hours. Basic Metabolic Panel: Recent Labs  Lab 04/22/23 1440 04/24/23 0429 04/25/23 0414 04/26/23 0341 04/27/23 0338  NA  141 141 140 141 138  K 3.9 4.9 4.2 3.9 4.1  CL 105 105 106 108 106  CO2 25 26 27 25 25   GLUCOSE 212* 203* 143* 151* 137*  BUN 27* 21 35* 22 18  CREATININE 0.72 0.73 0.77 0.56 0.52  CALCIUM 8.9 8.5* 7.6* 8.5* 8.1*  MG  --  2.0 2.2 2.0 2.1   Liver Function Tests: No results for input(s): "AST", "ALT", "ALKPHOS", "BILITOT", "PROT", "ALBUMIN" in the last 168 hours. No results for input(s): "LIPASE", "AMYLASE" in the last 168 hours. No results for input(s): "AMMONIA" in the last 168 hours. CBC: Recent Labs  Lab 04/24/23 0429 04/25/23 0414 04/26/23 0341 04/27/23 0338 04/28/23 0516  WBC 8.0 9.5 8.9 9.7 7.9  HGB 8.9* 8.0* 8.8* 8.6* 8.3*  HCT 29.6* 26.0* 28.6* 27.8* 26.2*  MCV 96.1 95.2 92.9 94.6 93.9  PLT 290 292 267 240 268   Cardiac Enzymes: No results for input(s): "CKTOTAL", "CKMB", "CKMBINDEX", "TROPONINI" in the last 168 hours. BNP: Invalid input(s): "POCBNP" CBG: Recent Labs  Lab 04/22/23 2144  GLUCAP 186*   D-Dimer No results for input(s): "DDIMER" in the last 72 hours. Hgb A1c No results for input(s): "HGBA1C" in the last 72 hours. Lipid Profile No results for input(s): "CHOL", "HDL", "LDLCALC", "TRIG", "CHOLHDL", "LDLDIRECT" in the last 72 hours. Thyroid function studies No results for input(s): "TSH", "T4TOTAL", "T3FREE", "THYROIDAB" in the last 72 hours.  Invalid input(s): "FREET3" Anemia work up No results for input(s): "VITAMINB12", "FOLATE", "FERRITIN", "TIBC", "IRON", "RETICCTPCT" in the last 72 hours. Urinalysis    Component Value Date/Time    COLORURINE AMBER (A) 04/27/2023 1415   APPEARANCEUR CLOUDY (A) 04/27/2023 1415   LABSPEC 1.027 04/27/2023 1415   PHURINE 5.0 04/27/2023 1415   GLUCOSEU 50 (A) 04/27/2023 1415   HGBUR LARGE (A) 04/27/2023 1415   BILIRUBINUR NEGATIVE 04/27/2023 1415   KETONESUR NEGATIVE 04/27/2023 1415   PROTEINUR 100 (A) 04/27/2023 1415   UROBILINOGEN 0.2 11/05/2014 2333   NITRITE NEGATIVE 04/27/2023 1415   LEUKOCYTESUR MODERATE (A) 04/27/2023 1415   Sepsis Labs Recent Labs  Lab 04/25/23 0414 04/26/23 0341 04/27/23 0338 04/28/23 0516  WBC 9.5 8.9 9.7 7.9   Microbiology Recent Results (from the past 240 hours)  Surgical PCR screen     Status: Abnormal   Collection Time: 04/25/23  1:55 AM   Specimen: Nasal Mucosa; Nasal Swab  Result Value Ref Range Status   MRSA, PCR POSITIVE (A) NEGATIVE Final    Comment: RESULT CALLED TO, READ BACK BY AND VERIFIED WITH: ALLI BAILEY [phone], VIRAY,J    Staphylococcus aureus POSITIVE (A) NEGATIVE Final    Comment: RESULT CALLED TO, READ BACK BY AND VERIFIED WITH: Alfonso Patten [phone], VIRAY,J (NOTE) The Xpert SA Assay (FDA approved for NASAL specimens in patients 38 years of age and older), is one component of a comprehensive surveillance program. It is not intended to diagnose infection nor to guide or monitor treatment. Performed at [facility], [address] [place], [place] [address]      Time coordinating discharge: 35 minutes  SIGNED:   Erick Blinks, DO [facility] 04/28/2023, 2:58 PM  If 7PM-7AM, please contact night-coverage [email or web address]

## 2023-04-28 NOTE — Telephone Encounter (Signed)
 Tammy called and LM about patient.  Needing to know the stop date for her lovenox.  Please call and advise.

## 2023-04-28 NOTE — TOC Transition Note (Signed)
 Transition of Care [facility]) - Discharge Note   Patient Details  Name: [name] MRN: [id number] Date of Birth: [birth year]  Transition of [facility]) CM/SW Contact:  Leitha Bleak, RN Phone Number: 04/28/2023, 3:02 PM   Clinical Narrative:   Patient discharging back to [facility], DC summary sent in the hub. RN calling report.     Final next level of care: Skilled Nursing Facility Barriers to Discharge: Barriers Resolved   Patient Goals and CMS Choice Patient states their goals for this hospitalization and ongoing recovery are:: return to SNF   Choice offered to / list presented to : Adult Children [place] ownership interest in [facility].provided to:: Adult Children    Discharge Placement                    Patient and family notified of of transfer: 04/28/23  Discharge Plan and Services Additional resources added to the After Visit Summary for   In-house Referral: Clinical Social Work   Post Acute Care Choice: Skilled Nursing Facility                               Social Drivers of Health (SDOH) Interventions SDOH Screenings   Food Insecurity: Patient Declined (04/23/2023)  Housing: Patient Declined (04/23/2023)  Transportation Needs: Patient Declined (04/23/2023)  Utilities: Patient Declined (04/23/2023)  Depression (PHQ2-9): Low Risk  (01/22/2023)  Social Connections: Patient Declined (04/23/2023)  Tobacco Use: Low Risk  (04/25/2023)     Readmission Risk Interventions     No data to display

## 2023-04-29 ENCOUNTER — Non-Acute Institutional Stay (SKILLED_NURSING_FACILITY): Payer: Self-pay | Admitting: Internal Medicine

## 2023-04-29 ENCOUNTER — Encounter: Payer: Self-pay | Admitting: Internal Medicine

## 2023-04-29 DIAGNOSIS — S72341G Displaced spiral fracture of shaft of right femur, subsequent encounter for closed fracture with delayed healing: Secondary | ICD-10-CM

## 2023-04-29 DIAGNOSIS — D62 Acute posthemorrhagic anemia: Secondary | ICD-10-CM | POA: Diagnosis not present

## 2023-04-29 DIAGNOSIS — S7222XG Displaced subtrochanteric fracture of left femur, subsequent encounter for closed fracture with delayed healing: Secondary | ICD-10-CM | POA: Diagnosis not present

## 2023-04-29 DIAGNOSIS — M810 Age-related osteoporosis without current pathological fracture: Secondary | ICD-10-CM

## 2023-04-29 NOTE — Assessment & Plan Note (Signed)
 Monitor for acute bleeding.

## 2023-04-29 NOTE — Patient Instructions (Signed)
 See assessment and plan under each diagnosis in the problem list and acutely for this visit

## 2023-04-29 NOTE — Progress Notes (Unsigned)
 NURSING HOME LOCATION:  [facility] ROOM NUMBER:  114 W  CODE STATUS:  DNR  PCP: Sharee Holster, NP   This is a nursing facility follow up visit for Nursing Facility readmission within 30 days.  Interim medical record and care since last SNF visit was updated with review of diagnostic studies and change in clinical status since last visit were documented.  HPI: She was rehospitalized 3/18 - 04/28/2023, transferred from [facility] for treatment of closed intertrochanteric fracture of the proximal end of the left femur.  She was being transferred out of bed Saturday 3/15 via [facility] lift; the patient swung her hands out resulting in a skin tear on the left forearm.  The morning of 3/18 she complained of pain in the left hip.  Exam revealed external rotation and shortening of the left lower extremity.  Portable imaging demonstrated the femur fracture.  This is in the context of severe osteoporosis with a T-score of -5.527. Nadir calcium of 7.6. She underwent LEFT intertrochanteric intramedullary nailing 04/23/2023.  On 3/21 RIGHT hard ware was removed and femur IM nailing completed for right femur fracture sustained during positioning in the OR.  She has a past history of ORIF right hip fracture on 11/20/2012. Postoperatively she required 1 unit of packed cells for acute blood loss anemia with H/H nadir of 8/26. Clinically UTI was suspected and she was treated empirically with fosfomycin.  No CNS performed.  Course was also complicated by fecal impaction requiring manual disimpaction as well as an enema prior to discharge.  Review of systems: Dementia invalidated responses.  Her voice is weak and speech was rambling and unintelligible.  She could provide no history and cannot follow commands. Her son and daughter-in-law were in the room and validated they are aware of the fragility fractures.  They did mention that her PCP had planned bone building therapy years ago; they are unsure  if he was ever pursued.  Physical exam:  Pertinent or positive findings: She appears her age and chronically ill and suboptimally nourished.  Hair is disheveled.  She is almost cachectic.  She is missing most of the maxillary teeth and there is some fractures or malalignment of the mandibular teeth.  Nasal oxygen is in place.  Large goiter is visible.  Heart rhythm is irregular but rate adequately controlled.  She has low-grade diffuse rhonchi.  I will pulses are surprisingly strong.  She has contractures of the toes.  The great toenails are deformed.  Dressings present over the heels.  General appearance: Adequately nourished; no acute distress, increased work of breathing is present.   Lymphatic: No lymphadenopathy about the head, neck, axilla. Eyes: No conjunctival inflammation or lid edema is present. There is no scleral icterus. Ears:  External ear exam shows no significant lesions or deformities.   Nose:  External nasal examination shows no deformity or inflammation. Nasal mucosa are pink and moist without lesions, exudates Oral exam:  Lips and gums are healthy appearing. There is no oropharyngeal erythema or exudate. Neck:  No thyromegaly, masses, tenderness noted.    Heart:  Normal rate and regular rhythm. S1 and S2 normal without gallop, murmur, click, rub .  Lungs: Chest clear to auscultation without wheezes, rhonchi, rales, rubs. Abdomen: Bowel sounds are normal. Abdomen is soft and nontender with no organomegaly, hernias, masses. GU: Deferred  Extremities:  No cyanosis, clubbing, edema  Neurologic exam : Cn 2-7 intact Strength equal  in upper & lower extremities Balance, Rhomberg,  finger to nose testing could not be completed due to clinical state Deep tendon reflexes are equal Skin: Warm & dry w/o tenting. No significant lesions or rash.  See summary under each active problem in the Problem List with associated updated therapeutic plan

## 2023-04-29 NOTE — Telephone Encounter (Signed)
 Left  confidential vm to tammy advising to stop meds 42 days

## 2023-04-29 NOTE — Assessment & Plan Note (Addendum)
 Vitamin D and calcium supplementations are reasonable.  Unfortunately she is not likely to live long enough to see osteoporosis therapy benefit. The severity of the osteoporosis was discussed with her son and daughter-in-law and they fully understand the conundrum.  They just wanted make sure that any transfer from bed to chair involves 2 people to minimize fragility fracture risk is much as possible

## 2023-05-06 DIAGNOSIS — Z23 Encounter for immunization: Secondary | ICD-10-CM | POA: Diagnosis not present

## 2023-05-07 NOTE — Progress Notes (Unsigned)
error 

## 2023-05-09 ENCOUNTER — Other Ambulatory Visit (INDEPENDENT_AMBULATORY_CARE_PROVIDER_SITE_OTHER): Payer: PRIVATE HEALTH INSURANCE

## 2023-05-09 ENCOUNTER — Non-Acute Institutional Stay (SKILLED_NURSING_FACILITY): Payer: Self-pay | Admitting: Adult Health

## 2023-05-09 ENCOUNTER — Ambulatory Visit (INDEPENDENT_AMBULATORY_CARE_PROVIDER_SITE_OTHER): Admitting: Orthopedic Surgery

## 2023-05-09 ENCOUNTER — Encounter: Payer: Self-pay | Admitting: Adult Health

## 2023-05-09 DIAGNOSIS — S72102S Unspecified trochanteric fracture of left femur, sequela: Secondary | ICD-10-CM | POA: Diagnosis not present

## 2023-05-09 DIAGNOSIS — S72351D Displaced comminuted fracture of shaft of right femur, subsequent encounter for closed fracture with routine healing: Secondary | ICD-10-CM

## 2023-05-09 DIAGNOSIS — S72142A Displaced intertrochanteric fracture of left femur, initial encounter for closed fracture: Secondary | ICD-10-CM | POA: Diagnosis not present

## 2023-05-09 DIAGNOSIS — S72001D Fracture of unspecified part of neck of right femur, subsequent encounter for closed fracture with routine healing: Secondary | ICD-10-CM

## 2023-05-09 DIAGNOSIS — S72341G Displaced spiral fracture of shaft of right femur, subsequent encounter for closed fracture with delayed healing: Secondary | ICD-10-CM | POA: Diagnosis not present

## 2023-05-09 DIAGNOSIS — F01C0 Vascular dementia, severe, without behavioral disturbance, psychotic disturbance, mood disturbance, and anxiety: Secondary | ICD-10-CM

## 2023-05-09 NOTE — Progress Notes (Signed)
 Location:  [facility] Room Number: 110 Place of Service:  SNF (31)   CODE STATUS: dnr  Allergies  Allergen Reactions   Hydrocodone-Acetaminophen Nausea And Vomiting    Chief Complaint  Patient presents with   Acute Visit    Care plan meeting     HPI:  We have come together for her care plan meeting. BIMS no mood 0/30. Has had one falls; has bilateral hip fractures. She is dependent assist with her adl care. She is incontinent of bladder and bowel. Dietary: D1 with thin liquids poor appetite; is losing weight; is taking supplements.Therapy:  She is on dependent with mobility; transfers max assist with upper body dependent for lower body. She will continue to be followed for her chronic illnesses including: Closed pertrochanteric of left femur, sequela  Closed displaced spiral fracture of shaft of right femur with delayed healing subsequent encounter   Severe vascular dementia without behavioral disturbance; psychotic disturbance; mood disturbance or anxiety  Past Medical History:  Diagnosis Date   Anxiety    Dementia (HCC)    GERD (gastroesophageal reflux disease)    Goiter    Hypertension    Major depression, recurrent, chronic (HCC) 02/22/2019   Osteoarthritis    Osteoporosis    Vitamin D insufficiency     Past Surgical History:  Procedure Laterality Date   APPENDECTOMY     FEMUR IM NAIL Right 04/25/2023   Procedure: INSERTION, INTRAMEDULLARY ROD, FEMUR, RETROGRADE;  Surgeon: Vickki Hearing, MD;  Location: [facility];  Service: Orthopedics;  Laterality: Right;  arhtrx nail retrograde femoral   smith nephew dhs removal may be needed; keeling placed yrs ago   HARDWARE REMOVAL Right 04/25/2023   Procedure: REMOVAL, HARDWARE, FEMUR;  Surgeon: Vickki Hearing, MD;  Location: [facility];  Service: Orthopedics;  Laterality: Right;   INTRAMEDULLARY (IM) NAIL INTERTROCHANTERIC Left 04/23/2023   Procedure: FIXATION, FRACTURE, INTERTROCHANTERIC, WITH  INTRAMEDULLARY ROD;  Surgeon: Vickki Hearing, MD;  Location: [facility];  Service: Orthopedics;  Laterality: Left;   KIDNEY STONE SURGERY     ORIF HIP FRACTURE Right 11/20/2012   Procedure: OPEN REDUCTION INTERNAL FIXATION RIGHT HIP;  Surgeon: Darreld Mclean, MD;  Location: [facility];  Service: Orthopedics;  Laterality: Right;   stress fractures of legs     TUBAL LIGATION      Social History   Socioeconomic History   Marital status: Widowed    Spouse name: Not on file   Number of children: Not on file   Years of education: Not on file   Highest education level: Not on file  Occupational History   Occupation: retired  Tobacco Use   Smoking status: Never   Smokeless tobacco: Never  Vaping Use   Vaping status: Never Used  Substance and Sexual Activity   Alcohol use: No   Drug use: No   Sexual activity: Not Currently  Other Topics Concern   Not on file  Social History Narrative   Long term resident of [facility]    Social Drivers of Health   Financial Resource Strain: Not on file  Food Insecurity: Patient Declined (04/23/2023)   Hunger Vital Sign    Worried About Running Out of Food in the Last Year: Patient declined    Ran Out of Food in the Last Year: Patient declined  Transportation Needs: Patient Declined (04/23/2023)   PRAPARE - Administrator, Civil Service (Medical): Patient declined    Lack of Transportation (Non-Medical): Patient declined  Physical Activity: Not on file  Stress: Not on file  Social Connections: Patient Declined (04/23/2023)   Social Connection and Isolation Panel [NHANES]    Frequency of Communication with Friends and Family: Patient declined    Frequency of Social Gatherings with Friends and Family: Patient declined    Attends Religious Services: Patient declined    Database administrator or Organizations: Patient declined    Attends Banker Meetings: Patient declined    Marital Status: Patient declined  Intimate Partner Violence:  Patient Declined (04/23/2023)   Humiliation, Afraid, Rape, and Kick questionnaire    Fear of Current or Ex-Partner: Patient declined    Emotionally Abused: Patient declined    Physically Abused: Patient declined    Sexually Abused: Patient declined   Family History  Problem Relation Age of Onset   Heart attack Father    Heart disease Neg Hx       VITAL SIGNS BP 120/60   Pulse 88   Temp (!) 97 F (36.1 C)   Resp 18   Ht 5' (1.524 m)   Wt 107 lb 9.6 oz (48.8 kg)   SpO2 96%   BMI 21.01 kg/m   Outpatient Encounter Medications as of 05/09/2023  Medication Sig   acetaminophen (TYLENOL) 325 MG tablet Take 650 mg by mouth 2 (two) times daily. 10 am and 10 pm   Balsam [place]-Castor Oil (VENELEX) OINT Apply 1 Application topically.   bisacodyl (DULCOLAX) 5 MG EC tablet Take 1 tablet (5 mg total) by mouth daily as needed for moderate constipation.   docusate sodium (COLACE) 100 MG capsule Take 1 capsule (100 mg total) by mouth 2 (two) times daily.   enoxaparin (LOVENOX) 30 MG/0.3ML injection Inject 0.3 mLs (30 mg total) into the skin daily.   feeding supplement (ENSURE ENLIVE / ENSURE PLUS) LIQD Take 237 mLs by mouth 2 (two) times daily between meals.   HYDROcodone-acetaminophen (NORCO) 7.5-325 MG tablet Take 1-2 tablets by mouth every 4 (four) hours as needed for severe pain (pain score 7-10).   HYDROcodone-acetaminophen (NORCO/VICODIN) 5-325 MG tablet Take 1-2 tablets by mouth every 4 (four) hours as needed for moderate pain (pain score 4-6).   methimazole (TAPAZOLE) 5 MG tablet Take 5 mg by mouth daily.   methocarbamol (ROBAXIN) 500 MG tablet Take 1 tablet (500 mg total) by mouth every 6 (six) hours as needed for muscle spasms.   mirtazapine (REMERON) 7.5 MG tablet Take 7.5 mg by mouth at bedtime.   mupirocin ointment (BACTROBAN) 2 % Place 1 Application into the nose 2 (two) times daily for 60 doses. Use as directed 2 times daily for 5 days every other week for 6 weeks.   NON FORMULARY  Diet:Dysphagia 3 (mechanical soft), thin liquids. OK to have soft sandwiches per LPN request.   ondansetron (ZOFRAN) 4 MG tablet Take 1 tablet (4 mg total) by mouth every 6 (six) hours as needed for nausea.   OXYGEN Inhale 2 L into the lungs continuous.   polyethylene glycol (MIRALAX) 17 g packet Take 17 g by mouth 2 (two) times daily.   No facility-administered encounter medications on file as of 05/09/2023.     SIGNIFICANT DIAGNOSTIC EXAMS  PREVIOUS  09-20-21: dexa: t score: -5.527  05-31-22: right knee x-ray:  No definite radiographic evidence of acuate fracture or dislocation Mild joint effusion Mild degree of osteopenia Moderate osteoarthritis  05-31-22: right hip x-ray:  No radiographic evidence of acute fracture or dislocation S/p right ORIF with intramedullary rod  and screw fixation of a healed intertrochanteric fracture with neal anatomic alignment.     06-10-22: right lower extremity venous doppler No DVT right lower extremity   NO NEW EXAMS    LABS REVIEWED PREVIOUS     05-09-22: wbc 7.3; hgb 11.8; hct 38.2 plt 95.5 plt 181; glucose 120; bun 21; creat 0.73; k+ 4.3; na++ 141; ca 9.0; gfr >60; protein 7.2 albumin 3.1 tsh 0.221 vitamin B 12: 314 mag 2.5 06-20-22: protein 6.6; albumin 3.0 08-27-22: tsh 2.590 free t4: 0.81 free t3: 2.5  12-21-22: protein 6.1 albumin 2.1 01-23-23: wbc 5.6; hgb 11.7; hct 37.3; mcv 91.9 plt 225; glucose 98; bun 14; creat 0.61; k+ 3.8; na++ 137; ca 8.7; gfr >60; protein 6.5 albumin 3.2    NO NEW LABS.     Review of Systems  Unable to perform ROS: Dementia   Physical Exam Constitutional:      General: She is not in acute distress.    Appearance: She is well-developed. She is not diaphoretic.  Neck:     Thyroid: No thyromegaly.  Cardiovascular:     Rate and Rhythm: Normal rate and regular rhythm.     Pulses: Normal pulses.     Heart sounds: Normal heart sounds.  Pulmonary:     Effort: Pulmonary effort is normal. No respiratory distress.      Breath sounds: Normal breath sounds.  Abdominal:     General: Bowel sounds are normal. There is no distension.     Palpations: Abdomen is soft.     Tenderness: There is no abdominal tenderness.  Musculoskeletal:     Cervical back: Neck supple.     Right lower leg: No edema.     Left lower leg: No edema.     Comments: Status post bilateral hip fractures  Lymphadenopathy:     Cervical: No cervical adenopathy.  Skin:    General: Skin is warm and dry.  Neurological:     Mental Status: She is alert. Mental status is at baseline.  Psychiatric:        Mood and Affect: Mood normal.      ASSESSMENT/ PLAN:  TODAY  Closed pertrochanteric of left femur, sequela Closed displaced spiral fracture of shaft of right femur with delayed healing subsequent encounter Severe vascular dementia without behavioral disturbance; psychotic disturbance; mood disturbance or anxiety  Will continue current medications Will continue therapy as directed Will continue to monitor her status.   Time spent with patient 40 minutes: medications; dietary; therapy    [name] NP [facility]  call [phone]

## 2023-05-09 NOTE — Progress Notes (Signed)
 Patient ID: [name], female   DOB: [birth year], 89 y.o.   MRN: [id number]  Encounter Diagnosis  Name Primary?   Closed fracture of right hip with routine healing, subsequent encounter IM Nail 04/23/23 Yes    Chief Complaint  Patient presents with   Hip Pain    Left hip pain Dos 03/21    First postoperative follow-up visit for [name]  She had a left peritrochanteric subtroches fracture treated with cephalomedullary nail complicated by iatrogenic fracture right femur which was subsequently nailed on March 21  X-rays today  Patient is nonweightbearing and was nonweightbearing prior to surgery  All wounds look clean dry and intact there are no signs of infection.  We do note slight internal rotation of the left lower extremity excepted as part of the reduction which was very difficult  Imaging studies show fracture alignment maintained hardware in good position  Patient will need x-rays in 6 weeks.  I would recommend all x-rays to be done at the hospital before she comes to the office I will make those arrangements

## 2023-05-21 DIAGNOSIS — F0390 Unspecified dementia without behavioral disturbance: Secondary | ICD-10-CM | POA: Diagnosis not present

## 2023-05-21 DIAGNOSIS — M810 Age-related osteoporosis without current pathological fracture: Secondary | ICD-10-CM | POA: Diagnosis not present

## 2023-05-21 DIAGNOSIS — R1312 Dysphagia, oropharyngeal phase: Secondary | ICD-10-CM | POA: Diagnosis not present

## 2023-05-21 DIAGNOSIS — R488 Other symbolic dysfunctions: Secondary | ICD-10-CM | POA: Diagnosis not present

## 2023-05-22 DIAGNOSIS — M810 Age-related osteoporosis without current pathological fracture: Secondary | ICD-10-CM | POA: Diagnosis not present

## 2023-05-22 DIAGNOSIS — R1312 Dysphagia, oropharyngeal phase: Secondary | ICD-10-CM | POA: Diagnosis not present

## 2023-05-22 DIAGNOSIS — F0390 Unspecified dementia without behavioral disturbance: Secondary | ICD-10-CM | POA: Diagnosis not present

## 2023-05-22 DIAGNOSIS — R488 Other symbolic dysfunctions: Secondary | ICD-10-CM | POA: Diagnosis not present

## 2023-05-23 DIAGNOSIS — R1312 Dysphagia, oropharyngeal phase: Secondary | ICD-10-CM | POA: Diagnosis not present

## 2023-05-23 DIAGNOSIS — F0390 Unspecified dementia without behavioral disturbance: Secondary | ICD-10-CM | POA: Diagnosis not present

## 2023-05-23 DIAGNOSIS — R488 Other symbolic dysfunctions: Secondary | ICD-10-CM | POA: Diagnosis not present

## 2023-05-23 DIAGNOSIS — M810 Age-related osteoporosis without current pathological fracture: Secondary | ICD-10-CM | POA: Diagnosis not present

## 2023-05-26 ENCOUNTER — Non-Acute Institutional Stay (SKILLED_NURSING_FACILITY): Payer: Self-pay | Admitting: Adult Health

## 2023-05-26 ENCOUNTER — Encounter: Payer: Self-pay | Admitting: Adult Health

## 2023-05-26 DIAGNOSIS — D649 Anemia, unspecified: Secondary | ICD-10-CM

## 2023-05-26 DIAGNOSIS — M810 Age-related osteoporosis without current pathological fracture: Secondary | ICD-10-CM

## 2023-05-26 DIAGNOSIS — F0390 Unspecified dementia without behavioral disturbance: Secondary | ICD-10-CM | POA: Diagnosis not present

## 2023-05-26 DIAGNOSIS — E049 Nontoxic goiter, unspecified: Secondary | ICD-10-CM

## 2023-05-26 DIAGNOSIS — R1312 Dysphagia, oropharyngeal phase: Secondary | ICD-10-CM | POA: Diagnosis not present

## 2023-05-26 DIAGNOSIS — R488 Other symbolic dysfunctions: Secondary | ICD-10-CM | POA: Diagnosis not present

## 2023-05-26 NOTE — Progress Notes (Signed)
 Location:  [facility] Room Number: 112 Place of Service:  SNF (31)   CODE STATUS: dnr   Allergies  Allergen Reactions   Hydrocodone -Acetaminophen  Nausea And Vomiting    Chief Complaint  Patient presents with   Medical Management of Chronic Issues        Post menopausal osteoporosis: Goiter: Normochromic anemia:     HPI:  She is a 89 year old long term resident of this facility being seen for the management of her chronic illnesses: Post menopausal osteoporosis: Goiter: Normochromic anemia. There are no indications of uncontrolled pain. Her hgb is presently 8.6 is status post bilateral hip fractures with surgical interventions.   Past Medical History:  Diagnosis Date   Anxiety    Dementia (HCC)    GERD (gastroesophageal reflux disease)    Goiter    Hypertension    Major depression, recurrent, chronic (HCC) 02/22/2019   Osteoarthritis    Osteoporosis    Vitamin D  insufficiency     Past Surgical History:  Procedure Laterality Date   APPENDECTOMY     FEMUR IM NAIL Right 04/25/2023   Procedure: INSERTION, INTRAMEDULLARY ROD, FEMUR, RETROGRADE;  Surgeon: Darrin Emerald, MD;  Location: [facility];  Service: Orthopedics;  Laterality: Right;  arhtrx nail retrograde femoral   smith nephew dhs removal may be needed; keeling placed yrs ago   HARDWARE REMOVAL Right 04/25/2023   Procedure: REMOVAL, HARDWARE, FEMUR;  Surgeon: Darrin Emerald, MD;  Location: [facility];  Service: Orthopedics;  Laterality: Right;   INTRAMEDULLARY (IM) NAIL INTERTROCHANTERIC Left 04/23/2023   Procedure: FIXATION, FRACTURE, INTERTROCHANTERIC, WITH INTRAMEDULLARY ROD;  Surgeon: Darrin Emerald, MD;  Location: [facility];  Service: Orthopedics;  Laterality: Left;   KIDNEY STONE SURGERY     ORIF HIP FRACTURE Right 11/20/2012   Procedure: OPEN REDUCTION INTERNAL FIXATION RIGHT HIP;  Surgeon: Pleasant Brilliant, MD;  Location: [facility];  Service: Orthopedics;  Laterality: Right;   stress  fractures of legs     TUBAL LIGATION      Social History   Socioeconomic History   Marital status: Widowed    Spouse name: Not on file   Number of children: Not on file   Years of education: Not on file   Highest education level: Not on file  Occupational History   Occupation: retired  Tobacco Use   Smoking status: Never   Smokeless tobacco: Never  Vaping Use   Vaping status: Never Used  Substance and Sexual Activity   Alcohol  use: No   Drug use: No   Sexual activity: Not Currently  Other Topics Concern   Not on file  Social History Narrative   Long term resident of [facility]    Social Drivers of Health   Financial Resource Strain: Not on file  Food Insecurity: Patient Declined (04/23/2023)   Hunger Vital Sign    Worried About Running Out of Food in the Last Year: Patient declined    Ran Out of Food in the Last Year: Patient declined  Transportation Needs: Patient Declined (04/23/2023)   PRAPARE - Administrator, Civil Service (Medical): Patient declined    Lack of Transportation (Non-Medical): Patient declined  Physical Activity: Not on file  Stress: Not on file  Social Connections: Patient Declined (04/23/2023)   Social Connection and Isolation Panel [NHANES]    Frequency of Communication with Friends and Family: Patient declined    Frequency of Social Gatherings with Friends and Family: Patient declined  Attends Religious Services: Patient declined    Active Member of Clubs or Organizations: Patient declined    Attends Banker Meetings: Patient declined    Marital Status: Patient declined  Intimate Partner Violence: Patient Declined (04/23/2023)   Humiliation, Afraid, Rape, and Kick questionnaire    Fear of Current or Ex-Partner: Patient declined    Emotionally Abused: Patient declined    Physically Abused: Patient declined    Sexually Abused: Patient declined   Family History  Problem Relation Age of Onset   Heart attack Father    Heart  disease Neg Hx       VITAL SIGNS BP (!) 111/53   Pulse 80   Temp (!) 97.1 F (36.2 C)   Resp 20   Ht 4\' 10"  (1.473 m)   Wt 106 lb 3.2 oz (48.2 kg)   SpO2 95%   BMI 22.20 kg/m   Outpatient Encounter Medications as of 05/26/2023  Medication Sig   sennosides-docusate sodium  (SENOKOT-S) 8.6-50 MG tablet Take 1 tablet by mouth 2 times daily at 12 noon and 4 pm.   acetaminophen  (TYLENOL ) 325 MG tablet Take 650 mg by mouth 2 (two) times daily. 10 am and 10 pm   Balsam [place]-Castor Oil (VENELEX) OINT Apply 1 Application topically.   enoxaparin  (LOVENOX ) 30 MG/0.3ML injection Inject 0.3 mLs (30 mg total) into the skin daily.   feeding supplement (ENSURE ENLIVE / ENSURE PLUS) LIQD Take 237 mLs by mouth 2 (two) times daily between meals.   methimazole  (TAPAZOLE ) 5 MG tablet Take 5 mg by mouth daily.   methocarbamol  (ROBAXIN ) 500 MG tablet Take 1 tablet (500 mg total) by mouth every 6 (six) hours as needed for muscle spasms.   mirtazapine (REMERON) 7.5 MG tablet Take 7.5 mg by mouth at bedtime.   NON FORMULARY Diet:Dysphagia 3 (mechanical soft), thin liquids. OK to have soft sandwiches per LPN request.   ondansetron  (ZOFRAN ) 4 MG tablet Take 1 tablet (4 mg total) by mouth every 6 (six) hours as needed for nausea.   OXYGEN  Inhale 2 L into the lungs continuous.   [DISCONTINUED] bisacodyl  (DULCOLAX) 5 MG EC tablet Take 1 tablet (5 mg total) by mouth daily as needed for moderate constipation.   [DISCONTINUED] docusate sodium  (COLACE) 100 MG capsule Take 1 capsule (100 mg total) by mouth 2 (two) times daily.   [DISCONTINUED] HYDROcodone -acetaminophen  (NORCO/VICODIN) 5-325 MG tablet Take 1-2 tablets by mouth every 4 (four) hours as needed for moderate pain (pain score 4-6).   [DISCONTINUED] polyethylene glycol (MIRALAX ) 17 g packet Take 17 g by mouth 2 (two) times daily.   No facility-administered encounter medications on file as of 05/26/2023.     SIGNIFICANT DIAGNOSTIC  EXAMS   PREVIOUS  09-20-21: dexa: t score: -5.527  05-31-22: right knee x-ray:  No definite radiographic evidence of acuate fracture or dislocation Mild joint effusion Mild degree of osteopenia Moderate osteoarthritis  05-31-22: right hip x-ray:  No radiographic evidence of acute fracture or dislocation S/p right ORIF with intramedullary rod and screw fixation of a healed intertrochanteric fracture with neal anatomic alignment.     06-10-22: right lower extremity venous doppler No DVT right lower extremity   NO NEW EXAMS    LABS REVIEWED PREVIOUS     05-09-22: wbc 7.3; hgb 11.8; hct 38.2 plt 95.5 plt 181; glucose 120; bun 21; creat 0.73; k+ 4.3; na++ 141; ca 9.0; gfr >60; protein 7.2 albumin 3.1 tsh 0.221 vitamin B 12: 314 mag 2.5 06-20-22: protein 6.6;  albumin 3.0 08-27-22: tsh 2.590 free t4: 0.81 free t3: 2.5  12-21-22: protein 6.1 albumin 2.1 01-23-23: wbc 5.6; hgb 11.7; hct 37.3; mcv 91.9 plt 225; glucose 98; bun 14; creat 0.61; k+ 3.8; na++ 137; ca 8.7; gfr >60; protein 6.5 albumin 3.2    TODAY  03-20-23: tsh 3.268 03-25-23: wbc 10.8; hgb 10.9; hct 34.4; mcv 92.5 plt 366; glucose 212; bun 27; creat 0.72; k+ 3.9; na++ 141; ca 8.9; gfr >60; tsh 2.981; free t3: 2.7 04-27-23: wbc 9.7; hgb 8.6; hct 27.8; mcv 994.6 plt 240; glucose 137; bun 18; creat 0.52; k+ 4.1; na++ 138; ca 8.1 gfr >60   Review of Systems  Unable to perform ROS: Dementia   Physical Exam Constitutional:      General: She is not in acute distress.    Appearance: She is well-developed. She is not diaphoretic.  Neck:     Thyroid : Thyroid  mass and thyromegaly present.  Cardiovascular:     Rate and Rhythm: Normal rate and regular rhythm.     Heart sounds: Normal heart sounds.  Pulmonary:     Effort: Pulmonary effort is normal. No respiratory distress.     Breath sounds: Normal breath sounds.  Abdominal:     General: Bowel sounds are normal. There is no distension.     Palpations: Abdomen is soft.     Tenderness:  There is no abdominal tenderness.  Musculoskeletal:     Cervical back: Neck supple.     Right lower leg: No edema.     Left lower leg: No edema.     Comments: Bilateral hip fractures   Lymphadenopathy:     Cervical: No cervical adenopathy.  Skin:    General: Skin is warm and dry.  Neurological:     Mental Status: She is alert. Mental status is at baseline.  Psychiatric:        Mood and Affect: Mood normal.    ASSESSMENT/ PLAN:  TODAY  Post menopausal osteoporosis: t score -5.527; is on supplements; is status post bilateral hip fractures  2. Goiter: tsh 2.981; free t3: 2.7 is on tapazole  5 mg daily   3. Normochromic anemia: hgb 8.6 will monitor   PREVIOUS   4. Severe protein calorie malnutrition: albumin 2.9 will continue supplements as directed.   5. Chronic constipation: will continue senna s twice daily   6. Angina pectoris unspecified: no reports of chest pain present.   7. Primary osteoarthritis of multiple joints. Is off voltaren gel and will continue tylenol  650 mg twice daily   8. Bilateral hip fractures: is status post surgical repairs. Will continue lovenox  30 mg daily through 06-09-23.   9. GERD without esophagitis: has history of GIB is off PPI  10. Vascular dementia without behavioral disturbance: weight is 106 pounds will continue remeron 7.5 mg nightly. She is slowly losing weight. Her current weight is 106 pounds,   11. Essential hypertension: b/p 111/53 is off ARB   Will repeat hgb/hct   Britt Candle NP [facility]   call [phone]

## 2023-05-27 DIAGNOSIS — F0390 Unspecified dementia without behavioral disturbance: Secondary | ICD-10-CM | POA: Diagnosis not present

## 2023-05-27 DIAGNOSIS — R488 Other symbolic dysfunctions: Secondary | ICD-10-CM | POA: Diagnosis not present

## 2023-05-27 DIAGNOSIS — M810 Age-related osteoporosis without current pathological fracture: Secondary | ICD-10-CM | POA: Diagnosis not present

## 2023-05-27 DIAGNOSIS — R1312 Dysphagia, oropharyngeal phase: Secondary | ICD-10-CM | POA: Diagnosis not present

## 2023-05-28 DIAGNOSIS — F0390 Unspecified dementia without behavioral disturbance: Secondary | ICD-10-CM | POA: Diagnosis not present

## 2023-05-28 DIAGNOSIS — M810 Age-related osteoporosis without current pathological fracture: Secondary | ICD-10-CM | POA: Diagnosis not present

## 2023-05-28 DIAGNOSIS — R488 Other symbolic dysfunctions: Secondary | ICD-10-CM | POA: Diagnosis not present

## 2023-05-28 DIAGNOSIS — R1312 Dysphagia, oropharyngeal phase: Secondary | ICD-10-CM | POA: Diagnosis not present

## 2023-06-05 ENCOUNTER — Other Ambulatory Visit (HOSPITAL_COMMUNITY)
Admission: RE | Admit: 2023-06-05 | Discharge: 2023-06-05 | Disposition: A | Discharge status: Home / Self Care | Source: Skilled Nursing Facility | Attending: Adult Health | Admitting: Adult Health

## 2023-06-05 DIAGNOSIS — X58XXXD Exposure to other specified factors, subsequent encounter: Secondary | ICD-10-CM | POA: Insufficient documentation

## 2023-06-05 DIAGNOSIS — S72102D Unspecified trochanteric fracture of left femur, subsequent encounter for closed fracture with routine healing: Secondary | ICD-10-CM | POA: Diagnosis not present

## 2023-06-05 DIAGNOSIS — D649 Anemia, unspecified: Secondary | ICD-10-CM | POA: Insufficient documentation

## 2023-06-05 LAB — HEMOGLOBIN AND HEMATOCRIT, BLOOD
HCT: 35.4 % — ABNORMAL LOW (ref 36.0–46.0)
Hemoglobin: 11.1 g/dL — ABNORMAL LOW (ref 12.0–15.0)

## 2023-06-18 DIAGNOSIS — S72142G Displaced intertrochanteric fracture of left femur, subsequent encounter for closed fracture with delayed healing: Secondary | ICD-10-CM | POA: Insufficient documentation

## 2023-06-19 ENCOUNTER — Other Ambulatory Visit: Payer: Self-pay

## 2023-06-19 ENCOUNTER — Other Ambulatory Visit (INDEPENDENT_AMBULATORY_CARE_PROVIDER_SITE_OTHER): Payer: Self-pay

## 2023-06-19 ENCOUNTER — Encounter: Payer: Self-pay | Admitting: Orthopedic Surgery

## 2023-06-19 ENCOUNTER — Ambulatory Visit (INDEPENDENT_AMBULATORY_CARE_PROVIDER_SITE_OTHER): Admitting: Orthopedic Surgery

## 2023-06-19 DIAGNOSIS — S72142G Displaced intertrochanteric fracture of left femur, subsequent encounter for closed fracture with delayed healing: Secondary | ICD-10-CM

## 2023-06-19 DIAGNOSIS — S72001D Fracture of unspecified part of neck of right femur, subsequent encounter for closed fracture with routine healing: Secondary | ICD-10-CM | POA: Diagnosis not present

## 2023-06-19 DIAGNOSIS — S72491D Other fracture of lower end of right femur, subsequent encounter for closed fracture with routine healing: Secondary | ICD-10-CM

## 2023-06-19 NOTE — Progress Notes (Signed)
   There were no vitals taken for this visit.  There is no height or weight on file to calculate BMI.  Chief Complaint  Patient presents with   Post-op Follow-up    Both femur     Encounter Diagnoses  Name Primary?   Other closed fracture of distal end of right femur with routine healing, subsequent encounter Yes   Displaced intertrochanteric fracture of left femur, subsequent encounter for closed fracture with delayed healing 04/23/23     DOI/DOS/ Date:    Status post IM nailing left hip for intertrochanteric fracture and retrograde nailing right femur for iatrogenic distal femur fracture  According to relative with the patient today she has reached limit of what PT can do she is no longer getting any type of therapy   There is concerned about the Veterbrae coming thru the skin ???    #1 left intertrochanteric hip fracture status post IM nailing with trochanteric nail   Imaging shows fracture is in good position healing appropriately   #2 retrograde femoral nailing for iatrogenic fracture right femur   Healing appropriately implants intact no issues   #3 concerned about vertebrae coming through the skin  The patient has a severe kyphosis she has a pressure area not great about at this time very superficial which is appropriately padded  No other recommendations  There is concern from myself and the family that the patient has not returned to her bed to chair status as her fractures are healing she is not in any pain and the implants are in good position  I have asked physical therapy to call me to discuss the situation  X-rays both areas again in June   Questionable return to function prior to injury

## 2023-06-19 NOTE — Progress Notes (Signed)
   There were no vitals taken for this visit.  There is no height or weight on file to calculate BMI.  Chief Complaint  Patient presents with   Post-op Follow-up    Both femur     Encounter Diagnoses  Name Primary?   Closed fracture of right hip with routine healing, subsequent encounter IM Nail 04/25/23 Yes   Displaced intertrochanteric fracture of left femur, subsequent encounter for closed fracture with delayed healing 04/23/23     DOI/DOS/ Date:    Unchanged    Has reached limit of what PT can do she is no longer getting any type of therapy

## 2023-06-20 ENCOUNTER — Other Ambulatory Visit (HOSPITAL_COMMUNITY)
Admission: RE | Admit: 2023-06-20 | Discharge: 2023-06-20 | Disposition: A | Discharge status: Home / Self Care | Source: Skilled Nursing Facility | Attending: Adult Health | Admitting: Adult Health

## 2023-06-20 DIAGNOSIS — G9341 Metabolic encephalopathy: Secondary | ICD-10-CM | POA: Diagnosis not present

## 2023-06-20 LAB — HEPATIC FUNCTION PANEL
ALT: 15 U/L (ref 0–44)
AST: 21 U/L (ref 15–41)
Albumin: 3 g/dL — ABNORMAL LOW (ref 3.5–5.0)
Alkaline Phosphatase: 61 U/L (ref 38–126)
Bilirubin, Direct: 0.1 mg/dL (ref 0.0–0.2)
Indirect Bilirubin: 0.7 mg/dL (ref 0.3–0.9)
Total Bilirubin: 0.8 mg/dL (ref 0.0–1.2)
Total Protein: 6.5 g/dL (ref 6.5–8.1)

## 2023-06-25 ENCOUNTER — Encounter: Payer: Self-pay | Admitting: Adult Health

## 2023-06-25 ENCOUNTER — Non-Acute Institutional Stay (SKILLED_NURSING_FACILITY): Payer: Self-pay | Admitting: Adult Health

## 2023-06-25 DIAGNOSIS — E440 Moderate protein-calorie malnutrition: Secondary | ICD-10-CM | POA: Diagnosis not present

## 2023-06-25 DIAGNOSIS — I209 Angina pectoris, unspecified: Secondary | ICD-10-CM | POA: Diagnosis not present

## 2023-06-25 DIAGNOSIS — K5909 Other constipation: Secondary | ICD-10-CM | POA: Diagnosis not present

## 2023-06-25 NOTE — Progress Notes (Signed)
 Location:  [facility] Room Number: 114-W Place of Service:  SNF (31) Provider: Britt Candle, NP  CODE STATUS: DNR  Allergies  Allergen Reactions   Hydrocodone -Acetaminophen  Nausea And Vomiting    Chief Complaint  Patient presents with   Medical Management of Chronic Issues           Severe protein calorie malnutrition: Chronic constipation: Angina pectoris unspecified     HPI:  She is a 89 y.o. long term resident of this facility being seen for the management of her chronic illnesses: Severe protein calorie malnutrition: Chronic constipation: Angina pectoris unspecified. There are no reports of uncontrolled pain. She does get out of bed daily. Her weight remains stable.    Past Medical History:  Diagnosis Date   Anxiety    Dementia (HCC)    GERD (gastroesophageal reflux disease)    Goiter    Hypertension    Major depression, recurrent, chronic (HCC) 02/22/2019   Osteoarthritis    Osteoporosis    Vitamin D  insufficiency     Past Surgical History:  Procedure Laterality Date   APPENDECTOMY     FEMUR IM NAIL Right 04/25/2023   Procedure: INSERTION, INTRAMEDULLARY ROD, FEMUR, RETROGRADE;  Surgeon: Darrin Emerald, MD;  Location: [facility];  Service: Orthopedics;  Laterality: Right;  arhtrx nail retrograde femoral   smith nephew dhs removal may be needed; keeling placed yrs ago   HARDWARE REMOVAL Right 04/25/2023   Procedure: REMOVAL, HARDWARE, FEMUR;  Surgeon: Darrin Emerald, MD;  Location: [facility];  Service: Orthopedics;  Laterality: Right;   INTRAMEDULLARY (IM) NAIL INTERTROCHANTERIC Left 04/23/2023   Procedure: FIXATION, FRACTURE, INTERTROCHANTERIC, WITH INTRAMEDULLARY ROD;  Surgeon: Darrin Emerald, MD;  Location: [facility];  Service: Orthopedics;  Laterality: Left;   KIDNEY STONE SURGERY     ORIF HIP FRACTURE Right 11/20/2012   Procedure: OPEN REDUCTION INTERNAL FIXATION RIGHT HIP;  Surgeon: Pleasant Brilliant, MD;  Location: [facility];  Service:  Orthopedics;  Laterality: Right;   stress fractures of legs     TUBAL LIGATION      Social History   Socioeconomic History   Marital status: Widowed    Spouse name: Not on file   Number of children: Not on file   Years of education: Not on file   Highest education level: Not on file  Occupational History   Occupation: retired  Tobacco Use   Smoking status: Never   Smokeless tobacco: Never  Vaping Use   Vaping status: Never Used  Substance and Sexual Activity   Alcohol  use: No   Drug use: No   Sexual activity: Not Currently  Other Topics Concern   Not on file  Social History Narrative   Long term resident of [facility]    Social Drivers of Health   Financial Resource Strain: Not on file  Food Insecurity: Patient Declined (04/23/2023)   Hunger Vital Sign    Worried About Running Out of Food in the Last Year: Patient declined    Ran Out of Food in the Last Year: Patient declined  Transportation Needs: Patient Declined (04/23/2023)   PRAPARE - Administrator, Civil Service (Medical): Patient declined    Lack of Transportation (Non-Medical): Patient declined  Physical Activity: Not on file  Stress: Not on file  Social Connections: Patient Declined (04/23/2023)   Social Connection and Isolation Panel [NHANES]    Frequency of Communication with Friends and Family: Patient declined    Frequency of Social Gatherings with  Friends and Family: Patient declined    Attends Religious Services: Patient declined    Active Member of Clubs or Organizations: Patient declined    Attends Banker Meetings: Patient declined    Marital Status: Patient declined  Intimate Partner Violence: Patient Declined (04/23/2023)   Humiliation, Afraid, Rape, and Kick questionnaire    Fear of Current or Ex-Partner: Patient declined    Emotionally Abused: Patient declined    Physically Abused: Patient declined    Sexually Abused: Patient declined   Family History  Problem Relation Age  of Onset   Heart attack Father    Heart disease Neg Hx       VITAL SIGNS BP 126/87   Pulse 92   Temp (!) 97.3 F (36.3 C)   Resp 18   Ht 4\' 10"  (1.473 m)   Wt 108 lb 9.6 oz (49.3 kg)   SpO2 97%   BMI 22.70 kg/m   Outpatient Encounter Medications as of 06/25/2023  Medication Sig   acetaminophen  (TYLENOL ) 325 MG tablet Take 650 mg by mouth 3 (three) times daily.   Balsam [place]-Castor Oil (VENELEX) OINT Apply 1 Application topically.   methimazole  (TAPAZOLE ) 5 MG tablet Take 5 mg by mouth daily.   methocarbamol  (ROBAXIN ) 500 MG tablet Take 1 tablet (500 mg total) by mouth every 6 (six) hours as needed for muscle spasms.   mirtazapine (REMERON) 7.5 MG tablet Take 7.5 mg by mouth at bedtime.   NON FORMULARY Diet:Dysphagia 3 (mechanical soft), thin liquids. OK to have soft sandwiches per LPN request.   ondansetron  (ZOFRAN ) 4 MG tablet Take 1 tablet (4 mg total) by mouth every 6 (six) hours as needed for nausea.   OXYGEN  Inhale 2 L into the lungs continuous.   polyethylene glycol (MIRALAX  / GLYCOLAX ) 17 g packet Take 17 g by mouth 2 (two) times daily as needed.   sennosides-docusate sodium  (SENOKOT-S) 8.6-50 MG tablet Take 1 tablet by mouth 2 times daily at 12 noon and 4 pm.   enoxaparin  (LOVENOX ) 30 MG/0.3ML injection Inject 0.3 mLs (30 mg total) into the skin daily.   feeding supplement (ENSURE ENLIVE / ENSURE PLUS) LIQD Take 237 mLs by mouth 2 (two) times daily between meals. (Patient not taking: Reported on 06/25/2023)   No facility-administered encounter medications on file as of 06/25/2023.     SIGNIFICANT DIAGNOSTIC EXAMS  PREVIOUS  09-20-21: dexa: t score: -5.527  05-31-22: right knee x-ray:  No definite radiographic evidence of acuate fracture or dislocation Mild joint effusion Mild degree of osteopenia Moderate osteoarthritis  05-31-22: right hip x-ray:  No radiographic evidence of acute fracture or dislocation S/p right ORIF with intramedullary rod and screw fixation  of a healed intertrochanteric fracture with neal anatomic alignment.     06-10-22: right lower extremity venous doppler No DVT right lower extremity   NO NEW EXAMS    LABS REVIEWED PREVIOUS     06-20-22: protein 6.6; albumin 3.0 08-27-22: tsh 2.590 free t4: 0.81 free t3: 2.5  12-21-22: protein 6.1 albumin 2.1 01-23-23: wbc 5.6; hgb 11.7; hct 37.3; mcv 91.9 plt 225; glucose 98; bun 14; creat 0.61; k+ 3.8; na++ 137; ca 8.7; gfr >60; protein 6.5 albumin 3.2   03-20-23: tsh 3.268 03-25-23: wbc 10.8; hgb 10.9; hct 34.4; mcv 92.5 plt 366; glucose 212; bun 27; creat 0.72; k+ 3.9; na++ 141; ca 8.9; gfr >60; tsh 2.981; free t3: 2.7 04-27-23: wbc 9.7; hgb 8.6; hct 27.8; mcv 994.6 plt 240; glucose 137; bun 18;  creat 0.52; k+ 4.1; na++ 138; ca 8.1 gfr >60  NO NEW LABS.    Review of Systems  Unable to perform ROS: Dementia   Physical Exam Constitutional:      General: She is not in acute distress.    Appearance: She is well-developed. She is not diaphoretic.  Neck:     Thyroid : Thyroid  mass and thyromegaly present.  Cardiovascular:     Rate and Rhythm: Normal rate and regular rhythm.     Pulses: Normal pulses.     Heart sounds: Normal heart sounds.  Pulmonary:     Effort: Pulmonary effort is normal. No respiratory distress.     Breath sounds: Normal breath sounds.  Abdominal:     General: Bowel sounds are normal. There is no distension.     Palpations: Abdomen is soft.     Tenderness: There is no abdominal tenderness.  Musculoskeletal:     Cervical back: Neck supple.     Right lower leg: No edema.     Left lower leg: No edema.     Comments: Bilateral hip fractures    Lymphadenopathy:     Cervical: No cervical adenopathy.  Skin:    General: Skin is warm and dry.  Neurological:     Mental Status: She is alert. Mental status is at baseline.  Psychiatric:        Mood and Affect: Mood normal.     ASSESSMENT/ PLAN:  TODAY  Severe protein calorie malnutrition: albumin 2.9; will  continue supplements as directed   2. Chronic constipation: will continue senna s twice daily   3. Angina pectoris unspecified: no reports of chest pain.   PREVIOUS   4. Primary osteoarthritis of multiple joints. Is off voltaren gel and will continue tylenol  650 mg twice daily   5. Bilateral hip fractures: is status post surgical repairs. Will continue lovenox  30 mg daily through 06-09-23.   6. GERD without esophagitis: has history of GIB is off PPI  7. Vascular dementia without behavioral disturbance: weight is 108 pounds will continue remeron 7.5 mg nightly. She is slowly losing weight.   8. Essential hypertension: b/p 126/87 is off ARB   9. Post menopausal osteoporosis: t score -5.527; is on supplements; is status post bilateral hip fractures  10. Goiter: tsh 2.981; free t3: 2.7 is on tapazole  5 mg daily   11. Normochromic anemia: hgb 8.6 will monitor    Britt Candle NP [facility]   call [phone]

## 2023-06-30 DIAGNOSIS — M85842 Other specified disorders of bone density and structure, left hand: Secondary | ICD-10-CM | POA: Diagnosis not present

## 2023-06-30 DIAGNOSIS — M1812 Unilateral primary osteoarthritis of first carpometacarpal joint, left hand: Secondary | ICD-10-CM | POA: Diagnosis not present

## 2023-06-30 DIAGNOSIS — M19042 Primary osteoarthritis, left hand: Secondary | ICD-10-CM | POA: Diagnosis not present

## 2023-07-23 DIAGNOSIS — M6281 Muscle weakness (generalized): Secondary | ICD-10-CM | POA: Diagnosis not present

## 2023-07-24 DIAGNOSIS — M6281 Muscle weakness (generalized): Secondary | ICD-10-CM | POA: Diagnosis not present

## 2023-07-25 DIAGNOSIS — M6281 Muscle weakness (generalized): Secondary | ICD-10-CM | POA: Diagnosis not present

## 2023-07-28 DIAGNOSIS — M6281 Muscle weakness (generalized): Secondary | ICD-10-CM | POA: Diagnosis not present

## 2023-07-29 DIAGNOSIS — M6281 Muscle weakness (generalized): Secondary | ICD-10-CM | POA: Diagnosis not present

## 2023-07-30 ENCOUNTER — Encounter: Payer: Self-pay | Admitting: Adult Health

## 2023-07-30 ENCOUNTER — Non-Acute Institutional Stay (SKILLED_NURSING_FACILITY): Payer: Self-pay | Admitting: Adult Health

## 2023-07-30 DIAGNOSIS — M6281 Muscle weakness (generalized): Secondary | ICD-10-CM | POA: Diagnosis not present

## 2023-07-30 DIAGNOSIS — M150 Primary generalized (osteo)arthritis: Secondary | ICD-10-CM | POA: Diagnosis not present

## 2023-07-30 DIAGNOSIS — S72001S Fracture of unspecified part of neck of right femur, sequela: Secondary | ICD-10-CM | POA: Diagnosis not present

## 2023-07-30 DIAGNOSIS — K219 Gastro-esophageal reflux disease without esophagitis: Secondary | ICD-10-CM | POA: Diagnosis not present

## 2023-07-30 DIAGNOSIS — S72102S Unspecified trochanteric fracture of left femur, sequela: Secondary | ICD-10-CM | POA: Diagnosis not present

## 2023-07-30 NOTE — Progress Notes (Signed)
 Location:  [facility] Room Number: 114W Place of Service:  SNF (31)   CODE STATUS: DNR  Allergies  Allergen Reactions   Hydrocodone -Acetaminophen  Nausea And Vomiting    Chief Complaint  Patient presents with   Medical Management of Chronic Issues           Primary osteoarthritis of multiple joints:  Bilateral hip fractures:  GERD without esophagitis    HPI:  She is a 89 y.o. long term resident of this facility being seen for the management of her chronic illnesses:Primary osteoarthritis of multiple joints:  Bilateral hip fractures:  GERD without esophagitis. There are no reports of chest pain or heart burn, she is tolerating being off PPI. There are no reports of uncontrolled pain.    Past Medical History:  Diagnosis Date   Anxiety    Dementia (HCC)    GERD (gastroesophageal reflux disease)    Goiter    Hypertension    Major depression, recurrent, chronic (HCC) 02/22/2019   Osteoarthritis    Osteoporosis    Vitamin D  insufficiency     Past Surgical History:  Procedure Laterality Date   APPENDECTOMY     FEMUR IM NAIL Right 04/25/2023   Procedure: INSERTION, INTRAMEDULLARY ROD, FEMUR, RETROGRADE;  Surgeon: Margrette Taft BRAVO, MD;  Location: [facility];  Service: Orthopedics;  Laterality: Right;  arhtrx nail retrograde femoral   smith nephew dhs removal may be needed; keeling placed yrs ago   HARDWARE REMOVAL Right 04/25/2023   Procedure: REMOVAL, HARDWARE, FEMUR;  Surgeon: Margrette Taft BRAVO, MD;  Location: [facility];  Service: Orthopedics;  Laterality: Right;   INTRAMEDULLARY (IM) NAIL INTERTROCHANTERIC Left 04/23/2023   Procedure: FIXATION, FRACTURE, INTERTROCHANTERIC, WITH INTRAMEDULLARY ROD;  Surgeon: Margrette Taft BRAVO, MD;  Location: [facility];  Service: Orthopedics;  Laterality: Left;   KIDNEY STONE SURGERY     ORIF HIP FRACTURE Right 11/20/2012   Procedure: OPEN REDUCTION INTERNAL FIXATION RIGHT HIP;  Surgeon: Lemond Stable, MD;  Location: [facility];   Service: Orthopedics;  Laterality: Right;   stress fractures of legs     TUBAL LIGATION      Social History   Socioeconomic History   Marital status: Widowed    Spouse name: Not on file   Number of children: Not on file   Years of education: Not on file   Highest education level: Not on file  Occupational History   Occupation: retired  Tobacco Use   Smoking status: Never   Smokeless tobacco: Never  Vaping Use   Vaping status: Never Used  Substance and Sexual Activity   Alcohol  use: No   Drug use: No   Sexual activity: Not Currently  Other Topics Concern   Not on file  Social History Narrative   Long term resident of [facility]    Social Drivers of Health   Financial Resource Strain: Not on file  Food Insecurity: Patient Declined (04/23/2023)   Hunger Vital Sign    Worried About Running Out of Food in the Last Year: Patient declined    Ran Out of Food in the Last Year: Patient declined  Transportation Needs: Patient Declined (04/23/2023)   PRAPARE - Administrator, Civil Service (Medical): Patient declined    Lack of Transportation (Non-Medical): Patient declined  Physical Activity: Not on file  Stress: Not on file  Social Connections: Patient Declined (04/23/2023)   Social Connection and Isolation Panel    Frequency of Communication with Friends and Family: Patient declined  Frequency of Social Gatherings with Friends and Family: Patient declined    Attends Religious Services: Patient declined    Active Member of Clubs or Organizations: Patient declined    Attends Banker Meetings: Patient declined    Marital Status: Patient declined  Intimate Partner Violence: Patient Declined (04/23/2023)   Humiliation, Afraid, Rape, and Kick questionnaire    Fear of Current or Ex-Partner: Patient declined    Emotionally Abused: Patient declined    Physically Abused: Patient declined    Sexually Abused: Patient declined   Family History  Problem Relation Age  of Onset   Heart attack Father    Heart disease Neg Hx       VITAL SIGNS BP 137/80   Pulse 78   Temp 97.6 F (36.4 C)   Resp 20   Ht 4' 10 (1.473 m)   Wt 104 lb (47.2 kg)   SpO2 98%   BMI 21.74 kg/m   Outpatient Encounter Medications as of 07/30/2023  Medication Sig   acetaminophen  (TYLENOL ) 325 MG tablet Take 650 mg by mouth 3 (three) times daily.   Balsam [place]-Castor Oil (VENELEX) OINT Apply 1 Application topically.   diclofenac Sodium (VOLTAREN ARTHRITIS PAIN) 1 % GEL Apply 2 g topically 3 (three) times daily.   methimazole  (TAPAZOLE ) 5 MG tablet Take 5 mg by mouth daily.   methocarbamol  (ROBAXIN ) 500 MG tablet Take 1 tablet (500 mg total) by mouth every 6 (six) hours as needed for muscle spasms.   mirtazapine (REMERON) 7.5 MG tablet Take 7.5 mg by mouth every other day. HS   NON FORMULARY Diet:Dysphagia 3 (mechanical soft), thin liquids. OK to have soft sandwiches per LPN request.   ondansetron  (ZOFRAN ) 4 MG tablet Take 1 tablet (4 mg total) by mouth every 6 (six) hours as needed for nausea.   polyethylene glycol (MIRALAX  / GLYCOLAX ) 17 g packet Take 17 g by mouth 2 (two) times daily as needed.   sennosides-docusate sodium  (SENOKOT-S) 8.6-50 MG tablet Take 1 tablet by mouth 2 times daily at 12 noon and 4 pm.   [DISCONTINUED] mirtazapine (REMERON) 7.5 MG tablet Take 7.5 mg by mouth at bedtime.   OXYGEN  Inhale 2 L into the lungs continuous. (Patient not taking: Reported on 07/30/2023)   [DISCONTINUED] enoxaparin  (LOVENOX ) 30 MG/0.3ML injection Inject 0.3 mLs (30 mg total) into the skin daily. (Patient not taking: Reported on 07/30/2023)   [DISCONTINUED] feeding supplement (ENSURE ENLIVE / ENSURE PLUS) LIQD Take 237 mLs by mouth 2 (two) times daily between meals. (Patient not taking: Reported on 07/30/2023)   No facility-administered encounter medications on file as of 07/30/2023.     SIGNIFICANT DIAGNOSTIC EXAMS  LABS REVIEWED PREVIOUS     08-27-22: tsh 2.590 free t4: 0.81  free t3: 2.5  12-21-22: protein 6.1 albumin 2.1 01-23-23: wbc 5.6; hgb 11.7; hct 37.3; mcv 91.9 plt 225; glucose 98; bun 14; creat 0.61; k+ 3.8; na++ 137; ca 8.7; gfr >60; protein 6.5 albumin 3.2   03-20-23: tsh 3.268 03-25-23: wbc 10.8; hgb 10.9; hct 34.4; mcv 92.5 plt 366; glucose 212; bun 27; creat 0.72; k+ 3.9; na++ 141; ca 8.9; gfr >60; tsh 2.981; free t3: 2.7 04-27-23: wbc 9.7; hgb 8.6; hct 27.8; mcv 994.6 plt 240; glucose 137; bun 18; creat 0.52; k+ 4.1; na++ 138; ca 8.1 gfr >60 06-05-23: hgb 11.1; hct 35.4 06-20-23; liver normal protein 6.5 albumin 3.0   NO NEW LABS.    Review of Systems  Unable to perform ROS: Dementia  Physical Exam Constitutional:      General: She is not in acute distress.    Appearance: She is well-developed. She is not diaphoretic.  Neck:     Thyroid : Thyroid  mass and thyromegaly present.   Cardiovascular:     Rate and Rhythm: Normal rate and regular rhythm.     Pulses: Normal pulses.     Heart sounds: Normal heart sounds.  Pulmonary:     Effort: Pulmonary effort is normal. No respiratory distress.     Breath sounds: Normal breath sounds.  Abdominal:     General: Bowel sounds are normal. There is no distension.     Palpations: Abdomen is soft.     Tenderness: There is no abdominal tenderness.   Musculoskeletal:     Cervical back: Neck supple.     Right lower leg: No edema.     Left lower leg: No edema.     Comments: Bilateral hip fractures     Lymphadenopathy:     Cervical: No cervical adenopathy.   Skin:    General: Skin is warm and dry.   Neurological:     Mental Status: She is alert. Mental status is at baseline.   Psychiatric:        Mood and Affect: Mood normal.     ASSESSMENT/ PLAN:  TODAY  Primary osteoarthritis of multiple joints: will continue tylenol  650 mg twice daily   2. Bilateral hip fractures: is status post surgical repair  3. GERD without esophagitis: has history of GIB: is off PPI   PREVIOUS   4. Vascular  dementia without behavioral disturbance: weight is 104 pounds will continue remeron 7.5 mg every other night. She is slowly losing weight.   5. Essential hypertension: b/p 137/80 is off ARB   6. Post menopausal osteoporosis: t score -5.527; is on supplements; is status post bilateral hip fractures  7. Goiter: tsh 2.981; free t3: 2.7 is on tapazole  5 mg daily   8. Normochromic anemia: hgb 8.6 will monitor   9. Severe protein calorie malnutrition: albumin 3.0; will continue supplements as directed   10. Chronic constipation: will continue senna s twice daily   11. Angina pectoris unspecified: no reports of chest pain.     Barnie Seip NP [facility]  call [phone]

## 2023-07-31 ENCOUNTER — Other Ambulatory Visit (INDEPENDENT_AMBULATORY_CARE_PROVIDER_SITE_OTHER): Payer: Self-pay

## 2023-07-31 ENCOUNTER — Other Ambulatory Visit: Payer: Self-pay | Admitting: Orthopedic Surgery

## 2023-07-31 ENCOUNTER — Ambulatory Visit (INDEPENDENT_AMBULATORY_CARE_PROVIDER_SITE_OTHER): Admitting: Orthopedic Surgery

## 2023-07-31 ENCOUNTER — Encounter: Payer: Self-pay | Admitting: Orthopedic Surgery

## 2023-07-31 DIAGNOSIS — S72142G Displaced intertrochanteric fracture of left femur, subsequent encounter for closed fracture with delayed healing: Secondary | ICD-10-CM

## 2023-07-31 DIAGNOSIS — M6281 Muscle weakness (generalized): Secondary | ICD-10-CM | POA: Diagnosis not present

## 2023-07-31 DIAGNOSIS — S72001D Fracture of unspecified part of neck of right femur, subsequent encounter for closed fracture with routine healing: Secondary | ICD-10-CM

## 2023-07-31 NOTE — Progress Notes (Signed)
 Fracture care postop follow-up status post intertrochanteric fracture left hip treated with cephalic medullary nail using a long nail  Iatrogenic fracture right femur treated with retrograde medullary nailing  Chief Complaint  Patient presents with   Post-op Follow-up   Dates of surgery 04/23/2023 left hip And 04/25/2023 right femur  DG FEMUR, MIN 2 VIEWS RIGHT Result Date: 07/31/2023 X-rays right femur status post right femur fracture An old intertrochanteric fracture with an extra medullary screw and sideplate with 2 of the prior noted screws removed.  There is a retrograde nail in the femur which shows that the femur is here to healed anatomically Overall impression retrograde nail complete healing of femur fracture   DG HIP UNILAT WITH PELVIS 2-3 VIEWS LEFT Result Date: 07/31/2023 History of left hip fracture with internal fixation Long cephalomedullary nail left hip for peritrochanteric fracture shows fracture healing and no hardware complication Impression healed left peritrochanteric fracture     Assessment and plan  Encounter Diagnoses  Name Primary?   Displaced intertrochanteric fracture of left femur, subsequent encounter for closed fracture with delayed healing 04/23/23 Yes   Closed fracture of right hip with routine healing, subsequent encounter IM Nail 04/25/23    Both fractures have healed  The patient can weight-bear as tolerated with assistance and be bed to chair daily as she was not walking prior to surgery  No follow-ups required

## 2023-07-31 NOTE — Progress Notes (Signed)
   There were no vitals taken for this visit.  There is no height or weight on file to calculate BMI.  Chief Complaint  Patient presents with   Post-op Follow-up    Encounter Diagnoses  Name Primary?   Displaced intertrochanteric fracture of left femur, subsequent encounter for closed fracture with delayed healing 04/23/23 Yes   Closed fracture of right hip with routine healing, subsequent encounter IM Nail 04/25/23     DOI/DOS/ Date: 04/23/23  04/25/23

## 2023-08-01 DIAGNOSIS — M6281 Muscle weakness (generalized): Secondary | ICD-10-CM | POA: Diagnosis not present

## 2023-08-04 DIAGNOSIS — M6281 Muscle weakness (generalized): Secondary | ICD-10-CM | POA: Diagnosis not present

## 2023-08-05 DIAGNOSIS — M6281 Muscle weakness (generalized): Secondary | ICD-10-CM | POA: Diagnosis not present

## 2023-08-06 DIAGNOSIS — M6281 Muscle weakness (generalized): Secondary | ICD-10-CM | POA: Diagnosis not present

## 2023-08-07 ENCOUNTER — Encounter: Payer: Self-pay | Admitting: Adult Health

## 2023-08-07 ENCOUNTER — Non-Acute Institutional Stay (SKILLED_NURSING_FACILITY): Payer: Self-pay | Admitting: Adult Health

## 2023-08-07 DIAGNOSIS — I700 Atherosclerosis of aorta: Secondary | ICD-10-CM

## 2023-08-07 DIAGNOSIS — J9611 Chronic respiratory failure with hypoxia: Secondary | ICD-10-CM | POA: Diagnosis not present

## 2023-08-07 DIAGNOSIS — E440 Moderate protein-calorie malnutrition: Secondary | ICD-10-CM | POA: Diagnosis not present

## 2023-08-07 DIAGNOSIS — M6281 Muscle weakness (generalized): Secondary | ICD-10-CM | POA: Diagnosis not present

## 2023-08-07 NOTE — Progress Notes (Signed)
 Location:  [facility] Room Number: 114W Place of Service:  SNF (31)   CODE STATUS: DNR  Allergies  Allergen Reactions   Hydrocodone -Acetaminophen  Nausea And Vomiting    Chief Complaint  Patient presents with   Acute Visit    Care plan meeting     HPI:  We have come together for her care plan meeting. BIMS no mood no. She is out of bed to wheelchair. No falls. She requires dependent assist with all her adl care. She is incontinent of bladder and bowel. Dietary: D1 requires assist with meals; appetite without change weight is 104 pounds. Therapy: left hand splint. Activities: has brief interactions. She will continue to be followed for her chronic illnesses including: Aortic atherosclerosis  Chronic respiratory failure with hypoxia   Moderate protein calorie malnutrition  Past Medical History:  Diagnosis Date   Anxiety    Dementia (HCC)    GERD (gastroesophageal reflux disease)    Goiter    Hypertension    Major depression, recurrent, chronic (HCC) 02/22/2019   Osteoarthritis    Osteoporosis    Vitamin D  insufficiency     Past Surgical History:  Procedure Laterality Date   APPENDECTOMY     FEMUR IM NAIL Right 04/25/2023   Procedure: INSERTION, INTRAMEDULLARY ROD, FEMUR, RETROGRADE;  Surgeon: Margrette Taft BRAVO, MD;  Location: [facility];  Service: Orthopedics;  Laterality: Right;  arhtrx nail retrograde femoral   smith nephew dhs removal may be needed; keeling placed yrs ago   HARDWARE REMOVAL Right 04/25/2023   Procedure: REMOVAL, HARDWARE, FEMUR;  Surgeon: Margrette Taft BRAVO, MD;  Location: [facility];  Service: Orthopedics;  Laterality: Right;   INTRAMEDULLARY (IM) NAIL INTERTROCHANTERIC Left 04/23/2023   Procedure: FIXATION, FRACTURE, INTERTROCHANTERIC, WITH INTRAMEDULLARY ROD;  Surgeon: Margrette Taft BRAVO, MD;  Location: [facility];  Service: Orthopedics;  Laterality: Left;   KIDNEY STONE SURGERY     ORIF HIP FRACTURE Right 11/20/2012   Procedure: OPEN  REDUCTION INTERNAL FIXATION RIGHT HIP;  Surgeon: Lemond Stable, MD;  Location: [facility];  Service: Orthopedics;  Laterality: Right;   stress fractures of legs     TUBAL LIGATION      Social History   Socioeconomic History   Marital status: Widowed    Spouse name: Not on file   Number of children: Not on file   Years of education: Not on file   Highest education level: Not on file  Occupational History   Occupation: retired  Tobacco Use   Smoking status: Never   Smokeless tobacco: Never  Vaping Use   Vaping status: Never Used  Substance and Sexual Activity   Alcohol  use: No   Drug use: No   Sexual activity: Not Currently  Other Topics Concern   Not on file  Social History Narrative   Long term resident of [facility]    Social Drivers of Health   Financial Resource Strain: Not on file  Food Insecurity: Patient Declined (04/23/2023)   Hunger Vital Sign    Worried About Running Out of Food in the Last Year: Patient declined    Ran Out of Food in the Last Year: Patient declined  Transportation Needs: Patient Declined (04/23/2023)   PRAPARE - Administrator, Civil Service (Medical): Patient declined    Lack of Transportation (Non-Medical): Patient declined  Physical Activity: Not on file  Stress: Not on file  Social Connections: Patient Declined (04/23/2023)   Social Connection and Isolation Panel    Frequency of  Communication with Friends and Family: Patient declined    Frequency of Social Gatherings with Friends and Family: Patient declined    Attends Religious Services: Patient declined    Database administrator or Organizations: Patient declined    Attends Banker Meetings: Patient declined    Marital Status: Patient declined  Intimate Partner Violence: Patient Declined (04/23/2023)   Humiliation, Afraid, Rape, and Kick questionnaire    Fear of Current or Ex-Partner: Patient declined    Emotionally Abused: Patient declined    Physically Abused: Patient  declined    Sexually Abused: Patient declined   Family History  Problem Relation Age of Onset   Heart attack Father    Heart disease Neg Hx       VITAL SIGNS BP 118/76   Pulse 96   Temp (!) 97.3 F (36.3 C)   Resp 20   Ht 4' 10 (1.473 m)   Wt 105 lb 9.6 oz (47.9 kg)   SpO2 96%   BMI 22.07 kg/m   Outpatient Encounter Medications as of 08/07/2023  Medication Sig   acetaminophen  (TYLENOL ) 325 MG tablet Take 650 mg by mouth 3 (three) times daily.   Balsam [place]-Castor Oil (VENELEX) OINT Apply 1 Application topically.   diclofenac Sodium (VOLTAREN ARTHRITIS PAIN) 1 % GEL Apply 2 g topically 3 (three) times daily.   methimazole  (TAPAZOLE ) 5 MG tablet Take 5 mg by mouth daily.   methocarbamol  (ROBAXIN ) 500 MG tablet Take 1 tablet (500 mg total) by mouth every 6 (six) hours as needed for muscle spasms.   mirtazapine (REMERON) 7.5 MG tablet Take 7.5 mg by mouth every other day. HS   NON FORMULARY Diet:Dysphagia 3 (mechanical soft), thin liquids. OK to have soft sandwiches per LPN request.   ondansetron  (ZOFRAN ) 4 MG tablet Take 1 tablet (4 mg total) by mouth every 6 (six) hours as needed for nausea.   polyethylene glycol (MIRALAX  / GLYCOLAX ) 17 g packet Take 17 g by mouth 2 (two) times daily as needed.   sennosides-docusate sodium  (SENOKOT-S) 8.6-50 MG tablet Take 1 tablet by mouth 2 times daily at 12 noon and 4 pm.   [DISCONTINUED] OXYGEN  Inhale 2 L into the lungs continuous. (Patient not taking: Reported on 08/07/2023)   No facility-administered encounter medications on file as of 08/07/2023.     SIGNIFICANT DIAGNOSTIC EXAMS  LABS REVIEWED PREVIOUS     08-27-22: tsh 2.590 free t4: 0.81 free t3: 2.5  12-21-22: protein 6.1 albumin 2.1 01-23-23: wbc 5.6; hgb 11.7; hct 37.3; mcv 91.9 plt 225; glucose 98; bun 14; creat 0.61; k+ 3.8; na++ 137; ca 8.7; gfr >60; protein 6.5 albumin 3.2   03-20-23: tsh 3.268 03-25-23: wbc 10.8; hgb 10.9; hct 34.4; mcv 92.5 plt 366; glucose 212; bun 27; creat  0.72; k+ 3.9; na++ 141; ca 8.9; gfr >60; tsh 2.981; free t3: 2.7 04-27-23: wbc 9.7; hgb 8.6; hct 27.8; mcv 994.6 plt 240; glucose 137; bun 18; creat 0.52; k+ 4.1; na++ 138; ca 8.1 gfr >60 06-05-23: hgb 11.1; hct 35.4 06-20-23; liver normal protein 6.5 albumin 3.0   NO NEW LABS.    Review of Systems  Unable to perform ROS: Dementia    Physical Exam Constitutional:      General: She is not in acute distress.    Appearance: She is well-developed. She is not diaphoretic.  Neck:     Thyroid : Thyroid  mass and thyromegaly present.  Cardiovascular:     Rate and Rhythm: Normal rate and regular  rhythm.     Pulses: Normal pulses.     Heart sounds: Normal heart sounds.  Pulmonary:     Effort: Pulmonary effort is normal. No respiratory distress.     Breath sounds: Normal breath sounds.  Abdominal:     General: Bowel sounds are normal. There is no distension.     Palpations: Abdomen is soft.     Tenderness: There is no abdominal tenderness.  Musculoskeletal:     Cervical back: Neck supple.     Right lower leg: No edema.     Left lower leg: No edema.     Comments: Bilateral hip fractures     Lymphadenopathy:     Cervical: No cervical adenopathy.  Skin:    General: Skin is warm and dry.  Neurological:     Mental Status: She is alert. Mental status is at baseline.  Psychiatric:        Mood and Affect: Mood normal.     ASSESSMENT/ PLAN:  TODAY  Aortic atherosclerosis Chronic respiratory failure with hypoxia Moderate protein calorie malnutrition  Will continue current medications Will continue current plan of care Will continue to monitor her status.   Time spent with patient: 40 minutes; medications; plan of care; dietary    Barnie Seip NP [facility]  call [phone]

## 2023-08-08 DIAGNOSIS — M6281 Muscle weakness (generalized): Secondary | ICD-10-CM | POA: Diagnosis not present

## 2023-08-11 DIAGNOSIS — M6281 Muscle weakness (generalized): Secondary | ICD-10-CM | POA: Diagnosis not present

## 2023-08-12 DIAGNOSIS — M6281 Muscle weakness (generalized): Secondary | ICD-10-CM | POA: Diagnosis not present

## 2023-08-13 DIAGNOSIS — M6281 Muscle weakness (generalized): Secondary | ICD-10-CM | POA: Diagnosis not present

## 2023-08-14 DIAGNOSIS — M6281 Muscle weakness (generalized): Secondary | ICD-10-CM | POA: Diagnosis not present

## 2023-08-25 ENCOUNTER — Encounter: Payer: Self-pay | Admitting: Internal Medicine

## 2023-08-25 ENCOUNTER — Non-Acute Institutional Stay (SKILLED_NURSING_FACILITY): Payer: Self-pay | Admitting: Internal Medicine

## 2023-08-25 DIAGNOSIS — E440 Moderate protein-calorie malnutrition: Secondary | ICD-10-CM | POA: Diagnosis not present

## 2023-08-25 DIAGNOSIS — F01C0 Vascular dementia, severe, without behavioral disturbance, psychotic disturbance, mood disturbance, and anxiety: Secondary | ICD-10-CM | POA: Diagnosis not present

## 2023-08-25 DIAGNOSIS — E049 Nontoxic goiter, unspecified: Secondary | ICD-10-CM | POA: Diagnosis not present

## 2023-08-25 DIAGNOSIS — D649 Anemia, unspecified: Secondary | ICD-10-CM | POA: Diagnosis not present

## 2023-08-25 NOTE — Assessment & Plan Note (Signed)
 She can provide no meaningful history, confabulating nonsensically.

## 2023-08-25 NOTE — Assessment & Plan Note (Signed)
 As noted under Goiter in the problem list, anemia has resolved.  Periodic monitor indicated while on methimazole .

## 2023-08-25 NOTE — Progress Notes (Unsigned)
   NURSING HOME LOCATION:  [facility] ROOM NUMBER: 114 W  CODE STATUS:  DNR  PCP: Landy Barnie RAMAN, NP   This is a nursing facility follow up visit of chronic medical diagnoses to document compliance with Regulation 483.30 (c) in The Long Term Care Survey Manual Phase 2 which mandates caregiver visit ( visits can alternate among physician, PA or NP as per statutes) within 10 days of 30 days / 60 days/ 90 days post admission to SNF date  .  Interim medical record and care since last SNF visit was updated with review of diagnostic studies and change in clinical status since last visit were documented.  HPI: She is a permanent resident of facility with medical diagnoses of major depression, recurrent; dementia; GERD; history of goiter; OA; osteoporosis; vitamin D  deficiency; history of nephrolithiasis; and essential hypertension. On 06/20/2023 albumin was 3.0 and total protein was normal at 6.5.  Serially the protein/caloric malnutrition is stable to slightly improved.  Anemia has significantly improved with hemoglobin/hematocrit rising from 8.3/26.2 up to 11.1/35.4. TSH was 2.981 on 03/25/2023.  Serially it has remained therapeutic on methimazole .  Review of systems: Dementia invalidated responses.  Her voice is very weak and barely discernible.  She stated I am doing all right.  Otherwise there was meaningless confabulation.  Physical exam:  Pertinent or positive findings: She appears her age suboptimally nourished.  Eyebrows are decreased laterally.  She was in bed with eyes open.  She initially did not focus on the examiner.  She is wearing nasal oxygen .  The maxilla is essentially edentulous with erosions to and below the gumline.  She is missing multiple mandibular teeth as well.  The mandibular teeth are currently.  Large goiter is visible.  Slight tachycardia is noted.  Breath sounds are decreased.  Pedal pulses are markedly decreased to palpation.  She has trace edema at  the sock line.  Interosseous wasting is present.  General appearance:  no acute distress, increased work of breathing is present.   Lymphatic: No lymphadenopathy about the head, neck, axilla. Eyes: No conjunctival inflammation or lid edema is present. There is no scleral icterus. Ears:  External ear exam shows no significant lesions or deformities.   Nose:  External nasal examination shows no deformity or inflammation. Nasal mucosa are pink and moist without lesions, exudates Oral exam:  Lips and gums are healthy appearing. There is no oropharyngeal erythema or exudate. Neck:  No thyromegaly, masses, tenderness noted.    Heart:  Normal rate and regular rhythm. S1 and S2 normal without gallop, murmur, click, rub .  Lungs: Chest clear to auscultation without wheezes, rhonchi, rales, rubs. Abdomen: Bowel sounds are normal. Abdomen is soft and nontender with no organomegaly, hernias, masses. GU: Deferred  Extremities:  No cyanosis, clubbing, edema  Neurologic exam : Cn 2-7 intact Strength equal  in upper & lower extremities Balance, Rhomberg, finger to nose testing could not be completed due to clinical state Deep tendon reflexes are equal Skin: Warm & dry w/o tenting. No significant lesions or rash.  See summary under each active problem in the Problem List with associated updated therapeutic plan

## 2023-08-25 NOTE — Assessment & Plan Note (Addendum)
 She remains on methimazole ; most recent TSH was therapeutic in February.  Slight tachycardia present today.  Update TSH in next 1 to 3 months. Serially anemia has significantly improved with current H/H of 11.1/35.4.

## 2023-08-25 NOTE — Assessment & Plan Note (Addendum)
 Serially total protein & albumin are stable to slightly improved.  Interosseous wasting and limb atrophy present on exam.  Nutritionist continues to follow at the SNF.

## 2023-08-25 NOTE — Patient Instructions (Signed)
 See assessment and plan under each diagnosis in the problem list and acutely for this visit

## 2023-09-03 DIAGNOSIS — B351 Tinea unguium: Secondary | ICD-10-CM | POA: Diagnosis not present

## 2023-09-03 DIAGNOSIS — I739 Peripheral vascular disease, unspecified: Secondary | ICD-10-CM | POA: Diagnosis not present

## 2023-09-03 DIAGNOSIS — L602 Onychogryphosis: Secondary | ICD-10-CM | POA: Diagnosis not present

## 2023-09-15 ENCOUNTER — Encounter: Payer: Self-pay | Admitting: Adult Health

## 2023-09-15 ENCOUNTER — Non-Acute Institutional Stay (SKILLED_NURSING_FACILITY): Payer: Self-pay | Admitting: Adult Health

## 2023-09-15 DIAGNOSIS — I10 Essential (primary) hypertension: Secondary | ICD-10-CM

## 2023-09-15 DIAGNOSIS — M810 Age-related osteoporosis without current pathological fracture: Secondary | ICD-10-CM

## 2023-09-15 DIAGNOSIS — F01C0 Vascular dementia, severe, without behavioral disturbance, psychotic disturbance, mood disturbance, and anxiety: Secondary | ICD-10-CM

## 2023-09-15 NOTE — Progress Notes (Signed)
 Location:  [facility] Room Number: 112 Place of Service:  SNF (31)   CODE STATUS: dnr   Allergies  Allergen Reactions   Hydrocodone -Acetaminophen  Nausea And Vomiting    Chief Complaint  Patient presents with   Medical Management of Chronic Issues         Vascular dementia without behavioral disturbance:     Essential hypertension:   Post menopausal osteoporosis:    HPI:  She is a 89 y.o. long term resident of this facility being seen for the management of her chronic illnesses: Vascular dementia without behavioral disturbance:     Essential hypertension:   Post menopausal osteoporosis. There are no reports of uncontrolled pain. She continues to slowly lose weight. Remeron is providing little relief for her weight loss. Will continue this at this time. Will need to stop this medication if she continues to lose weight.    Past Medical History:  Diagnosis Date   Anxiety    Dementia (HCC)    GERD (gastroesophageal reflux disease)    Goiter    Hypertension    Major depression, recurrent, chronic (HCC) 02/22/2019   Osteoarthritis    Osteoporosis    Vitamin D  insufficiency     Past Surgical History:  Procedure Laterality Date   APPENDECTOMY     FEMUR IM NAIL Right 04/25/2023   Procedure: INSERTION, INTRAMEDULLARY ROD, FEMUR, RETROGRADE;  Surgeon: Margrette Taft BRAVO, MD;  Location: [facility];  Service: Orthopedics;  Laterality: Right;  arhtrx nail retrograde femoral   smith nephew dhs removal may be needed; keeling placed yrs ago   HARDWARE REMOVAL Right 04/25/2023   Procedure: REMOVAL, HARDWARE, FEMUR;  Surgeon: Margrette Taft BRAVO, MD;  Location: [facility];  Service: Orthopedics;  Laterality: Right;   INTRAMEDULLARY (IM) NAIL INTERTROCHANTERIC Left 04/23/2023   Procedure: FIXATION, FRACTURE, INTERTROCHANTERIC, WITH INTRAMEDULLARY ROD;  Surgeon: Margrette Taft BRAVO, MD;  Location: [facility];  Service: Orthopedics;  Laterality: Left;   KIDNEY STONE SURGERY      ORIF HIP FRACTURE Right 11/20/2012   Procedure: OPEN REDUCTION INTERNAL FIXATION RIGHT HIP;  Surgeon: Lemond Stable, MD;  Location: [facility];  Service: Orthopedics;  Laterality: Right;   stress fractures of legs     TUBAL LIGATION      Social History   Socioeconomic History   Marital status: Widowed    Spouse name: Not on file   Number of children: Not on file   Years of education: Not on file   Highest education level: Not on file  Occupational History   Occupation: retired  Tobacco Use   Smoking status: Never   Smokeless tobacco: Never  Vaping Use   Vaping status: Never Used  Substance and Sexual Activity   Alcohol  use: No   Drug use: No   Sexual activity: Not Currently  Other Topics Concern   Not on file  Social History Narrative   Long term resident of [facility]    Social Drivers of Health   Financial Resource Strain: Not on file  Food Insecurity: Patient Declined (04/23/2023)   Hunger Vital Sign    Worried About Running Out of Food in the Last Year: Patient declined    Ran Out of Food in the Last Year: Patient declined  Transportation Needs: Patient Declined (04/23/2023)   PRAPARE - Administrator, Civil Service (Medical): Patient declined    Lack of Transportation (Non-Medical): Patient declined  Physical Activity: Not on file  Stress: Not on file  Social  Connections: Patient Declined (04/23/2023)   Social Connection and Isolation Panel    Frequency of Communication with Friends and Family: Patient declined    Frequency of Social Gatherings with Friends and Family: Patient declined    Attends Religious Services: Patient declined    Database administrator or Organizations: Patient declined    Attends Banker Meetings: Patient declined    Marital Status: Patient declined  Intimate Partner Violence: Patient Declined (04/23/2023)   Humiliation, Afraid, Rape, and Kick questionnaire    Fear of Current or Ex-Partner: Patient declined    Emotionally  Abused: Patient declined    Physically Abused: Patient declined    Sexually Abused: Patient declined   Family History  Problem Relation Age of Onset   Heart attack Father    Heart disease Neg Hx       VITAL SIGNS BP 102/61   Pulse 69   Temp (!) 97.1 F (36.2 C)   Resp 20   Ht 4' 10 (1.473 m)   Wt 101 lb 6.4 oz (46 kg)   SpO2 96%   BMI 21.19 kg/m   Outpatient Encounter Medications as of 09/15/2023  Medication Sig   acetaminophen  (TYLENOL ) 325 MG tablet Take 650 mg by mouth 3 (three) times daily.   Balsam [place]-Castor Oil (VENELEX) OINT Apply 1 Application topically.   diclofenac Sodium (VOLTAREN ARTHRITIS PAIN) 1 % GEL Apply 2 g topically 3 (three) times daily.   methimazole  (TAPAZOLE ) 5 MG tablet Take 5 mg by mouth daily.   methocarbamol  (ROBAXIN ) 500 MG tablet Take 1 tablet (500 mg total) by mouth every 6 (six) hours as needed for muscle spasms.   mirtazapine (REMERON) 7.5 MG tablet Take 7.5 mg by mouth every other day. HS   NON FORMULARY Diet:Dysphagia 3 (mechanical soft), thin liquids. OK to have soft sandwiches per LPN request.   ondansetron  (ZOFRAN ) 4 MG tablet Take 1 tablet (4 mg total) by mouth every 6 (six) hours as needed for nausea.   polyethylene glycol (MIRALAX  / GLYCOLAX ) 17 g packet Take 17 g by mouth 2 (two) times daily as needed.   sennosides-docusate sodium  (SENOKOT-S) 8.6-50 MG tablet Take 1 tablet by mouth 2 times daily at 12 noon and 4 pm.   No facility-administered encounter medications on file as of 09/15/2023.     SIGNIFICANT DIAGNOSTIC EXAMS  LABS REVIEWED PREVIOUS     12-21-22: protein 6.1 albumin 2.1 01-23-23: wbc 5.6; hgb 11.7; hct 37.3; mcv 91.9 plt 225; glucose 98; bun 14; creat 0.61; k+ 3.8; na++ 137; ca 8.7; gfr >60; protein 6.5 albumin 3.2   03-20-23: tsh 3.268 03-25-23: wbc 10.8; hgb 10.9; hct 34.4; mcv 92.5 plt 366; glucose 212; bun 27; creat 0.72; k+ 3.9; na++ 141; ca 8.9; gfr >60; tsh 2.981; free t3: 2.7 04-27-23: wbc 9.7; hgb 8.6; hct  27.8; mcv 994.6 plt 240; glucose 137; bun 18; creat 0.52; k+ 4.1; na++ 138; ca 8.1 gfr >60 06-05-23: hgb 11.1; hct 35.4 06-20-23; liver normal protein 6.5 albumin 3.0   NO NEW LABS.    Review of Systems  Unable to perform ROS: Dementia    Physical Exam Constitutional:      General: She is not in acute distress.    Appearance: She is underweight. She is not diaphoretic.  Neck:     Thyroid : Thyroid  mass and thyromegaly present.  Cardiovascular:     Rate and Rhythm: Normal rate and regular rhythm.     Heart sounds: Normal heart sounds.  Pulmonary:     Effort: Pulmonary effort is normal. No respiratory distress.     Breath sounds: Normal breath sounds.  Abdominal:     General: Bowel sounds are normal. There is no distension.     Palpations: Abdomen is soft.     Tenderness: There is no abdominal tenderness.  Musculoskeletal:     Cervical back: Neck supple.     Right lower leg: No edema.     Left lower leg: No edema.     Comments: Bilateral hip fractures    Lymphadenopathy:     Cervical: No cervical adenopathy.  Skin:    General: Skin is warm and dry.  Neurological:     Mental Status: She is alert. Mental status is at baseline.  Psychiatric:        Mood and Affect: Mood normal.      ASSESSMENT/ PLAN:  TODAY  Vascular dementia without behavioral disturbance: weight is 101 down from 104 pounds; she continues remeron every other hs.   2. Essential hypertension: b/p 102/61  3. Post menopausal osteoporosis: t score -5.527 is status post bilateral hip fractures.   PREVIOUS   4. Goiter: tsh 2.981; free t3: 2.7 is on tapazole  5 mg daily   5. Normochromic anemia: hgb 8.6 will monitor   6. Severe protein calorie malnutrition: albumin 3.0; will continue supplements as directed   7. Chronic constipation: will continue senna s twice daily   8. Angina pectoris unspecified: no reports of chest pain.   9. Primary osteoarthritis of multiple joints: will continue tylenol  650 mg  three times daily   10. Bilateral hip fractures: is status post surgical repair  11. GERD without esophagitis: has history of GIB: is off PPI      Barnie Seip NP [facility]   call [phone]

## 2023-09-17 ENCOUNTER — Non-Acute Institutional Stay (SKILLED_NURSING_FACILITY): Payer: Self-pay | Admitting: Adult Health

## 2023-09-17 ENCOUNTER — Encounter: Payer: Self-pay | Admitting: Adult Health

## 2023-09-17 DIAGNOSIS — Z0000 Encounter for general adult medical examination without abnormal findings: Secondary | ICD-10-CM

## 2023-09-17 NOTE — Progress Notes (Signed)
 Subjective:   [name] is a 89 y.o. female who presents for Medicare Annual (Subsequent) preventive examination.  Visit Complete: In person  Patient Medicare AWV questionnaire was completed by the patient on [id number]; I have confirmed that all information answered by patient is correct and no changes since this date.  Cardiac Risk Factors include: advanced age (>47men, >59 women);sedentary lifestyle     Objective:    Today's Vitals   09/17/23 0920  BP: 102/61  Pulse: 69  Resp: 20  Temp: (!) 97.1 F (36.2 C)  SpO2: 95%  Weight: 101 lb 6.4 oz (46 kg)  Height: 4' 10 (1.473 m)   Body mass index is 21.19 kg/m.     08/07/2023   11:40 AM 07/30/2023   11:08 AM 06/25/2023   11:05 AM 04/25/2023   11:30 AM 04/23/2023    2:39 PM 04/22/2023    2:47 PM 10/25/2022    8:16 AM  Advanced Directives  Does Patient Have a Medical Advance Directive? Yes Yes Yes Yes Yes Yes   Type of Estate agent of [place];Out of facility DNR (pink MOST or yellow form);Living will Healthcare Power of [place];Out of facility DNR (pink MOST or yellow form);Living will Healthcare Power of [place];Out of facility DNR (pink MOST or yellow form);Living will Out of facility DNR (pink MOST or yellow form) Out of facility DNR (pink MOST or yellow form) Out of facility DNR (pink MOST or yellow form) Healthcare Power of [place];Out of facility DNR (pink MOST or yellow form);Living will  Does patient want to make changes to medical advance directive? No - Patient declined No - Patient declined No - Patient declined  No - Guardian declined  Yes (Inpatient - patient defers changing a medical advance directive at this time - Information given)  Copy of Healthcare Power of Attorney in Chart? Yes - validated most recent copy scanned in chart (See row information) Yes - validated most recent copy scanned in chart (See row information) Yes - validated most recent copy scanned in chart (See row information)     Yes - validated most recent copy scanned in chart (See row information)  Pre-existing out of facility DNR order (yellow form or pink MOST form)      Pink Most/Yellow Form available - Physician notified to receive inpatient order     Current Medications (verified) Outpatient Encounter Medications as of 09/17/2023  Medication Sig   acetaminophen  (TYLENOL ) 325 MG tablet Take 650 mg by mouth 3 (three) times daily.   Balsam [place]-Castor Oil (VENELEX) OINT Apply 1 Application topically.   diclofenac Sodium (VOLTAREN ARTHRITIS PAIN) 1 % GEL Apply 2 g topically 3 (three) times daily.   methimazole  (TAPAZOLE ) 5 MG tablet Take 5 mg by mouth daily.   methocarbamol  (ROBAXIN ) 500 MG tablet Take 1 tablet (500 mg total) by mouth every 6 (six) hours as needed for muscle spasms.   mirtazapine (REMERON) 7.5 MG tablet Take 7.5 mg by mouth every other day. HS   NON FORMULARY Diet:Dysphagia 3 (mechanical soft), thin liquids. OK to have soft sandwiches per LPN request.   ondansetron  (ZOFRAN ) 4 MG tablet Take 1 tablet (4 mg total) by mouth every 6 (six) hours as needed for nausea.   OXYGEN  Inhale 2 L into the lungs continuous.  May titrate O2 to 3L/mn prn to maintain sats >89%. Special Instructions: Document O2 sat qshift. [DX: Chronic respiratory failure with hypoxia] Every Shift; Day, Evening, Night   polyethylene glycol (MIRALAX  / GLYCOLAX ) 17  g packet Take 17 g by mouth 2 (two) times daily as needed.   sennosides-docusate sodium  (SENOKOT-S) 8.6-50 MG tablet Take 1 tablet by mouth 2 times daily at 12 noon and 4 pm.   No facility-administered encounter medications on file as of 09/17/2023.    Allergies (verified) Hydrocodone -acetaminophen    History: Past Medical History:  Diagnosis Date   Anxiety    Dementia (HCC)    GERD (gastroesophageal reflux disease)    Goiter    Hypertension    Major depression, recurrent, chronic (HCC) 02/22/2019   Osteoarthritis    Osteoporosis    Vitamin D  insufficiency     Past Surgical History:  Procedure Laterality Date   APPENDECTOMY     FEMUR IM NAIL Right 04/25/2023   Procedure: INSERTION, INTRAMEDULLARY ROD, FEMUR, RETROGRADE;  Surgeon: Margrette Taft BRAVO, MD;  Location: [facility];  Service: Orthopedics;  Laterality: Right;  arhtrx nail retrograde femoral   smith nephew dhs removal may be needed; keeling placed yrs ago   HARDWARE REMOVAL Right 04/25/2023   Procedure: REMOVAL, HARDWARE, FEMUR;  Surgeon: Margrette Taft BRAVO, MD;  Location: [facility];  Service: Orthopedics;  Laterality: Right;   INTRAMEDULLARY (IM) NAIL INTERTROCHANTERIC Left 04/23/2023   Procedure: FIXATION, FRACTURE, INTERTROCHANTERIC, WITH INTRAMEDULLARY ROD;  Surgeon: Margrette Taft BRAVO, MD;  Location: [facility];  Service: Orthopedics;  Laterality: Left;   KIDNEY STONE SURGERY     ORIF HIP FRACTURE Right 11/20/2012   Procedure: OPEN REDUCTION INTERNAL FIXATION RIGHT HIP;  Surgeon: Lemond Stable, MD;  Location: [facility];  Service: Orthopedics;  Laterality: Right;   stress fractures of legs     TUBAL LIGATION     Family History  Problem Relation Age of Onset   Heart attack Father    Heart disease Neg Hx    Social History   Socioeconomic History   Marital status: Widowed    Spouse name: Not on file   Number of children: Not on file   Years of education: Not on file   Highest education level: Not on file  Occupational History   Occupation: retired  Tobacco Use   Smoking status: Never   Smokeless tobacco: Never  Vaping Use   Vaping status: Never Used  Substance and Sexual Activity   Alcohol  use: No   Drug use: No   Sexual activity: Not Currently  Other Topics Concern   Not on file  Social History Narrative   [facility] resident of [facility]    Social Drivers of Health   Financial Resource Strain: Not on file  Food Insecurity: Patient Declined (04/23/2023)   Hunger Vital Sign    Worried About Running Out of Food in the Last Year: Patient declined    Ran Out of Food in the Last  Year: Patient declined  Transportation Needs: Patient Declined (04/23/2023)   PRAPARE - Administrator, Civil Service (Medical): Patient declined    Lack of Transportation (Non-Medical): Patient declined  Physical Activity: Not on file  Stress: Not on file  Social Connections: Patient Declined (04/23/2023)   Social Connection and Isolation Panel    Frequency of Communication with Friends and Family: Patient declined    Frequency of Social Gatherings with Friends and Family: Patient declined    Attends Religious Services: Patient declined    Database administrator or Organizations: Patient declined    Attends Banker Meetings: Patient declined    Marital Status: Patient declined    Tobacco Counseling Counseling given: Not Answered  Clinical Intake:  Pre-visit preparation completed: Yes  Pain : Faces Faces Pain Scale: No hurt  Faces Pain Scale: No hurt  BMI - recorded: 21.19 Nutritional Status: BMI of 19-24  Normal Nutritional Risks: Unintentional weight loss, Failure to thrive Diabetes: No  How often do you need to have someone help you when you read instructions, pamphlets, or other written materials from your doctor or pharmacy?: 5 - Always  Interpreter Needed?: No  Comments: long term resident of [facility]   Activities of Daily Living    09/17/2023    1:37 PM  In your present state of health, do you have any difficulty performing the following activities:  Hearing? 0  Vision? 0  Difficulty concentrating or making decisions? 1  Walking or climbing stairs? 1  Dressing or bathing? 1  Doing errands, shopping? 1  Preparing Food and eating ? Y  Using the Toilet? Y  In the past six months, have you accidently leaked urine? Y  Do you have problems with loss of bowel control? Y  Managing your Medications? Y  Housekeeping or managing your Housekeeping? Y    Patient Care Team: Landy Barnie RAMAN, NP as PCP - General (Geriatric Medicine) Charls Pearla LABOR, MD (Inactive) as PCP - Cardiology (Cardiology) [facility]  Indicate any recent Medical Services you may have received from other than [facility] providers in the past year (date may be approximate).     Assessment:   This is a routine wellness examination for Leen.  Hearing/Vision screen No results found.   Goals Addressed   None    Depression Screen    09/17/2023    1:34 PM 03/18/2023   11:14 AM 01/22/2023   11:36 AM 09/04/2022   10:25 AM 03/13/2022    9:51 AM 08/27/2021    3:42 PM 07/09/2021    1:25 PM  PHQ 2/9 Scores  PHQ - 2 Score   0 0 0    Exception Documentation Other- indicate reason in comment box Other- indicate reason in comment box     Other- indicate reason in comment box  Not completed unable to participate unable to fully participate    unable to participate unable to participate    Fall Risk    09/17/2023    1:34 PM 03/18/2023   11:14 AM 01/22/2023   11:36 AM 09/04/2022   10:25 AM 03/12/2022   10:27 AM  Fall Risk   Falls in the past year? 1 0 0 0 0  Number falls in past yr: 1 0 0 0 0  Injury with Fall? 1 0 0 0 0  Risk for fall due to : History of fall(s);Impaired balance/gait;Impaired mobility Impaired balance/gait;Impaired mobility Impaired balance/gait;Impaired mobility Impaired balance/gait;Impaired mobility No Fall Risks  Follow up   Falls evaluation completed  Falls evaluation completed    MEDICARE RISK AT HOME: Medicare Risk at Home Any stairs in or around the home?: Yes If so, are there any without handrails?: No Home free of loose throw rugs in walkways, pet beds, electrical cords, etc?: Yes Adequate lighting in your home to reduce risk of falls?: Yes Life alert?: No Use of a cane, walker or w/c?: Yes Grab bars in the bathroom?: Yes Shower chair or bench in shower?: Yes Elevated toilet seat or a handicapped toilet?: Yes  TIMED UP AND GO:  Was the test performed?  No    Cognitive Function:     09/17/2023    1:37 PM 09/04/2022  10:26 AM 08/27/2021    3:44 PM 08/22/2020    9:52 AM  MMSE - Mini Mental State Exam  Not completed: Unable to complete Unable to complete Unable to complete Unable to complete        08/22/2020    9:52 AM  6CIT Screen  What Year? --    Immunizations Immunization History  Administered Date(s) Administered   Fluad Quad(high Dose 65+) 12/02/2022   Fluad Trivalent(High Dose 65+) 11/12/2022, 11/25/2022   Influenza,inj,Quad PF,6+ Mos 11/08/2020   Influenza-Unspecified 11/26/2018, 11/12/2019, 11/06/2021   MMR 04/06/2015   Moderna Covid-19 Fall Seasonal Vaccine 82yrs & older 05/06/2023   Moderna Covid-19 Vaccine  Bivalent Booster 69yrs & up 11/28/2020   Moderna SARS-COV2 Booster Vaccination 05/17/2020, 06/26/2021   Moderna Sars-Covid-2 Vaccination 05/06/2019, 06/02/2019, 12/09/2019, 11/28/2021, 11/12/2022   PNEUMOCOCCAL CONJUGATE-20 07/20/2021   Pneumococcal Conjugate-13 04/06/2015   Pneumococcal Polysaccharide-23 11/16/2001   RSV,unspecified 01/07/2022   Td 03/08/2011   Tdap 03/08/2011, 04/12/2021   Zoster Recombinant(Shingrix) 10/03/2020, 01/05/2021    TDAP status: Up to date  Flu Vaccine status: Up to date  Pneumococcal vaccine status: Up to date  Covid-19 vaccine status: Completed vaccines  Qualifies for Shingles Vaccine? No   Zostavax completed Yes   Shingrix Completed?: Yes  Screening Tests Health Maintenance  Topic Date Due   Medicare Annual Wellness (AWV)  09/04/2023   INFLUENZA VACCINE  09/05/2023   COVID-19 Vaccine (10 - 2024-25 season) 11/05/2023   DTaP/Tdap/Td (4 - Td or Tdap) 04/13/2031   Pneumococcal Vaccine: 50+ Years  Completed   DEXA SCAN  Completed   Zoster Vaccines- Shingrix  Completed   Hepatitis B Vaccines  Aged Out   HPV VACCINES  Aged Out   Meningococcal B Vaccine  Aged Out    Health Maintenance  Health Maintenance Due  Topic Date Due   Medicare Annual Wellness (AWV)  09/04/2023   INFLUENZA VACCINE   09/05/2023    Colorectal cancer screening: No longer required.   Mammogram status: No longer required due to age.  Bone Density status: Completed 2023. Results reflect: Bone density results: OSTEOPOROSIS. Repeat every   years.  Lung Cancer Screening: (Low Dose CT Chest recommended if Age 48-80 years, 20 pack-year currently smoking OR have quit w/in 15years.) does not qualify.   Lung Cancer Screening Referral:   Additional Screening:  Hepatitis C Screening: does not qualify; Completed   Vision Screening: Recommended annual ophthalmology exams for early detection of glaucoma and other disorders of the eye. Is the patient up to date with their annual eye exam?  No  Who is the provider or what is the name of the office in which the patient attends annual eye exams?  If pt is not established with a provider, would they like to be referred to a provider to establish care? No .   Dental Screening: Recommended annual dental exams for proper oral hygiene  Diabetic Foot Exam:   Community Resource Referral / Chronic Care Management: CRR required this visit?  No   CCM required this visit?  No     Plan:     I have personally reviewed and noted the following in the patient's chart:   Medical and social history Use of alcohol , tobacco or illicit drugs  Current medications and supplements including opioid prescriptions. Patient is not currently taking opioid prescriptions. Functional ability and status Nutritional status Physical activity Advanced directives List of other physicians Hospitalizations, surgeries, and ER visits in previous 12 months Vitals Screenings to include cognitive, depression,  and falls Referrals and appointments  In addition, I have reviewed and discussed with patient certain preventive protocols, quality metrics, and best practice recommendations. A written personalized care plan for preventive services as well as general preventive health recommendations  were provided to patient.     Barnie GORMAN Seip, NP   09/17/2023   After Visit Summary: (In Person-Declined) Patient declined AVS at this time.  Nurse Notes: this exam was performed by myself at this facility

## 2023-09-23 ENCOUNTER — Encounter: Payer: Self-pay | Admitting: Internal Medicine

## 2023-09-23 ENCOUNTER — Non-Acute Institutional Stay (SKILLED_NURSING_FACILITY): Payer: Self-pay | Admitting: Internal Medicine

## 2023-09-23 DIAGNOSIS — E43 Unspecified severe protein-calorie malnutrition: Secondary | ICD-10-CM

## 2023-09-23 DIAGNOSIS — L538 Other specified erythematous conditions: Secondary | ICD-10-CM | POA: Diagnosis not present

## 2023-09-23 DIAGNOSIS — R627 Adult failure to thrive: Secondary | ICD-10-CM

## 2023-09-23 DIAGNOSIS — F01C0 Vascular dementia, severe, without behavioral disturbance, psychotic disturbance, mood disturbance, and anxiety: Secondary | ICD-10-CM

## 2023-09-23 NOTE — Patient Instructions (Signed)
 See assessment and plan under each diagnosis in the problem list and acutely for this visit

## 2023-09-23 NOTE — Assessment & Plan Note (Signed)
 Labs will be performed to see if the methimazole  should be discontinued.  These will include ANCA, sed rate, CMET, TSH,CBC and differential.

## 2023-09-23 NOTE — Assessment & Plan Note (Signed)
 Staff reports that oral intake is extremely poor and she will consume less than 25% of fluids as well.  This is associated with low albumin and low normal total protein.  She also has interosseous wasting and limb atrophy.

## 2023-09-23 NOTE — Assessment & Plan Note (Signed)
 Her speech is garbled and unintelligible.  She has difficulty following commands.  MMSE update is not possible.

## 2023-09-23 NOTE — Progress Notes (Unsigned)
 NURSING HOME LOCATION:  [facility] ROOM NUMBER:  [id number] W  CODE STATUS:  DNR  PCP: Landy Barnie RAMAN, NP   This is a nursing facility follow up visit for specific acute issue of color change of palms..  Interim medical record and care since last SNF visit was updated with review of diagnostic studies and change in clinical status since last visit were documented.  HPI: Her Nurse informed me that  the residents daughter was concerned about color change of her palms, specifically reddish discoloration.  No other signs or symptoms were reported. She is a permanent resident of the facility with medical diagnoses of history of nephrolithiasis; vitamin D  deficiency; osteoporosis; OA; essential hypertension; goiter; GERD; and dementia. Most recent labs were performed 5/16.  Albumin was reduced at 3.0 and total protein was low normal at 6.5.  Imaging of the right femur and hip on 6/26 revealed complete healing of femur and left peritrochanteric fractures. On 5/1 there have been significant improvement in her normochromic, normocytic anemia with H/H of 11.1/35.4, up from prior values of 8.3/26.2. She is on methimazole  for her thyroid  disorder with associated goiter.A review of this drug in Up to Date documents that this agent can cause skin hyperpigmentation.  Other significant adverse reactions include agranulocytosis;, hepatotoxicity, lupus-like syndrome, pancreatitis, and vasculitis.  Review of systems could not be completed as her speech is garbled, very weak, and unintelligible.  Staff reports she is deteriorating, consuming less than 25% of the fluids off of her and eating very poorly if at all.  Despite the anorexia and poor nutritional status; decubitus on the buttocks is improving and almost resolved as per wound care nurse.  Physical exam:  Pertinent or positive findings: She appears her age and suboptimally nourished.  Hair is disheveled.  As noted the speech is  unintelligible.  She exhibits a wide-eyed yet blank facies.  She is missing multiple upper and lower teeth.  Large goiter is visible over the anterior neck.  Heart sounds are markedly distant.  The pulse with carotid pulse must be palpated to help identify the rate and rhythm which appears normal.  Breath sounds are markedly decreased.  Limb atrophy and interosseous wasting is present.  Posterior tibial pulses are stronger than dorsalis pedis pulses.  She has arthritic changes of the toes and foot overlap.  Toenails are deformed in isolated fashion.  She exhibits a to and fro tremor of the right hand.  The left hand tends to be fisted but the fingers can be passively extended .  This seems to cause some discomfort.  She also exhibited a slight tremor of the left foot. There appears a faint, bland erythema of the palms but not the soles.  This spares the central portion of the palms in a vertical distribution.  These areas do not blanch and there is no clinical evidence to suggest cellulitis.  There is no associated lymphadenopathy noted.  General appearance: Adequately nourished; no acute distress, increased work of breathing is present.   Lymphatic: No lymphadenopathy about the head, neck, axilla. Eyes: No conjunctival inflammation or lid edema is present. There is no scleral icterus. Ears:  External ear exam shows no significant lesions or deformities.   Nose:  External nasal examination shows no deformity or inflammation. Nasal mucosa are pink and moist without lesions, exudates Oral exam:  Lips and gums are healthy appearing. There is no oropharyngeal erythema or exudate. Neck:  No thyromegaly, masses, tenderness noted.  Heart:  Normal rate and regular rhythm. S1 and S2 normal without gallop, murmur, click, rub .  Lungs: Chest clear to auscultation without wheezes, rhonchi, rales, rubs. Abdomen: Bowel sounds are normal. Abdomen is soft and nontender with no organomegaly, hernias, masses. GU:  Deferred  Extremities:  No cyanosis, clubbing, edema  Neurologic exam : Cn 2-7 intact Strength equal  in upper & lower extremities Balance, Rhomberg, finger to nose testing could not be completed due to clinical state Deep tendon reflexes are equal Skin: Warm & dry w/o tenting. No significant lesions or rash.  See summary under each active problem in the Problem List with associated updated therapeutic plan

## 2023-09-23 NOTE — Assessment & Plan Note (Signed)
 Despite low normal total protein of 6.5; clinically she has severe malnutrition.  Oral intake is extremely poor to include even fluid intake.

## 2023-09-24 ENCOUNTER — Inpatient Hospital Stay: Admission: RE | Admit: 2023-09-24 | Source: Skilled Nursing Facility

## 2023-09-24 ENCOUNTER — Other Ambulatory Visit (HOSPITAL_COMMUNITY)
Admission: RE | Admit: 2023-09-24 | Discharge: 2023-09-24 | Disposition: A | Discharge status: Home / Self Care | Source: Skilled Nursing Facility | Attending: Internal Medicine | Admitting: Internal Medicine

## 2023-09-24 DIAGNOSIS — D702 Other drug-induced agranulocytosis: Secondary | ICD-10-CM | POA: Insufficient documentation

## 2023-09-24 DIAGNOSIS — L959 Vasculitis limited to the skin, unspecified: Secondary | ICD-10-CM | POA: Insufficient documentation

## 2023-09-24 LAB — CBC WITH DIFFERENTIAL/PLATELET
Abs Immature Granulocytes: 0.03 K/uL (ref 0.00–0.07)
Basophils Absolute: 0 K/uL (ref 0.0–0.1)
Basophils Relative: 0 %
Eosinophils Absolute: 0.1 K/uL (ref 0.0–0.5)
Eosinophils Relative: 1 %
HCT: 40.5 % (ref 36.0–46.0)
Hemoglobin: 12.6 g/dL (ref 12.0–15.0)
Immature Granulocytes: 0 %
Lymphocytes Relative: 20 %
Lymphs Abs: 1.7 K/uL (ref 0.7–4.0)
MCH: 29.5 pg (ref 26.0–34.0)
MCHC: 31.1 g/dL (ref 30.0–36.0)
MCV: 94.8 fL (ref 80.0–100.0)
Monocytes Absolute: 0.7 K/uL (ref 0.1–1.0)
Monocytes Relative: 9 %
Neutro Abs: 5.9 K/uL (ref 1.7–7.7)
Neutrophils Relative %: 70 %
Platelets: 298 K/uL (ref 150–400)
RBC: 4.27 MIL/uL (ref 3.87–5.11)
RDW: 17.1 % — ABNORMAL HIGH (ref 11.5–15.5)
WBC: 8.5 K/uL (ref 4.0–10.5)
nRBC: 0 % (ref 0.0–0.2)

## 2023-09-24 LAB — COMPREHENSIVE METABOLIC PANEL WITH GFR
ALT: 30 U/L (ref 0–44)
AST: 24 U/L (ref 15–41)
Albumin: 2.9 g/dL — ABNORMAL LOW (ref 3.5–5.0)
Alkaline Phosphatase: 56 U/L (ref 38–126)
Anion gap: 15 (ref 5–15)
BUN: 25 mg/dL — ABNORMAL HIGH (ref 8–23)
CO2: 25 mmol/L (ref 22–32)
Calcium: 9.2 mg/dL (ref 8.9–10.3)
Chloride: 107 mmol/L (ref 98–111)
Creatinine, Ser: 0.64 mg/dL (ref 0.44–1.00)
GFR, Estimated: >60 mL/min (ref >60)
Glucose, Bld: 117 mg/dL — ABNORMAL HIGH (ref 70–99)
Potassium: 3.6 mmol/L (ref 3.5–5.1)
Sodium: 147 mmol/L — ABNORMAL HIGH (ref 135–145)
Total Bilirubin: 0.6 mg/dL (ref 0.0–1.2)
Total Protein: 6.9 g/dL (ref 6.5–8.1)

## 2023-09-24 LAB — TSH: TSH: 2.267 u[IU]/mL (ref 0.350–4.500)

## 2023-09-24 LAB — SEDIMENTATION RATE: Sed Rate: 43 mm/h — ABNORMAL HIGH (ref 0–30)

## 2023-09-25 LAB — ANA: Anti Nuclear Antibody (ANA): NEGATIVE

## 2023-09-25 LAB — ANCA TITERS
Atypical P-ANCA titer: <1:20 {titer}
C-ANCA: <1:20 {titer}
P-ANCA: <1:20 {titer}

## 2023-10-15 ENCOUNTER — Non-Acute Institutional Stay (SKILLED_NURSING_FACILITY): Payer: Self-pay | Admitting: Adult Health

## 2023-10-15 DIAGNOSIS — D649 Anemia, unspecified: Secondary | ICD-10-CM

## 2023-10-15 DIAGNOSIS — E049 Nontoxic goiter, unspecified: Secondary | ICD-10-CM | POA: Diagnosis not present

## 2023-10-15 DIAGNOSIS — E43 Unspecified severe protein-calorie malnutrition: Secondary | ICD-10-CM | POA: Diagnosis not present

## 2023-10-22 ENCOUNTER — Encounter: Payer: Self-pay | Admitting: Adult Health

## 2023-10-22 NOTE — Progress Notes (Signed)
 Location:  [facility] Room Number: 110 Place of Service:  SNF (31)   CODE STATUS: dnr   Allergies  Allergen Reactions   Hydrocodone -Acetaminophen  Nausea And Vomiting    Chief Complaint  Patient presents with   Medical Management of Chronic Issues           Goiter:     Normochromic anemia: Severe protein calorie malnutrition    HPI:  She is a 89 y.o. long term resident of this facility being seen for the management of her chronic illnesses:Goiter:     Normochromic anemia: Severe protein calorie malnutrition. There are no reports of uncontrolled pain. She remains on remeron every other night her weight has been stable this past month.    Past Medical History:  Diagnosis Date   Anxiety    Dementia (HCC)    GERD (gastroesophageal reflux disease)    Goiter    Hypertension    Major depression, recurrent, chronic (HCC) 02/22/2019   Osteoarthritis    Osteoporosis    Vitamin D  insufficiency     Past Surgical History:  Procedure Laterality Date   APPENDECTOMY     FEMUR IM NAIL Right 04/25/2023   Procedure: INSERTION, INTRAMEDULLARY ROD, FEMUR, RETROGRADE;  Surgeon: Margrette Taft BRAVO, MD;  Location: [facility];  Service: Orthopedics;  Laterality: Right;  arhtrx nail retrograde femoral   smith nephew dhs removal may be needed; keeling placed yrs ago   HARDWARE REMOVAL Right 04/25/2023   Procedure: REMOVAL, HARDWARE, FEMUR;  Surgeon: Margrette Taft BRAVO, MD;  Location: [facility];  Service: Orthopedics;  Laterality: Right;   INTRAMEDULLARY (IM) NAIL INTERTROCHANTERIC Left 04/23/2023   Procedure: FIXATION, FRACTURE, INTERTROCHANTERIC, WITH INTRAMEDULLARY ROD;  Surgeon: Margrette Taft BRAVO, MD;  Location: [facility];  Service: Orthopedics;  Laterality: Left;   KIDNEY STONE SURGERY     ORIF HIP FRACTURE Right 11/20/2012   Procedure: OPEN REDUCTION INTERNAL FIXATION RIGHT HIP;  Surgeon: Lemond Stable, MD;  Location: [facility];  Service: Orthopedics;  Laterality: Right;    stress fractures of legs     TUBAL LIGATION      Social History   Socioeconomic History   Marital status: Widowed    Spouse name: Not on file   Number of children: Not on file   Years of education: Not on file   Highest education level: Not on file  Occupational History   Occupation: retired  Tobacco Use   Smoking status: Never   Smokeless tobacco: Never  Vaping Use   Vaping status: Never Used  Substance and Sexual Activity   Alcohol  use: No   Drug use: No   Sexual activity: Not Currently  Other Topics Concern   Not on file  Social History Narrative   Long term resident of [facility]    Social Drivers of Health   Financial Resource Strain: Not on file  Food Insecurity: Patient Declined (04/23/2023)   Hunger Vital Sign    Worried About Running Out of Food in the Last Year: Patient declined    Ran Out of Food in the Last Year: Patient declined  Transportation Needs: Patient Declined (04/23/2023)   PRAPARE - Administrator, Civil Service (Medical): Patient declined    Lack of Transportation (Non-Medical): Patient declined  Physical Activity: Not on file  Stress: Not on file  Social Connections: Patient Declined (04/23/2023)   Social Connection and Isolation Panel    Frequency of Communication with Friends and Family: Patient declined    Frequency  of Social Gatherings with Friends and Family: Patient declined    Attends Religious Services: Patient declined    Database administrator or Organizations: Patient declined    Attends Banker Meetings: Patient declined    Marital Status: Patient declined  Intimate Partner Violence: Patient Declined (04/23/2023)   Humiliation, Afraid, Rape, and Kick questionnaire    Fear of Current or Ex-Partner: Patient declined    Emotionally Abused: Patient declined    Physically Abused: Patient declined    Sexually Abused: Patient declined   Family History  Problem Relation Age of Onset   Heart attack Father    Heart  disease Neg Hx       VITAL SIGNS BP 130/76   Pulse 76   Temp (!) 97 F (36.1 C)   Resp 18   Ht 4' 10 (1.473 m)   Wt 102 lb (46.3 kg)   SpO2 93%   BMI 21.32 kg/m   Outpatient Encounter Medications as of 10/15/2023  Medication Sig   acetaminophen  (TYLENOL ) 325 MG tablet Take 650 mg by mouth 3 (three) times daily.   Balsam [place]-Castor Oil (VENELEX) OINT Apply 1 Application topically.   diclofenac Sodium (VOLTAREN ARTHRITIS PAIN) 1 % GEL Apply 2 g topically 3 (three) times daily.   methimazole  (TAPAZOLE ) 5 MG tablet Take 5 mg by mouth daily.   methocarbamol  (ROBAXIN ) 500 MG tablet Take 1 tablet (500 mg total) by mouth every 6 (six) hours as needed for muscle spasms.   mirtazapine (REMERON) 7.5 MG tablet Take 7.5 mg by mouth every other day. HS   NON FORMULARY Diet:Dysphagia 3 (mechanical soft), thin liquids. OK to have soft sandwiches per LPN request.   ondansetron  (ZOFRAN ) 4 MG tablet Take 1 tablet (4 mg total) by mouth every 6 (six) hours as needed for nausea.   OXYGEN  Inhale 2 L into the lungs continuous.  May titrate O2 to 3L/mn prn to maintain sats >89%. Special Instructions: Document O2 sat qshift. [DX: Chronic respiratory failure with hypoxia] Every Shift; Day, Evening, Night   polyethylene glycol (MIRALAX  / GLYCOLAX ) 17 g packet Take 17 g by mouth 2 (two) times daily as needed.   sennosides-docusate sodium  (SENOKOT-S) 8.6-50 MG tablet Take 1 tablet by mouth 2 times daily at 12 noon and 4 pm.   No facility-administered encounter medications on file as of 10/15/2023.     SIGNIFICANT DIAGNOSTIC EXAMS  LABS REVIEWED PREVIOUS     12-21-22: protein 6.1 albumin 2.1 01-23-23: wbc 5.6; hgb 11.7; hct 37.3; mcv 91.9 plt 225; glucose 98; bun 14; creat 0.61; k+ 3.8; na++ 137; ca 8.7; gfr >60; protein 6.5 albumin 3.2   03-20-23: tsh 3.268 03-25-23: wbc 10.8; hgb 10.9; hct 34.4; mcv 92.5 plt 366; glucose 212; bun 27; creat 0.72; k+ 3.9; na++ 141; ca 8.9; gfr >60; tsh 2.981; free t3:  2.7 04-27-23: wbc 9.7; hgb 8.6; hct 27.8; mcv 994.6 plt 240; glucose 137; bun 18; creat 0.52; k+ 4.1; na++ 138; ca 8.1 gfr >60 06-05-23: hgb 11.1; hct 35.4 06-20-23; liver normal protein 6.5 albumin 3.0   NO NEW LABS.    Review of Systems  Unable to perform ROS: Dementia     Physical Exam Constitutional:      General: She is not in acute distress.    Appearance: She is underweight. She is not diaphoretic.  Neck:     Thyroid : Thyroid  mass and thyromegaly present.  Cardiovascular:     Rate and Rhythm: Normal rate and regular rhythm.  Heart sounds: Normal heart sounds.  Pulmonary:     Effort: Pulmonary effort is normal. No respiratory distress.     Breath sounds: Normal breath sounds.  Abdominal:     General: Bowel sounds are normal. There is no distension.     Palpations: Abdomen is soft.     Tenderness: There is no abdominal tenderness.  Musculoskeletal:        General: Normal range of motion.     Cervical back: Neck supple.     Right lower leg: No edema.     Left lower leg: No edema.  Lymphadenopathy:     Cervical: No cervical adenopathy.  Skin:    General: Skin is warm and dry.  Neurological:     Mental Status: She is alert. Mental status is at baseline.  Psychiatric:        Mood and Affect: Mood normal.      ASSESSMENT/ PLAN:  TODAY  Goiter: tsh 2.981 is on tapazole  5 mg daily   2. Normochromic anemia: hgb 8.6  3. Severe protein calorie malnutrition: albumin 3.0; will continue supplements as directed.   PREVIOUS   4. Chronic constipation: will continue senna s twice daily   5. Angina pectoris unspecified: no reports of chest pain.   6. Primary osteoarthritis of multiple joints: will continue tylenol  650 mg three times daily   7. Bilateral hip fractures: is status post surgical repair  8. GERD without esophagitis: has history of GIB: is off PPI   9. Vascular dementia without behavioral disturbance: weight is 102 stable at this time; she continues  remeron every other hs.   10. Essential hypertension: b/p 130/76  11. Post menopausal osteoporosis: t score -5.527 is status post bilateral hip fractures.     Kendra Seip NP [facility]  call [phone]

## 2023-10-31 ENCOUNTER — Non-Acute Institutional Stay (SKILLED_NURSING_FACILITY): Payer: Self-pay | Admitting: Adult Health

## 2023-10-31 ENCOUNTER — Encounter: Payer: Self-pay | Admitting: Adult Health

## 2023-10-31 DIAGNOSIS — R627 Adult failure to thrive: Secondary | ICD-10-CM | POA: Diagnosis not present

## 2023-10-31 DIAGNOSIS — I700 Atherosclerosis of aorta: Secondary | ICD-10-CM

## 2023-10-31 DIAGNOSIS — J9611 Chronic respiratory failure with hypoxia: Secondary | ICD-10-CM | POA: Diagnosis not present

## 2023-10-31 NOTE — Progress Notes (Signed)
 Location:  [facility] Room Number: 114-W Place of Service:  SNF (31)   CODE STATUS: DNR  Allergies  Allergen Reactions   Hydrocodone -Acetaminophen  Nausea And Vomiting    Chief Complaint  Patient presents with   Care Plan Meeting     HPI:  We have come together for her care plan meeting .BIMS no mood 0/30. She is using a wheelchair without falls. She requires dependent assist with her adl care. She is incontinent of bladder and bowel. Dietary: dependent for meals; puree thin liquids 26-75% of meals weight is 102 pounds. Therapy: none at this time. Activities: brief activities iv outdoors, music. She will continue to be followed for her chronic illnesses including: Aortic atherosclerosis   Chronic respiratory failure with hypoxia    Failure to thrive in adult  Past Medical History:  Diagnosis Date   Anxiety    Dementia (HCC)    GERD (gastroesophageal reflux disease)    Goiter    Hypertension    Major depression, recurrent, chronic 02/22/2019   Osteoarthritis    Osteoporosis    Vitamin D  insufficiency     Past Surgical History:  Procedure Laterality Date   APPENDECTOMY     FEMUR IM NAIL Right 04/25/2023   Procedure: INSERTION, INTRAMEDULLARY ROD, FEMUR, RETROGRADE;  Surgeon: Margrette Taft BRAVO, MD;  Location: [facility];  Service: Orthopedics;  Laterality: Right;  arhtrx nail retrograde femoral   smith nephew dhs removal may be needed; keeling placed yrs ago   HARDWARE REMOVAL Right 04/25/2023   Procedure: REMOVAL, HARDWARE, FEMUR;  Surgeon: Margrette Taft BRAVO, MD;  Location: [facility];  Service: Orthopedics;  Laterality: Right;   INTRAMEDULLARY (IM) NAIL INTERTROCHANTERIC Left 04/23/2023   Procedure: FIXATION, FRACTURE, INTERTROCHANTERIC, WITH INTRAMEDULLARY ROD;  Surgeon: Margrette Taft BRAVO, MD;  Location: [facility];  Service: Orthopedics;  Laterality: Left;   KIDNEY STONE SURGERY     ORIF HIP FRACTURE Right 11/20/2012   Procedure: OPEN REDUCTION INTERNAL  FIXATION RIGHT HIP;  Surgeon: Lemond Stable, MD;  Location: [facility];  Service: Orthopedics;  Laterality: Right;   stress fractures of legs     TUBAL LIGATION      Social History   Socioeconomic History   Marital status: Widowed    Spouse name: Not on file   Number of children: Not on file   Years of education: Not on file   Highest education level: Not on file  Occupational History   Occupation: retired  Tobacco Use   Smoking status: Never   Smokeless tobacco: Never  Vaping Use   Vaping status: Never Used  Substance and Sexual Activity   Alcohol  use: No   Drug use: No   Sexual activity: Not Currently  Other Topics Concern   Not on file  Social History Narrative   Long term resident of [facility]    Social Drivers of Health   Financial Resource Strain: Not on file  Food Insecurity: Patient Declined (04/23/2023)   Hunger Vital Sign    Worried About Running Out of Food in the Last Year: Patient declined    Ran Out of Food in the Last Year: Patient declined  Transportation Needs: Patient Declined (04/23/2023)   PRAPARE - Administrator, Civil Service (Medical): Patient declined    Lack of Transportation (Non-Medical): Patient declined  Physical Activity: Not on file  Stress: Not on file  Social Connections: Patient Declined (04/23/2023)   Social Connection and Isolation Panel    Frequency of Communication with  Friends and Family: Patient declined    Frequency of Social Gatherings with Friends and Family: Patient declined    Attends Religious Services: Patient declined    Database administrator or Organizations: Patient declined    Attends Banker Meetings: Patient declined    Marital Status: Patient declined  Intimate Partner Violence: Patient Declined (04/23/2023)   Humiliation, Afraid, Rape, and Kick questionnaire    Fear of Current or Ex-Partner: Patient declined    Emotionally Abused: Patient declined    Physically Abused: Patient declined     Sexually Abused: Patient declined   Family History  Problem Relation Age of Onset   Heart attack Father    Heart disease Neg Hx       VITAL SIGNS BP (!) 102/53   Pulse 67   Ht 4' 10 (1.473 m)   Wt 102 lb (46.3 kg)   SpO2 95%   BMI 21.32 kg/m   Outpatient Encounter Medications as of 10/31/2023  Medication Sig   acetaminophen  (TYLENOL ) 325 MG tablet Take 650 mg by mouth 3 (three) times daily.   Balsam [place]-Castor Oil (VENELEX) OINT Apply 1 Application topically.   diclofenac Sodium (VOLTAREN ARTHRITIS PAIN) 1 % GEL Apply 2 g topically 3 (three) times daily.   methimazole  (TAPAZOLE ) 5 MG tablet Take 5 mg by mouth daily.   methocarbamol  (ROBAXIN ) 500 MG tablet Take 1 tablet (500 mg total) by mouth every 6 (six) hours as needed for muscle spasms.   mirtazapine (REMERON) 7.5 MG tablet Take 7.5 mg by mouth every other day.   NON FORMULARY Diet:  DYSPHAGIA 1 (PUREE) / THIN LIQUIDS only with use of coffee stirrer as straw - requests (2) teas on meal trays and alternating soups on lunch and dinner trays [DX: Dysphagia, oropharyngeal phase]   ondansetron  (ZOFRAN ) 4 MG tablet Take 1 tablet (4 mg total) by mouth every 6 (six) hours as needed for nausea.   OXYGEN  Inhale 2 L into the lungs continuous.  May titrate O2 to 3L/mn prn to maintain sats >89%. Special Instructions: Document O2 sat qshift. [DX: Chronic respiratory failure with hypoxia] Every Shift; Day, Evening, Night   polyethylene glycol (MIRALAX  / GLYCOLAX ) 17 g packet Take 17 g by mouth 2 (two) times daily as needed.   sennosides-docusate sodium  (SENOKOT-S) 8.6-50 MG tablet Take 1 tablet by mouth 2 times daily at 12 noon and 4 pm.   No facility-administered encounter medications on file as of 10/31/2023.     SIGNIFICANT DIAGNOSTIC EXAMS  LABS REVIEWED PREVIOUS     12-21-22: protein 6.1 albumin 2.1 01-23-23: wbc 5.6; hgb 11.7; hct 37.3; mcv 91.9 plt 225; glucose 98; bun 14; creat 0.61; k+ 3.8; na++ 137; ca 8.7; gfr >60; protein  6.5 albumin 3.2   03-20-23: tsh 3.268 03-25-23: wbc 10.8; hgb 10.9; hct 34.4; mcv 92.5 plt 366; glucose 212; bun 27; creat 0.72; k+ 3.9; na++ 141; ca 8.9; gfr >60; tsh 2.981; free t3: 2.7 04-27-23: wbc 9.7; hgb 8.6; hct 27.8; mcv 994.6 plt 240; glucose 137; bun 18; creat 0.52; k+ 4.1; na++ 138; ca 8.1 gfr >60 06-05-23: hgb 11.1; hct 35.4 06-20-23; liver normal protein 6.5 albumin 3.0   NO NEW LABS.    Review of Systems  Unable to perform ROS: Dementia   Physical Exam Constitutional:      General: She is not in acute distress.    Appearance: She is well-developed. She is not diaphoretic.  Neck:     Thyroid : Thyroid  mass and  thyromegaly present.  Cardiovascular:     Rate and Rhythm: Normal rate and regular rhythm.     Heart sounds: Normal heart sounds.  Pulmonary:     Effort: Pulmonary effort is normal. No respiratory distress.     Breath sounds: Normal breath sounds.  Abdominal:     General: Bowel sounds are normal. There is no distension.     Palpations: Abdomen is soft.     Tenderness: There is no abdominal tenderness.  Musculoskeletal:     Cervical back: Neck supple.     Right lower leg: No edema.     Left lower leg: No edema.  Lymphadenopathy:     Cervical: No cervical adenopathy.  Skin:    General: Skin is warm and dry.  Neurological:     Mental Status: She is alert. Mental status is at baseline.  Psychiatric:        Mood and Affect: Mood normal.      ASSESSMENT/ PLAN:  TODAY  Aortic atherosclerosis Chronic respiratory failure with hypoxia Failure to thrive in adult  Will continue current medications Will continue current plan of care Will continue to monitor her status  Time spent with patient: 40 minutes: medications; plan of care; dietary    Barnie Seip NP [facility]   call [phone]

## 2023-11-06 DIAGNOSIS — Z23 Encounter for immunization: Secondary | ICD-10-CM | POA: Diagnosis not present

## 2023-11-12 DIAGNOSIS — H43813 Vitreous degeneration, bilateral: Secondary | ICD-10-CM | POA: Diagnosis not present

## 2023-11-12 DIAGNOSIS — H2513 Age-related nuclear cataract, bilateral: Secondary | ICD-10-CM | POA: Diagnosis not present

## 2023-11-19 DIAGNOSIS — Z23 Encounter for immunization: Secondary | ICD-10-CM | POA: Diagnosis not present

## 2023-11-27 ENCOUNTER — Non-Acute Institutional Stay (SKILLED_NURSING_FACILITY): Payer: Self-pay | Admitting: Adult Health

## 2023-11-27 ENCOUNTER — Encounter: Payer: Self-pay | Admitting: Adult Health

## 2023-11-27 DIAGNOSIS — I209 Angina pectoris, unspecified: Secondary | ICD-10-CM

## 2023-11-27 DIAGNOSIS — M150 Primary generalized (osteo)arthritis: Secondary | ICD-10-CM | POA: Diagnosis not present

## 2023-11-27 DIAGNOSIS — K5909 Other constipation: Secondary | ICD-10-CM

## 2023-11-27 NOTE — Progress Notes (Signed)
 Location:  [facility] Room Number: 115 Place of Service:  SNF (31)   CODE STATUS: dnr   Allergies  Allergen Reactions   Hydrocodone -Acetaminophen  Nausea And Vomiting    Chief Complaint  Patient presents with   Medical Management of Chronic Issues            Chronic constipation:     Angina pectoris unspecified: Primary osteoarthritis of multiple joints:    HPI:  She is a 89 y.o. long term resident of this facility being seen for the management of her chronic illnesses: Chronic constipation:     Angina pectoris unspecified: Primary osteoarthritis of multiple joints. There are no reports of uncontrolled pain. She does get out of bed daily. She continues to slowly lose weight.    Past Medical History:  Diagnosis Date   Anxiety    Dementia (HCC)    GERD (gastroesophageal reflux disease)    Goiter    Hypertension    Major depression, recurrent, chronic 02/22/2019   Osteoarthritis    Osteoporosis    Vitamin D  insufficiency     Past Surgical History:  Procedure Laterality Date   APPENDECTOMY     FEMUR IM NAIL Right 04/25/2023   Procedure: INSERTION, INTRAMEDULLARY ROD, FEMUR, RETROGRADE;  Surgeon: Margrette Taft BRAVO, MD;  Location: [facility];  Service: Orthopedics;  Laterality: Right;  arhtrx nail retrograde femoral   smith nephew dhs removal may be needed; keeling placed yrs ago   HARDWARE REMOVAL Right 04/25/2023   Procedure: REMOVAL, HARDWARE, FEMUR;  Surgeon: Margrette Taft BRAVO, MD;  Location: [facility];  Service: Orthopedics;  Laterality: Right;   INTRAMEDULLARY (IM) NAIL INTERTROCHANTERIC Left 04/23/2023   Procedure: FIXATION, FRACTURE, INTERTROCHANTERIC, WITH INTRAMEDULLARY ROD;  Surgeon: Margrette Taft BRAVO, MD;  Location: [facility];  Service: Orthopedics;  Laterality: Left;   KIDNEY STONE SURGERY     ORIF HIP FRACTURE Right 11/20/2012   Procedure: OPEN REDUCTION INTERNAL FIXATION RIGHT HIP;  Surgeon: Lemond Stable, MD;  Location: [facility];  Service:  Orthopedics;  Laterality: Right;   stress fractures of legs     TUBAL LIGATION      Social History   Socioeconomic History   Marital status: Widowed    Spouse name: Not on file   Number of children: Not on file   Years of education: Not on file   Highest education level: Not on file  Occupational History   Occupation: retired  Tobacco Use   Smoking status: Never   Smokeless tobacco: Never  Vaping Use   Vaping status: Never Used  Substance and Sexual Activity   Alcohol  use: No   Drug use: No   Sexual activity: Not Currently  Other Topics Concern   Not on file  Social History Narrative   Long term resident of [facility]    Social Drivers of Health   Financial Resource Strain: Not on file  Food Insecurity: Patient Declined (04/23/2023)   Hunger Vital Sign    Worried About Running Out of Food in the Last Year: Patient declined    Ran Out of Food in the Last Year: Patient declined  Transportation Needs: Patient Declined (04/23/2023)   PRAPARE - Administrator, Civil Service (Medical): Patient declined    Lack of Transportation (Non-Medical): Patient declined  Physical Activity: Not on file  Stress: Not on file  Social Connections: Patient Declined (04/23/2023)   Social Connection and Isolation Panel    Frequency of Communication with Friends and Family: Patient  declined    Frequency of Social Gatherings with Friends and Family: Patient declined    Attends Religious Services: Patient declined    Active Member of Clubs or Organizations: Patient declined    Attends Banker Meetings: Patient declined    Marital Status: Patient declined  Intimate Partner Violence: Patient Declined (04/23/2023)   Humiliation, Afraid, Rape, and Kick questionnaire    Fear of Current or Ex-Partner: Patient declined    Emotionally Abused: Patient declined    Physically Abused: Patient declined    Sexually Abused: Patient declined   Family History  Problem Relation Age of Onset    Heart attack Father    Heart disease Neg Hx       VITAL SIGNS BP 114/60   Pulse 90   Temp (!) 97 F (36.1 C)   Resp 20   Ht 4' 10 (1.473 m)   Wt 99 lb 8 oz (45.1 kg)   SpO2 96%   BMI 20.80 kg/m   Outpatient Encounter Medications as of 11/27/2023  Medication Sig   acetaminophen  (TYLENOL ) 325 MG tablet Take 650 mg by mouth 3 (three) times daily.   Balsam [place]-Castor Oil (VENELEX) OINT Apply 1 Application topically.   diclofenac Sodium (VOLTAREN ARTHRITIS PAIN) 1 % GEL Apply 2 g topically 3 (three) times daily.   methimazole  (TAPAZOLE ) 5 MG tablet Take 5 mg by mouth daily.   methocarbamol  (ROBAXIN ) 500 MG tablet Take 1 tablet (500 mg total) by mouth every 6 (six) hours as needed for muscle spasms.   mirtazapine (REMERON) 7.5 MG tablet Take 7.5 mg by mouth every other day.   NON FORMULARY Diet:  DYSPHAGIA 1 (PUREE) / THIN LIQUIDS only with use of coffee stirrer as straw - requests (2) teas on meal trays and alternating soups on lunch and dinner trays [DX: Dysphagia, oropharyngeal phase]   ondansetron  (ZOFRAN ) 4 MG tablet Take 1 tablet (4 mg total) by mouth every 6 (six) hours as needed for nausea.   OXYGEN  Inhale 2 L into the lungs continuous.  May titrate O2 to 3L/mn prn to maintain sats >89%. Special Instructions: Document O2 sat qshift. [DX: Chronic respiratory failure with hypoxia] Every Shift; Day, Evening, Night   polyethylene glycol (MIRALAX  / GLYCOLAX ) 17 g packet Take 17 g by mouth 2 (two) times daily as needed.   sennosides-docusate sodium  (SENOKOT-S) 8.6-50 MG tablet Take 1 tablet by mouth 2 times daily at 12 noon and 4 pm.   No facility-administered encounter medications on file as of 11/27/2023.     SIGNIFICANT DIAGNOSTIC EXAMS  LABS REVIEWED PREVIOUS     12-21-22: protein 6.1 albumin 2.1 01-23-23: wbc 5.6; hgb 11.7; hct 37.3; mcv 91.9 plt 225; glucose 98; bun 14; creat 0.61; k+ 3.8; na++ 137; ca 8.7; gfr >60; protein 6.5 albumin 3.2   03-20-23: tsh  3.268 03-25-23: wbc 10.8; hgb 10.9; hct 34.4; mcv 92.5 plt 366; glucose 212; bun 27; creat 0.72; k+ 3.9; na++ 141; ca 8.9; gfr >60; tsh 2.981; free t3: 2.7 04-27-23: wbc 9.7; hgb 8.6; hct 27.8; mcv 994.6 plt 240; glucose 137; bun 18; creat 0.52; k+ 4.1; na++ 138; ca 8.1 gfr >60 06-05-23: hgb 11.1; hct 35.4 06-20-23; liver normal protein 6.5 albumin 3.0   TODAY  09-24-23: wbc 8.5; hgb 12.6; hct 40.5; mcv 94.8 plt 298; glucose 117; bun 25; creat 0.64; k+ 3.6; na++ 147; ca 9.2; gfr >60; protein 6.9 albumin 2.9; tsh 2.267     Review of Systems  Unable to perform  ROS: Dementia    Physical Exam Constitutional:      General: She is not in acute distress.    Appearance: She is well-developed. She is not diaphoretic.  Neck:     Thyroid : Thyroid  mass and thyromegaly present.  Cardiovascular:     Rate and Rhythm: Normal rate and regular rhythm.     Heart sounds: Normal heart sounds.  Pulmonary:     Effort: Pulmonary effort is normal. No respiratory distress.     Breath sounds: Normal breath sounds.  Abdominal:     General: Bowel sounds are normal. There is no distension.     Palpations: Abdomen is soft.     Tenderness: There is no abdominal tenderness.  Musculoskeletal:     Cervical back: Neck supple.     Right lower leg: No edema.     Left lower leg: No edema.  Lymphadenopathy:     Cervical: No cervical adenopathy.  Skin:    General: Skin is warm and dry.  Neurological:     Mental Status: She is alert. Mental status is at baseline.  Psychiatric:        Mood and Affect: Mood normal.      ASSESSMENT/ PLAN:  TODAY  Chronic constipation: will continue senna s twice daily   2. Angina pectoris unspecified: is without chest pain  3. Primary osteoarthritis of multiple joints: will continue tylenol  650 mg three times daily   PREVIOUS   4. Bilateral hip fractures: is status post surgical repair  5. GERD without esophagitis: has history of GIB: is off PPI   6. Vascular dementia  without behavioral disturbance: weight is 99 pounds stable at this time; she continues remeron every other hs.   7. Essential hypertension: b/p 114/60  8. Post menopausal osteoporosis: t score -5.527 is status post bilateral hip fractures.   9. Goiter: tsh 2.267 is on tapazole  5 mg daily   10. Normochromic anemia: hgb 12.6  11. Severe protein calorie malnutrition: albumin 2.9; will continue supplements as directed.     Barnie Seip NP [facility]   call [phone]

## 2023-12-08 DIAGNOSIS — L602 Onychogryphosis: Secondary | ICD-10-CM | POA: Diagnosis not present

## 2023-12-08 DIAGNOSIS — L603 Nail dystrophy: Secondary | ICD-10-CM | POA: Diagnosis not present

## 2023-12-08 DIAGNOSIS — I739 Peripheral vascular disease, unspecified: Secondary | ICD-10-CM | POA: Diagnosis not present

## 2023-12-21 ENCOUNTER — Encounter (HOSPITAL_COMMUNITY)
Admission: RE | Admit: 2023-12-21 | Discharge: 2023-12-21 | Disposition: A | Discharge status: Home / Self Care | Source: Skilled Nursing Facility | Attending: Adult Health | Admitting: Adult Health

## 2023-12-21 DIAGNOSIS — G9341 Metabolic encephalopathy: Secondary | ICD-10-CM | POA: Insufficient documentation

## 2023-12-21 LAB — HEPATIC FUNCTION PANEL
ALT: 7 U/L (ref 0–44)
AST: 17 U/L (ref 15–41)
Albumin: 3 g/dL — ABNORMAL LOW (ref 3.5–5.0)
Alkaline Phosphatase: 54 U/L (ref 38–126)
Bilirubin, Direct: 0.2 mg/dL (ref 0.0–0.2)
Indirect Bilirubin: 0.2 mg/dL — ABNORMAL LOW (ref 0.3–0.9)
Total Bilirubin: 0.4 mg/dL (ref 0.0–1.2)
Total Protein: 5.7 g/dL — ABNORMAL LOW (ref 6.5–8.1)

## 2023-12-23 ENCOUNTER — Encounter: Payer: Self-pay | Admitting: Adult Health

## 2023-12-23 ENCOUNTER — Non-Acute Institutional Stay (SKILLED_NURSING_FACILITY): Payer: Self-pay | Admitting: Adult Health

## 2023-12-23 DIAGNOSIS — F01C0 Vascular dementia, severe, without behavioral disturbance, psychotic disturbance, mood disturbance, and anxiety: Secondary | ICD-10-CM | POA: Diagnosis not present

## 2023-12-23 DIAGNOSIS — I10 Essential (primary) hypertension: Secondary | ICD-10-CM | POA: Diagnosis not present

## 2023-12-23 DIAGNOSIS — K219 Gastro-esophageal reflux disease without esophagitis: Secondary | ICD-10-CM

## 2023-12-23 NOTE — Progress Notes (Unsigned)
 Location:  [facility] Room Number: North/114/W Place of Service:  SNF (31)   CODE STATUS: DNR  Allergies  Allergen Reactions   Hydrocodone -Acetaminophen  Nausea And Vomiting    Chief Complaint  Patient presents with   Medical Management of Chronic Issues         GERD without esophagitis:Vascular dementia without behavioral disturbance: Essential hypertension    HPI:  She is a 89 y.o. long term resident of this facility being seen for the management of her chronic illnesses: GERD without esophagitis:Vascular dementia without behavioral disturbance: Essential hypertension. There are no reports of uncontrolled pain. Her weight remains stable at 101 pounds. There are no reports of behavioral issues.    Past Medical History:  Diagnosis Date   Anxiety    Dementia (HCC)    GERD (gastroesophageal reflux disease)    Goiter    Hypertension    Major depression, recurrent, chronic 02/22/2019   Osteoarthritis    Osteoporosis    Vitamin D  insufficiency     Past Surgical History:  Procedure Laterality Date   APPENDECTOMY     FEMUR IM NAIL Right 04/25/2023   Procedure: INSERTION, INTRAMEDULLARY ROD, FEMUR, RETROGRADE;  Surgeon: Margrette Taft BRAVO, MD;  Location: [facility];  Service: Orthopedics;  Laterality: Right;  arhtrx nail retrograde femoral   smith nephew dhs removal may be needed; keeling placed yrs ago   HARDWARE REMOVAL Right 04/25/2023   Procedure: REMOVAL, HARDWARE, FEMUR;  Surgeon: Margrette Taft BRAVO, MD;  Location: [facility];  Service: Orthopedics;  Laterality: Right;   INTRAMEDULLARY (IM) NAIL INTERTROCHANTERIC Left 04/23/2023   Procedure: FIXATION, FRACTURE, INTERTROCHANTERIC, WITH INTRAMEDULLARY ROD;  Surgeon: Margrette Taft BRAVO, MD;  Location: [facility];  Service: Orthopedics;  Laterality: Left;   KIDNEY STONE SURGERY     ORIF HIP FRACTURE Right 11/20/2012   Procedure: OPEN REDUCTION INTERNAL FIXATION RIGHT HIP;  Surgeon: Lemond Stable, MD;  Location:  [facility];  Service: Orthopedics;  Laterality: Right;   stress fractures of legs     TUBAL LIGATION      Social History   Socioeconomic History   Marital status: Widowed    Spouse name: Not on file   Number of children: Not on file   Years of education: Not on file   Highest education level: Not on file  Occupational History   Occupation: retired  Tobacco Use   Smoking status: Never   Smokeless tobacco: Never  Vaping Use   Vaping status: Never Used  Substance and Sexual Activity   Alcohol  use: No   Drug use: No   Sexual activity: Not Currently  Other Topics Concern   Not on file  Social History Narrative   Long term resident of [facility]    Social Drivers of Health   Financial Resource Strain: Not on file  Food Insecurity: Patient Declined (04/23/2023)   Hunger Vital Sign    Worried About Running Out of Food in the Last Year: Patient declined    Ran Out of Food in the Last Year: Patient declined  Transportation Needs: Patient Declined (04/23/2023)   PRAPARE - Administrator, Civil Service (Medical): Patient declined    Lack of Transportation (Non-Medical): Patient declined  Physical Activity: Not on file  Stress: Not on file  Social Connections: Patient Declined (04/23/2023)   Social Connection and Isolation Panel    Frequency of Communication with Friends and Family: Patient declined    Frequency of Social Gatherings with Friends and Family: Patient declined  Attends Religious Services: Patient declined    Active Member of Clubs or Organizations: Patient declined    Attends Banker Meetings: Patient declined    Marital Status: Patient declined  Intimate Partner Violence: Patient Declined (04/23/2023)   Humiliation, Afraid, Rape, and Kick questionnaire    Fear of Current or Ex-Partner: Patient declined    Emotionally Abused: Patient declined    Physically Abused: Patient declined    Sexually Abused: Patient declined   Family History  Problem  Relation Age of Onset   Heart attack Father    Heart disease Neg Hx       VITAL SIGNS BP 126/72   Pulse 64   Temp 98.1 F (36.7 C)   Resp 20   Ht 4' 10 (1.473 m)   Wt 101 lb 11.2 oz (46.1 kg)   SpO2 96%   BMI 21.26 kg/m   Outpatient Encounter Medications as of 12/23/2023  Medication Sig   acetaminophen  (TYLENOL ) 325 MG tablet Take 650 mg by mouth 3 (three) times daily.   Balsam [place]-Castor Oil (VENELEX) OINT Apply 1 Application topically.   diclofenac Sodium (VOLTAREN ARTHRITIS PAIN) 1 % GEL Apply 2 g topically 3 (three) times daily.   methimazole  (TAPAZOLE ) 5 MG tablet Take 5 mg by mouth daily.   NON FORMULARY Diet:  DYSPHAGIA 1 (PUREE) / THIN LIQUIDS only with use of coffee stirrer as straw - requests (2) teas on meal trays and alternating soups on lunch and dinner trays [DX: Dysphagia, oropharyngeal phase]   ondansetron  (ZOFRAN ) 4 MG tablet Take 1 tablet (4 mg total) by mouth every 6 (six) hours as needed for nausea.   OXYGEN  Inhale 2 L into the lungs continuous.  May titrate O2 to 3L/mn prn to maintain sats >89%. Special Instructions: Document O2 sat qshift. [DX: Chronic respiratory failure with hypoxia] Every Shift; Day, Evening, Night   polyethylene glycol (MIRALAX  / GLYCOLAX ) 17 g packet Take 17 g by mouth 2 (two) times daily as needed.   sennosides-docusate sodium  (SENOKOT-S) 8.6-50 MG tablet Take 1 tablet by mouth 2 times daily at 12 noon and 4 pm.   [DISCONTINUED] methocarbamol  (ROBAXIN ) 500 MG tablet Take 1 tablet (500 mg total) by mouth every 6 (six) hours as needed for muscle spasms. (Patient not taking: Reported on 12/23/2023)   [DISCONTINUED] mirtazapine (REMERON) 7.5 MG tablet Take 7.5 mg by mouth every other day. (Patient not taking: Reported on 12/23/2023)   No facility-administered encounter medications on file as of 12/23/2023.     SIGNIFICANT DIAGNOSTIC EXAMS  LABS REVIEWED PREVIOUS     12-21-22: protein 6.1 albumin 2.1 01-23-23: wbc 5.6; hgb 11.7; hct  37.3; mcv 91.9 plt 225; glucose 98; bun 14; creat 0.61; k+ 3.8; na++ 137; ca 8.7; gfr >60; protein 6.5 albumin 3.2   03-20-23: tsh 3.268 03-25-23: wbc 10.8; hgb 10.9; hct 34.4; mcv 92.5 plt 366; glucose 212; bun 27; creat 0.72; k+ 3.9; na++ 141; ca 8.9; gfr >60; tsh 2.981; free t3: 2.7 04-27-23: wbc 9.7; hgb 8.6; hct 27.8; mcv 994.6 plt 240; glucose 137; bun 18; creat 0.52; k+ 4.1; na++ 138; ca 8.1 gfr >60 06-05-23: hgb 11.1; hct 35.4 06-20-23; liver normal protein 6.5 albumin 3.0  09-24-23: wbc 8.5; hgb 12.6; hct 40.5; mcv 94.8 plt 298; glucose 117; bun 25; creat 0.64; k+ 3.6; na++ 147; ca 9.2; gfr >60; protein 6.9 albumin 2.9; tsh 2.267    NO NEW LABS    Review of Systems  Unable to perform ROS: Dementia  Physical Exam Constitutional:      General: She is not in acute distress.    Appearance: She is well-developed. She is not diaphoretic.  Neck:     Thyroid : No thyromegaly.     Comments: goiter Cardiovascular:     Rate and Rhythm: Normal rate and regular rhythm.     Heart sounds: Normal heart sounds.  Pulmonary:     Effort: Pulmonary effort is normal. No respiratory distress.     Breath sounds: Normal breath sounds.  Abdominal:     General: Bowel sounds are normal. There is no distension.     Palpations: Abdomen is soft.     Tenderness: There is no abdominal tenderness.  Musculoskeletal:     Cervical back: Neck supple.     Right lower leg: No edema.     Left lower leg: No edema.  Lymphadenopathy:     Cervical: No cervical adenopathy.  Skin:    General: Skin is warm and dry.  Neurological:     Mental Status: She is alert. Mental status is at baseline.  Psychiatric:        Mood and Affect: Mood normal.       ASSESSMENT/ PLAN:  TODAY  GERD without esophagitis: has history of GIB is off ppi  2. Vascular dementia without behavioral disturbance: weight is 101 pounds; is off remeron.   3. Essential hypertension: b/p 126/72  PREVIOUS   4. Post menopausal osteoporosis:  t score -5.527 is status post bilateral hip fractures.   5. Goiter: tsh 2.267 is on tapazole  5 mg daily   6. Normochromic anemia: hgb 12.6  7. Severe protein calorie malnutrition: albumin 2.9; will continue supplements as directed.   8. Chronic constipation: will continue senna s twice daily   9. Angina pectoris unspecified: is without chest pain  10. Primary osteoarthritis of multiple joints: will continue tylenol  650 mg three times daily      Barnie Seip NP [facility]  call [phone]

## 2024-01-27 ENCOUNTER — Non-Acute Institutional Stay (SKILLED_NURSING_FACILITY): Payer: Self-pay | Admitting: Adult Health

## 2024-01-27 ENCOUNTER — Encounter: Payer: Self-pay | Admitting: Adult Health

## 2024-01-27 DIAGNOSIS — N182 Chronic kidney disease, stage 2 (mild): Secondary | ICD-10-CM

## 2024-01-27 DIAGNOSIS — I10 Essential (primary) hypertension: Secondary | ICD-10-CM | POA: Diagnosis not present

## 2024-01-27 DIAGNOSIS — J9611 Chronic respiratory failure with hypoxia: Secondary | ICD-10-CM

## 2024-01-27 NOTE — Progress Notes (Signed)
 " Location:  [facility] Room Number: 116 Place of Service:  SNF (31)   CODE STATUS: dnr   Allergies[1]  Chief Complaint  Patient presents with   Acute Visit    Care plan meeting     HPI:  We have come together for her care plan meeting. No BIMS; mood 0/30. Spends most of time in bed; without falls. She is dependent assist with her adl care. She is incontinent of bladder and bowel. Dietary; requires assist with meals; appetite 26-50%; puree diet; weight is 98 pounds. Therapy: none at this time. Activities 1:1. She will continue to be followed for her chronic illnesses including: Primary hypertension   Chronic respiratory failure with hypoxia    CKD (chronic kidney disease) stage 2    Past Medical History:  Diagnosis Date   Anxiety    Dementia (HCC)    GERD (gastroesophageal reflux disease)    Goiter    Hypertension    Major depression, recurrent, chronic 02/22/2019   Osteoarthritis    Osteoporosis    Vitamin D  insufficiency     Past Surgical History:  Procedure Laterality Date   APPENDECTOMY     FEMUR IM NAIL Right 04/25/2023   Procedure: INSERTION, INTRAMEDULLARY ROD, FEMUR, RETROGRADE;  Surgeon: Margrette Taft BRAVO, MD;  Location: [facility];  Service: Orthopedics;  Laterality: Right;  arhtrx nail retrograde femoral   smith nephew dhs removal may be needed; keeling placed yrs ago   HARDWARE REMOVAL Right 04/25/2023   Procedure: REMOVAL, HARDWARE, FEMUR;  Surgeon: Margrette Taft BRAVO, MD;  Location: [facility];  Service: Orthopedics;  Laterality: Right;   INTRAMEDULLARY (IM) NAIL INTERTROCHANTERIC Left 04/23/2023   Procedure: FIXATION, FRACTURE, INTERTROCHANTERIC, WITH INTRAMEDULLARY ROD;  Surgeon: Margrette Taft BRAVO, MD;  Location: [facility];  Service: Orthopedics;  Laterality: Left;   KIDNEY STONE SURGERY     ORIF HIP FRACTURE Right 11/20/2012   Procedure: OPEN REDUCTION INTERNAL FIXATION RIGHT HIP;  Surgeon: Lemond Stable, MD;  Location: [facility];  Service:  Orthopedics;  Laterality: Right;   stress fractures of legs     TUBAL LIGATION      Social History   Socioeconomic History   Marital status: Widowed    Spouse name: Not on file   Number of children: Not on file   Years of education: Not on file   Highest education level: Not on file  Occupational History   Occupation: retired  Tobacco Use   Smoking status: Never   Smokeless tobacco: Never  Vaping Use   Vaping status: Never Used  Substance and Sexual Activity   Alcohol  use: No   Drug use: No   Sexual activity: Not Currently  Other Topics Concern   Not on file  Social History Narrative   Long term resident of [facility]    Social Drivers of Health   Tobacco Use: Low Risk (01/27/2024)   Patient History    Smoking Tobacco Use: Never    Smokeless Tobacco Use: Never    Passive Exposure: Not on file  Financial Resource Strain: Not on file  Food Insecurity: Patient Declined (04/23/2023)   Hunger Vital Sign    Worried About Running Out of Food in the Last Year: Patient declined    Ran Out of Food in the Last Year: Patient declined  Transportation Needs: Patient Declined (04/23/2023)   PRAPARE - Administrator, Civil Service (Medical): Patient declined    Lack of Transportation (Non-Medical): Patient declined  Physical Activity: Not  on file  Stress: Not on file  Social Connections: Patient Declined (04/23/2023)   Social Connection and Isolation Panel    Frequency of Communication with Friends and Family: Patient declined    Frequency of Social Gatherings with Friends and Family: Patient declined    Attends Religious Services: Patient declined    Active Member of Clubs or Organizations: Patient declined    Attends Banker Meetings: Patient declined    Marital Status: Patient declined  Intimate Partner Violence: Patient Declined (04/23/2023)   Humiliation, Afraid, Rape, and Kick questionnaire    Fear of Current or Ex-Partner: Patient declined    Emotionally  Abused: Patient declined    Physically Abused: Patient declined    Sexually Abused: Patient declined  Depression (PHQ2-9): Low Risk (01/22/2023)   Depression (PHQ2-9)    PHQ-2 Score: 0  Alcohol  Screen: Not on file  Housing: Patient Declined (04/23/2023)   Housing Stability Vital Sign    Unable to Pay for Housing in the Last Year: Patient declined    Number of Times Moved in the Last Year: 0    Homeless in the Last Year: Patient declined  Utilities: Patient Declined (04/23/2023)   [facility] Utilities    Threatened with loss of utilities: Patient declined  Health Literacy: Not on file   Family History  Problem Relation Age of Onset   Heart attack Father    Heart disease Neg Hx       VITAL SIGNS BP 121/72   Pulse 74   Temp 98.6 F (37 C)   Resp 20   Ht 4' 10 (1.473 m)   Wt 98 lb (44.5 kg)   SpO2 98%   BMI 20.48 kg/m   Outpatient Encounter Medications as of 01/27/2024  Medication Sig   acetaminophen  (TYLENOL ) 325 MG tablet Take 650 mg by mouth 3 (three) times daily.   Balsam [place]-Castor Oil (VENELEX) OINT Apply 1 Application topically.   diclofenac Sodium (VOLTAREN ARTHRITIS PAIN) 1 % GEL Apply 2 g topically 3 (three) times daily.   methimazole  (TAPAZOLE ) 5 MG tablet Take 5 mg by mouth daily.   NON FORMULARY Diet:  DYSPHAGIA 1 (PUREE) / THIN LIQUIDS only with use of coffee stirrer as straw - requests (2) teas on meal trays and alternating soups on lunch and dinner trays [DX: Dysphagia, oropharyngeal phase]   ondansetron  (ZOFRAN ) 4 MG tablet Take 1 tablet (4 mg total) by mouth every 6 (six) hours as needed for nausea.   OXYGEN  Inhale 2 L into the lungs continuous.  May titrate O2 to 3L/mn prn to maintain sats >89%. Special Instructions: Document O2 sat qshift. [DX: Chronic respiratory failure with hypoxia] Every Shift; Day, Evening, Night   polyethylene glycol (MIRALAX  / GLYCOLAX ) 17 g packet Take 17 g by mouth 2 (two) times daily as needed.   sennosides-docusate sodium  (SENOKOT-S)  8.6-50 MG tablet Take 1 tablet by mouth 2 times daily at 12 noon and 4 pm.   No facility-administered encounter medications on file as of 01/27/2024.     SIGNIFICANT DIAGNOSTIC EXAMS  LABS REVIEWED PREVIOUS     01-23-23: wbc 5.6; hgb 11.7; hct 37.3; mcv 91.9 plt 225; glucose 98; bun 14; creat 0.61; k+ 3.8; na++ 137; ca 8.7; gfr >60; protein 6.5 albumin 3.2   03-20-23: tsh 3.268 03-25-23: wbc 10.8; hgb 10.9; hct 34.4; mcv 92.5 plt 366; glucose 212; bun 27; creat 0.72; k+ 3.9; na++ 141; ca 8.9; gfr >60; tsh 2.981; free t3: 2.7 04-27-23: wbc 9.7; hgb 8.6;  hct 27.8; mcv 994.6 plt 240; glucose 137; bun 18; creat 0.52; k+ 4.1; na++ 138; ca 8.1 gfr >60 06-05-23: hgb 11.1; hct 35.4 06-20-23; liver normal protein 6.5 albumin 3.0  09-24-23: wbc 8.5; hgb 12.6; hct 40.5; mcv 94.8 plt 298; glucose 117; bun 25; creat 0.64; k+ 3.6; na++ 147; ca 9.2; gfr >60; protein 6.9 albumin 2.9; tsh 2.267    NO NEW LABS    Review of Systems  Unable to perform ROS: Dementia     Physical Exam Constitutional:      General: She is not in acute distress.    Appearance: She is underweight. She is not diaphoretic.  Neck:     Thyroid : No thyromegaly.     Comments: goiter Cardiovascular:     Rate and Rhythm: Normal rate and regular rhythm.     Heart sounds: Normal heart sounds.  Pulmonary:     Effort: Pulmonary effort is normal. No respiratory distress.     Breath sounds: Normal breath sounds.  Abdominal:     General: Bowel sounds are normal. There is no distension.     Palpations: Abdomen is soft.     Tenderness: There is no abdominal tenderness.  Musculoskeletal:     Cervical back: Neck supple.     Right lower leg: No edema.     Left lower leg: No edema.  Lymphadenopathy:     Cervical: No cervical adenopathy.  Skin:    General: Skin is warm and dry.  Neurological:     Mental Status: She is alert. Mental status is at baseline.  Psychiatric:        Mood and Affect: Mood normal.     ASSESSMENT/  PLAN:  TODAY  Primary hypertension Chronic respiratory failure with hypoxia CKD (chronic kidney disease) stage 2   Will continue current medications Will continue current plan of care Will continue to monitor her status   Time spent with patient: 40 minutes: medications; dietary; plan of care.    Barnie Seip NP [facility]   call [phone]     [1]  Allergies Allergen Reactions   Hydrocodone -Acetaminophen  Nausea And Vomiting   "

## 2024-01-31 ENCOUNTER — Other Ambulatory Visit (HOSPITAL_COMMUNITY)
Admission: RE | Admit: 2024-01-31 | Discharge: 2024-01-31 | Disposition: A | Source: Skilled Nursing Facility | Attending: Adult Health | Admitting: Adult Health

## 2024-01-31 DIAGNOSIS — I10 Essential (primary) hypertension: Secondary | ICD-10-CM | POA: Diagnosis present

## 2024-01-31 LAB — BASIC METABOLIC PANEL WITH GFR
Anion gap: 5 (ref 5–15)
BUN: 17 mg/dL (ref 8–23)
CO2: 31 mmol/L (ref 22–32)
Calcium: 8.9 mg/dL (ref 8.9–10.3)
Chloride: 105 mmol/L (ref 98–111)
Creatinine, Ser: 0.51 mg/dL (ref 0.44–1.00)
GFR, Estimated: >60 mL/min
Glucose, Bld: 106 mg/dL — ABNORMAL HIGH (ref 70–99)
Potassium: 3.6 mmol/L (ref 3.5–5.1)
Sodium: 140 mmol/L (ref 135–145)

## 2024-02-02 ENCOUNTER — Encounter: Payer: Self-pay | Admitting: Adult Health

## 2024-02-02 ENCOUNTER — Non-Acute Institutional Stay (SKILLED_NURSING_FACILITY): Payer: Self-pay | Admitting: Adult Health

## 2024-02-02 DIAGNOSIS — E049 Nontoxic goiter, unspecified: Secondary | ICD-10-CM | POA: Diagnosis not present

## 2024-02-02 DIAGNOSIS — D649 Anemia, unspecified: Secondary | ICD-10-CM | POA: Diagnosis not present

## 2024-02-02 DIAGNOSIS — M810 Age-related osteoporosis without current pathological fracture: Secondary | ICD-10-CM

## 2024-02-02 NOTE — Progress Notes (Signed)
 " Location:  [facility] Room Number: 114 Place of Service:  SNF (31)   CODE STATUS: dnr   Allergies[1]  Chief Complaint  Patient presents with   Medical Management of Chronic Issues             Post menopausal osteoporosis: Goiter: Normochromic anemia    HPI:  She is a 89 y.o. long term resident of this facility being seen for the management of her chronic illnesses:Post menopausal osteoporosis: Goiter: Normochromic anemia. No reports of uncontrolled pain. She continues to get out of bed daily. She has lost weight with her current weight of 98 pounds.    Past Medical History:  Diagnosis Date   Anxiety    Dementia (HCC)    GERD (gastroesophageal reflux disease)    Goiter    Hypertension    Major depression, recurrent, chronic 02/22/2019   Osteoarthritis    Osteoporosis    Vitamin D  insufficiency     Past Surgical History:  Procedure Laterality Date   APPENDECTOMY     FEMUR IM NAIL Right 04/25/2023   Procedure: INSERTION, INTRAMEDULLARY ROD, FEMUR, RETROGRADE;  Surgeon: Margrette Taft BRAVO, MD;  Location: [facility];  Service: Orthopedics;  Laterality: Right;  arhtrx nail retrograde femoral   smith nephew dhs removal may be needed; keeling placed yrs ago   HARDWARE REMOVAL Right 04/25/2023   Procedure: REMOVAL, HARDWARE, FEMUR;  Surgeon: Margrette Taft BRAVO, MD;  Location: [facility];  Service: Orthopedics;  Laterality: Right;   INTRAMEDULLARY (IM) NAIL INTERTROCHANTERIC Left 04/23/2023   Procedure: FIXATION, FRACTURE, INTERTROCHANTERIC, WITH INTRAMEDULLARY ROD;  Surgeon: Margrette Taft BRAVO, MD;  Location: [facility];  Service: Orthopedics;  Laterality: Left;   KIDNEY STONE SURGERY     ORIF HIP FRACTURE Right 11/20/2012   Procedure: OPEN REDUCTION INTERNAL FIXATION RIGHT HIP;  Surgeon: Lemond Stable, MD;  Location: [facility];  Service: Orthopedics;  Laterality: Right;   stress fractures of legs     TUBAL LIGATION      Social History   Socioeconomic History    Marital status: Widowed    Spouse name: Not on file   Number of children: Not on file   Years of education: Not on file   Highest education level: Not on file  Occupational History   Occupation: retired  Tobacco Use   Smoking status: Never   Smokeless tobacco: Never  Vaping Use   Vaping status: Never Used  Substance and Sexual Activity   Alcohol  use: No   Drug use: No   Sexual activity: Not Currently  Other Topics Concern   Not on file  Social History Narrative   Long term resident of [facility]    Social Drivers of Health   Tobacco Use: Low Risk (02/02/2024)   Patient History    Smoking Tobacco Use: Never    Smokeless Tobacco Use: Never    Passive Exposure: Not on file  Financial Resource Strain: Not on file  Food Insecurity: Patient Declined (04/23/2023)   Hunger Vital Sign    Worried About Running Out of Food in the Last Year: Patient declined    Barista in the Last Year: Patient declined  Transportation Needs: Patient Declined (04/23/2023)   PRAPARE - Administrator, Civil Service (Medical): Patient declined    Lack of Transportation (Non-Medical): Patient declined  Physical Activity: Not on file  Stress: Not on file  Social Connections: Patient Declined (04/23/2023)   Social Connection and Isolation Panel  Frequency of Communication with Friends and Family: Patient declined    Frequency of Social Gatherings with Friends and Family: Patient declined    Attends Religious Services: Patient declined    Active Member of Clubs or Organizations: Patient declined    Attends Banker Meetings: Patient declined    Marital Status: Patient declined  Intimate Partner Violence: Patient Declined (04/23/2023)   Humiliation, Afraid, Rape, and Kick questionnaire    Fear of Current or Ex-Partner: Patient declined    Emotionally Abused: Patient declined    Physically Abused: Patient declined    Sexually Abused: Patient declined  Depression (PHQ2-9): Low  Risk (01/22/2023)   Depression (PHQ2-9)    PHQ-2 Score: 0  Alcohol  Screen: Not on file  Housing: Patient Declined (04/23/2023)   Housing Stability Vital Sign    Unable to Pay for Housing in the Last Year: Patient declined    Number of Times Moved in the Last Year: 0    Homeless in the Last Year: Patient declined  Utilities: Patient Declined (04/23/2023)   [facility] Utilities    Threatened with loss of utilities: Patient declined  Health Literacy: Not on file   Family History  Problem Relation Age of Onset   Heart attack Father    Heart disease Neg Hx       VITAL SIGNS BP 95/64   Pulse 72   Temp (!) 96.8 F (36 C)   Resp 20   Ht 4' 10 (1.473 m)   Wt 98 lb (44.5 kg)   SpO2 98%   BMI 20.48 kg/m   Outpatient Encounter Medications as of 02/02/2024  Medication Sig   acetaminophen  (TYLENOL ) 325 MG tablet Take 650 mg by mouth 3 (three) times daily.   Balsam [place]-Castor Oil (VENELEX) OINT Apply 1 Application topically.   diclofenac Sodium (VOLTAREN ARTHRITIS PAIN) 1 % GEL Apply 2 g topically 3 (three) times daily.   methimazole  (TAPAZOLE ) 5 MG tablet Take 5 mg by mouth daily.   NON FORMULARY Diet:  DYSPHAGIA 1 (PUREE) / THIN LIQUIDS only with use of coffee stirrer as straw - requests (2) teas on meal trays and alternating soups on lunch and dinner trays [DX: Dysphagia, oropharyngeal phase]   ondansetron  (ZOFRAN ) 4 MG tablet Take 1 tablet (4 mg total) by mouth every 6 (six) hours as needed for nausea.   OXYGEN  Inhale 2 L into the lungs continuous.  May titrate O2 to 3L/mn prn to maintain sats >89%. Special Instructions: Document O2 sat qshift. [DX: Chronic respiratory failure with hypoxia] Every Shift; Day, Evening, Night   polyethylene glycol (MIRALAX  / GLYCOLAX ) 17 g packet Take 17 g by mouth 2 (two) times daily as needed.   sennosides-docusate sodium  (SENOKOT-S) 8.6-50 MG tablet Take 1 tablet by mouth 2 times daily at 12 noon and 4 pm.   No facility-administered encounter medications  on file as of 02/02/2024.     SIGNIFICANT DIAGNOSTIC EXAMS  LABS REVIEWED PREVIOUS     01-23-23: wbc 5.6; hgb 11.7; hct 37.3; mcv 91.9 plt 225; glucose 98; bun 14; creat 0.61; k+ 3.8; na++ 137; ca 8.7; gfr >60; protein 6.5 albumin 3.2   03-20-23: tsh 3.268 03-25-23: wbc 10.8; hgb 10.9; hct 34.4; mcv 92.5 plt 366; glucose 212; bun 27; creat 0.72; k+ 3.9; na++ 141; ca 8.9; gfr >60; tsh 2.981; free t3: 2.7 04-27-23: wbc 9.7; hgb 8.6; hct 27.8; mcv 994.6 plt 240; glucose 137; bun 18; creat 0.52; k+ 4.1; na++ 138; ca 8.1 gfr >60 06-05-23: hgb  11.1; hct 35.4 06-20-23; liver normal protein 6.5 albumin 3.0  09-24-23: wbc 8.5; hgb 12.6; hct 40.5; mcv 94.8 plt 298; glucose 117; bun 25; creat 0.64; k+ 3.6; na++ 147; ca 9.2; gfr >60; protein 6.9 albumin 2.9; tsh 2.267    NO NEW LABS    Review of Systems  Unable to perform ROS: Dementia    Physical Exam Constitutional:      General: She is not in acute distress.    Appearance: She is underweight. She is not diaphoretic.  Neck:     Thyroid : No thyromegaly.     Comments: goiter Cardiovascular:     Rate and Rhythm: Normal rate and regular rhythm.     Heart sounds: Normal heart sounds.  Pulmonary:     Effort: Pulmonary effort is normal. No respiratory distress.     Breath sounds: Normal breath sounds.  Abdominal:     General: Bowel sounds are normal. There is no distension.     Palpations: Abdomen is soft.     Tenderness: There is no abdominal tenderness.  Musculoskeletal:        General: Normal range of motion.     Right lower leg: No edema.     Left lower leg: No edema.  Lymphadenopathy:     Cervical: No cervical adenopathy.  Skin:    General: Skin is warm and dry.  Neurological:     Mental Status: She is alert. Mental status is at baseline.  Psychiatric:        Mood and Affect: Mood normal.       ASSESSMENT/ PLAN:  TODAY  Post menopausal osteoporosis: t score -5.527 is status post bilateral hip fractures  2. Goiter: tsh 2.267  is on tapazole  5 mg daily   3. Normochromic anemia: hgb 12.6   PREVIOUS   4. Severe protein calorie malnutrition: albumin 2.9; will continue supplements as directed.   5. Chronic constipation: will continue senna s twice daily   6. Angina pectoris unspecified: is without chest pain  7. Primary osteoarthritis of multiple joints: will continue tylenol  650 mg three times daily   8. GERD without esophagitis: has history of GIB is off ppi  9. Vascular dementia without behavioral disturbance: weight is 98 pounds; is off remeron.   10. Essential hypertension: b/p 95/64     Barnie Seip NP [facility]   call [phone]      [1]  Allergies Allergen Reactions   Hydrocodone -Acetaminophen  Nausea And Vomiting   "

## 2024-02-17 ENCOUNTER — Encounter: Payer: Self-pay | Admitting: Internal Medicine

## 2024-02-17 ENCOUNTER — Non-Acute Institutional Stay (SKILLED_NURSING_FACILITY): Payer: Self-pay | Admitting: Internal Medicine

## 2024-02-17 DIAGNOSIS — E43 Unspecified severe protein-calorie malnutrition: Secondary | ICD-10-CM | POA: Diagnosis not present

## 2024-02-17 DIAGNOSIS — E0590 Thyrotoxicosis, unspecified without thyrotoxic crisis or storm: Secondary | ICD-10-CM | POA: Diagnosis not present

## 2024-02-17 DIAGNOSIS — F01C0 Vascular dementia, severe, without behavioral disturbance, psychotic disturbance, mood disturbance, and anxiety: Secondary | ICD-10-CM

## 2024-02-17 NOTE — Progress Notes (Signed)
"  ° °  NURSING HOME LOCATION:  [facility] ROOM NUMBER: [id number]  CODE STATUS: DNR  PCP: Landy Barnie RAMAN, NP   This is a nursing facility follow up visit of chronic medical diagnoses to document compliance with Regulation 483.30 (c) in The Long Term Care Survey Manual Phase 2 which mandates caregiver visit ( visits can alternate among physician, PA or NP as per statutes) within 10 days of 30 days / 60 days/ 90 days post admission to SNF date  .  Interim medical record and care since last SNF visit was updated with review of diagnostic studies and change in clinical status since last visit were documented.  HPI: She is a permanent resident at this facility with medical diagnoses of GERD; history of goiter; essential hypertension; recurrent depression; osteoporosis; history of nephrolithiasis; vitamin D  deficiency; and dementia.  Labs are current as of 01/31/2024.  Mild hyperglycemia was present with a value of 106.  Protein/caloric malnutrition was present with albumin 3.0 and total protein of 5.7. She is on Tapazole ; the most recent TSH was therapeutic at 2.267 on 09/24/2023.   Review of systems: Dementia invalidated responses.  She was essentially nonverbal and could not follow commands.  She did initially state that she was very, very well.  She also confabulated you are much for my head.   Physical exam:  Pertinent or positive findings: Facies tend to be blank.  She is wearing nasal oxygen .  Her voice is very weak.  She is missing multiple mandibular teeth with caries and erosions.  The maxilla appears to be edentulous but she cannot follow command to open her mouth widely.  Large goiter is visible over the anterior neck.  Heart sounds are distant; rhythm is clinically slightly irregular.  She has low-grade expiratory rhonchi.  Pedal pulses are not palpable.  She has a resting tremor of the right hand.  General appearance: no acute distress, increased work of breathing is  present.   Lymphatic: No lymphadenopathy about the head, neck, axilla. Eyes: No conjunctival inflammation or lid edema is present. There is no scleral icterus. Ears:  External ear exam shows no significant lesions or deformities.   Nose:  External nasal examination shows no deformity or inflammation. Nasal mucosa are pink and moist without lesions, exudates Neck:  No thyromegaly, masses, tenderness noted.    Heart:  No gallop, murmur, click, rub .  Lungs:  without wheezes, rales, rubs. Abdomen: Bowel sounds are normal. Abdomen is soft and nontender with no organomegaly, hernias, masses. GU: Deferred  Extremities:  No cyanosis, clubbing, edema  Neurologic exam :Balance, Rhomberg, finger to nose testing could not be completed due to clinical state Skin: Warm & dry w/o tenting. No significant lesions or rash.  See summary under each active problem in the Problem List with associated updated therapeutic plan :  Protein-calorie malnutrition Current albumin 3.0 and total protein 5.7.  Dementia precludes adherence to optimal nutritional intake.  Severe vascular dementia without behavioral disturbance, psychotic disturbance, mood disturbance, or anxiety (HCC) She was essentially nonverbal but did confabulate.  No behavioral issues as per staff.  Continue to monitor.  Hyperthyroidism, subclinical She remains on Tapazole ; TSH was therapeutic at 2.267.  No change clinically in the goiter.  Rhythm is clinically slightly irregular but rate is not accelerated.  Continue to monitor.   "

## 2024-02-17 NOTE — Patient Instructions (Signed)
 See assessment and plan under each diagnosis in the problem list and acutely for this visit

## 2024-02-17 NOTE — Assessment & Plan Note (Signed)
 Current albumin 3.0 and total protein 5.7.  Dementia precludes adherence to optimal nutritional intake.

## 2024-02-17 NOTE — Assessment & Plan Note (Addendum)
 She remains on Tapazole ; TSH was therapeutic at 2.267.  No change clinically in the goiter.  Rhythm is clinically slightly irregular but rate is not accelerated.  Continue to monitor.

## 2024-02-17 NOTE — Assessment & Plan Note (Signed)
 She was essentially nonverbal but did confabulate.  No behavioral issues as per staff.  Continue to monitor.
